# Patient Record
Sex: Male | Born: 1960 | Race: White | Hispanic: No | Marital: Single | Smoking: Former smoker
Health system: Southern US, Community
[De-identification: ages and names within clinical notes are randomized; demographics above are authoritative.]

## PROBLEM LIST (undated history)

## (undated) DIAGNOSIS — I639 Cerebral infarction, unspecified: Secondary | ICD-10-CM

## (undated) DIAGNOSIS — F32A Depression, unspecified: Secondary | ICD-10-CM

## (undated) DIAGNOSIS — G4700 Insomnia, unspecified: Secondary | ICD-10-CM

## (undated) DIAGNOSIS — N429 Disorder of prostate, unspecified: Secondary | ICD-10-CM

## (undated) DIAGNOSIS — A419 Sepsis, unspecified organism: Secondary | ICD-10-CM

## (undated) DIAGNOSIS — E871 Hypo-osmolality and hyponatremia: Secondary | ICD-10-CM

## (undated) DIAGNOSIS — J439 Emphysema, unspecified: Secondary | ICD-10-CM

## (undated) DIAGNOSIS — Z8619 Personal history of other infectious and parasitic diseases: Secondary | ICD-10-CM

## (undated) DIAGNOSIS — Z972 Presence of dental prosthetic device (complete) (partial): Secondary | ICD-10-CM

## (undated) DIAGNOSIS — J189 Pneumonia, unspecified organism: Secondary | ICD-10-CM

## (undated) DIAGNOSIS — I739 Peripheral vascular disease, unspecified: Secondary | ICD-10-CM

## (undated) DIAGNOSIS — J302 Other seasonal allergic rhinitis: Secondary | ICD-10-CM

## (undated) DIAGNOSIS — R569 Unspecified convulsions: Secondary | ICD-10-CM

## (undated) DIAGNOSIS — R636 Underweight: Secondary | ICD-10-CM

## (undated) DIAGNOSIS — F329 Major depressive disorder, single episode, unspecified: Secondary | ICD-10-CM

## (undated) DIAGNOSIS — I10 Essential (primary) hypertension: Secondary | ICD-10-CM

## (undated) DIAGNOSIS — G629 Polyneuropathy, unspecified: Secondary | ICD-10-CM

## (undated) DIAGNOSIS — Z22322 Carrier or suspected carrier of Methicillin resistant Staphylococcus aureus: Secondary | ICD-10-CM

## (undated) DIAGNOSIS — R278 Other lack of coordination: Secondary | ICD-10-CM

## (undated) HISTORY — DX: Emphysema, unspecified: J43.9

## (undated) HISTORY — DX: Personal history of other infectious and parasitic diseases: Z86.19

## (undated) HISTORY — DX: Major depressive disorder, single episode, unspecified: F32.9

## (undated) HISTORY — DX: Depression, unspecified: F32.A

## (undated) HISTORY — DX: Disorder of prostate, unspecified: N42.9

## (undated) HISTORY — DX: Unspecified convulsions: R56.9

## (undated) HISTORY — PX: VASCULAR SURGERY: SHX849

## (undated) HISTORY — PX: FRACTURE SURGERY: SHX138

## (undated) HISTORY — DX: Underweight: R63.6

## (undated) HISTORY — DX: Other seasonal allergic rhinitis: J30.2

## (undated) HISTORY — DX: Essential (primary) hypertension: I10

---

## 2008-01-26 ENCOUNTER — Ambulatory Visit: Payer: Self-pay | Admitting: Vascular Surgery

## 2008-03-06 ENCOUNTER — Emergency Department: Payer: Self-pay | Admitting: Emergency Medicine

## 2008-06-05 ENCOUNTER — Ambulatory Visit: Payer: Self-pay | Admitting: Internal Medicine

## 2008-06-07 ENCOUNTER — Ambulatory Visit: Payer: Self-pay | Admitting: Vascular Surgery

## 2008-09-04 ENCOUNTER — Ambulatory Visit: Payer: Self-pay | Admitting: Neurology

## 2008-11-07 ENCOUNTER — Ambulatory Visit: Payer: Self-pay | Admitting: Neurology

## 2011-05-30 ENCOUNTER — Inpatient Hospital Stay: Payer: Self-pay | Admitting: Internal Medicine

## 2011-09-18 ENCOUNTER — Ambulatory Visit: Payer: Self-pay | Admitting: Adult Health

## 2011-11-18 ENCOUNTER — Ambulatory Visit: Payer: Self-pay | Admitting: Internal Medicine

## 2011-11-18 ENCOUNTER — Inpatient Hospital Stay: Payer: Self-pay | Admitting: Internal Medicine

## 2011-11-18 LAB — CBC
MCH: 32.5 pg (ref 26.0–34.0)
MCV: 98 fL (ref 80–100)
Platelet: 185 10*3/uL (ref 150–440)
RBC: 4.41 10*6/uL (ref 4.40–5.90)
RDW: 15.1 % — ABNORMAL HIGH (ref 11.5–14.5)
WBC: 7.4 10*3/uL (ref 3.8–10.6)

## 2011-11-18 LAB — URINALYSIS, COMPLETE
Bacteria: NONE SEEN
Blood: NEGATIVE
Glucose,UR: NEGATIVE mg/dL (ref 0–75)
Ketone: NEGATIVE
Leukocyte Esterase: NEGATIVE
Nitrite: NEGATIVE
Ph: 7 (ref 4.5–8.0)
RBC,UR: <1 /HPF (ref 0–5)

## 2011-11-18 LAB — COMPREHENSIVE METABOLIC PANEL
Albumin: 3.9 g/dL (ref 3.4–5.0)
Alkaline Phosphatase: 109 U/L (ref 50–136)
BUN: 7 mg/dL (ref 7–18)
Bilirubin,Total: 0.9 mg/dL (ref 0.2–1.0)
Calcium, Total: 9.1 mg/dL (ref 8.5–10.1)
Creatinine: 0.9 mg/dL (ref 0.60–1.30)
Glucose: 104 mg/dL — ABNORMAL HIGH (ref 65–99)
Osmolality: 233 (ref 275–301)
Potassium: 2.7 mmol/L — ABNORMAL LOW (ref 3.5–5.1)
SGPT (ALT): 28 U/L
Sodium: 116 mmol/L — CL (ref 136–145)
Total Protein: 8.6 g/dL — ABNORMAL HIGH (ref 6.4–8.2)

## 2011-11-18 LAB — MAGNESIUM: Magnesium: 1.8 mg/dL

## 2011-11-19 LAB — CBC WITH DIFFERENTIAL/PLATELET
Basophil #: 0 10*3/uL (ref 0.0–0.1)
Basophil %: 0.4 %
Eosinophil %: 1.1 %
HCT: 36.8 % — ABNORMAL LOW (ref 40.0–52.0)
Lymphocyte #: 0.9 10*3/uL — ABNORMAL LOW (ref 1.0–3.6)
Lymphocyte %: 21.3 %
MCV: 98 fL (ref 80–100)
Monocyte %: 17.8 %
Neutrophil #: 2.6 10*3/uL (ref 1.4–6.5)
Neutrophil %: 59.4 %
Platelet: 164 10*3/uL (ref 150–440)
RBC: 3.78 10*6/uL — ABNORMAL LOW (ref 4.40–5.90)
RDW: 14.9 % — ABNORMAL HIGH (ref 11.5–14.5)
WBC: 4.3 10*3/uL (ref 3.8–10.6)

## 2011-11-19 LAB — COMPREHENSIVE METABOLIC PANEL
Albumin: 2.9 g/dL — ABNORMAL LOW (ref 3.4–5.0)
Alkaline Phosphatase: 80 U/L (ref 50–136)
Bilirubin,Total: 0.8 mg/dL (ref 0.2–1.0)
Calcium, Total: 8.1 mg/dL — ABNORMAL LOW (ref 8.5–10.1)
Co2: 23 mmol/L (ref 21–32)
EGFR (African American): >60
EGFR (Non-African Amer.): >60
Glucose: 109 mg/dL — ABNORMAL HIGH (ref 65–99)
Osmolality: 255 (ref 275–301)
SGOT(AST): 27 U/L (ref 15–37)
SGPT (ALT): 22 U/L
Sodium: 128 mmol/L — ABNORMAL LOW (ref 136–145)

## 2011-11-20 LAB — LIPID PANEL
Cholesterol: 68 mg/dL (ref 0–200)
Ldl Cholesterol, Calc: 28 mg/dL (ref 0–100)

## 2011-11-20 LAB — CBC WITH DIFFERENTIAL/PLATELET
Basophil #: 0 10*3/uL (ref 0.0–0.1)
Basophil %: 0.5 %
Eosinophil %: 2 %
HCT: 37.8 % — ABNORMAL LOW (ref 40.0–52.0)
HGB: 12.8 g/dL — ABNORMAL LOW (ref 13.0–18.0)
Lymphocyte %: 24.5 %
MCH: 32.9 pg (ref 26.0–34.0)
Monocyte %: 17.2 %
Neutrophil #: 2.6 10*3/uL (ref 1.4–6.5)
Platelet: 201 10*3/uL (ref 150–440)
RBC: 3.88 10*6/uL — ABNORMAL LOW (ref 4.40–5.90)
WBC: 4.6 10*3/uL (ref 3.8–10.6)

## 2011-11-20 LAB — BASIC METABOLIC PANEL
BUN: 1 mg/dL — ABNORMAL LOW (ref 7–18)
Calcium, Total: 8.5 mg/dL (ref 8.5–10.1)
Creatinine: 0.57 mg/dL — ABNORMAL LOW (ref 0.60–1.30)
EGFR (African American): >60
EGFR (Non-African Amer.): >60
Glucose: 82 mg/dL (ref 65–99)
Osmolality: 256 (ref 275–301)
Potassium: 4 mmol/L (ref 3.5–5.1)

## 2013-05-01 ENCOUNTER — Ambulatory Visit: Payer: Self-pay | Admitting: Internal Medicine

## 2013-09-07 HISTORY — PX: EYE SURGERY: SHX253

## 2013-10-02 ENCOUNTER — Ambulatory Visit: Payer: Self-pay

## 2014-02-15 ENCOUNTER — Ambulatory Visit: Payer: Self-pay | Admitting: Internal Medicine

## 2014-02-28 ENCOUNTER — Ambulatory Visit: Payer: Self-pay | Admitting: Ophthalmology

## 2014-02-28 DIAGNOSIS — I10 Essential (primary) hypertension: Secondary | ICD-10-CM

## 2014-02-28 DIAGNOSIS — Z01810 Encounter for preprocedural cardiovascular examination: Secondary | ICD-10-CM

## 2014-03-14 ENCOUNTER — Ambulatory Visit: Payer: Self-pay | Admitting: Ophthalmology

## 2014-03-21 ENCOUNTER — Ambulatory Visit: Payer: Self-pay | Admitting: Internal Medicine

## 2014-04-24 ENCOUNTER — Ambulatory Visit: Payer: Self-pay | Admitting: Internal Medicine

## 2014-05-15 ENCOUNTER — Ambulatory Visit: Payer: Self-pay | Admitting: Internal Medicine

## 2014-12-29 NOTE — Op Note (Signed)
PATIENT NAME:  Donald [name], Donald [name] MR#:  [id number] DATE OF BIRTH:  03-26-1961  DATE OF PROCEDURE:  03/14/2014  LOCATION: [facility]   PREOPERATIVE DIAGNOSIS:  Senile cataract, right eye with miotic pupil.  POSTOPERATIVE DIAGNOSIS:  Senile cataract, right eye with miotic pupil.  PROCEDURE:  Phacoemulsification with posterior chamber intraocular lens placement which required pupil stretching with the Graether pupil expansion device.   LENS IMPLANT:  ZCBOO 23.0-diopter.   ULTRASOUND TIME:   16 % of 52 seconds for CDE of 8.5.    SURGEON:  Wyonia Hough, MD  ANESTHESIA:  Retrobulbar block of Xylocaine and Bupivacaine and Vitrase.   COMPLICATIONS:  None.  DESCRIPTION OF PROCEDURE:  The patient was identified in the holding room, transported to the operating room, and placed in the supine position.  The right eye was identified as the operative eye and a retrobulbar block was administered under intravenous sedation.  It was then prepped and draped in the usual sterile ophthalmic fashion.  The eye was inspected and the pupil was noted to be between 4 millimeters with maximal pharmacologic dilation.  A 1 millimeter side-port incision was made at the 1:30 position.  The anterior chamber was filled with Viscoat.  A 2.4 millimeter near-clear corneal incision was made at the 10:30 position.  A Graether pupil expander was then placed through the main incision and into the anterior chamber of the eye.  The edge of the iris was secured on the lip of the pupil expander and it was released, thereby expanding the pupil to approximately 8 millimeters for completion of the cataract surgery.  A cystotome and capsulorrhexis forceps were used to make a curvilinear capsulorrhexis.   Balanced salt solution was used to hydrodissect and hydrodelineate the lens nucleus.  Phacoemulsification was used in stop and chop fashion to remove the lens, nucleus and epinucleus.  The remaining cortex was  aspirated using the irrigation aspiration handpiece.  Additional Provisc was placed into the eye to distend the capsular bag for lens placement.  A ZCBOO 23.0-diopter lens was then injected into the capsular bag.  The remaining viscoelastic was aspirated from the capsular bag and the anterior chamber. The pupil expanding ring was removed using a Kuglen hook.  The anterior chamber was filled with balanced salt solution to inflate to a physiologic pressure.    The wounds were hydrated with balanced salt solution.  Miostat was placed into the anterior chamber. 0.1 mL of cefuroxime 10 mg/mL were injected into the anterior chamber for a dose of 1 mg of intracameral antibiotic at the completion of the case.  The eye was noted to have a physiologic pressure without wound leak. Topical Vigamox drops and Maxitrol ointment were applied to the eye.  The eye was shielded.   The patient was taken to the recovery room in stable condition.  ____________________________ Wyonia Hough, MD crb:aj D: 03/14/2014 13:29:37 ET T: 03/15/2014 04:16:39 ET JOB#: [id number]  cc: Wyonia Hough, MD, <Dictator> Leandrew Koyanagi MD ELECTRONICALLY SIGNED 03/21/2014 12:41

## 2014-12-30 NOTE — H&P (Signed)
PATIENT NAME:  Donald [name], Donald [name] MR#:  [id number] DATE OF BIRTH:  [birth year]  DATE OF ADMISSION:  11/18/2011  PRIMARY CARE PHYSICIAN: [facility]   HISTORY OF PRESENT ILLNESS: The patient is a 54 year old Caucasian male with past medical history significant for history of alcohol abuse, history of hyponatremia in the past, history of alcohol intoxication, and admission for possible seizure in September 2012 who presented back to the hospital with complaints of nausea and vomiting intermittently as well as abnormal labs. According to the patient, he was not doing well over the past few days. He has been having intermittent nausea and vomiting as well as diarrhea a few days ago. He has been coughing and feeling sick, having intermittent chills. He was seen by his primary care physician yesterday at [facility] who ordered lab studies and sent him to get chest x-ray. He had chest x-ray done at [facility] and after he returned back home he was called by his primary care physician's office and was told that his labs were abnormal showing low potassium as well as low sodium levels and the patient was advised to come to the Emergency Room for further evaluation. In the Emergency Room, the patient's sodium level is as low as 116 and hospitalist services were contacted for admission.   PAST MEDICAL HISTORY:  1. History of alcohol intoxication, questionable seizures in September 2012. 2. History of hyponatremia at the same admission with sodium levels as low as 128.  3. History of hypertension. 4. Tobacco abuse. 5. Peripheral vascular disease. 6. History of atherosclerotic disease of lower extremities status post multiple stent placements as well as TPA of aorta, bilateral common iliac artery, right external iliac artery status post stent placement in these vessels as well.  7. Degenerative disk disease in L4-L5.   MEDICATIONS:  1. The patient is not sure about his two blood  pressure medications he is supposed to take.  2. Amoxicillin, unknown dose, 3 times daily.  3. Omeprazole, unknown dose or frequency.  4. Aspirin 81 mg p.o. daily.   SOCIAL HISTORY: Lives alone. Is on disability due to degenerative disk disease and severe peripheral vascular disease. Admits to smoking approximately 1 pack per day for the past 30 years. Admits of drinking at least a 6 pack of beer a day. Last use was three days ago. The patient states that he is trying to cut down with his smoking. He is now down to one-third of pack of cigarettes a day.   FAMILY HISTORY: The patient's family history is positive for hypertension in the patient's mother, diabetes in the patient's grandparents. No history of coronary artery disease or CVA.   ALLERGIES: No known drug allergies   REVIEW OF SYSTEMS: Positive for pains in his back, some blurring of vision for which he uses reading glasses, sinus congestion, yellow phlegm which he started having today. In the past he has been coughing without any phlegm production and now since he started on antibiotic he started producing some sputum. He admits of wheezing as well as dyspnea on exertion. Also, intermittent nausea and vomiting as well as diarrhea three days ago. He admits of having some trouble with urination. He has difficulty expelling urine. Denies any fevers, chills, fatigue, weakness, weight loss or gain. In regards to eyes, denies any double vision, glaucoma, or cataracts. ENT: Denies any tinnitus, allergies, epistaxis, sinus pain, dentures, difficulty swallowing. RESPIRATORY: Admits of wheezes. Denies any hemoptysis, asthma, or COPD. CARDIOVASCULAR: Denies chest  pains, orthopnea, edema, arrhythmias, palpitations, or syncope. GASTROINTESTINAL: Denies any diarrhea recently over the past two days. No abdominal pain. No rectal bleeding. No change in bowel habits. GENITOURINARY: Denies dysuria, hematuria, frequency, or incontinence. ENDOCRINOLOGY: Denies any  polydipsia, nocturia, thyroid problems, heat or cold intolerance, or thirst. HEMATOLOGIC: Denies any anemia, bruising, bleeding, or swollen glands. SKIN: Denies any acne, rashes, lesions, or change in moles. MUSCULOSKELETAL: Denies arthritis, cramps, swelling, or gout. NEUROLOGIC: No numbness, epilepsy, or tremor. PSYCHIATRIC: Denies anxiety, insomnia, or depression.   PHYSICAL EXAMINATION:   VITAL SIGNS: On arrival to hospital, temperature 97.5, pulse 102, respiration rate 20, blood pressure 143/88, saturation 99% on room air.   GENERAL: This is a well developed, well nourished Caucasian male in no significant distress somewhat pale and sick looking sitting on the stretcher.   HEENT: Pupils are equal and reactive to light. Extraocular movements intact. No icterus or conjunctivitis. Has normal hearing. No pharyngeal erythema. Mucosa is moist.   NECK: Neck did not reveal any masses. Supple, nontender. Thyroid not enlarged. No adenopathy. No JVD or carotid bruits bilaterally. Full range of motion.   LUNGS: Clear to auscultation on the left; crackles as well as rales and rhonchi were noted on the left especially posteriorly in lower part of the lungs. Few rhonchi bilaterally was noted as well. No wheezing. No labored inspirations, increased effort, dullness to percussion, or overt respiratory distress.   CARDIOVASCULAR: S1, S2 appreciated. No murmurs, gallops, or rubs were noted. PMI not lateralized. Chest is nontender to palpation. Diminished pedal pulses. No lower extremity edema, calf tenderness, or cyanosis noted.   ABDOMEN: Soft. No significant tenderness. No hepatosplenomegaly or masses were noted.   RECTAL: Deferred.   MUSCULOSKELETAL: Muscle strength able to move all extremities. No significant degenerative joint disease or kyphosis. Gait not tested.   SKIN: Skin did not reveal any rashes, lesions, erythema, nodularity, or induration. It was warm and dry to palpation.   LYMPH: No  adenopathy in the cervical region.   NEUROLOGICAL: Cranial nerves grossly intact. Sensory is intact. No dysarthria or aphasia. The patient is alert, oriented to time, person, and place, cooperative. Memory is good.   PSYCHIATRIC: No significant confusion, agitation, or depression noted.   LABORATORY, DIAGNOSTIC, AND RADIOLOGICAL DATA: Sodium 116, potassium 2.7, glucose 104, otherwise unremarkable BMP. The patient's liver enzymes showed total protein 8.6, otherwise liver enzymes unremarkable. CBC within normal limits. Urinalysis showed straw clear urine, negative for glucose, bilirubin, or ketones, specific gravity 1.002, pH of 7.0, negative for blood, protein, nitrites, or leukocyte esterase, less than 1 red blood cell, no white blood cells, no bacteria or epithelial cells were noted. No EKG was done.     ASSESSMENT AND PLAN:  1. Hyponatremia very likely due to intermittent nausea and vomiting as well as dehydration due to diarrhea. Admit patient to medical floor. Continue him on IV fluids. Check sodium level in the morning.   2. Intermittent nausea, vomiting, questionable alcoholic versus nonsteroidal anti-inflammatory medications such as aspirin gastritis. We will be holding the patient's aspirin. Will continue the patient on PPIs for now. Will follow and will start him on a soft diet. Advance to regular diet as tolerated.  3. Diarrhea, seems to be resolved. Last diarrheal stool was two days ago. Will get stool cultures if needed. I am concerned about possible exophrenic dysfunction of his pancreas due to longstanding history of alcohol abuse.  4. History of alcohol abuse. Last drink was three days ago. Will watch patient and initiate  CIWA scale if needed. Check magnesium level.  5. Tobacco abuse. Discussed with the patient for six minutes. Nicotine replacement therapy will be initiated while he is in the hospital.  6. Acute bronchitis. Start patient on Levaquin. Get sputum cultures.  7. Chronic  obstructive pulmonary disease exacerbation. Continue DuoNebs.  8. Suspected benign prostatic hypertrophy. Start Flomax.  9. History of hypertension. Will start patient on Flomax. Will also initiate metoprolol.   TIME SPENT: 50 minutes.   ____________________________ Theodoro Grist, MD rv:drc D: 11/18/2011 21:47:34 ET T: 11/19/2011 07:57:13 ET JOB#: [id number]  cc: Theodoro Grist, MD, <Dictator> [facility] Wiggins MD ELECTRONICALLY SIGNED 11/28/2011 10:07

## 2014-12-30 NOTE — Discharge Summary (Signed)
PATIENT NAME:  Donald [name], Donald [name] MR#:  [id number] DATE OF BIRTH:  [birth year]  DATE OF ADMISSION:  11/18/2011 DATE OF DISCHARGE:  11/20/2011  PRIMARY CARE PHYSICIAN: [facility]   REASON FOR ADMISSION: Nausea, vomiting, diarrhea, and abnormal blood work.   DISCHARGE DIAGNOSES:  1. Nausea, vomiting, and diarrhea likely secondary to acute viral gastroenteritis. 2. Acute viral gastroenteritis.  3. Hyponatremia felt to be multifactorial from hypovolemia caused by vomiting and dehydration. Vomiting, diarrhea, and dehydration plus beer potomania plus hydrochlorothiazide induced.  4. Hypokalemia.  5. Probable chronic obstructive pulmonary disease based on smoking history and suggested by chest x-ray findings.  6. History of hypertension.  7. History of tobacco abuse.  8. History of alcohol abuse.  9. History of peripheral vascular disease with stents in the iliac vessels 2009.  10. History of acute alcohol intoxication and possible seizures.   CONSULTATIONS: None.   PROCEDURES: None.   PERTINENT LABORATORY DATA: Serum sodium 116 with potassium 2.7 on admission. Serum sodium 130, potassium 4 on the day of discharge.   LFTs normal on admission except for total protein of 8.6.   CBC normal on admission.   Urinalysis on admission with specific gravity 1.002, negative nitrite, leukocyte esterase. No  WBCs and no bacteria seen.   BRIEF HISTORY /HOSPITAL COURSE: The patient is a 54 year old male with past medical history of alcohol abuse, hospitalization for acute alcohol intoxication and possible related seizures, hyponatremia from alcohol abuse, hypertension, peripheral vascular disease, and tobacco abuse who presents to the Emergency Department with complaints of nausea, vomiting, and diarrhea and was noted to have abnormal labs as an outpatient, noted to be hyponatremic, and thereafter was sent to the hospital for further evaluation and management. Please see dictated admission History and  Physical for pertinent details surrounding the onset of this hospitalization. Please see below for further details. 1. Nausea, vomiting, and diarrhea: Felt to be secondary to acute viral gastroenteritis for which the patient was admitted to the hospital and placed on supportive measures and hydrated with IV fluids and placed on antiemetics. His diarrhea had eventually resolved by the time he was admitted and did not recur. Therefore, stool studies were not obtained. He was initially started on a clear liquid diet. With supportive care and conservative measures he had resolution of his vomiting and nausea during his hospital course. Thereafter his diet was slowly advanced and he is tolerating a solid diet well at the time of discharge without any nausea, vomiting, diarrhea, or abdominal pain.  2. Hyponatremia: Felt to be multifactorial from hypovolemia caused by vomiting, diarrhea, and dehydration, plus beer potomania, plus HCTZ. HCTZ has been discontinued. He was hydrated gently with IV normal saline and thereafter serum sodium level has significantly improved. He was advised to undergo alcohol detox and gradually wean himself off alcohol with goal for eventual cessation and to maintain sobriety, but he does not appear motivated to stop drinking at this time and stated to me that in all likelihood he will continue consuming alcohol when he returns home. He was without any suicidal or homicidal ideation. Serum sodium levels have improved with the measures mentioned above and this will need to be close monitored as an outpatient.  3. Hypokalemia:  Likely wasting from alcohol abuse plus vomiting and diarrhea and also HCTZ-induced. After potassium chloride supplementation his serum potassium level has normalized. His serum magnesium level is within normal limits. 4. Probable chronic obstructive pulmonary disease based off smoking history and suggested by outpatient chest x-ray  findings: Oxygen saturations are normal  on room air. The patient denied any shortness of breath or wheezing. He has been prescribed p.r.n. albuterol metered dose inhaler and I have offered him a referral to pulmonology as an outpatient for further testing and work-up and PFTs, but he states to me that he is not interested in outpatient pulmonology evaluation or referral at this time as he stated that he would not be able to afford this given his current financial status. He was advised that if he develops any worsening shortness of breath, cough, fever, chills, or wheezing that he should let his primary care physician know immediately. For now we will defer referring him to pulmonology as an outpatient to his primary care physician as per the patient's request.  5. Hypertension:  He did have some hypertensive episodes noted during this hospitalization for which his antihypertensive regimen has been adjusted. HCTZ has been discontinued given hyponatremia and hypokalemia. The patient was not volume overloaded and therefore requires diuretic at this time. If he continues to drink alcohol, which he stated to me that he would, and continues to take HCTZ he will likely be readmitted with hyponatremia.  Therefore, this medication has been discontinued for now. He was started on metoprolol instead.  He was also maintained on lisinopril and blood pressure is well controlled on the date of discharge with these two medications. He will need close outpatient followup in regards to blood pressure management. 6. Tobacco abuse:  The patient was counseled on the importance of smoking cessation.  7. Alcohol abuse: The patient was counseled again to be detoxed off of alcohol and thereafter abstain from alcohol abuse completely and to maintain sobriety.  However, as above he does not appear to be motivated at this time.  He was maintained on thiamine, folate, and multivitamin therapy along with withdrawal and DT prophylaxis with CIWA during this hospitalization.  On the  day of discharge he was non-tremulous and non-agitated and without any asterixis. He was also given thiamine, folate, and multivitamin during this hospitalization.  8. History of peripheral vascular disease with stents to the iliac vessels 2010 - Patient to continue Aspirin therapy.  LDL is at goal.  He denied any claudication or lower extremity pain at rest or with activity and stated that he was not interested in followup with vascular surgery as an outpatient.   On 11/20/2011 the patient was hemodynamically stable and without any nausea, vomiting, or diarrhea. Serum sodium has significantly improved and is almost back to normal, and serum potassium level has normalized. The patient was adamant about being discharged home on 11/20/2011 and thereafter he was discharged and was advised to maintain close outpatient followup, to which the patient was agreeable.   DISCHARGE DISPOSITION: Home.   DISCHARGE CONDITION: Improved, stable.  DISCHARGE ACTIVITY:  As tolerated.    DISCHARGE DIET: Low fat, low cholesterol.   DISCHARGE MEDICATIONS: 1. Aspirin 81 mg daily.  2. Metoprolol 25 mg p.o. every 12 hours.  3. Lisinopril 10 mg p.o. daily. 4. Albuterol metered dose inhaler 1 to 2 puffs inhaled every 4 to 6 hours p.r.n.  5. Multivitamin 1 tablet p.o. daily.   DISCHARGE INSTRUCTIONS:  1. Take medication as prescribed.  2. Return to the Emergency Department for numbness, tingling, weakness, or feeling faint, for slurred speech or confusion.   FOLLOWUP INSTRUCTIONS:  Follow up with [facility] within 1 to 2 weeks. The patient needs repeat BMP within one week and repeat blood pressure check within  one week.    TIME SPENT ON DISCHARGE:  Greater than 30 minutes.    ____________________________ Romie Jumper, MD knl:bjt D: 11/24/2011 21:56:47 ET T: 11/25/2011 11:42:59 ET JOB#: [id number]  cc: Romie Jumper, MD, <Dictator> [facility] Romie Jumper MD ELECTRONICALLY SIGNED  11/29/2011 16:33

## 2015-01-10 ENCOUNTER — Other Ambulatory Visit: Payer: Self-pay

## 2015-01-23 ENCOUNTER — Other Ambulatory Visit: Payer: Self-pay

## 2015-02-06 ENCOUNTER — Ambulatory Visit: Payer: Self-pay | Admitting: Ophthalmology

## 2015-02-20 ENCOUNTER — Ambulatory Visit: Payer: Self-pay | Admitting: Internal Medicine

## 2015-03-13 ENCOUNTER — Ambulatory Visit: Payer: Self-pay

## 2015-03-20 ENCOUNTER — Ambulatory Visit: Payer: Self-pay

## 2015-06-20 ENCOUNTER — Encounter: Payer: Self-pay | Admitting: Family Medicine

## 2015-06-20 ENCOUNTER — Ambulatory Visit (INDEPENDENT_AMBULATORY_CARE_PROVIDER_SITE_OTHER): Payer: Medicaid Other | Admitting: Family Medicine

## 2015-06-20 VITALS — BP 123/88 | HR 89 | Temp 98.2°F | Resp 16 | Ht 69.0 in | Wt 112.8 lb

## 2015-06-20 DIAGNOSIS — J432 Centrilobular emphysema: Secondary | ICD-10-CM | POA: Insufficient documentation

## 2015-06-20 DIAGNOSIS — Z720 Tobacco use: Secondary | ICD-10-CM

## 2015-06-20 DIAGNOSIS — N400 Enlarged prostate without lower urinary tract symptoms: Secondary | ICD-10-CM | POA: Diagnosis not present

## 2015-06-20 DIAGNOSIS — I10 Essential (primary) hypertension: Secondary | ICD-10-CM | POA: Diagnosis not present

## 2015-06-20 DIAGNOSIS — Z87891 Personal history of nicotine dependence: Secondary | ICD-10-CM | POA: Insufficient documentation

## 2015-06-20 DIAGNOSIS — I739 Peripheral vascular disease, unspecified: Secondary | ICD-10-CM | POA: Insufficient documentation

## 2015-06-20 DIAGNOSIS — F17200 Nicotine dependence, unspecified, uncomplicated: Secondary | ICD-10-CM

## 2015-06-20 DIAGNOSIS — J449 Chronic obstructive pulmonary disease, unspecified: Secondary | ICD-10-CM | POA: Diagnosis not present

## 2015-06-20 MED ORDER — METOPROLOL SUCCINATE ER 50 MG PO TB24: 50.0000 mg | ORAL_TABLET | Freq: Every day | ORAL | Status: DC

## 2015-06-20 MED ORDER — AMLODIPINE BESYLATE 5 MG PO TABS: 5.0000 mg | ORAL_TABLET | Freq: Every day | ORAL | Status: DC

## 2015-06-20 MED ORDER — SILODOSIN 4 MG PO CAPS: 4.0000 mg | ORAL_CAPSULE | Freq: Every day | ORAL | Status: DC

## 2015-06-20 NOTE — Progress Notes (Signed)
Date:  06/20/2015   Name:  Donald [name].   DOB:  1961/01/06   MRN:  [id number]  PCP:  No primary care provider on file.    Chief Complaint: Hypertension   History of Present Illness:  This is a 54 y.o. male former patient at [facility] now on Medicaid went to refill meds and [facility] MD not registered with Medicaid so pharmacy could not fill. Planning to make appt with NP here next month. Hx HTN well controlled on current meds and BPH well controlled on Rapaflo (has increased problems urinating if doesn't take). 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 Denies known CAD or carotid dz. Has COPD per records on albuterol MDI which he uses only occasionally. Still smoking though told to quit. Falls intermittently due to BLE nerve damage.  Review of Systems:  Review of Systems  Constitutional: Negative for fever.  Respiratory: Negative for cough and shortness of breath.   Cardiovascular: Negative for chest pain and leg swelling.  Genitourinary: Negative for difficulty urinating.  Neurological: Negative for light-headedness.    Patient Active Problem List   Diagnosis Date Noted  . Hypertension 06/20/2015  . BPH (benign prostatic hypertrophy) 06/20/2015  . PAD (peripheral artery disease) ([place]) 06/20/2015  . Smoker 06/20/2015  . COPD (chronic obstructive pulmonary disease) ([place]) 06/20/2015    Prior to Admission medications   Medication Sig Start Date End Date Taking? Authorizing Provider  albuterol (PROVENTIL HFA;VENTOLIN HFA) 108 (90 BASE) MCG/ACT inhaler Inhale into the lungs every 6 (six) hours as needed for wheezing or shortness of breath.   Yes Historical Provider, MD  amLODipine (NORVASC) 5 MG tablet Take 1 tablet (5 mg total) by mouth daily. 06/20/15  Yes Adline Potter, MD  metoprolol tartrate (LOPRESSOR) 25 MG tablet Take 25 mg by mouth 2 (two) times daily.   Yes Historical Provider, MD  silodosin (RAPAFLO) 4 MG CAPS capsule Take  1 capsule (4 mg total) by mouth daily with breakfast. 06/20/15  Yes Adline Potter, MD  metoprolol succinate (TOPROL-XL) 50 MG 24 hr tablet Take 1 tablet (50 mg total) by mouth daily. 06/20/15   Adline Potter, MD    No Known Allergies  Past Surgical History  Procedure Laterality Date  . None      Social History  Substance Use Topics  . Smoking status: Current Every Day Smoker -- 0.50 packs/day    Types: Cigarettes  . Smokeless tobacco: None  . Alcohol Use: 0.0 oz/week    0 Standard drinks or equivalent per week    Family History  Problem Relation Age of Onset  . Ulcers Father     bleeding    Medication list has been reviewed and updated.  Physical Examination: BP 123/88 mmHg  Pulse 89  Temp(Src) 98.2 F (36.8 C) (Oral)  Resp 16  Ht 5\' 9"  (1.753 m)  Wt 112 lb 12.8 oz (51.166 kg)  BMI 16.65 kg/m2  Physical Exam  Constitutional: He appears well-developed and well-nourished.  Neck: Neck supple. No thyromegaly present.  Cardiovascular: Normal rate, regular rhythm and normal heart sounds.   Pulmonary/Chest: Breath sounds normal.  Abdominal: Soft. He exhibits no distension and no mass. There is no tenderness.  Musculoskeletal: He exhibits no edema.  Lymphadenopathy:    He has no cervical adenopathy.  Neurological: He is alert. Coordination normal.  Skin: Skin is warm and dry.  Psychiatric: He has a normal mood and affect. His behavior  is normal.    Assessment and Plan:  1. Essential hypertension Well controlled on current regimen, change metoprolol tartrate to succinate for simplified dosing - amLODipine (NORVASC) 5 MG tablet; Take 1 tablet (5 mg total) by mouth daily.  Dispense: 90 tablet; Refill: 3 - metoprolol succinate (TOPROL-XL) 50 MG 24 hr tablet; Take 1 tablet (50 mg total) by mouth daily.  Dispense: 90 tablet; Refill: 3  2. BPH (benign prostatic hypertrophy) Well controlled on current regimen - silodosin (RAPAFLO) 4 MG CAPS capsule; Take 1 capsule (4 mg  total) by mouth daily with breakfast.  Dispense: 90 capsule; Refill: 3  3. PAD (peripheral artery disease) ([place]) S/p multiple BLE stents, consider asa if no contraindication next visit  4. Chronic obstructive pulmonary disease, unspecified COPD type ([place]) Well controlled on prn albuterol MDI  5. Smoker Advised cessation  Return in about 4 weeks (around 07/18/2015).  Satira Anis. [facility]  06/20/2015

## 2015-06-26 ENCOUNTER — Other Ambulatory Visit: Payer: Self-pay

## 2015-07-03 ENCOUNTER — Ambulatory Visit: Payer: Self-pay | Admitting: Internal Medicine

## 2015-07-29 ENCOUNTER — Ambulatory Visit (INDEPENDENT_AMBULATORY_CARE_PROVIDER_SITE_OTHER): Payer: Medicaid Other | Admitting: Family Medicine

## 2015-07-29 ENCOUNTER — Encounter: Payer: Self-pay | Admitting: Family Medicine

## 2015-07-29 VITALS — BP 113/80 | HR 88 | Temp 97.9°F | Resp 16 | Ht 69.0 in | Wt 115.4 lb

## 2015-07-29 DIAGNOSIS — F17200 Nicotine dependence, unspecified, uncomplicated: Secondary | ICD-10-CM

## 2015-07-29 DIAGNOSIS — I10 Essential (primary) hypertension: Secondary | ICD-10-CM

## 2015-07-29 DIAGNOSIS — I739 Peripheral vascular disease, unspecified: Secondary | ICD-10-CM

## 2015-07-29 DIAGNOSIS — G6289 Other specified polyneuropathies: Secondary | ICD-10-CM

## 2015-07-29 DIAGNOSIS — N400 Enlarged prostate without lower urinary tract symptoms: Secondary | ICD-10-CM | POA: Diagnosis not present

## 2015-07-29 DIAGNOSIS — G629 Polyneuropathy, unspecified: Secondary | ICD-10-CM | POA: Insufficient documentation

## 2015-07-29 DIAGNOSIS — J449 Chronic obstructive pulmonary disease, unspecified: Secondary | ICD-10-CM

## 2015-07-29 DIAGNOSIS — Z720 Tobacco use: Secondary | ICD-10-CM | POA: Diagnosis not present

## 2015-07-29 MED ORDER — SILODOSIN 4 MG PO CAPS: 4.0000 mg | ORAL_CAPSULE | Freq: Every day | ORAL | Status: DC

## 2015-07-29 MED ORDER — ASPIRIN 81 MG PO TABS: 81.0000 mg | ORAL_TABLET | Freq: Every day | ORAL | Status: DC

## 2015-07-29 MED ORDER — TAMSULOSIN HCL 0.4 MG PO CAPS: 0.4000 mg | ORAL_CAPSULE | Freq: Every day | ORAL | Status: DC

## 2015-07-29 NOTE — Patient Instructions (Signed)
Please seek immediate medical attention if you develop shortness of breath not relieve by inhaler, chest pain/tightness, fever > 103 F or other concerning symptoms.   Your goal blood pressure is 140/90. Work on low salt/sodium diet - goal <1.5gm (1,500mg ) per day. Eat a diet high in fruits/vegetables and whole grains.  Look into mediterranean and DASH diet. Goal activity is 136min/wk of moderate intensity exercise.  This can be split into 30 minute chunks.  If you are not at this level, you can start with smaller 10-15 min increments and slowly build up activity. Look at [facility]org for more resources  Please seek immediate medical attention at ER or Urgent Care if you develop: Chest pain, pressure or tightness. Shortness of breath accompanied by nausea or diaphoresis Visual changes Numbness or tingling on one side of the body Facial droop Altered mental status Or any concerning symptoms.

## 2015-07-29 NOTE — Assessment & Plan Note (Addendum)
Encouraged smoking cessation today. Referred to [place] [facility]. Offered nicotine patches, patient refused. >10 minutes smoking cessation counseling provided today.

## 2015-07-29 NOTE — Assessment & Plan Note (Signed)
2/2 PAD?  Pt states he was getting injections in back. Will review previous records to determine how best to treat.  Consider gabapentin in future.

## 2015-07-29 NOTE — Assessment & Plan Note (Signed)
Check lipid panel today. Recommend starting baby aspirin. Encouraged smoking cessation.  After reading records, refer to [facility] V[facility] as necessary.

## 2015-07-29 NOTE — Assessment & Plan Note (Signed)
Spirometry done today. Pt has refills on inhalers.  Alarm symptoms reviewed. RTC 3 mos.

## 2015-07-29 NOTE — Assessment & Plan Note (Signed)
Controlled. Continue current medications. CMP ordered. DASH diet reviewed. Smoking cessation encouarged. RTC 3 mos.

## 2015-07-29 NOTE — Assessment & Plan Note (Addendum)
Change to Flomax due to cost. Alarm symptoms reviewed. Consider referral to urology if symptoms worsen.

## 2015-07-29 NOTE — Progress Notes (Signed)
Subjective:    Patient ID: Donald Handy., male    DOB: Jul 31, 1961, 54 y.o.   MRN: [id number]  HPI: Donald Benak. is a 54 y.o. male presenting on 07/29/2015 for Establish Care   HPI  Pt presents for a visit to officially establish care today. He saw Dr. Vicente Masson for a sick visit previously.  Pt presents to establish care today. Previous care provider was [facility].  It has been 2 mossince  his last PCP visit. Records from previous provider will be requested and reviewed. Current medical problems include:  Emphysema: Diagnosed 3 years ago. Uses albuterol inhaler occasionally. Current everyday smoker. Has tried to quit smoking in the past. Has cut back.  Hypertension: Taking amlodipine and metoprolol. Diagnosed several years ago. No HA, visual changes, or chest pain. Does not check BP at home.  BPH: Takes Rapaflo to help with urinary symptoms. Wakes up in the middle of night 1-2 times per day. Has trouble voiding unless he takes rapaflo. PAD: 7 stents in his legs for PAD. 3 in each leg and a kidney. Does not take baby aspirin. Intermittent claudication. 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   Health maintenance:  Never had a colonoscopy. Would like one. Smoker- 1/2 pack per day for 30 years.    Past Medical History  Diagnosis Date  . History of chicken pox   . Prostate disease   . Emphysema lung ([place])   . Seasonal allergies   . Essential hypertension   . Depression     Current Outpatient Prescriptions on File Prior to Visit  Medication Sig  . albuterol (PROVENTIL HFA;VENTOLIN HFA) 108 (90 BASE) MCG/ACT inhaler Inhale into the lungs every 6 (six) hours as needed for wheezing or shortness of breath.  Marland Kitchen amLODipine (NORVASC) 5 MG tablet Take 1 tablet (5 mg total) by mouth daily.  . metoprolol succinate (TOPROL-XL) 50 MG 24 hr tablet Take 1 tablet (50 mg total) by mouth daily.   No current facility-administered medications on file prior to visit.    Review of Systems    Constitutional: Negative for fever and chills.  Respiratory: Negative for chest tightness, shortness of breath and wheezing.   Cardiovascular: Negative for chest pain, palpitations and leg swelling.  Gastrointestinal: Negative.   Endocrine: Negative for cold intolerance, heat intolerance, polydipsia, polyphagia and polyuria.  Genitourinary: Positive for frequency and difficulty urinating. Negative for flank pain.  Musculoskeletal: Positive for myalgias (claudication with walking. ).  Neurological: Positive for numbness. Negative for dizziness, light-headedness and headaches.  Psychiatric/Behavioral: Negative.    Per HPI unless specifically indicated above     Objective:    BP 113/80 mmHg  Pulse 88  Temp(Src) 97.9 F (36.6 C) (Oral)  Resp 16  Ht '5\' 9"'  (1.753 m)  Wt 115 lb 6.4 oz (52.345 kg)  BMI 17.03 kg/m2  Wt Readings from Last 3 Encounters:  07/29/15 115 lb 6.4 oz (52.345 kg)  06/20/15 112 lb 12.8 oz (51.166 kg)    Physical Exam  Constitutional: He is oriented to person, place, and time. He appears well-developed and well-nourished. No distress.  Neck: Normal range of motion. Neck supple. No thyromegaly present.  Cardiovascular: Normal rate and regular rhythm.  Exam reveals no gallop and no friction rub.   No murmur heard. Pulses:      Dorsalis pedis pulses are 1+ on the right side, and 1+ on the left side.       Posterior tibial pulses are 1+ on  the right side, and 1+ on the left side.  Pulmonary/Chest: Effort normal and breath sounds normal.  Abdominal: Soft. Bowel sounds are normal. There is no tenderness. There is no rebound.  Musculoskeletal: Normal range of motion. He exhibits no edema or tenderness.  Lymphadenopathy:    He has no cervical adenopathy.  Neurological: He is alert and oriented to person, place, and time.  Skin: Skin is warm and dry. He is not diaphoretic.   Results for orders placed or performed in visit on 11/18/11  Comprehensive metabolic panel   Result Value Ref Range   Glucose 104 (H) 65-99 mg/dL   BUN 7 7-18 mg/dL   Creatinine 0.90 0.60-1.30 mg/dL   Sodium 116 (LL) 136-145 mmol/L   Potassium 2.7 (L) 3.5-5.1 mmol/L   Chloride 75 (L) 98-107 mmol/L   Co2 28 21-32 mmol/L   Calcium, Total 9.1 8.5-10.1 mg/dL   SGOT(AST) 33 15-37 Unit/L   SGPT (ALT) 28 U/L   Alkaline Phosphatase 109 50-136 Unit/L   Albumin 3.9 3.4-5.0 g/dL   Total Protein 8.6 (H) 6.4-8.2 g/dL   Bilirubin,Total 0.9 0.2-1.0 mg/dL   Osmolality 233 275-301   Anion Gap 13 7-16   EGFR (African American) >60 >44m/min   EGFR (Non-African Amer.) >60 >697mmin  CBC  Result Value Ref Range   WBC 7.4 3.8-10.6 x10 3/mm 3   RBC 4.41 4.40-5.90 x10 6/mm 3   HGB 14.4 13.0-18.0 g/dL   HCT 43.2 40.0-52.0 %   MCV 98 80-100 fL   MCH 32.5 26.0-34.0 pg   MCHC 33.2 32.0-36.0 g/dL   RDW 15.1 (H) 11.5-14.5 %   Platelet 185 150-440 x10 3/mm 3  Urinalysis, Complete  Result Value Ref Range   Color - urine Straw    Clarity - urine Clear    Glucose,UR Negative 0-75 mg/dL   Bilirubin,UR Negative NEGATIVE   Ketone Negative NEGATIVE   Specific Gravity 1.002 1.003-1.030   Blood Negative NEGATIVE   Ph 7.0 4.5-8.0   Protein Negative NEGATIVE   Nitrite Negative NEGATIVE   Leukocyte Esterase Negative NEGATIVE   RBC,UR <1 /HPF 0-5 /HPF   WBC UR NONE SEEN 0-5 /HPF   Bacteria NONE SEEN NONE SEEN   Squamous Epithelial NONE SEEN   Magnesium  Result Value Ref Range   Magnesium 1.8 mg/dL  CBC with Differential/Platelet  Result Value Ref Range   WBC 4.3 3.8-10.6 x10 3/mm 3   RBC 3.78 (L) 4.40-5.90 x10 6/mm 3   HGB 12.4 (L) 13.0-18.0 g/dL   HCT 36.8 (L) 40.0-52.0 %   MCV 98 80-100 fL   MCH 32.8 26.0-34.0 pg   MCHC 33.6 32.0-36.0 g/dL   RDW 14.9 (H) 11.5-14.5 %   Platelet 164 150-440 x10 3/mm 3   Neutrophil % 59.4 %   Lymphocyte % 21.3 %   Monocyte % 17.8 %   Eosinophil % 1.1 %   Basophil % 0.4 %   Neutrophil # 2.6 1.4-6.5 x10 3/mm 3   Lymphocyte # 0.9 (L) 1.0-3.6 x10 3/mm 3    Monocyte # 0.8 (H) 0.0-0.7 x10 3/mm 3   Eosinophil # 0.0 0.0-0.7 x10 3/mm 3   Basophil # 0.0 0.0-0.1 x10 3/mm 3  Comprehensive metabolic panel  Result Value Ref Range   Glucose 109 (H) 65-99 mg/dL   BUN 5 (L) 7-18 mg/dL   Creatinine 0.61 0.60-1.30 mg/dL   Sodium 128 (L) 136-145 mmol/L   Potassium 4.2 3.5-5.1 mmol/L   Chloride 97 (L) 98-107 mmol/L   Co2 23 21-32  mmol/L   Calcium, Total 8.1 (L) 8.5-10.1 mg/dL   SGOT(AST) 27 15-37 Unit/L   SGPT (ALT) 22 U/L   Alkaline Phosphatase 80 50-136 Unit/L   Albumin 2.9 (L) 3.4-5.0 g/dL   Total Protein 6.7 6.4-8.2 g/dL   Bilirubin,Total 0.8 0.2-1.0 mg/dL   Osmolality 255 275-301   Anion Gap 8 7-16   EGFR (African American) >60 >34m/min   EGFR (Non-African Amer.) >60 >695mmin  CBC with Differential/Platelet  Result Value Ref Range   WBC 4.6 3.8-10.6 x10 3/mm 3   RBC 3.88 (L) 4.40-5.90 x10 6/mm 3   HGB 12.8 (L) 13.0-18.0 g/dL   HCT 37.8 (L) 40.0-52.0 %   MCV 98 80-100 fL   MCH 32.9 26.0-34.0 pg   MCHC 33.8 32.0-36.0 g/dL   RDW 14.9 (H) 11.5-14.5 %   Platelet 201 150-440 x10 3/mm 3   Neutrophil % 55.8 %   Lymphocyte % 24.5 %   Monocyte % 17.2 %   Eosinophil % 2.0 %   Basophil % 0.5 %   Neutrophil # 2.6 1.4-6.5 x10 3/mm 3   Lymphocyte # 1.1 1.0-3.6 x10 3/mm 3   Monocyte # 0.8 (H) 0.0-0.7 x10 3/mm 3   Eosinophil # 0.1 0.0-0.7 x10 3/mm 3   Basophil # 0.0 0.0-0.1 x10 3/mm 3  Basic metabolic panel  Result Value Ref Range   Glucose 82 65-99 mg/dL   BUN 1 (L) 7-18 mg/dL   Creatinine 0.57 (L) 0.60-1.30 mg/dL   Sodium 130 (L) 136-145 mmol/L   Potassium 4.0 3.5-5.1 mmol/L   Chloride 98 98-107 mmol/L   Co2 18 (L) 21-32 mmol/L   Calcium, Total 8.5 8.5-10.1 mg/dL   Osmolality 256 275-301   Anion Gap 14 7-16   EGFR (African American) >60 >6081min   EGFR (Non-African Amer.) >60 >58m48mn  Lipid panel  Result Value Ref Range   Cholesterol 68 0-200 mg/dL   Triglycerides 47 0-200 mg/dL   HDL Cholesterol 31 (L) 40-60 mg/dL   VLDL  Cholesterol, Calc 9 5-40 mg/dL   Ldl Cholesterol, Calc 28 0-100 mg/dL      Assessment & Plan:   Problem List Items Addressed This Visit      Cardiovascular and Mediastinum   Hypertension    Controlled. Continue current medications. CMP ordered. DASH diet reviewed. Smoking cessation encouarged. RTC 3 mos.       Relevant Medications   aspirin 81 MG tablet   Other Relevant Orders   Comprehensive Metabolic Panel (CMET)   PAD (peripheral artery disease) (HCC)    Check lipid panel today. Recommend starting baby aspirin. Encouraged smoking cessation.  After reading records, refer to [facility] as necessary.       Relevant Medications   aspirin 81 MG tablet   Other Relevant Orders   Lipid Profile     Respiratory   COPD (chronic obstructive pulmonary disease) (HCC)[place]    Spirometry done today. Pt has refills on inhalers.  Alarm symptoms reviewed. RTC 3 mos.       Relevant Orders   Spirometry with graph (Completed)   CBC with Differential     Nervous and Auditory   Peripheral neuropathy (HCC)    2/2 PAD?  Pt states he was getting injections in back. Will review previous records to determine how best to treat.  Consider gabapentin in future.         Genitourinary   BPH (benign prostatic hypertrophy)    Change to Flomax due to cost. Alarm symptoms reviewed. Consider  referral to urology if symptoms worsen.       Relevant Medications   tamsulosin (FLOMAX) 0.4 MG CAPS capsule     Other   Smoker    Encouraged smoking cessation today. Referred to [place] [facility].          Meds ordered this encounter  Medications  . DISCONTD: silodosin (RAPAFLO) 4 MG CAPS capsule    Sig: Take 1 capsule (4 mg total) by mouth daily with breakfast.    Dispense:  90 capsule    Refill:  3    Order Specific Question:  Supervising Provider    Answer:  Arlis Porta [phone]  . aspirin 81 MG tablet    Sig: Take 1 tablet (81 mg total) by mouth daily.    Dispense:  30 tablet     Refill:  11    Order Specific Question:  Supervising Provider    Answer:  Arlis Porta [phone]  . tamsulosin (FLOMAX) 0.4 MG CAPS capsule    Sig: Take 1 capsule (0.4 mg total) by mouth daily after supper.    Dispense:  30 capsule    Refill:  11    Order Specific Question:  Supervising Provider    Answer:  Arlis Porta [phone]      Follow up plan: Return in about 3 months (around 10/29/2015) for HTN, PAD, COPD.

## 2015-07-31 LAB — CBC WITH DIFFERENTIAL/PLATELET
BASOS ABS: 0 10*3/uL (ref 0.0–0.2)
Basos: 1 %
EOS (ABSOLUTE): 0.2 10*3/uL (ref 0.0–0.4)
EOS: 3 %
HEMOGLOBIN: 14.9 g/dL (ref 12.6–17.7)
Hematocrit: 42.5 % (ref 37.5–51.0)
IMMATURE GRANULOCYTES: 0 %
Immature Grans (Abs): 0 10*3/uL (ref 0.0–0.1)
LYMPHS ABS: 1.4 10*3/uL (ref 0.7–3.1)
Lymphs: 23 %
MCH: 33.6 pg — ABNORMAL HIGH (ref 26.6–33.0)
MCHC: 35.1 g/dL (ref 31.5–35.7)
MCV: 96 fL (ref 79–97)
MONOCYTES: 12 %
MONOS ABS: 0.7 10*3/uL (ref 0.1–0.9)
NEUTROS PCT: 61 %
Neutrophils Absolute: 3.6 10*3/uL (ref 1.4–7.0)
PLATELETS: 161 10*3/uL (ref 150–379)
RBC: 4.44 x10E6/uL (ref 4.14–5.80)
RDW: 13.8 % (ref 12.3–15.4)
WBC: 5.9 10*3/uL (ref 3.4–10.8)

## 2015-08-01 LAB — COMPREHENSIVE METABOLIC PANEL
ALBUMIN: 4.3 g/dL (ref 3.5–5.5)
ALK PHOS: 116 IU/L (ref 39–117)
ALT: 47 IU/L — ABNORMAL HIGH (ref 0–44)
AST: 41 IU/L — ABNORMAL HIGH (ref 0–40)
Albumin/Globulin Ratio: 1.3 (ref 1.1–2.5)
BUN / CREAT RATIO: 5 — AB (ref 9–20)
BUN: 4 mg/dL — ABNORMAL LOW (ref 6–24)
Bilirubin Total: 1.1 mg/dL (ref 0.0–1.2)
CO2: 23 mmol/L (ref 18–29)
Calcium: 9.8 mg/dL (ref 8.7–10.2)
Chloride: 90 mmol/L — ABNORMAL LOW (ref 97–106)
Creatinine, Ser: 0.76 mg/dL (ref 0.76–1.27)
GFR calc Af Amer: 121 mL/min/{1.73_m2} (ref >59)
GFR, EST NON AFRICAN AMERICAN: 105 mL/min/{1.73_m2} (ref >59)
GLOBULIN, TOTAL: 3.3 g/dL (ref 1.5–4.5)
Glucose: 120 mg/dL — ABNORMAL HIGH (ref 65–99)
Potassium: 4.5 mmol/L (ref 3.5–5.2)
SODIUM: 130 mmol/L — AB (ref 136–144)
Total Protein: 7.6 g/dL (ref 6.0–8.5)

## 2015-08-01 LAB — LIPID PANEL
CHOLESTEROL TOTAL: 135 mg/dL (ref 100–199)
Chol/HDL Ratio: 1.6 ratio units (ref 0.0–5.0)
HDL: 84 mg/dL (ref >39)
LDL Calculated: 35 mg/dL (ref 0–99)
Triglycerides: 78 mg/dL (ref 0–149)
VLDL CHOLESTEROL CAL: 16 mg/dL (ref 5–40)

## 2015-08-05 ENCOUNTER — Other Ambulatory Visit: Payer: Self-pay | Admitting: Family Medicine

## 2015-08-05 DIAGNOSIS — R739 Hyperglycemia, unspecified: Secondary | ICD-10-CM

## 2015-08-05 DIAGNOSIS — E871 Hypo-osmolality and hyponatremia: Secondary | ICD-10-CM

## 2015-08-20 ENCOUNTER — Other Ambulatory Visit: Payer: Self-pay | Admitting: *Deleted

## 2015-08-20 DIAGNOSIS — E871 Hypo-osmolality and hyponatremia: Secondary | ICD-10-CM

## 2015-08-20 DIAGNOSIS — R739 Hyperglycemia, unspecified: Secondary | ICD-10-CM

## 2015-08-21 LAB — COMPREHENSIVE METABOLIC PANEL
ALT: 16 IU/L (ref 0–44)
AST: 24 IU/L (ref 0–40)
Albumin/Globulin Ratio: 1.2 (ref 1.1–2.5)
Albumin: 4.1 g/dL (ref 3.5–5.5)
Alkaline Phosphatase: 92 IU/L (ref 39–117)
BUN/Creatinine Ratio: 6 — ABNORMAL LOW (ref 9–20)
BUN: 5 mg/dL — AB (ref 6–24)
Bilirubin Total: 0.6 mg/dL (ref 0.0–1.2)
CALCIUM: 9.7 mg/dL (ref 8.7–10.2)
CO2: 24 mmol/L (ref 18–29)
CREATININE: 0.77 mg/dL (ref 0.76–1.27)
Chloride: 93 mmol/L — ABNORMAL LOW (ref 96–106)
GFR, EST AFRICAN AMERICAN: 121 mL/min/{1.73_m2} (ref >59)
GFR, EST NON AFRICAN AMERICAN: 104 mL/min/{1.73_m2} (ref >59)
Globulin, Total: 3.4 g/dL (ref 1.5–4.5)
Glucose: 147 mg/dL — ABNORMAL HIGH (ref 65–99)
Potassium: 4.3 mmol/L (ref 3.5–5.2)
Sodium: 133 mmol/L — ABNORMAL LOW (ref 134–144)
TOTAL PROTEIN: 7.5 g/dL (ref 6.0–8.5)

## 2015-10-29 ENCOUNTER — Ambulatory Visit (INDEPENDENT_AMBULATORY_CARE_PROVIDER_SITE_OTHER): Payer: Medicaid Other | Admitting: Family Medicine

## 2015-10-29 ENCOUNTER — Encounter: Payer: Self-pay | Admitting: Family Medicine

## 2015-10-29 VITALS — BP 136/82 | HR 87 | Temp 98.2°F | Resp 16 | Ht 69.0 in | Wt 119.6 lb

## 2015-10-29 DIAGNOSIS — R7301 Impaired fasting glucose: Secondary | ICD-10-CM | POA: Diagnosis not present

## 2015-10-29 DIAGNOSIS — I10 Essential (primary) hypertension: Secondary | ICD-10-CM

## 2015-10-29 DIAGNOSIS — I739 Peripheral vascular disease, unspecified: Secondary | ICD-10-CM

## 2015-10-29 DIAGNOSIS — Z720 Tobacco use: Secondary | ICD-10-CM | POA: Diagnosis not present

## 2015-10-29 DIAGNOSIS — F17200 Nicotine dependence, unspecified, uncomplicated: Secondary | ICD-10-CM

## 2015-10-29 DIAGNOSIS — N400 Enlarged prostate without lower urinary tract symptoms: Secondary | ICD-10-CM | POA: Diagnosis not present

## 2015-10-29 MED ORDER — METOPROLOL SUCCINATE ER 50 MG PO TB24: 50.0000 mg | ORAL_TABLET | Freq: Every day | ORAL | Status: DC

## 2015-10-29 MED ORDER — AMLODIPINE BESYLATE 5 MG PO TABS: 5.0000 mg | ORAL_TABLET | Freq: Every day | ORAL | Status: DC

## 2015-10-29 NOTE — Patient Instructions (Signed)
Your goal blood pressure is 140/90 Work on low salt/sodium diet - goal <1.5gm (1,500mg) per day. Eat a diet high in fruits/vegetables and whole grains.  Look into mediterranean and DASH diet. Goal activity is 150min/wk of moderate intensity exercise.  This can be split into 30 minute chunks.  If you are not at this level, you can start with smaller 10-15 min increments and slowly build up activity. Look at www.heart.org for more resources  Please seek immediate medical attention at ER or Urgent Care if you develop: Chest pain, pressure or tightness. Shortness of breath accompanied by nausea or diaphoresis Visual changes Numbness or tingling on one side of the body Facial droop Altered mental status Or any concerning symptoms.   

## 2015-10-29 NOTE — Assessment & Plan Note (Signed)
Pt still smoking daily. Not ready to quit at this time.Encouraged smoking cessation.

## 2015-10-29 NOTE — Assessment & Plan Note (Signed)
Controlled in the office. Continue current regimen. Encouraged adherence to DASH diet. Encouraged smoking cessation.  Recheck 3 mos.  Recheck BMET.

## 2015-10-29 NOTE — Assessment & Plan Note (Signed)
Stable. Continue aspirin for secondary prevention.

## 2015-10-29 NOTE — Progress Notes (Signed)
Subjective:    Patient ID: Donald Handy., male    DOB: 03-08-1961, 55 y.o.   MRN: [id number]  HPI: Donald Noble. is a 55 y.o. male presenting on 10/29/2015 for Hypertension; COPD; and Benign Prostatic Hypertrophy   HPI  Pt presents for follow-up of hypertension, PAD, and COPD.   COPD: Breathing is doing. Occasional rescue inhaler use. Keeps with him incase he needs. No SOB. Occasional cough- dry. No dyspnea. Still smoking- 1/2 pack per day.  HTN: No CP, SOB, visual changes, or HA. Taking amlodipine once daily and metoprolol daily. Doing well. Does not check BP at home.  PAD: No intermittent claudication. No sores or ulcers on legs.  BPH; Changed to flomax due to cost last visit. Doing well. LUTS symptoms are improved.    Past Medical History  Diagnosis Date  . History of chicken pox   . Prostate disease   . Emphysema lung ([place])   . Seasonal allergies   . Essential hypertension   . Depression     Current Outpatient Prescriptions on File Prior to Visit  Medication Sig  . albuterol (PROVENTIL HFA;VENTOLIN HFA) 108 (90 BASE) MCG/ACT inhaler Inhale into the lungs every 6 (six) hours as needed for wheezing or shortness of breath.  Marland Kitchen aspirin 81 MG tablet Take 1 tablet (81 mg total) by mouth daily.  . tamsulosin (FLOMAX) 0.4 MG CAPS capsule Take 1 capsule (0.4 mg total) by mouth daily after supper.   No current facility-administered medications on file prior to visit.    Review of Systems  Constitutional: Negative for fever and chills.  HENT: Negative.   Respiratory: Negative for chest tightness, shortness of breath and wheezing.   Cardiovascular: Negative for chest pain, palpitations and leg swelling.  Gastrointestinal: Negative for nausea, vomiting and abdominal pain.  Endocrine: Negative.   Genitourinary: Negative for dysuria, urgency, discharge, penile pain and testicular pain.  Musculoskeletal: Negative for back pain, joint swelling and arthralgias.  Skin: Negative.    Neurological: Negative for dizziness, weakness, numbness and headaches.  Psychiatric/Behavioral: Negative for sleep disturbance and dysphoric mood.   Per HPI unless specifically indicated above     Objective:    BP 136/82 mmHg  Pulse 87  Temp(Src) 98.2 F (36.8 C) (Oral)  Resp 16  Ht 5\' 9"  (1.753 m)  Wt 119 lb 9.6 oz (54.25 kg)  BMI 17.65 kg/m2  SpO2 100%  Wt Readings from Last 3 Encounters:  10/29/15 119 lb 9.6 oz (54.25 kg)  07/29/15 115 lb 6.4 oz (52.345 kg)  06/20/15 112 lb 12.8 oz (51.166 kg)    Physical Exam  Constitutional: He is oriented to person, place, and time. He appears well-developed and well-nourished. No distress.  HENT:  Head: Normocephalic and atraumatic.  Neck: Neck supple. No thyromegaly present.  Cardiovascular: Normal rate, regular rhythm and normal heart sounds.  Exam reveals no gallop and no friction rub.   No murmur heard. Pulmonary/Chest: Effort normal and breath sounds normal. He has no wheezes.  Abdominal: Soft. Bowel sounds are normal. He exhibits no distension. There is no tenderness. There is no rebound.  Musculoskeletal: Normal range of motion. He exhibits no edema or tenderness.  Neurological: He is alert and oriented to person, place, and time. He has normal reflexes.  Skin: Skin is warm and dry. No rash noted. No erythema.  Psychiatric: He has a normal mood and affect. His behavior is normal. Thought content normal.   Results for orders placed or performed in  visit on 08/20/15  Comprehensive metabolic panel  Result Value Ref Range   Glucose 147 (H) 65 - 99 mg/dL   BUN 5 (L) 6 - 24 mg/dL   Creatinine, Ser 0.77 0.76 - 1.27 mg/dL   GFR calc non Af Amer 104 >59 mL/min/1.73   GFR calc Af Amer 121 >59 mL/min/1.73   BUN/Creatinine Ratio 6 (L) 9 - 20   Sodium 133 (L) 134 - 144 mmol/L   Potassium 4.3 3.5 - 5.2 mmol/L   Chloride 93 (L) 96 - 106 mmol/L   CO2 24 18 - 29 mmol/L   Calcium 9.7 8.7 - 10.2 mg/dL   Total Protein 7.5 6.0 - 8.5  g/dL   Albumin 4.1 3.5 - 5.5 g/dL   Globulin, Total 3.4 1.5 - 4.5 g/dL   Albumin/Globulin Ratio 1.2 1.1 - 2.5   Bilirubin Total 0.6 0.0 - 1.2 mg/dL   Alkaline Phosphatase 92 39 - 117 IU/L   AST 24 0 - 40 IU/L   ALT 16 0 - 44 IU/L      Assessment & Plan:   Problem List Items Addressed This Visit      Cardiovascular and Mediastinum   Hypertension - Primary    Controlled in the office. Continue current regimen. Encouraged adherence to DASH diet. Encouraged smoking cessation.  Recheck 3 mos.  Recheck BMET.       Relevant Medications   amLODipine (NORVASC) 5 MG tablet   metoprolol succinate (TOPROL-XL) 50 MG 24 hr tablet   Other Relevant Orders   Basic Metabolic Panel (BMET)   PAD (peripheral artery disease) (HCC)    Stable. Continue aspirin for secondary prevention.       Relevant Medications   amLODipine (NORVASC) 5 MG tablet   metoprolol succinate (TOPROL-XL) 50 MG 24 hr tablet     Genitourinary   BPH (benign prostatic hypertrophy)    Doing well with change to flomax. Continue current regimen.         Other   Smoker    Pt still smoking daily. Not ready to quit at this time.Encouraged smoking cessation.        Other Visit Diagnoses    Elevated fasting glucose        Relevant Orders    Hemoglobin A1c       Meds ordered this encounter  Medications  . amLODipine (NORVASC) 5 MG tablet    Sig: Take 1 tablet (5 mg total) by mouth daily.    Dispense:  30 tablet    Refill:  11    Order Specific Question:  Supervising Provider    Answer:  Arlis Porta [phone]  . metoprolol succinate (TOPROL-XL) 50 MG 24 hr tablet    Sig: Take 1 tablet (50 mg total) by mouth daily.    Dispense:  30 tablet    Refill:  11    Order Specific Question:  Supervising Provider    Answer:  Arlis Porta [phone]      Follow up plan: Return in about 3 months (around 01/26/2016).

## 2015-10-29 NOTE — Assessment & Plan Note (Signed)
Doing well with change to flomax. Continue current regimen.

## 2015-11-01 LAB — BASIC METABOLIC PANEL
BUN/Creatinine Ratio: 9 (ref 9–20)
BUN: 9 mg/dL (ref 6–24)
CALCIUM: 9.9 mg/dL (ref 8.7–10.2)
CO2: 23 mmol/L (ref 18–29)
CREATININE: 0.99 mg/dL (ref 0.76–1.27)
Chloride: 91 mmol/L — ABNORMAL LOW (ref 96–106)
GFR calc Af Amer: 101 mL/min/{1.73_m2} (ref >59)
GFR, EST NON AFRICAN AMERICAN: 87 mL/min/{1.73_m2} (ref >59)
Glucose: 103 mg/dL — ABNORMAL HIGH (ref 65–99)
POTASSIUM: 4.4 mmol/L (ref 3.5–5.2)
Sodium: 128 mmol/L — ABNORMAL LOW (ref 134–144)

## 2015-11-01 LAB — HEMOGLOBIN A1C
ESTIMATED AVERAGE GLUCOSE: 105 mg/dL
Hgb A1c MFr Bld: 5.3 % (ref 4.8–5.6)

## 2015-11-08 ENCOUNTER — Other Ambulatory Visit: Payer: Self-pay | Admitting: Family Medicine

## 2015-11-08 DIAGNOSIS — E871 Hypo-osmolality and hyponatremia: Secondary | ICD-10-CM

## 2015-11-12 ENCOUNTER — Ambulatory Visit: Payer: Medicaid Other | Admitting: Family Medicine

## 2015-11-14 ENCOUNTER — Ambulatory Visit
Admission: RE | Admit: 2015-11-14 | Discharge: 2015-11-14 | Disposition: A | Discharge status: Home / Self Care | Payer: Medicaid Other | Source: Ambulatory Visit | Attending: Family Medicine | Admitting: Family Medicine

## 2015-11-14 ENCOUNTER — Ambulatory Visit (INDEPENDENT_AMBULATORY_CARE_PROVIDER_SITE_OTHER): Payer: Medicaid Other | Admitting: Family Medicine

## 2015-11-14 VITALS — BP 124/68 | HR 83 | Temp 97.7°F | Resp 16 | Ht 69.0 in | Wt 118.5 lb

## 2015-11-14 DIAGNOSIS — G6289 Other specified polyneuropathies: Secondary | ICD-10-CM | POA: Diagnosis not present

## 2015-11-14 DIAGNOSIS — M25551 Pain in right hip: Secondary | ICD-10-CM

## 2015-11-14 DIAGNOSIS — E871 Hypo-osmolality and hyponatremia: Secondary | ICD-10-CM | POA: Diagnosis not present

## 2015-11-14 DIAGNOSIS — R296 Repeated falls: Secondary | ICD-10-CM | POA: Diagnosis not present

## 2015-11-14 MED ORDER — TRAMADOL HCL 50 MG PO TABS: 50.0000 mg | ORAL_TABLET | Freq: Two times a day (BID) | ORAL | Status: DC | PRN

## 2015-11-14 MED ORDER — NAPROXEN 375 MG PO TABS: 375.0000 mg | ORAL_TABLET | Freq: Two times a day (BID) | ORAL | Status: DC

## 2015-11-14 NOTE — Patient Instructions (Signed)
Hip pain: There is no fracture. We will try some pain medication and physical therapy. Please have someone stay with you when taking narcotic pain medication. Only take pain medication at bedtime.   Falls: I want you to go to Physical Therapy to help with your falls. We will also have you see a neurologist.  Low sodium: I want to recheck your labs. Drink gatorade to help increase your salt. Drink no more than 2L fluids per day. We may have you see an endocrinologist to determine why your sodium is so low.

## 2015-11-14 NOTE — Progress Notes (Signed)
Subjective:    Patient ID: Donald [name]., male    DOB: Oct 08, 1960, 55 y.o.   MRN: [id number]  HPI: Donald [name]. is a 55 y.o. male presenting on 11/14/2015 for Hip Pain   HPI  Pt presents for hip pain- R hip after a fall. He fell on Saturday in his kitchen. [place] and landed on his bottom/side.  Able to walk with walker and bear wear on hip. No pain with resting. Painful when moving. Pain is from side of the hip radiating to the thigh.  Has been taking aleve at home- to help with pain. He took lots aleve yesterday to help with pain. He is ambulating with a walker today to help get off the R hip.  History of falls- over the past few years. 1-2 falls per week- previously seeing a neurologist. Thought to be due to nerve damage in his legs. Pt reports his legs give out and he goes down. Has not seen a physical therapist. 4-5 falls since January 1 of 2017. Has not hit head. Pt reports no use of sedating medications. He reports 6 beers per week alcohol intake. Does not drink every day.   Hyponatremia: f/u labwork. Reports no weakness other than legs. No lethargy. This has been a chronic issue. Unsure if it was worked-up in the past.   Past Medical History  Diagnosis Date  . History of chicken pox   . Prostate disease   . Emphysema lung ([place])   . Seasonal allergies   . Essential hypertension   . Depression     Current Outpatient Prescriptions on File Prior to Visit  Medication Sig  . albuterol (PROVENTIL HFA;VENTOLIN HFA) 108 (90 BASE) MCG/ACT inhaler Inhale into the lungs every 6 (six) hours as needed for wheezing or shortness of breath.  Marland Kitchen amLODipine (NORVASC) 5 MG tablet Take 1 tablet (5 mg total) by mouth daily.  Marland Kitchen aspirin 81 MG tablet Take 1 tablet (81 mg total) by mouth daily.  . metoprolol succinate (TOPROL-XL) 50 MG 24 hr tablet Take 1 tablet (50 mg total) by mouth daily.  . tamsulosin (FLOMAX) 0.4 MG CAPS capsule Take 1 capsule (0.4 mg total) by mouth daily after supper.   No  current facility-administered medications on file prior to visit.    Review of Systems  Constitutional: Negative for fever and chills.  HENT: Negative.   Respiratory: Negative for chest tightness, shortness of breath and wheezing.   Cardiovascular: Negative for chest pain, palpitations and leg swelling.  Gastrointestinal: Negative for nausea, vomiting and abdominal pain.  Endocrine: Negative.   Genitourinary: Negative for dysuria, urgency, discharge, penile pain and testicular pain.  Musculoskeletal: Positive for arthralgias and gait problem. Negative for back pain, joint swelling, neck pain and neck stiffness.  Skin: Negative.   Neurological: Positive for dizziness. Negative for weakness, numbness and headaches.  Psychiatric/Behavioral: Negative for sleep disturbance and dysphoric mood.   Per HPI unless specifically indicated above     Objective:    BP 124/68 mmHg  Pulse 83  Temp(Src) 97.7 F (36.5 C) (Oral)  Resp 16  Ht 5\' 9"  (1.753 m)  Wt 118 lb 8 oz (53.751 kg)  BMI 17.49 kg/m2  Wt Readings from Last 3 Encounters:  11/14/15 118 lb 8 oz (53.751 kg)  10/29/15 119 lb 9.6 oz (54.25 kg)  07/29/15 115 lb 6.4 oz (52.345 kg)    Physical Exam  Constitutional: He is oriented to person, place, and time. He appears well-developed and  well-nourished. No distress.  HENT:  Head: Normocephalic and atraumatic.  Neck: Neck supple. No thyromegaly present.  Cardiovascular: Normal rate, regular rhythm and normal heart sounds.  Exam reveals no gallop and no friction rub.   No murmur heard. Pulmonary/Chest: Effort normal and breath sounds normal. He has no wheezes.  Abdominal: Soft. Bowel sounds are normal. He exhibits no distension. There is no tenderness. There is no rebound.  Musculoskeletal: He exhibits no edema.       Right hip: He exhibits decreased range of motion and tenderness. He exhibits no bony tenderness, no swelling, no crepitus, no deformity and no laceration.  Limited  Internal/external rotation due to pain. Guarding with PROM due to pain.   Neurological: He is alert and oriented to person, place, and time. He has normal reflexes. No cranial nerve deficit or sensory deficit.  Reflex Scores:      Patellar reflexes are 2+ on the right side and 2+ on the left side. Skin: Skin is warm and dry. No rash noted. No erythema.  Psychiatric: He has a normal mood and affect. His behavior is normal. Thought content normal.   Results for orders placed or performed in visit on [id number]  Basic Metabolic Panel (BMET)  Result Value Ref Range   Glucose 103 (H) 65 - 99 mg/dL   BUN 9 6 - 24 mg/dL   Creatinine, Ser 0.99 0.76 - 1.27 mg/dL   GFR calc non Af Amer 87 >59 mL/min/1.73   GFR calc Af Amer 101 >59 mL/min/1.73   BUN/Creatinine Ratio 9 9 - 20   Sodium 128 (L) 134 - 144 mmol/L   Potassium 4.4 3.5 - 5.2 mmol/L   Chloride 91 (L) 96 - 106 mmol/L   CO2 23 18 - 29 mmol/L   Calcium 9.9 8.7 - 10.2 mg/dL  Hemoglobin A1c  Result Value Ref Range   Hgb A1c MFr Bld 5.3 4.8 - 5.6 %   Est. average glucose Bld gHb Est-mCnc 105 mg/dL      Assessment & Plan:   Problem List Items Addressed This Visit      Nervous and Auditory   Peripheral neuropathy (HCC)    Check B12 and folate to r/o causes. Thought be from nerve damage. Given number of falls and weakness in legs, would like him to see neurology for further work-up pending lab results.       Relevant Orders   Vitamin B12   Folate   Ambulatory referral to Neurology     Other   Hyponatremia    Trend in labs. Recheck today, labs to r/o SIADH. Pt reports 6 beers per week only. Consider endocrine or nephrology referral for continued issues. Encouraged pt to drink gatorade and less than 2L water per day.  Reviewed alarm symptoms.       Relevant Orders   Comprehensive metabolic panel   Osmolality   Osmolality, urine   Sodium, urine, random    Other Visit Diagnoses    Multiple falls    -  Primary    Reviewed falls  precautions. Refer to PT for gait and balance training. Refer to neuro for further work-up and evaluation.     Relevant Orders    Ambulatory referral to Neurology    Ambulatory referral to Physical Therapy    Lateral pain of right hip        2/2 fall. No fracture. Some degenerative changes in both hips. naproxen BID x 7 days. Avoid other nsaids. PRN tramadol to be taken  BID- strongly encouraged only taking when necessary. Pt has family staying with him- family will help him at home. Reviewed falls precautions.     Relevant Medications    traMADol (ULTRAM) 50 MG tablet    naproxen (NAPROSYN) 375 MG tablet    Other Relevant Orders    DG HIP UNILAT WITH PELVIS 2-3 VIEWS RIGHT (Completed)    Ambulatory referral to Physical Therapy       Meds ordered this encounter  Medications  . traMADol (ULTRAM) 50 MG tablet    Sig: Take 1 tablet (50 mg total) by mouth every 12 (twelve) hours as needed.    Dispense:  10 tablet    Refill:  0    Order Specific Question:  Supervising Provider    Answer:  Arlis Porta [phone]  . naproxen (NAPROSYN) 375 MG tablet    Sig: Take 1 tablet (375 mg total) by mouth 2 (two) times daily with a meal.    Dispense:  14 tablet    Refill:  0    Order Specific Question:  Supervising Provider    Answer:  Arlis Porta [phone]      Follow up plan: No Follow-up on file.

## 2015-11-14 NOTE — Assessment & Plan Note (Signed)
Check B12 and folate to r/o causes. Thought be from nerve damage. Given number of falls and weakness in legs, would like him to see neurology for further work-up pending lab results.

## 2015-11-14 NOTE — Assessment & Plan Note (Signed)
Trend in labs. Recheck today, labs to r/o SIADH. Pt reports 6 beers per week only. Consider endocrine or nephrology referral for continued issues. Encouraged pt to drink gatorade and less than 2L water per day.  Reviewed alarm symptoms.

## 2015-11-15 LAB — COMPREHENSIVE METABOLIC PANEL
ALBUMIN: 4.3 g/dL (ref 3.5–5.5)
ALK PHOS: 107 IU/L (ref 39–117)
ALT: 16 IU/L (ref 0–44)
AST: 21 IU/L (ref 0–40)
Albumin/Globulin Ratio: 1.2 (ref 1.1–2.5)
BILIRUBIN TOTAL: 0.9 mg/dL (ref 0.0–1.2)
BUN / CREAT RATIO: 8 — AB (ref 9–20)
BUN: 8 mg/dL (ref 6–24)
CHLORIDE: 92 mmol/L — AB (ref 96–106)
CO2: 21 mmol/L (ref 18–29)
CREATININE: 0.96 mg/dL (ref 0.76–1.27)
Calcium: 9.8 mg/dL (ref 8.7–10.2)
GFR calc Af Amer: 105 mL/min/{1.73_m2} (ref >59)
GFR calc non Af Amer: 91 mL/min/{1.73_m2} (ref >59)
GLUCOSE: 128 mg/dL — AB (ref 65–99)
Globulin, Total: 3.6 g/dL (ref 1.5–4.5)
Potassium: 3.7 mmol/L (ref 3.5–5.2)
Sodium: 133 mmol/L — ABNORMAL LOW (ref 134–144)
Total Protein: 7.9 g/dL (ref 6.0–8.5)

## 2015-11-15 LAB — FOLATE: Folate: 4 ng/mL (ref >3.0)

## 2015-11-15 LAB — SODIUM, URINE, RANDOM: Sodium, Ur: 29 mmol/L

## 2015-11-15 LAB — VITAMIN B12: VITAMIN B 12: 247 pg/mL (ref 211–946)

## 2015-11-15 LAB — OSMOLALITY: OSMOLALITY MEAS: 281 mosm/kg (ref 275–295)

## 2015-11-15 LAB — OSMOLALITY, URINE: OSMOLALITY UR: 379 mosm/kg

## 2015-11-21 ENCOUNTER — Ambulatory Visit: Payer: Medicaid Other | Admitting: Family Medicine

## 2015-11-27 ENCOUNTER — Ambulatory Visit: Payer: Medicaid Other | Attending: Family Medicine | Admitting: Physical Therapy

## 2015-11-27 ENCOUNTER — Encounter: Payer: Self-pay | Admitting: Physical Therapy

## 2015-11-27 DIAGNOSIS — M25551 Pain in right hip: Secondary | ICD-10-CM | POA: Diagnosis present

## 2015-11-27 DIAGNOSIS — R269 Unspecified abnormalities of gait and mobility: Secondary | ICD-10-CM | POA: Diagnosis present

## 2015-11-27 DIAGNOSIS — M6281 Muscle weakness (generalized): Secondary | ICD-10-CM | POA: Diagnosis present

## 2015-11-28 ENCOUNTER — Ambulatory Visit (INDEPENDENT_AMBULATORY_CARE_PROVIDER_SITE_OTHER): Payer: Medicaid Other | Admitting: Family Medicine

## 2015-11-28 VITALS — BP 98/74 | HR 101 | Temp 98.3°F | Resp 16 | Ht 69.0 in | Wt 115.0 lb

## 2015-11-28 DIAGNOSIS — I10 Essential (primary) hypertension: Secondary | ICD-10-CM

## 2015-11-28 DIAGNOSIS — M25551 Pain in right hip: Secondary | ICD-10-CM

## 2015-11-28 MED ORDER — BLOOD PRESSURE CUFF MISC: 1.0000 | Freq: Every day | Status: DC

## 2015-11-28 MED ORDER — NAPROXEN 375 MG PO TABS: 375.0000 mg | ORAL_TABLET | Freq: Two times a day (BID) | ORAL | Status: DC

## 2015-11-28 MED ORDER — CAPSAICIN 0.025 % EX CREA: TOPICAL_CREAM | Freq: Two times a day (BID) | CUTANEOUS | Status: DC

## 2015-11-28 NOTE — Assessment & Plan Note (Addendum)
Hold medications today due to hypotension. Resume metoprolol tomorrow. STOP amlodipine x1 week until follow-up. Drink plenty of fluids. BP cuff at home to check BP during next week. Call with low BP.  Seek medical care for dizziness, feeling faint. Recheck 1 week.

## 2015-11-28 NOTE — Progress Notes (Signed)
Subjective:    Patient ID: Donald [name]., male    DOB: Nov 10, 1960, 55 y.o.   MRN: [id number]  HPI: Dirk Tessmann. is a 55 y.o. male presenting on 11/28/2015 for Hip Pain   HPI   [place] 3 weeks ago. X-ray showed no fracture. Pain in right hip is about the same. Saw PT for the first time yesterday. Aleve worked better than tramadol. Has not been taking anything for pain since he ran out of naproxen that was prescribed. Has started using ice packs instead of heat per PT rec's, which has been helping. Has trouble sleeping at night because pain keeps him awake. Right hip and thigh pain with movement, graded as 8/10. Describes as sharp and achey. Pain is worse with walking and movement. Pt was using a walker until yesterday and is now using a cane per PT rec's. Has nerve damage, so he has numbness and tingling in both legs all of the time that has not changed. No new falls.  BP low today. Denies dizziness. Has not taken medications today. No lightheadedness or dizziness. Drinking plenty of fluids. Drinking gatorade. To help with sodium levels.   Hyponatremia: No dizziness or weakness. No altermental status. Has been drinking gatorade and eating chicken broth to help with sodium levels.    Past Medical History  Diagnosis Date  . History of chicken pox   . Prostate disease   . Emphysema lung ([place])   . Seasonal allergies   . Essential hypertension   . Depression     Current Outpatient Prescriptions on File Prior to Visit  Medication Sig  . albuterol (PROVENTIL HFA;VENTOLIN HFA) 108 (90 BASE) MCG/ACT inhaler Inhale into the lungs every 6 (six) hours as needed for wheezing or shortness of breath.  Marland Kitchen amLODipine (NORVASC) 5 MG tablet Take 1 tablet (5 mg total) by mouth daily.  Marland Kitchen aspirin 81 MG tablet Take 1 tablet (81 mg total) by mouth daily.  . metoprolol succinate (TOPROL-XL) 50 MG 24 hr tablet Take 1 tablet (50 mg total) by mouth daily.  . tamsulosin (FLOMAX) 0.4 MG CAPS capsule Take 1  capsule (0.4 mg total) by mouth daily after supper.   No current facility-administered medications on file prior to visit.    Review of Systems  Constitutional: Positive for activity change (since fall 3 weeks ago).  HENT: Negative for congestion and hearing loss.   Eyes: Negative for visual disturbance.  Respiratory: Positive for wheezing. Negative for chest tightness and shortness of breath.   Cardiovascular: Negative for chest pain.  Musculoskeletal: Positive for myalgias, arthralgias and gait problem. Negative for back pain and joint swelling.  Skin: Negative for color change and wound.  Neurological: Positive for numbness (numbness and tingling to bilateral lower extremities chronic, no changes). Negative for dizziness, weakness and light-headedness.  Psychiatric/Behavioral: Positive for sleep disturbance. Negative for behavioral problems and agitation.   Per HPI unless specifically indicated above     Objective:    BP 98/74 mmHg  Pulse 101  Temp(Src) 98.3 F (36.8 C) (Oral)  Resp 16  Ht 5\' 9"  (1.753 m)  Wt 115 lb (52.164 kg)  BMI 16.97 kg/m2  Wt Readings from Last 3 Encounters:  11/28/15 115 lb (52.164 kg)  11/14/15 118 lb 8 oz (53.751 kg)  10/29/15 119 lb 9.6 oz (54.25 kg)    Physical Exam  Constitutional: He is oriented to person, place, and time. He appears well-developed.  HENT:  Head: Normocephalic.  Eyes: Conjunctivae and  lids are normal.  Neck: Carotid bruit is not present.  Cardiovascular: Normal rate, regular rhythm, S1 normal, S2 normal and normal heart sounds.  Exam reveals no distant heart sounds.   No murmur heard. Pulses:      Posterior tibial pulses are 1+ on the right side, and 1+ on the left side.  Pulmonary/Chest: Effort normal. No accessory muscle usage. No tachypnea. No respiratory distress. He has wheezes.  Musculoskeletal: He exhibits tenderness.       Right hip: He exhibits decreased range of motion (due to pain/guarding) and tenderness. He  exhibits no swelling, no crepitus and no laceration.  Neurological: He is alert and oriented to person, place, and time.  Skin: Skin is warm and dry. No abrasion, no bruising, no ecchymosis and no rash noted. No erythema.  Psychiatric: He has a normal mood and affect. His behavior is normal.   Results for orders placed or performed in visit on 11/14/15  Comprehensive metabolic panel  Result Value Ref Range   Glucose 128 (H) 65 - 99 mg/dL   BUN 8 6 - 24 mg/dL   Creatinine, Ser 0.96 0.76 - 1.27 mg/dL   GFR calc non Af Amer 91 >59 mL/min/1.73   GFR calc Af Amer 105 >59 mL/min/1.73   BUN/Creatinine Ratio 8 (L) 9 - 20   Sodium 133 (L) 134 - 144 mmol/L   Potassium 3.7 3.5 - 5.2 mmol/L   Chloride 92 (L) 96 - 106 mmol/L   CO2 21 18 - 29 mmol/L   Calcium 9.8 8.7 - 10.2 mg/dL   Total Protein 7.9 6.0 - 8.5 g/dL   Albumin 4.3 3.5 - 5.5 g/dL   Globulin, Total 3.6 1.5 - 4.5 g/dL   Albumin/Globulin Ratio 1.2 1.1 - 2.5   Bilirubin Total 0.9 0.0 - 1.2 mg/dL   Alkaline Phosphatase 107 39 - 117 IU/L   AST 21 0 - 40 IU/L   ALT 16 0 - 44 IU/L  Osmolality  Result Value Ref Range   Osmolality Meas 281 275 - 295 mOsmol/kg  Osmolality, urine  Result Value Ref Range   Osmolality, Ur 379 mOsmol/kg  Sodium, urine, random  Result Value Ref Range   Sodium, Ur 29 Not Estab. mmol/L  Vitamin B12  Result Value Ref Range   Vitamin B-12 247 211 - 946 pg/mL  Folate  Result Value Ref Range   Folate 4.0 >3.0 ng/mL      Assessment & Plan:   Problem List Items Addressed This Visit      Cardiovascular and Mediastinum   Hypertension - Primary    Hold medications today due to hypotension. Resume metoprolol tomorrow. STOP amlodipine x1 week until follow-up. Drink plenty of fluids. BP cuff at home to check BP during next week. Call with low BP.  Seek medical care for dizziness, feeling faint. Recheck 1 week.       Relevant Medications   Blood Pressure Monitoring (BLOOD PRESSURE CUFF) MISC    Other Visit  Diagnoses    Lateral pain of right hip        2/2 fall. Renew naproxen BID to help with pain. Continue exercises as suggested by PT. Use capsacin cream at night for pain. Pillows to help for comfort. Ortho if not improving.     Relevant Medications    naproxen (NAPROSYN) 375 MG tablet    capsaicin (ZOSTRIX) 0.025 % cream    Blood Pressure Monitoring (BLOOD PRESSURE CUFF) MISC       Meds ordered this  encounter  Medications  . naproxen (NAPROSYN) 375 MG tablet    Sig: Take 1 tablet (375 mg total) by mouth 2 (two) times daily with a meal.    Dispense:  14 tablet    Refill:  0    Order Specific Question:  Supervising Provider    Answer:  Arlis Porta [phone]  . capsaicin (ZOSTRIX) 0.025 % cream    Sig: Apply topically 2 (two) times daily.    Dispense:  60 g    Refill:  1    Order Specific Question:  Supervising Provider    Answer:  Arlis Porta [id number]  . Blood Pressure Monitoring (BLOOD PRESSURE CUFF) MISC    Sig: 1 each by Does not apply route daily.    Dispense:  1 each    Refill:  0    Order Specific Question:  Supervising Provider    Answer:  Arlis Porta [id number]      Follow up plan: Return in about 1 week (around 12/05/2015) for BP check. Marland Kitchen

## 2015-11-28 NOTE — Patient Instructions (Signed)
Blood pressure: You blood pressure is low today. Hold your blood pressure medications today. Please get a blood pressure cuff from the pharmacy to check BP at home. Take only your metoprolol tomorrow and hold amlodipine until I see you next week.  Hip pain: Take naproxen for pain twice daily. Use capsaicin cream at night-time to help with pain. Sleep with pillow between your legs and behind hip for comfort.   Sodium levels: Drink plenty of gatorade. We will recheck it in a few weeks. If it is still low, we will have you see a specialist.

## 2015-11-28 NOTE — Therapy (Addendum)
[facility] [address] [place], [place], [address] Phone: [phone]   Fax:  [phone]  Physical Therapy Evaluation  Patient Details  Name: Cyprian Naasz. MRN: [id number] Date of Birth: November 27, 1960 Referring Provider: Amy Krebs  Encounter Date: 11/27/2015    Past Medical History  Diagnosis Date  . History of chicken pox   . Prostate disease   . Emphysema lung ([place])   . Seasonal allergies   . Essential hypertension   . Depression     Past Surgical History  Procedure Laterality Date  . None      There were no vitals filed for this visit.  Visit Diagnosis:  Right hip pain  Gait difficulty  Muscle weakness (generalized)     Pt. reports fall on Saturday March 4th with increase c/o R hip pain.  Pt. c/o >6/10 R hip pain with palpation.   Pt. entered PT clinic with use of rollator for safety due to fall with no h/o rolllator use prior to fall.          PT Education - 12/09/15 1104    Education provided Yes   Education Details Instructed in proper use of rollator and B LE strengthening/ walking program   Person(s) Educated Patient   Methods Explanation;Demonstration   Comprehension Verbalized understanding;Returned demonstration        Pt. is a 55 y/o male with recent c/o R hip pain after fall.  Pt. reports >6/10 R hip pain with palpation/ prolonged standing  and walking tasks.  Pt. ambulates around PT clinic with R antalgic gait pattern with and without use of rollator.  Berg  balance test: 47/56 (fall risk).  Pt. benefits from rollator but will be able to progress to [facility] for safety/ prevent fall risk.   Decrease B LE muscle strength: R hip flexion/ abd./ quad/ hamstring grossly 4-/5 MMT.  L LE muscle strength grossly  5/5 MMT except hip flexion 4+/5 MMT.  R hip pain with palpation over R hip greater trochanter/ glut. med.  Pt. will benefit  from continued use of assistive device and LE strength training to  improve pain-free mobility/ decrease fall risk.           Problem List Patient Active Problem List   Diagnosis Date Noted  . Hyponatremia 11/14/2015  . Peripheral neuropathy ([place]) 07/29/2015  . Hypertension 06/20/2015  . BPH (benign prostatic hypertrophy) 06/20/2015  . PAD (peripheral artery disease) ([place]) 06/20/2015  . Smoker 06/20/2015  . COPD (chronic obstructive pulmonary disease) ([place]) 06/20/2015   Pura Spice, PT, DPT # [phone]   11/28/2015, 12:37PM  [facility] [address] [place], [place], [address] Phone: [phone]   Fax:  [phone]  Name: [name]. MRN: [id number] Date of Birth: 22-May-1961

## 2015-12-04 ENCOUNTER — Encounter: Payer: Self-pay | Admitting: Family Medicine

## 2015-12-04 ENCOUNTER — Ambulatory Visit (INDEPENDENT_AMBULATORY_CARE_PROVIDER_SITE_OTHER): Payer: Medicaid Other | Admitting: Family Medicine

## 2015-12-04 VITALS — BP 118/76 | HR 74 | Temp 98.0°F | Resp 16 | Ht 69.0 in | Wt 118.0 lb

## 2015-12-04 DIAGNOSIS — I10 Essential (primary) hypertension: Secondary | ICD-10-CM | POA: Diagnosis not present

## 2015-12-04 DIAGNOSIS — M25552 Pain in left hip: Secondary | ICD-10-CM

## 2015-12-04 DIAGNOSIS — F17200 Nicotine dependence, unspecified, uncomplicated: Secondary | ICD-10-CM

## 2015-12-04 DIAGNOSIS — Z720 Tobacco use: Secondary | ICD-10-CM | POA: Diagnosis not present

## 2015-12-04 NOTE — Patient Instructions (Signed)
Keep holding amlodipine daily until I see you back next month. I think your blood pressure is well controlled with just metoprolol right now.  Please continue to stop check your blood pressure at home. If BP >150/90 consistently please let me know.  Your goal blood pressure is 140/90. Work on low salt/sodium diet - goal <1.5gm (1,500mg ) per day. Eat a diet high in fruits/vegetables and whole grains.  Look into mediterranean and DASH diet. Goal activity is 115min/wk of moderate intensity exercise.  This can be split into 30 minute chunks.  If you are not at this level, you can start with smaller 10-15 min increments and slowly build up activity. Look at [email or web address] for more resources

## 2015-12-04 NOTE — Assessment & Plan Note (Signed)
Hypotension resolved. Continue to hold amlodipine and recheck BP in 1 mos. Pt will spot check at home- call if consistently >150/90. Consider adding amlodipine back at 2.5mg  daily.

## 2015-12-04 NOTE — Progress Notes (Signed)
Subjective:    Patient ID: Donald Handy., male    DOB: July 29, 1961, 55 y.o.   MRN: [id number]  HPI: Donald Alyea. is a 55 y.o. male presenting on 12/04/2015 for Hypotension   HPI  Pt presents for follow-up of hypotension. Was holding his amlodipine due to low blood pressure. Checked at home 110/70 average.  Just taking metoprolol daily. No dizziness. No chest pain. Overall doing well.  L hip pain is doing well. No longer ambulating with a cane or walker. Taking naproxen PRN. Using capsaicin cream PRN for pain. No falls.   Past Medical History  Diagnosis Date  . History of chicken pox   . Prostate disease   . Emphysema lung ([place])   . Seasonal allergies   . Essential hypertension   . Depression     Current Outpatient Prescriptions on File Prior to Visit  Medication Sig  . albuterol (PROVENTIL HFA;VENTOLIN HFA) 108 (90 BASE) MCG/ACT inhaler Inhale into the lungs every 6 (six) hours as needed for wheezing or shortness of breath.  Marland Kitchen amLODipine (NORVASC) 5 MG tablet Take 1 tablet (5 mg total) by mouth daily.  Marland Kitchen aspirin 81 MG tablet Take 1 tablet (81 mg total) by mouth daily.  . Blood Pressure Monitoring (BLOOD PRESSURE CUFF) MISC 1 each by Does not apply route daily.  . capsaicin (ZOSTRIX) 0.025 % cream Apply topically 2 (two) times daily.  . metoprolol succinate (TOPROL-XL) 50 MG 24 hr tablet Take 1 tablet (50 mg total) by mouth daily.  . naproxen (NAPROSYN) 375 MG tablet Take 1 tablet (375 mg total) by mouth 2 (two) times daily with a meal.  . tamsulosin (FLOMAX) 0.4 MG CAPS capsule Take 1 capsule (0.4 mg total) by mouth daily after supper.   No current facility-administered medications on file prior to visit.    Review of Systems  Constitutional: Negative for fever and chills.  HENT: Negative.   Respiratory: Negative for apnea, chest tightness, shortness of breath and wheezing.   Cardiovascular: Negative for chest pain, palpitations and leg swelling.  Gastrointestinal:  Negative for nausea, vomiting and abdominal pain.  Endocrine: Negative.   Genitourinary: Negative for dysuria, urgency, discharge, penile pain and testicular pain.  Musculoskeletal: Positive for arthralgias (L hip pain, improving. ). Negative for back pain and joint swelling.  Skin: Negative.   Neurological: Negative for dizziness, syncope, weakness, light-headedness, numbness and headaches.  Psychiatric/Behavioral: Negative for sleep disturbance and dysphoric mood.   Per HPI unless specifically indicated above     Objective:    BP 118/76 mmHg  Pulse 74  Temp(Src) 98 F (36.7 C) (Oral)  Resp 16  Ht 5\' 9"  (1.753 m)  Wt 118 lb (53.524 kg)  BMI 17.42 kg/m2  Wt Readings from Last 3 Encounters:  12/04/15 118 lb (53.524 kg)  11/28/15 115 lb (52.164 kg)  11/14/15 118 lb 8 oz (53.751 kg)    Physical Exam  Constitutional: He is oriented to person, place, and time. He appears well-developed and well-nourished. No distress.  HENT:  Head: Normocephalic and atraumatic.  Neck: Neck supple. No thyromegaly present.  Cardiovascular: Normal rate, regular rhythm and normal heart sounds.  Exam reveals no gallop and no friction rub.   No murmur heard. Pulmonary/Chest: Effort normal and breath sounds normal. He has no wheezes.  Abdominal: Soft. Bowel sounds are normal. He exhibits no distension. There is no tenderness. There is no rebound.  Musculoskeletal: He exhibits no edema or tenderness.  Left hip: He exhibits decreased range of motion (decrease internal/external rotation due to pain). He exhibits no tenderness and no bony tenderness.  Neurological: He is alert and oriented to person, place, and time. He has normal reflexes.  Skin: Skin is warm and dry. No rash noted. No erythema.  Psychiatric: He has a normal mood and affect. His behavior is normal. Thought content normal.   Results for orders placed or performed in visit on 11/14/15  Comprehensive metabolic panel  Result Value Ref  Range   Glucose 128 (H) 65 - 99 mg/dL   BUN 8 6 - 24 mg/dL   Creatinine, Ser 0.96 0.76 - 1.27 mg/dL   GFR calc non Af Amer 91 >59 mL/min/1.73   GFR calc Af Amer 105 >59 mL/min/1.73   BUN/Creatinine Ratio 8 (L) 9 - 20   Sodium 133 (L) 134 - 144 mmol/L   Potassium 3.7 3.5 - 5.2 mmol/L   Chloride 92 (L) 96 - 106 mmol/L   CO2 21 18 - 29 mmol/L   Calcium 9.8 8.7 - 10.2 mg/dL   Total Protein 7.9 6.0 - 8.5 g/dL   Albumin 4.3 3.5 - 5.5 g/dL   Globulin, Total 3.6 1.5 - 4.5 g/dL   Albumin/Globulin Ratio 1.2 1.1 - 2.5   Bilirubin Total 0.9 0.0 - 1.2 mg/dL   Alkaline Phosphatase 107 39 - 117 IU/L   AST 21 0 - 40 IU/L   ALT 16 0 - 44 IU/L  Osmolality  Result Value Ref Range   Osmolality Meas 281 275 - 295 mOsmol/kg  Osmolality, urine  Result Value Ref Range   Osmolality, Ur 379 mOsmol/kg  Sodium, urine, random  Result Value Ref Range   Sodium, Ur 29 Not Estab. mmol/L  Vitamin B12  Result Value Ref Range   Vitamin B-12 247 211 - 946 pg/mL  Folate  Result Value Ref Range   Folate 4.0 >3.0 ng/mL      Assessment & Plan:   Problem List Items Addressed This Visit      Cardiovascular and Mediastinum   Hypertension - Primary    Hypotension resolved. Continue to hold amlodipine and recheck BP in 1 mos. Pt will spot check at home- call if consistently >150/90. Consider adding amlodipine back at 2.5mg  daily.          Other   Smoker    Counseled to quit smoking.        Other Visit Diagnoses    Lateral pain of left hip        Improving with NSAIDs and physical therapy. Pt able to ambulate without assistance. Return if not improving.        No orders of the defined types were placed in this encounter.      Follow up plan: Return in about 1 month (around 01/04/2016) for BP.

## 2015-12-04 NOTE — Assessment & Plan Note (Signed)
Counseled to quit smoking.  

## 2015-12-09 NOTE — Addendum Note (Signed)
Addended by: Dorcas Carrow C on: 12/09/2015 11:41 AM   Modules accepted: Orders

## 2016-01-06 ENCOUNTER — Encounter: Payer: Self-pay | Admitting: Family Medicine

## 2016-01-06 ENCOUNTER — Ambulatory Visit (INDEPENDENT_AMBULATORY_CARE_PROVIDER_SITE_OTHER): Payer: Medicaid Other | Admitting: Family Medicine

## 2016-01-06 VITALS — BP 139/88 | HR 88 | Temp 98.2°F | Resp 16 | Ht 69.0 in | Wt 116.0 lb

## 2016-01-06 DIAGNOSIS — Z1211 Encounter for screening for malignant neoplasm of colon: Secondary | ICD-10-CM

## 2016-01-06 DIAGNOSIS — I10 Essential (primary) hypertension: Secondary | ICD-10-CM | POA: Diagnosis not present

## 2016-01-06 DIAGNOSIS — E871 Hypo-osmolality and hyponatremia: Secondary | ICD-10-CM

## 2016-01-06 NOTE — Assessment & Plan Note (Signed)
Controlled with metoprolol only.  Continue to hold amlodipine. Check BMET. Recheck in 3 mos.

## 2016-01-06 NOTE — Patient Instructions (Signed)
Continue to hold your amlodipine since your blood pressure is controlled without it.  Your goal blood pressure is 140/90 Work on low salt/sodium diet - goal <1.5gm (1,500mg ) per day. Eat a diet high in fruits/vegetables and whole grains.  Look into mediterranean and DASH diet. Goal activity is 121min/wk of moderate intensity exercise.  This can be split into 30 minute chunks.  If you are not at this level, you can start with smaller 10-15 min increments and slowly build up activity. Look at [email or web address] for more resources   Please seek immediate medical attention at ER or Urgent Care if you develop: Chest pain, pressure or tightness. Shortness of breath accompanied by nausea or diaphoresis Visual changes Numbness or tingling on one side of the body Facial droop Altered mental status Or any concerning symptoms.  Please get your sodium checked and continue to drink gatorade and eat plenty of food containing salt.

## 2016-01-06 NOTE — Progress Notes (Signed)
Subjective:    Patient ID: Donald Handy., male    DOB: [birth year], 55 y.o.   MRN: [id number]  HPI: Donald [name]. is a 55 y.o. male presenting on 01/06/2016 for Hypertension   HPI  Pt presents for hypertension follow-up.  Doing well at home No dizziness. At baseline shortness of breath. Was previously having low blood pressure. Stopped taking amlodipine 5mg  due to low blood pressure. Not checking BP at home.  Has issues with hyponatremia. Drinking gatorade at home to help. No dizziness or weakness.   Past Medical History  Diagnosis Date  . History of chicken pox   . Prostate disease   . Emphysema lung ([place])   . Seasonal allergies   . Essential hypertension   . Depression   . Seizures ([place])   . Emphysema of lung ([place])     Current Outpatient Prescriptions on File Prior to Visit  Medication Sig  . albuterol (PROVENTIL HFA;VENTOLIN HFA) 108 (90 BASE) MCG/ACT inhaler Inhale into the lungs every 6 (six) hours as needed for wheezing or shortness of breath.  Marland Kitchen aspirin 81 MG tablet Take 1 tablet (81 mg total) by mouth daily.  . Blood Pressure Monitoring (BLOOD PRESSURE CUFF) MISC 1 each by Does not apply route daily.  . capsaicin (ZOSTRIX) 0.025 % cream Apply topically 2 (two) times daily.  . metoprolol succinate (TOPROL-XL) 50 MG 24 hr tablet Take 1 tablet (50 mg total) by mouth daily.  . naproxen (NAPROSYN) 375 MG tablet Take 1 tablet (375 mg total) by mouth 2 (two) times daily with a meal.  . tamsulosin (FLOMAX) 0.4 MG CAPS capsule Take 1 capsule (0.4 mg total) by mouth daily after supper.   No current facility-administered medications on file prior to visit.    Review of Systems  Constitutional: Negative for fever and chills.  HENT: Negative.   Respiratory: Negative for chest tightness, shortness of breath and wheezing.   Cardiovascular: Negative for chest pain, palpitations and leg swelling.  Gastrointestinal: Negative for nausea, vomiting and abdominal pain.  Endocrine:  Negative.   Genitourinary: Negative for dysuria, urgency, discharge, penile pain and testicular pain.  Musculoskeletal: Negative for back pain, joint swelling and arthralgias.  Skin: Negative.   Neurological: Negative for dizziness, weakness, numbness and headaches.  Psychiatric/Behavioral: Negative for sleep disturbance and dysphoric mood.   Per HPI unless specifically indicated above     Objective:    BP 139/88 mmHg  Pulse 88  Temp(Src) 98.2 F (36.8 C) (Oral)  Resp 16  Ht 5\' 9"  (1.753 m)  Wt 116 lb (52.617 kg)  BMI 17.12 kg/m2  Wt Readings from Last 3 Encounters:  01/06/16 116 lb (52.617 kg)  02/20/15 114 lb (51.71 kg)  12/04/15 118 lb (53.524 kg)    Physical Exam  Constitutional: He is oriented to person, place, and time. He appears well-developed and well-nourished. No distress.  HENT:  Head: Normocephalic and atraumatic.  Neck: Neck supple. No thyromegaly present.  Cardiovascular: Normal rate, regular rhythm and normal heart sounds.  Exam reveals no gallop and no friction rub.   No murmur heard. Pulmonary/Chest: Effort normal and breath sounds normal. He has no wheezes.  Abdominal: Soft. Bowel sounds are normal. He exhibits no distension. There is no tenderness. There is no rebound.  Musculoskeletal: Normal range of motion. He exhibits no edema or tenderness.  Neurological: He is alert and oriented to person, place, and time. He has normal reflexes.  Skin: Skin is warm and dry. No  rash noted. No erythema.  Psychiatric: He has a normal mood and affect. His behavior is normal. Thought content normal.   Results for orders placed or performed in visit on 11/14/15  Comprehensive metabolic panel  Result Value Ref Range   Glucose 128 (H) 65 - 99 mg/dL   BUN 8 6 - 24 mg/dL   Creatinine, Ser 0.96 0.76 - 1.27 mg/dL   GFR calc non Af Amer 91 >59 mL/min/1.73   GFR calc Af Amer 105 >59 mL/min/1.73   BUN/Creatinine Ratio 8 (L) 9 - 20   Sodium 133 (L) 134 - 144 mmol/L    Potassium 3.7 3.5 - 5.2 mmol/L   Chloride 92 (L) 96 - 106 mmol/L   CO2 21 18 - 29 mmol/L   Calcium 9.8 8.7 - 10.2 mg/dL   Total Protein 7.9 6.0 - 8.5 g/dL   Albumin 4.3 3.5 - 5.5 g/dL   Globulin, Total 3.6 1.5 - 4.5 g/dL   Albumin/Globulin Ratio 1.2 1.1 - 2.5   Bilirubin Total 0.9 0.0 - 1.2 mg/dL   Alkaline Phosphatase 107 39 - 117 IU/L   AST 21 0 - 40 IU/L   ALT 16 0 - 44 IU/L  Osmolality  Result Value Ref Range   Osmolality Meas 281 275 - 295 mOsmol/kg  Osmolality, urine  Result Value Ref Range   Osmolality, Ur 379 mOsmol/kg  Sodium, urine, random  Result Value Ref Range   Sodium, Ur 29 Not Estab. mmol/L  Vitamin B12  Result Value Ref Range   Vitamin B-12 247 211 - 946 pg/mL  Folate  Result Value Ref Range   Folate 4.0 >3.0 ng/mL      Assessment & Plan:   Problem List Items Addressed This Visit      Cardiovascular and Mediastinum   Hypertension - Primary    Controlled with metoprolol only.  Continue to hold amlodipine. Check BMET. Recheck in 3 mos.         Other   Hyponatremia    Recheck sodium levels. If continued low, refer to nephrology for further evaluation.       Relevant Orders   Basic Metabolic Panel (BMET)    Other Visit Diagnoses    Screening for colon cancer        Relevant Orders    Ambulatory referral to General Surgery       No orders of the defined types were placed in this encounter.      Follow up plan: Return in about 3 months (around 04/07/2016) for HTN.

## 2016-01-06 NOTE — Assessment & Plan Note (Signed)
Recheck sodium levels. If continued low, refer to nephrology for further evaluation.

## 2016-01-10 ENCOUNTER — Telehealth: Payer: Self-pay

## 2016-01-10 ENCOUNTER — Other Ambulatory Visit: Payer: Self-pay

## 2016-01-10 MED ORDER — PEG 3350-KCL-NABCB-NACL-NASULF 236 G PO SOLR: 4000.0000 mL | Freq: Once | ORAL | Status: DC

## 2016-01-10 NOTE — Telephone Encounter (Signed)
Gastroenterology Pre-Procedure Review  Request Date: 02/25/16 Requesting Physician: Fredia Sorrow, NP  PATIENT REVIEW QUESTIONS: The patient responded to the following health history questions as indicated:    1. Are you having any GI issues? no 2. Do you have a personal history of Polyps? no 3. Do you have a family history of Colon Cancer or Polyps? no 4. Diabetes Mellitus? no 5. Joint replacements in the past 12 months?no 6. Major health problems in the past 3 months?no 7. Any artificial heart valves, MVP, or defibrillator?no    MEDICATIONS & ALLERGIES:    Patient reports the following regarding taking any anticoagulation/antiplatelet therapy:   Plavix, Coumadin, Eliquis, Xarelto, Lovenox, Pradaxa, Brilinta, or Effient? no Aspirin? yes (ASA 81mg )  Patient confirms/reports the following medications:  Current Outpatient Prescriptions  Medication Sig Dispense Refill  . albuterol (PROVENTIL HFA;VENTOLIN HFA) 108 (90 BASE) MCG/ACT inhaler Inhale into the lungs every 6 (six) hours as needed for wheezing or shortness of breath.    Marland Kitchen aspirin 81 MG tablet Take 1 tablet (81 mg total) by mouth daily. 30 tablet 11  . Blood Pressure Monitoring (BLOOD PRESSURE CUFF) MISC 1 each by Does not apply route daily. 1 each 0  . capsaicin (ZOSTRIX) 0.025 % cream Apply topically 2 (two) times daily. 60 g 1  . metoprolol succinate (TOPROL-XL) 50 MG 24 hr tablet Take 1 tablet (50 mg total) by mouth daily. 30 tablet 11  . naproxen (NAPROSYN) 375 MG tablet Take 1 tablet (375 mg total) by mouth 2 (two) times daily with a meal. 14 tablet 0  . tamsulosin (FLOMAX) 0.4 MG CAPS capsule Take 1 capsule (0.4 mg total) by mouth daily after supper. 30 capsule 11   No current facility-administered medications for this visit.    Patient confirms/reports the following allergies:  No Known Allergies  No orders of the defined types were placed in this encounter.    AUTHORIZATION INFORMATION Primary Insurance: 1D#: Group  #:  Secondary Insurance: 1D#: Group #:  SCHEDULE INFORMATION: Date: 02/25/16 Time: Location: [facility]

## 2016-01-20 DIAGNOSIS — F1021 Alcohol dependence, in remission: Secondary | ICD-10-CM | POA: Insufficient documentation

## 2016-01-20 DIAGNOSIS — G5793 Unspecified mononeuropathy of bilateral lower limbs: Secondary | ICD-10-CM | POA: Insufficient documentation

## 2016-01-20 DIAGNOSIS — R278 Other lack of coordination: Secondary | ICD-10-CM | POA: Insufficient documentation

## 2016-01-28 ENCOUNTER — Ambulatory Visit: Payer: Medicaid Other | Admitting: Family Medicine

## 2016-02-08 LAB — BASIC METABOLIC PANEL
BUN/Creatinine Ratio: 6 — ABNORMAL LOW (ref 9–20)
BUN: 5 mg/dL — ABNORMAL LOW (ref 6–24)
CALCIUM: 9.6 mg/dL (ref 8.7–10.2)
CHLORIDE: 85 mmol/L — AB (ref 96–106)
CO2: 18 mmol/L (ref 18–29)
Creatinine, Ser: 0.8 mg/dL (ref 0.76–1.27)
GFR calc Af Amer: 119 mL/min/{1.73_m2} (ref >59)
GFR, EST NON AFRICAN AMERICAN: 103 mL/min/{1.73_m2} (ref >59)
GLUCOSE: 79 mg/dL (ref 65–99)
POTASSIUM: 3.9 mmol/L (ref 3.5–5.2)
SODIUM: 125 mmol/L — AB (ref 134–144)

## 2016-02-10 ENCOUNTER — Telehealth: Payer: Self-pay | Admitting: Family Medicine

## 2016-02-10 DIAGNOSIS — E871 Hypo-osmolality and hyponatremia: Secondary | ICD-10-CM

## 2016-02-10 NOTE — Telephone Encounter (Signed)
Reviewed labs. Sodium is very low. Encouraged patient to switch to gatorade. Avoid lots of water. 1559ml fluid restriction. Reviewed alarm symptoms and urged patient to ER for weakness, confusion, fatigue, muscle cramps, seizures etc. Pt referred to nephrology urgently.

## 2016-02-12 ENCOUNTER — Telehealth: Payer: Self-pay | Admitting: Family Medicine

## 2016-02-12 NOTE — Telephone Encounter (Signed)
error 

## 2016-02-13 ENCOUNTER — Ambulatory Visit (INDEPENDENT_AMBULATORY_CARE_PROVIDER_SITE_OTHER): Payer: Medicaid Other | Admitting: Family Medicine

## 2016-02-13 ENCOUNTER — Encounter: Payer: Self-pay | Admitting: Family Medicine

## 2016-02-13 ENCOUNTER — Ambulatory Visit: Payer: Medicaid Other | Admitting: Family Medicine

## 2016-02-13 VITALS — BP 119/83 | HR 81 | Temp 97.7°F | Resp 16 | Ht 69.0 in | Wt 114.8 lb

## 2016-02-13 DIAGNOSIS — F17200 Nicotine dependence, unspecified, uncomplicated: Secondary | ICD-10-CM

## 2016-02-13 DIAGNOSIS — E871 Hypo-osmolality and hyponatremia: Secondary | ICD-10-CM

## 2016-02-13 DIAGNOSIS — W57XXXA Bitten or stung by nonvenomous insect and other nonvenomous arthropods, initial encounter: Secondary | ICD-10-CM | POA: Diagnosis not present

## 2016-02-13 DIAGNOSIS — T148 Other injury of unspecified body region: Secondary | ICD-10-CM | POA: Diagnosis not present

## 2016-02-13 DIAGNOSIS — Z720 Tobacco use: Secondary | ICD-10-CM | POA: Diagnosis not present

## 2016-02-13 LAB — BASIC METABOLIC PANEL WITH GFR
BUN: 6 mg/dL — AB (ref 7–25)
CHLORIDE: 90 mmol/L — AB (ref 98–110)
CO2: 23 mmol/L (ref 20–31)
Calcium: 9.8 mg/dL (ref 8.6–10.3)
Creat: 0.79 mg/dL (ref 0.70–1.33)
Glucose, Bld: 89 mg/dL (ref 65–99)
POTASSIUM: 4.9 mmol/L (ref 3.5–5.3)
Sodium: 128 mmol/L — ABNORMAL LOW (ref 135–146)

## 2016-02-13 MED ORDER — BUPROPION HCL ER (SR) 150 MG PO TB12: ORAL_TABLET | ORAL | Status: DC

## 2016-02-13 MED ORDER — DIPHENHYDRAMINE HCL 25 MG PO TABS: 25.0000 mg | ORAL_TABLET | Freq: Four times a day (QID) | ORAL | Status: DC | PRN

## 2016-02-13 MED ORDER — LORATADINE 10 MG PO TABS: 10.0000 mg | ORAL_TABLET | Freq: Every day | ORAL | Status: DC

## 2016-02-13 NOTE — Assessment & Plan Note (Signed)
Start Wellbutrin for smoking cessation. 150mg  SR BID. Reviewed side effects and benefits.

## 2016-02-13 NOTE — Assessment & Plan Note (Signed)
Referral to nephrology placed. Appt is June 12. Stat BMP done today. Pt is asymptomatic at this time. Pt advised to limit fluid to 1571ml per day. Drinking gatorade. Adding salt to food. Pt denies ETOH use at this time.  Alarm symptoms reviewed. To ER with alarm symptoms.

## 2016-02-13 NOTE — Patient Instructions (Signed)
Bupropion sustained-release tablets (smoking cessation) What is this medicine? BUPROPION (byoo PROE pee on) is used to help people quit smoking. This medicine may be used for other purposes; ask your health care provider or pharmacist if you have questions. What should I tell my health care provider before I take this medicine? They need to know if you have any of these conditions: -an eating disorder, such as anorexia or bulimia -bipolar disorder or psychosis -diabetes or high blood sugar, treated with medication -glaucoma -head injury or brain tumor -heart disease, previous heart attack, or irregular heart beat -high blood pressure -kidney or liver disease -seizures -suicidal thoughts or a previous suicide attempt -Tourette's syndrome -weight loss -an unusual or allergic reaction to bupropion, other medicines, foods, dyes, or preservatives -breast-feeding -pregnant or trying to become pregnant How should I use this medicine? Take this medicine by mouth with a glass of water. Follow the directions on the prescription label. You can take it with or without food. If it upsets your stomach, take it with food. Do not cut, crush or chew this medicine. Take your medicine at regular intervals. If you take this medicine more than once a day, take your second dose at least 8 hours after you take your first dose. To limit difficulty in sleeping, avoid taking this medicine at bedtime. Do not take your medicine more often than directed. Do not stop taking this medicine suddenly except upon the advice of your doctor. Stopping this medicine too quickly may cause serious side effects. A special MedGuide will be given to you by the pharmacist with each prescription and refill. Be sure to read this information carefully each time. Talk to your pediatrician regarding the use of this medicine in children. Special care may be needed. Overdosage: If you think you have taken too much of this medicine contact a  poison control center or emergency room at once. NOTE: This medicine is only for you. Do not share this medicine with others. What if I miss a dose? If you miss a dose, skip the missed dose and take your next tablet at the regular time. There should be at least 8 hours between doses. Do not take double or extra doses. What may interact with this medicine? Do not take this medicine with any of the following medications: -linezolid -MAOIs like Azilect, Carbex, Eldepryl, Marplan, Nardil, and Parnate -methylene blue (injected into a vein) -other medicines that contain bupropion like Wellbutrin This medicine may also interact with the following medications: -alcohol -certain medicines for anxiety or sleep -certain medicines for blood pressure like metoprolol, propranolol -certain medicines for depression or psychotic disturbances -certain medicines for HIV or AIDS like efavirenz, lopinavir, nelfinavir, ritonavir -certain medicines for irregular heart beat like propafenone, flecainide -certain medicines for Parkinson's disease like amantadine, levodopa -certain medicines for seizures like carbamazepine, phenytoin, phenobarbital -cimetidine -clopidogrel -cyclophosphamide -furazolidone -isoniazid -nicotine -orphenadrine -procarbazine -steroid medicines like prednisone or cortisone -stimulant medicines for attention disorders, weight loss, or to stay awake -tamoxifen -theophylline -thiotepa -ticlopidine -tramadol -warfarin This list may not describe all possible interactions. Give your health care provider a list of all the medicines, herbs, non-prescription drugs, or dietary supplements you use. Also tell them if you smoke, drink alcohol, or use illegal drugs. Some items may interact with your medicine. What should I watch for while using this medicine? Visit your doctor or health care professional for regular checks on your progress. This medicine should be used together with a patient  support program. It is important   to participate in a behavioral program, counseling, or other support program that is recommended by your health care professional. Patients and their families should watch out for new or worsening thoughts of suicide or depression. Also watch out for sudden changes in feelings such as feeling anxious, agitated, panicky, irritable, hostile, aggressive, impulsive, severely restless, overly excited and hyperactive, or not being able to sleep. If this happens, especially at the beginning of treatment or after a change in dose, call your health care professional. Avoid alcoholic drinks while taking this medicine. Drinking excessive alcoholic beverages, using sleeping or anxiety medicines, or quickly stopping the use of these agents while taking this medicine may increase your risk for a seizure. Do not drive or use heavy machinery until you know how this medicine affects you. This medicine can impair your ability to perform these tasks. Do not take this medicine close to bedtime. It may prevent you from sleeping. Your mouth may get dry. Chewing sugarless gum or sucking hard candy, and drinking plenty of water may help. Contact your doctor if the problem does not go away or is severe. Do not use nicotine patches or chewing gum without the advice of your doctor or health care professional while taking this medicine. You may need to have your blood pressure taken regularly if your doctor recommends that you use both nicotine and this medicine together. What side effects may I notice from receiving this medicine? Side effects that you should report to your doctor or health care professional as soon as possible: -allergic reactions like skin rash, itching or hives, swelling of the face, lips, or tongue -breathing problems -changes in vision -confusion -fast or irregular heartbeat -hallucinations -increased blood pressure -redness, blistering, peeling or loosening of the skin,  including inside the mouth -seizures -suicidal thoughts or other mood changes -unusually weak or tired -vomiting Side effects that usually do not require medical attention (report to your doctor or health care professional if they continue or are bothersome): -change in sex drive or performance -constipation -headache -loss of appetite -nausea -tremors -weight loss This list may not describe all possible side effects. Call your doctor for medical advice about side effects. You may report side effects to FDA at 1-800-FDA-1088. Where should I keep my medicine? Keep out of the reach of children. Store at room temperature between 20 and 25 degrees C (68 and 77 degrees F). Protect from light. Keep container tightly closed. Throw away any unused medicine after the expiration date. NOTE: This sheet is a summary. It may not cover all possible information. If you have questions about this medicine, talk to your doctor, pharmacist, or health care provider.    2016, [employer]. (2013-04-21 10:55:10)  

## 2016-02-13 NOTE — Progress Notes (Signed)
Subjective:    Patient ID: Donald [name]., male    DOB: [birth year], 55 y.o.   MRN: [id number]  HPI: Donald [name]. is a 55 y.o. male presenting on 02/13/2016 for low sodium   HPI  Patient presents today to follow up for hyponatremia. Has been drinking approximately 20oz of Gatorade daily and limiting fluid intake as directed. Reports some dizziness "not that much" that started "a long time ago." States that sometimes when he "stands up fast and walks to the sink" he may have to stand there a minute. Denies muscle cramps, weakness, confusion, gait disturbances, chest pain, increased shortness of breath, increased blurred vision or syncopal episodes.  Smoker: avg. 1 PPD- would like smoking cessation medication, has tried patch and nicotine gum without success.   Has multiple lesions mainly on arms...states he has a problem with "bed bugs" that he has had the exterminator out to his house for treatment but cannot seem to get rid of them. Itching worse at night and when he wakes up in the morning.  Past Medical History  Diagnosis Date  . History of chicken pox   . Prostate disease   . Emphysema lung ([place])   . Seasonal allergies   . Essential hypertension   . Depression   . Seizures ([place])   . Emphysema of lung ([place])   . Low weight     Current Outpatient Prescriptions on File Prior to Visit  Medication Sig  . albuterol (PROVENTIL HFA;VENTOLIN HFA) 108 (90 BASE) MCG/ACT inhaler Inhale into the lungs every 6 (six) hours as needed for wheezing or shortness of breath.  Marland Kitchen aspirin 81 MG tablet Take 1 tablet (81 mg total) by mouth daily.  . Blood Pressure Monitoring (BLOOD PRESSURE CUFF) MISC 1 each by Does not apply route daily.  . capsaicin (ZOSTRIX) 0.025 % cream Apply topically 2 (two) times daily.  . metoprolol succinate (TOPROL-XL) 50 MG 24 hr tablet Take 1 tablet (50 mg total) by mouth daily.  . naproxen (NAPROSYN) 375 MG tablet Take 1 tablet (375 mg total) by mouth 2 (two) times  daily with a meal.  . polyethylene glycol (GOLYTELY) 236 g solution Take 4,000 mLs by mouth once. Drink one 8oz glass every 30 mins until stools are clear.  . tamsulosin (FLOMAX) 0.4 MG CAPS capsule Take 1 capsule (0.4 mg total) by mouth daily after supper.   No current facility-administered medications on file prior to visit.    Review of Systems  Constitutional: Negative for fever, chills, activity change, appetite change, fatigue and unexpected weight change.  HENT: Negative for congestion, ear pain, mouth sores and trouble swallowing.   Eyes: Negative for pain and itching.  Respiratory: Positive for shortness of breath. Negative for cough, chest tightness and wheezing.        Patient has a history of COPD...states no worse than baseline  Cardiovascular: Negative for chest pain, palpitations and leg swelling.  Gastrointestinal: Negative for nausea, vomiting, abdominal pain and diarrhea.  Endocrine: Negative for cold intolerance, heat intolerance, polydipsia, polyphagia and polyuria.  Genitourinary: Negative for dysuria, frequency and difficulty urinating.  Musculoskeletal: Negative for myalgias and neck pain.  Skin: Positive for wound. Negative for color change.       "bug bites"  Allergic/Immunologic: Negative for environmental allergies and food allergies.  Neurological: Positive for dizziness. Negative for tremors, seizures, syncope, weakness, light-headedness, numbness and headaches.       Intermittent and chronic per patient that is no worse than  baseline for him   Hematological: Does not bruise/bleed easily.  Psychiatric/Behavioral: Negative for hallucinations, confusion, sleep disturbance and agitation. The patient is not nervous/anxious.    Per HPI unless specifically indicated above     Objective:    BP 119/83 mmHg  Pulse 81  Temp(Src) 97.7 F (36.5 C) (Oral)  Resp 16  Ht 5\' 9"  (1.753 m)  Wt 114 lb 12.8 oz (52.073 kg)  BMI 16.95 kg/m2  Wt Readings from Last 3  Encounters:  02/13/16 114 lb 12.8 oz (52.073 kg)  01/06/16 116 lb (52.617 kg)  02/20/15 114 lb (51.71 kg)    Physical Exam  Constitutional: He is oriented to person, place, and time. Vital signs are normal. He appears well-developed. He is cooperative.  HENT:  Head: Normocephalic and atraumatic.  Eyes: Pupils are equal, round, and reactive to light.  Neck: Neck supple. No JVD present.  Cardiovascular: Normal rate, regular rhythm, normal heart sounds and intact distal pulses.  Exam reveals no gallop and no friction rub.   No murmur heard. Pulmonary/Chest: Effort normal and breath sounds normal. No respiratory distress. He has no wheezes. He has no rales. He exhibits no tenderness.  Abdominal: Soft. Bowel sounds are normal. He exhibits no distension and no mass. There is no tenderness.  Musculoskeletal: Normal range of motion. He exhibits no edema or tenderness.  Neurological: He is alert and oriented to person, place, and time.  Skin: Skin is warm and dry. Lesion noted. He is not diaphoretic. No pallor.      Results for orders placed or performed in visit on 11/14/15  Comprehensive metabolic panel  Result Value Ref Range   Glucose 128 (H) 65 - 99 mg/dL   BUN 8 6 - 24 mg/dL   Creatinine, Ser 0.96 0.76 - 1.27 mg/dL   GFR calc non Af Amer 91 >59 mL/min/1.73   GFR calc Af Amer 105 >59 mL/min/1.73   BUN/Creatinine Ratio 8 (L) 9 - 20   Sodium 133 (L) 134 - 144 mmol/L   Potassium 3.7 3.5 - 5.2 mmol/L   Chloride 92 (L) 96 - 106 mmol/L   CO2 21 18 - 29 mmol/L   Calcium 9.8 8.7 - 10.2 mg/dL   Total Protein 7.9 6.0 - 8.5 g/dL   Albumin 4.3 3.5 - 5.5 g/dL   Globulin, Total 3.6 1.5 - 4.5 g/dL   Albumin/Globulin Ratio 1.2 1.1 - 2.5   Bilirubin Total 0.9 0.0 - 1.2 mg/dL   Alkaline Phosphatase 107 39 - 117 IU/L   AST 21 0 - 40 IU/L   ALT 16 0 - 44 IU/L  Osmolality  Result Value Ref Range   Osmolality Meas 281 275 - 295 mOsmol/kg  Osmolality, urine  Result Value Ref Range   Osmolality,  Ur 379 mOsmol/kg  Sodium, urine, random  Result Value Ref Range   Sodium, Ur 29 Not Estab. mmol/L  Vitamin B12  Result Value Ref Range   Vitamin B-12 247 211 - 946 pg/mL  Folate  Result Value Ref Range   Folate 4.0 >3.0 ng/mL      Assessment & Plan:   Problem List Items Addressed This Visit      Other   Smoker    Start Wellbutrin for smoking cessation. 150mg  SR BID. Reviewed side effects and benefits.       Relevant Medications   buPROPion (WELLBUTRIN SR) 150 MG 12 hr tablet   Hyponatremia - Primary    Referral to nephrology placed. Appt is June 12.  Stat BMP done today. Pt is asymptomatic at this time. Pt advised to limit fluid to 151ml per day. Drinking gatorade. Adding salt to food. Pt denies ETOH use at this time.  Alarm symptoms reviewed. To ER with alarm symptoms.       Relevant Orders   BASIC METABOLIC PANEL WITH GFR    Other Visit Diagnoses    Insect bite        Relevant Medications    loratadine (CLARITIN) 10 MG tablet    diphenhydrAMINE (BENADRYL) 25 MG tablet       Meds ordered this encounter  Medications  . buPROPion (WELLBUTRIN SR) 150 MG 12 hr tablet    Sig: Take 1 tablet once daily for 1 week and then increase to 1 tablet twice daily.    Dispense:  60 tablet    Refill:  2    Order Specific Question:  Supervising Provider    Answer:  Arlis Porta [phone]  . loratadine (CLARITIN) 10 MG tablet    Sig: Take 1 tablet (10 mg total) by mouth daily.    Dispense:  30 tablet    Refill:  11    Order Specific Question:  Supervising Provider    Answer:  Arlis Porta [phone]  . diphenhydrAMINE (BENADRYL) 25 MG tablet    Sig: Take 1 tablet (25 mg total) by mouth every 6 (six) hours as needed.    Dispense:  30 tablet    Refill:  0    Order Specific Question:  Supervising Provider    Answer:  Arlis Porta [id number]      Follow up plan: Return in about 2 months (around 04/14/2016).

## 2016-02-18 ENCOUNTER — Ambulatory Visit: Payer: Medicaid Other | Admitting: Family Medicine

## 2016-02-25 ENCOUNTER — Ambulatory Visit
Admission: RE | Admit: 2016-02-25 | Discharge: 2016-02-25 | Disposition: A | Discharge status: Home / Self Care | Payer: Medicaid Other | Source: Ambulatory Visit | Attending: Gastroenterology | Admitting: Gastroenterology

## 2016-02-25 ENCOUNTER — Encounter
Admission: RE | Disposition: A | Discharge status: Home / Self Care | Payer: Self-pay | Source: Ambulatory Visit | Attending: Gastroenterology

## 2016-02-25 ENCOUNTER — Ambulatory Visit: Payer: Medicaid Other | Admitting: Anesthesiology

## 2016-02-25 ENCOUNTER — Encounter: Payer: Self-pay | Admitting: Anesthesiology

## 2016-02-25 DIAGNOSIS — R0602 Shortness of breath: Secondary | ICD-10-CM | POA: Diagnosis not present

## 2016-02-25 DIAGNOSIS — J439 Emphysema, unspecified: Secondary | ICD-10-CM | POA: Insufficient documentation

## 2016-02-25 DIAGNOSIS — Z1211 Encounter for screening for malignant neoplasm of colon: Secondary | ICD-10-CM | POA: Diagnosis present

## 2016-02-25 DIAGNOSIS — I10 Essential (primary) hypertension: Secondary | ICD-10-CM | POA: Diagnosis not present

## 2016-02-25 DIAGNOSIS — G709 Myoneural disorder, unspecified: Secondary | ICD-10-CM | POA: Diagnosis not present

## 2016-02-25 DIAGNOSIS — D125 Benign neoplasm of sigmoid colon: Secondary | ICD-10-CM | POA: Diagnosis not present

## 2016-02-25 DIAGNOSIS — D120 Benign neoplasm of cecum: Secondary | ICD-10-CM | POA: Diagnosis not present

## 2016-02-25 DIAGNOSIS — D123 Benign neoplasm of transverse colon: Secondary | ICD-10-CM | POA: Diagnosis not present

## 2016-02-25 DIAGNOSIS — F329 Major depressive disorder, single episode, unspecified: Secondary | ICD-10-CM | POA: Diagnosis not present

## 2016-02-25 DIAGNOSIS — G40909 Epilepsy, unspecified, not intractable, without status epilepticus: Secondary | ICD-10-CM | POA: Diagnosis not present

## 2016-02-25 DIAGNOSIS — F17210 Nicotine dependence, cigarettes, uncomplicated: Secondary | ICD-10-CM | POA: Insufficient documentation

## 2016-02-25 DIAGNOSIS — Z79899 Other long term (current) drug therapy: Secondary | ICD-10-CM | POA: Insufficient documentation

## 2016-02-25 DIAGNOSIS — Z7982 Long term (current) use of aspirin: Secondary | ICD-10-CM | POA: Diagnosis not present

## 2016-02-25 HISTORY — PX: COLONOSCOPY WITH PROPOFOL: SHX5780

## 2016-02-25 SURGERY — COLONOSCOPY WITH PROPOFOL
Anesthesia: General

## 2016-02-25 MED ORDER — PROPOFOL 10 MG/ML IV BOLUS
INTRAVENOUS | Status: DC | PRN
  Administered 2016-02-25: 30 mg via INTRAVENOUS
  Administered 2016-02-25: 50 mg via INTRAVENOUS

## 2016-02-25 MED ORDER — SODIUM CHLORIDE 0.9 % IV SOLN
INTRAVENOUS | Status: DC
  Administered 2016-02-25: 1000 mL via INTRAVENOUS

## 2016-02-25 MED ORDER — LIDOCAINE HCL (CARDIAC) 20 MG/ML IV SOLN
INTRAVENOUS | Status: DC | PRN
  Administered 2016-02-25: 30 mg via INTRAVENOUS

## 2016-02-25 MED ORDER — PROPOFOL 500 MG/50ML IV EMUL
INTRAVENOUS | Status: DC | PRN
  Administered 2016-02-25: 150 ug/kg/min via INTRAVENOUS

## 2016-02-25 NOTE — Anesthesia Preprocedure Evaluation (Signed)
Anesthesia Evaluation  Patient identified by MRN, date of birth, ID band Patient awake    Reviewed: Allergy & Precautions, H&P , NPO status , Patient's Chart, lab work & pertinent test results, reviewed documented beta blocker date and time   History of Anesthesia Complications Negative for: history of anesthetic complications  Airway Mallampati: III  TM Distance: >3 FB Neck ROM: full    Dental no notable dental hx. (+) Edentulous Upper, Upper Dentures, Missing, Poor Dentition, Chipped   Pulmonary shortness of breath and with exertion, neg sleep apnea, COPD,  COPD inhaler, neg recent URI, Current Smoker,    Pulmonary exam normal breath sounds clear to auscultation       Cardiovascular Exercise Tolerance: Good hypertension, (-) angina+ Peripheral Vascular Disease  (-) CAD, (-) Past MI, (-) Cardiac Stents and (-) CABG Normal cardiovascular exam(-) dysrhythmias (-) Valvular Problems/Murmurs Rhythm:regular Rate:Normal     Neuro/Psych Seizures -, Well Controlled,  PSYCHIATRIC DISORDERS (Depression)  Neuromuscular disease    GI/Hepatic negative GI ROS, Neg liver ROS,   Endo/Other  negative endocrine ROS  Renal/GU negative Renal ROS  negative genitourinary   Musculoskeletal   Abdominal   Peds  Hematology negative hematology ROS (+)   Anesthesia Other Findings Past Medical History:   History of chicken pox                                       Prostate disease                                             Emphysema lung ([facility])                                         Seasonal allergies                                           Essential hypertension                                       Depression                                                   Seizures ([facility])                                               Emphysema of lung ([facility])                                      Low weight  Reproductive/Obstetrics negative OB ROS                             Anesthesia Physical Anesthesia Plan  ASA: III  Anesthesia Plan: General   Post-op Pain Management:    Induction:   Airway Management Planned:   Additional Equipment:   Intra-op Plan:   Post-operative Plan:   Informed Consent: I have reviewed the patients History and Physical, chart, labs and discussed the procedure including the risks, benefits and alternatives for the proposed anesthesia with the patient or authorized representative who has indicated his/her understanding and acceptance.   Dental Advisory Given  Plan Discussed with: Anesthesiologist, CRNA and Surgeon  Anesthesia Plan Comments:         Anesthesia Quick Evaluation

## 2016-02-25 NOTE — Anesthesia Postprocedure Evaluation (Signed)
Anesthesia Post Note  Patient: Donald [name].  Procedure(s) Performed: Procedure(s) (LRB): COLONOSCOPY WITH PROPOFOL (N/A)  Patient location during evaluation: Endoscopy Anesthesia Type: General Level of consciousness: awake and alert Pain management: pain level controlled Vital Signs Assessment: post-procedure vital signs reviewed and stable Respiratory status: spontaneous breathing, nonlabored ventilation, respiratory function stable and patient connected to nasal cannula oxygen Cardiovascular status: blood pressure returned to baseline and stable Postop Assessment: no signs of nausea or vomiting Anesthetic complications: no    Last Vitals:  Filed Vitals:   02/25/16 0813 02/25/16 1015  BP: 166/95   Pulse: 85   Temp: 35.8 C 36.2 C  Resp: 16     Last Pain: There were no vitals filed for this visit.               Martha Clan

## 2016-02-25 NOTE — Transfer of Care (Signed)
Immediate Anesthesia Transfer of Care Note  Patient: Donald [name].  Procedure(s) Performed: Procedure(s): COLONOSCOPY WITH PROPOFOL (N/A)  Patient Location: Endoscopy Unit  Anesthesia Type:General  Level of Consciousness: awake, alert  and oriented  Airway & Oxygen Therapy: Patient Spontanous Breathing and Patient connected to nasal cannula oxygen  Post-op Assessment: Report given to RN and Post -op Vital signs reviewed and stable  Post vital signs: Reviewed and stable  Last Vitals:  Filed Vitals:   02/25/16 0813  BP: 166/95  Pulse: 85  Temp: 35.8 C  Resp: 16    Last Pain: There were no vitals filed for this visit.       Complications: No apparent anesthesia complications

## 2016-02-25 NOTE — H&P (Signed)
Donald Lame, MD [address] [place],  [address] Phone: [phone] Fax : [phone]  Primary Care Physician:  Leata Mouse, NP Primary Gastroenterologist:  Dr. Allen Norris  Pre-Procedure History & Physical: HPI:  Donald [name]. is a 55 y.o. male is here for a screening colonoscopy.   Past Medical History  Diagnosis Date  . History of chicken pox   . Prostate disease   . Emphysema lung ([place])   . Seasonal allergies   . Essential hypertension   . Depression   . Seizures ([place])   . Emphysema of lung ([place])   . Low weight     Past Surgical History  Procedure Laterality Date  . None    . Eye surgery  2015    Cataract    Prior to Admission medications   Medication Sig Start Date End Date Taking? Authorizing Provider  albuterol (PROVENTIL HFA;VENTOLIN HFA) 108 (90 BASE) MCG/ACT inhaler Inhale into the lungs every 6 (six) hours as needed for wheezing or shortness of breath.   Yes Historical Provider, MD  aspirin 81 MG tablet Take 1 tablet (81 mg total) by mouth daily. 07/29/15  Yes Amy Overton Mam, NP  Blood Pressure Monitoring (BLOOD PRESSURE CUFF) MISC 1 each by Does not apply route daily. 11/28/15  Yes Amy Overton Mam, NP  buPROPion (WELLBUTRIN SR) 150 MG 12 hr tablet Take 1 tablet once daily for 1 week and then increase to 1 tablet twice daily. 02/13/16  Yes Amy Overton Mam, NP  capsaicin (ZOSTRIX) 0.025 % cream Apply topically 2 (two) times daily. 11/28/15  Yes Amy Overton Mam, NP  diphenhydrAMINE (BENADRYL) 25 MG tablet Take 1 tablet (25 mg total) by mouth every 6 (six) hours as needed. 02/13/16  Yes Amy Overton Mam, NP  loratadine (CLARITIN) 10 MG tablet Take 1 tablet (10 mg total) by mouth daily. 02/13/16  Yes Amy Overton Mam, NP  metoprolol succinate (TOPROL-XL) 50 MG 24 hr tablet Take 1 tablet (50 mg total) by mouth daily. 10/29/15  Yes Amy Overton Mam, NP  naproxen (NAPROSYN) 375 MG tablet Take 1 tablet (375 mg total) by mouth 2 (two) times daily with a  meal. 11/28/15  Yes Amy Overton Mam, NP  polyethylene glycol (GOLYTELY) 236 g solution Take 4,000 mLs by mouth once. Drink one 8oz glass every 30 mins until stools are clear. 01/10/16  Yes Donald Lame, MD  tamsulosin (FLOMAX) 0.4 MG CAPS capsule Take 1 capsule (0.4 mg total) by mouth daily after supper. 07/29/15  Yes Amy Overton Mam, NP    Allergies as of 01/10/2016  . (No Known Allergies)    Family History  Problem Relation Age of Onset  . Ulcers Father     bleeding  . Cancer Mother     Breast    Social History   Social History  . Marital Status: Single    Spouse Name: N/A  . Number of Children: N/A  . Years of Education: N/A   Occupational History  . Not on file.   Social History Main Topics  . Smoking status: Current Every Day Smoker -- 0.50 packs/day    Types: Cigarettes  . Smokeless tobacco: Not on file  . Alcohol Use: 0.0 oz/week    0 Standard drinks or equivalent per week  . Drug Use: No  . Sexual Activity: Not on file   Other Topics Concern  . Not on file   Social History Narrative    Review of Systems: See HPI,  otherwise negative ROS  Physical Exam: BP 166/95 mmHg  Pulse 85  Temp(Src) 96.4 F (35.8 C) (Tympanic)  Resp 16  SpO2 100% General:   Alert,  pleasant and cooperative in NAD Head:  Normocephalic and atraumatic. Neck:  Supple; no masses or thyromegaly. Lungs:  Clear throughout to auscultation.    Heart:  Regular rate and rhythm. Abdomen:  Soft, nontender and nondistended. Normal bowel sounds, without guarding, and without rebound.   Neurologic:  Alert and  oriented x4;  grossly normal neurologically.  Impression/Plan: [name]. is now here to undergo a screening colonoscopy.  Risks, benefits, and alternatives regarding colonoscopy have been reviewed with the patient.  Questions have been answered.  All parties agreeable.

## 2016-02-25 NOTE — Op Note (Addendum)
[name] Patient Name: Donald [name] Procedure Date: 02/25/2016 9:37 AM MRN: [id number] Account #: [id number] Date of Birth: 1960/11/22 Admit Type: Outpatient Age: 55 Room: [facility] ENDO ROOM 4 Gender: Male Note Status: Finalized Procedure:            Colonoscopy Indications:          Screening for colorectal malignant neoplasm Providers:            [name], MD Referring MD:         Leata Mouse (Referring MD) Medicines:            Propofol per Anesthesia Complications:        No immediate complications. Procedure:            Pre-Anesthesia Assessment:                       - Prior to the procedure, a History and Physical was                        performed, and patient medications and allergies were                        reviewed. The patient's tolerance of previous                        anesthesia was also reviewed. The risks and benefits of                        the procedure and the sedation options and risks were                        discussed with the patient. All questions were                        answered, and informed consent was obtained. Prior                        Anticoagulants: The patient has taken no previous                        anticoagulant or antiplatelet agents. ASA Grade                        Assessment: II - A patient with mild systemic disease.                        After reviewing the risks and benefits, the patient was                        deemed in satisfactory condition to undergo the                        procedure.                       After obtaining informed consent, the colonoscope was                        passed under direct vision. Throughout the procedure,  the patient's blood pressure, pulse, and oxygen                        saturations were monitored continuously. The                        Colonoscope was introduced through the anus and   advanced to the the cecum, identified by appendiceal                        orifice and ileocecal valve. The colonoscopy was                        performed without difficulty. The patient tolerated the                        procedure well. The quality of the bowel preparation                        was excellent. Findings:      The perianal and digital rectal examinations were normal.      A 2 mm polyp was found in the cecum. The polyp was sessile. The polyp       was removed with a cold biopsy forceps. Resection and retrieval were       complete.      Two pedunculated polyps were found in the sigmoid colon. The polyps were       4 to 5 mm in size. These polyps were removed with a cold snare.       Resection and retrieval were complete.      A 4 mm polyp was found in the transverse colon. The polyp was sessile.       The polyp was removed with a cold snare. Resection and retrieval were       complete. Impression:           - One 2 mm polyp in the cecum, removed with a cold                        biopsy forceps. Resected and retrieved.                       - Two 4 to 5 mm polyps in the sigmoid colon, removed                        with a cold snare. Resected and retrieved.                       - One 4 mm polyp in the transverse colon, removed with                        a cold snare. Resected and retrieved. Recommendation:       - Await pathology results.                       - Repeat colonoscopy in 5 years if polyp adenoma and 10                        years if hyperplastic Procedure Code(s):    --- Professional ---  45385, Colonoscopy, flexible; with removal of tumor(s),                        polyp(s), or other lesion(s) by snare technique                       45380, 59, Colonoscopy, flexible; with biopsy, single                        or multiple Diagnosis Code(s):    --- Professional ---                       Z12.11, Encounter for screening for malignant  neoplasm                        of colon                       D12.0, Benign neoplasm of cecum                       D12.3, Benign neoplasm of transverse colon (hepatic                        flexure or splenic flexure)                       D12.5, Benign neoplasm of sigmoid colon CPT copyright 2016 American Medical Association. All rights reserved. The codes documented in this report are preliminary and upon coder review may  be revised to meet current compliance requirements. [name], MD 02/25/2016 10:12:52 AM This report has been signed electronically. Number of Addenda: 0 Note Initiated On: 02/25/2016 9:37 AM Scope Withdrawal Time: 0 hours 5 minutes 50 seconds  Total Procedure Duration: 0 hours 10 minutes 38 seconds       [facility]

## 2016-02-26 LAB — SURGICAL PATHOLOGY

## 2016-02-27 ENCOUNTER — Encounter: Payer: Self-pay | Admitting: Gastroenterology

## 2016-03-09 ENCOUNTER — Telehealth: Payer: Self-pay | Admitting: Family Medicine

## 2016-03-09 NOTE — Telephone Encounter (Signed)
Pt's mother, Blanch Media wanted to see if pt would qualify to get  into some kind of assisted living.  Her call back number is [phone]

## 2016-03-09 NOTE — Telephone Encounter (Signed)
Patient's mother says he has appt next month. Mother has found patient a place to live. She says she had some other things that she will discuss at next follow up.Donald [name]

## 2016-04-07 ENCOUNTER — Ambulatory Visit (INDEPENDENT_AMBULATORY_CARE_PROVIDER_SITE_OTHER): Payer: Medicaid Other | Admitting: Family Medicine

## 2016-04-07 ENCOUNTER — Encounter: Payer: Self-pay | Admitting: Family Medicine

## 2016-04-07 VITALS — BP 160/106 | HR 79 | Temp 97.5°F | Ht 68.0 in | Wt 112.0 lb

## 2016-04-07 DIAGNOSIS — Z720 Tobacco use: Secondary | ICD-10-CM

## 2016-04-07 DIAGNOSIS — J449 Chronic obstructive pulmonary disease, unspecified: Secondary | ICD-10-CM

## 2016-04-07 DIAGNOSIS — F1010 Alcohol abuse, uncomplicated: Secondary | ICD-10-CM

## 2016-04-07 DIAGNOSIS — G6289 Other specified polyneuropathies: Secondary | ICD-10-CM

## 2016-04-07 DIAGNOSIS — E871 Hypo-osmolality and hyponatremia: Secondary | ICD-10-CM

## 2016-04-07 DIAGNOSIS — F17200 Nicotine dependence, unspecified, uncomplicated: Secondary | ICD-10-CM

## 2016-04-07 DIAGNOSIS — I10 Essential (primary) hypertension: Secondary | ICD-10-CM

## 2016-04-07 LAB — CBC WITH DIFFERENTIAL/PLATELET
BASOS ABS: 0 {cells}/uL (ref 0–200)
Basophils Relative: 0 %
EOS PCT: 2 %
Eosinophils Absolute: 144 cells/uL (ref 15–500)
HCT: 44.4 % (ref 38.5–50.0)
HEMOGLOBIN: 15.9 g/dL (ref 13.2–17.1)
LYMPHS PCT: 12 %
Lymphs Abs: 864 cells/uL (ref 850–3900)
MCH: 35.1 pg — AB (ref 27.0–33.0)
MCHC: 35.8 g/dL (ref 32.0–36.0)
MCV: 98 fL (ref 80.0–100.0)
MONOS PCT: 14 %
MPV: 9.5 fL (ref 7.5–12.5)
Monocytes Absolute: 1008 cells/uL — ABNORMAL HIGH (ref 200–950)
NEUTROS PCT: 72 %
Neutro Abs: 5184 cells/uL (ref 1500–7800)
PLATELETS: 194 10*3/uL (ref 140–400)
RBC: 4.53 MIL/uL (ref 4.20–5.80)
RDW: 13.8 % (ref 11.0–15.0)
WBC: 7.2 10*3/uL (ref 3.8–10.8)

## 2016-04-07 LAB — TSH: TSH: 2.71 m[IU]/L (ref 0.40–4.50)

## 2016-04-07 MED ORDER — AMLODIPINE BESYLATE 5 MG PO TABS: 5.0000 mg | ORAL_TABLET | Freq: Every day | ORAL | 11 refills | Status: DC

## 2016-04-07 MED ORDER — FLUTICASONE-SALMETEROL 250-50 MCG/DOSE IN AEPB
1.0000 | INHALATION_SPRAY | Freq: Two times a day (BID) | RESPIRATORY_TRACT | 11 refills | Status: DC
Start: 2016-04-07 — End: 2017-07-06

## 2016-04-07 NOTE — Assessment & Plan Note (Signed)
Appears at baseline today. Last Spirometry shows moderate to severe obstruction. Will start Advair twice daily. Repeat spirometry. Consider pulmonology referral with worsening COPD.

## 2016-04-07 NOTE — Assessment & Plan Note (Signed)
Pt requesting antiabuse. Have referred him to [facility] for outpatient substance abuse counseling. AA references given as well.

## 2016-04-07 NOTE — Assessment & Plan Note (Signed)
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 However pt reports heavy drinking today in the office. Will recheck renal function. Plan to [facility] on lab work. Continue sodium as prescribed by nephrology daily. Continue boost once daily.

## 2016-04-07 NOTE — Progress Notes (Signed)
Subjective:    Patient ID: Donald Handy., male    DOB: 05/08/1961, 54 y.o.   MRN: [id number]  HPI: Donald Westfahl. is a 55 y.o. male presenting on 04/07/2016 for Hypertension   HPI  Pt presents for hypertension follow-up.  Hyponatremia: Seeing nephrology. 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 Pt also states today that he wants to quit drinking. State now he drink 5-6 beers per day. Previously stated he was not drinking alcohol. Taking sodium chloride pills once daily. Taking boost once daily to help with meals.  COPD- States his breathing is doing okay. Is working on quitting smoking- down to 5 cigarettes per day. Is not using albuterol inhaler frequently. Keeps it handy. Using albuterol 4 times per week average. Feels short of breath after he walks a long ways. Uses inhaler then. Cough is non-productive of sputum. Coughs daily.   Lives in trailer- has moved 2/2 bed bugs. Mother provides transportation. License was revoked for DUI in 2001.    Past Medical History:  Diagnosis Date  . Depression   . Emphysema lung ([place])   . Emphysema of lung ([place])   . Essential hypertension   . History of chicken pox   . Low weight   . Prostate disease   . Seasonal allergies   . Seizures ([place])     Current Outpatient Prescriptions on File Prior to Visit  Medication Sig  . albuterol (PROVENTIL HFA;VENTOLIN HFA) 108 (90 BASE) MCG/ACT inhaler Inhale into the lungs every 6 (six) hours as needed for wheezing or shortness of breath.  Marland Kitchen aspirin 81 MG tablet Take 1 tablet (81 mg total) by mouth daily.  . Blood Pressure Monitoring (BLOOD PRESSURE CUFF) MISC 1 each by Does not apply route daily.  Marland Kitchen buPROPion (WELLBUTRIN SR) 150 MG 12 hr tablet Take 1 tablet once daily for 1 week and then increase to 1 tablet twice daily.  . capsaicin (ZOSTRIX) 0.025 % cream Apply topically 2 (two) times daily.  . diphenhydrAMINE (BENADRYL) 25 MG tablet Take 1 tablet (25 mg total) by mouth every 6 (six) hours as  needed.  . loratadine (CLARITIN) 10 MG tablet Take 1 tablet (10 mg total) by mouth daily.  . metoprolol succinate (TOPROL-XL) 50 MG 24 hr tablet Take 1 tablet (50 mg total) by mouth daily.  . tamsulosin (FLOMAX) 0.4 MG CAPS capsule Take 1 capsule (0.4 mg total) by mouth daily after supper.   No current facility-administered medications on file prior to visit.     Review of Systems  Constitutional: Negative for chills and fever.  HENT: Negative.   Respiratory: Positive for cough and shortness of breath. Negative for chest tightness and wheezing.   Cardiovascular: Negative for chest pain, palpitations and leg swelling.  Gastrointestinal: Negative for abdominal pain, nausea and vomiting.  Endocrine: Negative.   Genitourinary: Negative for discharge, dysuria, penile pain, testicular pain and urgency.  Musculoskeletal: Negative for arthralgias, back pain and joint swelling.  Skin: Negative.   Neurological: Negative for dizziness, weakness, numbness and headaches.  Psychiatric/Behavioral: Negative for dysphoric mood and sleep disturbance.   Per HPI unless specifically indicated above     Objective:    BP (!) 160/106 (BP Location: Left Arm)   Pulse 79   Temp 97.5 F (36.4 C) (Oral)   Ht 5\' 8"  (1.727 m)   Wt 112 lb (50.8 kg)   BMI 17.03 kg/m   Wt Readings from Last 3 Encounters:  04/07/16 112 lb (50.8 kg)  02/13/16 114 lb 12.8 oz (52.1 kg)  01/06/16 116 lb (52.6 kg)    Physical Exam  Constitutional: He is oriented to person, place, and time. He appears well-developed and well-nourished. No distress.  HENT:  Head: Normocephalic and atraumatic.  Neck: Neck supple. No thyromegaly present.  Cardiovascular: Normal rate, regular rhythm and normal heart sounds.  Exam reveals no gallop and no friction rub.   No murmur heard. Pulmonary/Chest: Effort normal and breath sounds normal. He has no wheezes.  Abdominal: Soft. Bowel sounds are normal. He exhibits no distension. There is no  tenderness. There is no rebound.  Musculoskeletal: Normal range of motion. He exhibits no edema or tenderness.  Neurological: He is alert and oriented to person, place, and time. He has normal reflexes.  Skin: Skin is warm and dry. No rash noted. No erythema.  Scatter skin tears.   Psychiatric: He has a normal mood and affect. His behavior is normal. Thought content normal.   Results for orders placed or performed during the hospital encounter of 02/25/16  Surgical pathology  Result Value Ref Range   SURGICAL PATHOLOGY      Surgical Pathology CASE: [id number] PATIENT: Donald [name] Surgical Pathology Report     SPECIMEN SUBMITTED: A. Colon polyp, sigmoid; cold snare B. Colon polyp, transverse; cold snare C. Colon polyp, cecum; cold bx D. Colon polyp, sigmoid; cold bx  CLINICAL HISTORY: None provided  PRE-OPERATIVE DIAGNOSIS: Screening  Z12.11  POST-OPERATIVE DIAGNOSIS: Colon polyps, diverticulosis     DIAGNOSIS:  A. COLON POLYP, SIGMOID; COLD SNARE: - HYPERPLASTIC POLYP. - NEGATIVE FOR DYSPLASIA AND MALIGNANCY.  B. COLON POLYP, TRANSVERSE; COLD SNARE: - TUBULAR ADENOMA. - NEGATIVE FOR HIGH GRADE DYSPLASIA AND MALIGNANCY.  C. COLON POLYP, CECUM; COLD BIOPSY: - TUBULAR ADENOMA. - NEGATIVE FOR HIGH GRADE DYSPLASIA AND MALIGNANCY.  D. COLON POLYP, SIGMOID; COLD BIOPSY: - TUBULAR ADENOMA. - NEGATIVE FOR HIGH GRADE DYSPLASIA AND MALIGNANCY.  GROSS DESCRIPTION:  A. Labeled: C snare sigmoid colon polyp  Tissue fragment(s): 1  Size: 0.6 cm  Description: pink to  tan polypoid fragment, marked blue  Entirely submitted in 1 cassette(s).   B. Labeled: C snare transverse colon polyp  Tissue fragment(s): 1  Size: 0.3 cm  Description:  tan  Entirely submitted in 1 cassette(s).  C. Labeled: C BX cecal polyp  Tissue fragment(s): 1  Size: 0.1 cm  Description: tan  Entirely submitted in 1 cassette(s).  D. Labeled: C BX sigmoid colon polyp  Tissue  fragment(s): multiple  Size: aggregate, 2.5 x 0.6 x 0.1 cm  Description: tan fragments and fecal material  Entirely submitted in 1 cassette(s).    Final Diagnosis performed by Quay Burow, MD.  Electronically signed 02/26/2016 1:08:15PM    The electronic signature indicates that the named Attending Pathologist has evaluated the specimen  Technical component performed at [facility], [address] [place], [place] [address] Lab: [phone] Dir: Darrick Penna. Evette Doffing, MD  Professional component performed at [facility], [facility], [place],  [place], [place] [address] Lab: [phone] Dir: Dellia Nims. Reuel Derby, MD        Assessment & Plan:   Problem List Items Addressed This Visit      Cardiovascular and Mediastinum   Hypertension    Very elevated today. Will plan to restart amlodipine once daily. Recheck 2 weeks.       Relevant Medications   amLODipine (NORVASC) 5 MG tablet     Respiratory   COPD (chronic obstructive pulmonary disease) ([place]) - Primary  Appears at baseline today. Last Spirometry shows moderate to severe obstruction. Will start Advair twice daily. Repeat spirometry. Consider pulmonology referral with worsening COPD.       Relevant Medications   Fluticasone-Salmeterol (ADVAIR) 250-50 MCG/DOSE AEPB   Other Relevant Orders   CBC with Differential     Nervous and Auditory   Peripheral neuropathy (HCC)     Other   Smoker    Continue Wellbutrin for smoking cessation.       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 However pt reports heavy drinking today in the office. Will recheck renal function. Plan to [facility] nephrology on lab work. Continue sodium as prescribed by nephrology daily. Continue boost once daily.       Relevant Orders   COMPLETE METABOLIC PANEL WITH GFR   TSH   AA (alcohol abuse)    Pt requesting antiabuse. Have referred him to [facility] for outpatient substance abuse counseling. AA references  given as well.        Other Visit Diagnoses   None.     Meds ordered this encounter  Medications  . sodium chloride 1 g tablet    Sig: Take 1 g by mouth once.  . Fluticasone-Salmeterol (ADVAIR) 250-50 MCG/DOSE AEPB    Sig: Inhale 1 puff into the lungs 2 (two) times daily.    Dispense:  60 each    Refill:  11    Order Specific Question:   Supervising Provider    Answer:   Arlis Porta [phone]  . amLODipine (NORVASC) 5 MG tablet    Sig: Take 1 tablet (5 mg total) by mouth daily.    Dispense:  30 tablet    Refill:  11    Order Specific Question:   Supervising Provider    Answer:   Arlis Porta [phone]      Follow up plan: Return in about 2 weeks (around 04/21/2016) for Blood pressure and spirometry. Please bring inhaler. Marland Kitchen

## 2016-04-07 NOTE — Patient Instructions (Addendum)
Please start taking amlodipine today. Take 5 mg when you get home. Take once daily. We will check your blood pressure again in 2 weeks. Your goal blood pressure is 140/90. Work on low salt/sodium diet - goal <1.5gm (1,500mg ) per day. Eat a diet high in fruits/vegetables and whole grains.  Look into mediterranean and DASH diet. Goal activity is 175min/wk of moderate intensity exercise.  This can be split into 30 minute chunks.  If you are not at this level, you can start with smaller 10-15 min increments and slowly build up activity. Look at [facility] for more resources Please seek immediate medical attention at ER or Urgent Care if you develop: Chest pain, pressure or tightness. Shortness of breath accompanied by nausea or diaphoresis Visual changes Numbness or tingling on one side of the body Facial droop Altered mental status Or any concerning symptoms.  Please continue to drink boost and take sodium as directed by the kidney doctor. We will send your labs to Dr. Candiss Norse.  Also- please start taking Advair 1 puff twice daily to help with your breathing.  Bring your inhaler to next visit.   Please call [facility] at [phone] or [facility] at [phone] to discuss outpatient substance abuse counseling. You can also contact your local AA at: HOTLINE: [phone]

## 2016-04-07 NOTE — Assessment & Plan Note (Signed)
Continue Wellbutrin for smoking cessation.

## 2016-04-07 NOTE — Assessment & Plan Note (Signed)
Very elevated today. Will plan to restart amlodipine once daily. Recheck 2 weeks.

## 2016-04-08 LAB — COMPLETE METABOLIC PANEL WITH GFR
ALBUMIN: 4.4 g/dL (ref 3.6–5.1)
ALK PHOS: 150 U/L — AB (ref 40–115)
ALT: 19 U/L (ref 9–46)
AST: 25 U/L (ref 10–35)
BILIRUBIN TOTAL: 1.3 mg/dL — AB (ref 0.2–1.2)
BUN: 6 mg/dL — AB (ref 7–25)
CALCIUM: 10.2 mg/dL (ref 8.6–10.3)
CO2: 25 mmol/L (ref 20–31)
CREATININE: 0.8 mg/dL (ref 0.70–1.33)
Chloride: 93 mmol/L — ABNORMAL LOW (ref 98–110)
GFR, Est Non African American: >89 mL/min (ref >=60)
Glucose, Bld: 116 mg/dL — ABNORMAL HIGH (ref 65–99)
Potassium: 4.8 mmol/L (ref 3.5–5.3)
Sodium: 129 mmol/L — ABNORMAL LOW (ref 135–146)
Total Protein: 8.4 g/dL — ABNORMAL HIGH (ref 6.1–8.1)

## 2016-04-21 ENCOUNTER — Ambulatory Visit (INDEPENDENT_AMBULATORY_CARE_PROVIDER_SITE_OTHER): Payer: Medicaid Other | Admitting: Family Medicine

## 2016-04-21 VITALS — BP 123/89 | HR 81 | Temp 98.6°F | Resp 16 | Ht 68.0 in | Wt 114.6 lb

## 2016-04-21 DIAGNOSIS — F17200 Nicotine dependence, unspecified, uncomplicated: Secondary | ICD-10-CM

## 2016-04-21 DIAGNOSIS — Z720 Tobacco use: Secondary | ICD-10-CM

## 2016-04-21 DIAGNOSIS — I10 Essential (primary) hypertension: Secondary | ICD-10-CM

## 2016-04-21 DIAGNOSIS — E871 Hypo-osmolality and hyponatremia: Secondary | ICD-10-CM | POA: Diagnosis not present

## 2016-04-21 DIAGNOSIS — J449 Chronic obstructive pulmonary disease, unspecified: Secondary | ICD-10-CM

## 2016-04-21 LAB — BASIC METABOLIC PANEL
BUN: 6 mg/dL — AB (ref 7–25)
CALCIUM: 9.7 mg/dL (ref 8.6–10.3)
CO2: 25 mmol/L (ref 20–31)
CREATININE: 0.87 mg/dL (ref 0.70–1.33)
Chloride: 96 mmol/L — ABNORMAL LOW (ref 98–110)
GLUCOSE: 87 mg/dL (ref 65–99)
Potassium: 4.7 mmol/L (ref 3.5–5.3)
Sodium: 131 mmol/L — ABNORMAL LOW (ref 135–146)

## 2016-04-21 NOTE — Patient Instructions (Addendum)
We will get you over to a lung specialist to evaluate your breathing. Their office will call you.   Keep eating 3 regular meals and using boost. Take sodium pills as directed by your Nephrologist. Please keep your appt in September.

## 2016-04-21 NOTE — Assessment & Plan Note (Signed)
Pt down to 3 cigarettes per day. Commended pt on his efforts. Encouraged pt to set a quit date.

## 2016-04-21 NOTE — Progress Notes (Signed)
Subjective:    Patient ID: Donald Handy., male    DOB: 1961/04/22, 55 y.o.   MRN: [id number]  HPI: Donald Mears. is a 55 y.o. male presenting on 04/21/2016 for Hypertension   HPI  Pt presents for hypertension. Blood presure is doing well. Started back amlodipine once daily.  COPD: Did not start Advair due to his medicaid prior authorization needed. This is pending. Taking Wellbutrin to help with smoking cessation. Smokes 3 cigarettes per day. His quit date was supposed to be yesterday. Reports his breathing is doing okay. Gets out of breath with 75 yard walk. Uses albuterol inhaler when he starts coughing and gets short of breath. Does not use daily.  Hyponatremia: Last sodium 129. Taking sodium chloride pills. Taking boost to help with his sodium levels. Eating 3 meals per day. Sometimes 2. Usually meals including. Pt reports alcohol 6 pack of beer per week.   Past Medical History:  Diagnosis Date  . Depression   . Emphysema lung ([place])   . Emphysema of lung ([place])   . Essential hypertension   . History of chicken pox   . Low weight   . Prostate disease   . Seasonal allergies   . Seizures ([place])     Current Outpatient Prescriptions on File Prior to Visit  Medication Sig  . albuterol (PROVENTIL HFA;VENTOLIN HFA) 108 (90 BASE) MCG/ACT inhaler Inhale into the lungs every 6 (six) hours as needed for wheezing or shortness of breath.  Marland Kitchen amLODipine (NORVASC) 5 MG tablet Take 1 tablet (5 mg total) by mouth daily.  Marland Kitchen aspirin 81 MG tablet Take 1 tablet (81 mg total) by mouth daily.  . Blood Pressure Monitoring (BLOOD PRESSURE CUFF) MISC 1 each by Does not apply route daily.  Marland Kitchen buPROPion (WELLBUTRIN SR) 150 MG 12 hr tablet Take 1 tablet once daily for 1 week and then increase to 1 tablet twice daily.  . capsaicin (ZOSTRIX) 0.025 % cream Apply topically 2 (two) times daily.  . diphenhydrAMINE (BENADRYL) 25 MG tablet Take 1 tablet (25 mg total) by mouth every 6 (six) hours as needed.  .  Fluticasone-Salmeterol (ADVAIR) 250-50 MCG/DOSE AEPB Inhale 1 puff into the lungs 2 (two) times daily.  Marland Kitchen loratadine (CLARITIN) 10 MG tablet Take 1 tablet (10 mg total) by mouth daily.  . metoprolol succinate (TOPROL-XL) 50 MG 24 hr tablet Take 1 tablet (50 mg total) by mouth daily.  . sodium chloride 1 g tablet Take 1 g by mouth once.  . tamsulosin (FLOMAX) 0.4 MG CAPS capsule Take 1 capsule (0.4 mg total) by mouth daily after supper.   No current facility-administered medications on file prior to visit.     Review of Systems  Constitutional: Negative for chills and fever.  HENT: Negative.   Respiratory: Negative for chest tightness, shortness of breath and wheezing.   Cardiovascular: Negative for chest pain, palpitations and leg swelling.  Gastrointestinal: Negative for abdominal pain, nausea and vomiting.  Endocrine: Negative.   Genitourinary: Negative for discharge, dysuria, penile pain, testicular pain and urgency.  Musculoskeletal: Negative for arthralgias, back pain and joint swelling.  Skin: Negative.   Neurological: Negative for dizziness, weakness, numbness and headaches.  Psychiatric/Behavioral: Negative for dysphoric mood and sleep disturbance.   Per HPI unless specifically indicated above     Objective:    BP 123/89 (BP Location: Right Arm, Patient Position: Sitting, Cuff Size: Normal)   Pulse 81   Temp 98.6 F (37 C) (Oral)  Resp 16   Ht '5\' 8"'  (1.727 m)   Wt 114 lb 9.6 oz (52 kg)   SpO2 99%   BMI 17.42 kg/m   Wt Readings from Last 3 Encounters:  04/21/16 114 lb 9.6 oz (52 kg)  04/07/16 112 lb (50.8 kg)  02/13/16 114 lb 12.8 oz (52.1 kg)    Physical Exam  Constitutional: He is oriented to person, place, and time. He appears well-developed and well-nourished. No distress.  HENT:  Head: Normocephalic and atraumatic.  Neck: Neck supple. No thyromegaly present.  Cardiovascular: Normal rate, regular rhythm and normal heart sounds.  Exam reveals no gallop and no  friction rub.   No murmur heard. Pulmonary/Chest: Effort normal and breath sounds normal. He has no wheezes.  Abdominal: Soft. Bowel sounds are normal. He exhibits no distension. There is no tenderness. There is no rebound.  Musculoskeletal: Normal range of motion. He exhibits no edema or tenderness.  Neurological: He is alert and oriented to person, place, and time. He has normal reflexes.  Skin: Skin is warm and dry. No rash noted. No erythema.  Psychiatric: He has a normal mood and affect. His behavior is normal. Thought content normal.   Results for orders placed or performed in visit on 04/07/16  COMPLETE METABOLIC PANEL WITH GFR  Result Value Ref Range   Sodium 129 (L) 135 - 146 mmol/L   Potassium 4.8 3.5 - 5.3 mmol/L   Chloride 93 (L) 98 - 110 mmol/L   CO2 25 20 - 31 mmol/L   Glucose, Bld 116 (H) 65 - 99 mg/dL   BUN 6 (L) 7 - 25 mg/dL   Creat 0.80 0.70 - 1.33 mg/dL   Total Bilirubin 1.3 (H) 0.2 - 1.2 mg/dL   Alkaline Phosphatase 150 (H) 40 - 115 U/L   AST 25 10 - 35 U/L   ALT 19 9 - 46 U/L   Total Protein 8.4 (H) 6.1 - 8.1 g/dL   Albumin 4.4 3.6 - 5.1 g/dL   Calcium 10.2 8.6 - 10.3 mg/dL   GFR, Est African American >89 >=60 mL/min   GFR, Est Non African American >89 >=60 mL/min  TSH  Result Value Ref Range   TSH 2.71 0.40 - 4.50 mIU/L  CBC with Differential  Result Value Ref Range   WBC 7.2 3.8 - 10.8 K/uL   RBC 4.53 4.20 - 5.80 MIL/uL   Hemoglobin 15.9 13.2 - 17.1 g/dL   HCT 44.4 38.5 - 50.0 %   MCV 98.0 80.0 - 100.0 fL   MCH 35.1 (H) 27.0 - 33.0 pg   MCHC 35.8 32.0 - 36.0 g/dL   RDW 13.8 11.0 - 15.0 %   Platelets 194 140 - 400 K/uL   MPV 9.5 7.5 - 12.5 fL   Neutro Abs 5,184 1,500 - 7,800 cells/uL   Lymphs Abs 864 850 - 3,900 cells/uL   Monocytes Absolute 1,008 (H) 200 - 950 cells/uL   Eosinophils Absolute 144 15 - 500 cells/uL   Basophils Absolute 0 0 - 200 cells/uL   Neutrophils Relative % 72 %   Lymphocytes Relative 12 %   Monocytes Relative 14 %    Eosinophils Relative 2 %   Basophils Relative 0 %   Smear Review Criteria for review not met       Assessment & Plan:   Problem List Items Addressed This Visit      Cardiovascular and Mediastinum   Hypertension    Improved back on amlodipine daily. Continue current regimen.  Recheck in 2 mos.         Respiratory   COPD (chronic obstructive pulmonary disease) ([place]) - Primary    Unable to do spirometry today 2/2 machine malfunction. Last showed severe obstruction. 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       Relevant Orders   Ambulatory referral to Pulmonology     Other   Smoker    Pt down to 3 cigarettes per day. Commended pt on his efforts. Encouraged pt to set a quit date.      Hyponatremia    Currently being managed by nephrology. 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 Potentially caused by alcohol abuse as well.  Continue NaCl pills. Recheck BMP today.        Relevant Orders   Basic Metabolic Panel (BMET)    Other Visit Diagnoses   None.     No orders of the defined types were placed in this encounter.     Follow up plan: Return in about 2 months (around 06/21/2016) for Blood pressure. Marland Kitchen

## 2016-04-21 NOTE — Assessment & Plan Note (Addendum)
Currently being managed by nephrology. 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 Potentially caused by alcohol abuse as well.  Continue NaCl pills. Recheck BMP today.

## 2016-04-21 NOTE — Assessment & Plan Note (Signed)
Unable to do spirometry today 2/2 machine malfunction. Last showed severe obstruction. 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

## 2016-04-21 NOTE — Assessment & Plan Note (Signed)
Improved back on amlodipine daily. Continue current regimen. Recheck in 2 mos.

## 2016-04-28 ENCOUNTER — Ambulatory Visit: Payer: Medicaid Other | Admitting: Pulmonary Disease

## 2016-05-18 ENCOUNTER — Ambulatory Visit (INDEPENDENT_AMBULATORY_CARE_PROVIDER_SITE_OTHER): Payer: Medicaid Other | Admitting: Pulmonary Disease

## 2016-05-18 ENCOUNTER — Encounter: Payer: Self-pay | Admitting: Pulmonary Disease

## 2016-05-18 VITALS — BP 124/76 | HR 103 | Ht 68.0 in | Wt 114.8 lb

## 2016-05-18 DIAGNOSIS — I739 Peripheral vascular disease, unspecified: Secondary | ICD-10-CM

## 2016-05-18 DIAGNOSIS — Z720 Tobacco use: Secondary | ICD-10-CM | POA: Diagnosis not present

## 2016-05-18 DIAGNOSIS — F17200 Nicotine dependence, unspecified, uncomplicated: Secondary | ICD-10-CM

## 2016-05-18 DIAGNOSIS — J449 Chronic obstructive pulmonary disease, unspecified: Secondary | ICD-10-CM

## 2016-05-18 MED ORDER — VARENICLINE TARTRATE 0.5 MG X 11 & 1 MG X 42 PO MISC: ORAL | 0 refills | Status: DC

## 2016-05-18 MED ORDER — VARENICLINE TARTRATE 1 MG PO TABS: 1.0000 mg | ORAL_TABLET | Freq: Two times a day (BID) | ORAL | 5 refills | Status: DC

## 2016-05-18 NOTE — Progress Notes (Signed)
chanix

## 2016-05-20 NOTE — Progress Notes (Signed)
PULMONARY CONSULT NOTE  Requesting MD/Service: Fredia Sorrow, NP Date of initial consultation: 05/18/16 Reason for consultation: COPD, smoker  PT PROFILE: 83 M smoker referred for evaluation of COPD  HPI:  29 M referred by Fredia Sorrow, NP for evaluation of COPD. The pt seems uncertain why he is here. He does have a diagnosis of COPD rendered approx 3 yrs ago and severe PVD. He has moderate to severe exertional limitation which he attributes more to leg discomfort than to dyspnea. He has minimal cough and denies CP, fever, purulent sputum, hemoptysis, LE edema. HE was started on Advair inhaler approx 3 months ago with marginal benefit in SOB. He also reports that it causes mild throat dryness. He continues to smoke one PPD. There was some discussion about starting Chantix but he indicates that Medicaid won't pay for it. He had Spirometry performed 07/29/15 which revealed FEV1 of 2.24 liters (59% pred).   Past Medical History:  Diagnosis Date  . Depression   . Emphysema lung ([place])   . Emphysema of lung ([place])   . Essential hypertension   . History of chicken pox   . Low weight   . Prostate disease   . Seasonal allergies   . Seizures ([place])     Past Surgical History:  Procedure Laterality Date  . COLONOSCOPY WITH PROPOFOL N/A 02/25/2016   Procedure: COLONOSCOPY WITH PROPOFOL;  Surgeon: [name], MD;  Location: [facility] ENDOSCOPY;  Service: Endoscopy;  Laterality: N/A;  . EYE SURGERY  2015   Cataract  . none      MEDICATIONS: I have reviewed all medications and confirmed regimen as documented  Social History   Social History  . Marital status: Single    Spouse name: N/A  . Number of children: N/A  . Years of education: N/A   Occupational History  . Not on file.   Social History Main Topics  . Smoking status: Current Every Day Smoker    Packs/day: 0.50    Years: 35.00    Types: Cigarettes  . Smokeless tobacco: Never Used  . Alcohol use 0.0 oz/week     Comment: occ  . Drug use:  No  . Sexual activity: Not on file   Other Topics Concern  . Not on file   Social History Narrative  . No narrative on file    Family History  Problem Relation Age of Onset  . Ulcers Father     bleeding  . Cancer Mother     Breast    ROS: No fever, myalgias/arthralgias, unexplained weight loss or weight gain No new focal weakness or sensory deficits No otalgia, hearing loss, visual changes, nasal and sinus symptoms, mouth and throat problems No neck pain or adenopathy No abdominal pain, N/V/D, diarrhea, change in bowel pattern No dysuria, change in urinary pattern   Vitals:   05/18/16 1041  BP: 124/76  Pulse: (!) 103  SpO2: 98%  Weight: 114 lb 12.8 oz (52.1 kg)  Height: 5\' 8"  (1.727 m)     EXAM:  Gen: Appears older than true age, No overt respiratory distress HEENT: NCAT, sclera white, oropharynx normal Neck: Supple without LAN, thyromegaly, JVD Lungs: breath sounds: mildly diminshed, percussion: normal, No wheezes Cardiovascular: Reg, no murmurs noted Abdomen: Soft, nontender, normal BS Ext: without clubbing, cyanosis, edema Neuro: CNs grossly intact, motor and sensory intact Skin: Limited exam, no lesions noted  DATA:  Spirometry 07/29/15: moderate obstruction (FEV! 2.24, 59%)  BMP Latest Ref Rng & Units 04/21/2016 04/07/2016 02/13/2016  Glucose 65 - 99 mg/dL 87 116(H) 89  BUN 7 - 25 mg/dL 6(L) 6(L) 6(L)  Creatinine 0.70 - 1.33 mg/dL 0.87 0.80 0.79  BUN/Creat Ratio 9 - 20 - - -  Sodium 135 - 146 mmol/L 131(L) 129(L) 128(L)  Potassium 3.5 - 5.3 mmol/L 4.7 4.8 4.9  Chloride 98 - 110 mmol/L 96(L) 93(L) 90(L)  CO2 20 - 31 mmol/L 25 25 23   Calcium 8.6 - 10.3 mg/dL 9.7 10.2 9.8    CBC Latest Ref Rng & Units 04/07/2016 07/31/2015 11/20/2011  WBC 3.8 - 10.8 K/uL 7.2 5.9 4.6  Hemoglobin 13.2 - 17.1 g/dL 15.9 - 12.8(L)  Hematocrit 38.5 - 50.0 % 44.4 42.5 37.8(L)  Platelets 140 - 400 K/uL 194 161 201    CXR:  No recent  IMPRESSION:     ICD-9-CM ICD-10-CM   1.  Chronic obstructive pulmonary disease, unspecified COPD type ([place]) 496 J44.9 DG Chest 2 View  2. Smoker 305.1 Z72.0 DG Chest 2 View  3. PVD (peripheral vascular disease) (HCC) 443.9 I73.9    Most important issue is smoking, especially in light of moderate COPD and severe PVD at a very young age. I counseled him in detail re: the imperative of smoking cessation and strategies. I am not aware that Chantix is not covered by Medicaid and question the veracity of this contention as I believe that I have had other Medicaid pts on it before.   PLAN:  - Cont Advair - Counseled in detail re: smoking cessation - Chantix prescribed - we will do whatever is necessary to get this medication approved or to get him assistance so that he can afford it - ROV 4 weeks   Merton Border, MD PCCM service Mobile [phone] Pager [phone] 05/20/2016

## 2016-06-15 ENCOUNTER — Ambulatory Visit
Admission: RE | Admit: 2016-06-15 | Discharge: 2016-06-15 | Disposition: A | Discharge status: Home / Self Care | Payer: Medicaid Other | Source: Ambulatory Visit | Attending: Pulmonary Disease | Admitting: Pulmonary Disease

## 2016-06-15 ENCOUNTER — Encounter: Payer: Self-pay | Admitting: Pulmonary Disease

## 2016-06-15 ENCOUNTER — Ambulatory Visit (INDEPENDENT_AMBULATORY_CARE_PROVIDER_SITE_OTHER): Payer: Medicaid Other | Admitting: Pulmonary Disease

## 2016-06-15 VITALS — BP 124/76 | HR 86 | Ht 68.0 in | Wt 117.6 lb

## 2016-06-15 DIAGNOSIS — J449 Chronic obstructive pulmonary disease, unspecified: Secondary | ICD-10-CM | POA: Insufficient documentation

## 2016-06-15 DIAGNOSIS — F17200 Nicotine dependence, unspecified, uncomplicated: Secondary | ICD-10-CM

## 2016-06-15 DIAGNOSIS — J439 Emphysema, unspecified: Secondary | ICD-10-CM | POA: Diagnosis not present

## 2016-06-15 NOTE — Progress Notes (Signed)
PROBLEMS: COPD - emphysema and chronic bronchitis Multiple pulmonary nodules - benign Severe PVD Smoker  DATA: CT chest 02/15/14: Severe emphysema, RUL scarring, scattered bilateral small nodules Office spirometry 07/29/15: Moderate obstruction (FEV1 2.24 liters, 59% pred) CXR 06/15/16: Hyperinflation, few bilateral very small nodules  INTERVAL HISTORY: No major events  SUBJ: Taking Chantix. Still smoking 5-10 cigs/day. C/O mild nausea. Otherwise no new complaints. Denies CP, fever, purulent sputum, hemoptysis, LE edema and calf tenderness  OBJ: Vitals:   06/15/16 1046  BP: 124/76  Pulse: 86  SpO2: 99%  Weight: 117 lb 9.6 oz (53.3 kg)  Height: 5\' 8"  (1.727 m)   Appears older than true age Flat affect Smells of cigarettes HEENT: poor dentition, otherwise WNL No JVD or LAN noted Hyperresonant to percussion, diminished BS throughout, no wheezes Reg, no M Abd soft, NT, + BS Diffuse punctate lesions on B arms - appearance of bug bites No LE edema  DATA: CXR: as above   IMPRESSION: 1) COPD - moderate obstruction by most recent PFTs 2) Severe emphysema by CT chest 3) Multiple pulmonary nodules - no evidence of progression over 2 years. Deemed benign 4) recalcitrant smoker   PLAN: 1) again discussed smoking cessation in detail emphasizing the damage that cigarettes have already caused 2) Continue Chantix 3) Continue Advair and PRN albuterol 4) ROV 3 months   Merton Border, MD PCCM service Mobile [phone] Pager [phone] 06/15/2016

## 2016-06-15 NOTE — Patient Instructions (Signed)
You really must quit smoking Continue Chantix Continue Advair Follow up in 3 months

## 2016-06-23 ENCOUNTER — Ambulatory Visit (INDEPENDENT_AMBULATORY_CARE_PROVIDER_SITE_OTHER): Payer: Medicaid Other | Admitting: Family Medicine

## 2016-06-23 ENCOUNTER — Encounter: Payer: Self-pay | Admitting: Family Medicine

## 2016-06-23 VITALS — BP 131/86 | HR 98 | Temp 97.8°F | Resp 16 | Ht 68.0 in | Wt 117.0 lb

## 2016-06-23 DIAGNOSIS — I10 Essential (primary) hypertension: Secondary | ICD-10-CM | POA: Diagnosis not present

## 2016-06-23 DIAGNOSIS — E871 Hypo-osmolality and hyponatremia: Secondary | ICD-10-CM | POA: Diagnosis not present

## 2016-06-23 DIAGNOSIS — I739 Peripheral vascular disease, unspecified: Secondary | ICD-10-CM

## 2016-06-23 DIAGNOSIS — J449 Chronic obstructive pulmonary disease, unspecified: Secondary | ICD-10-CM

## 2016-06-23 DIAGNOSIS — F17200 Nicotine dependence, unspecified, uncomplicated: Secondary | ICD-10-CM

## 2016-06-23 NOTE — Assessment & Plan Note (Signed)
Followed by nephrology.Thought to be [facility] 2/2 hyponatremia. Continue sodium chloride tablets. Encouraged pt to continue boost and ensure to help maintain electrolyte levels. Avoid excessive alcohol.

## 2016-06-23 NOTE — Assessment & Plan Note (Signed)
Followed by Pulmonology- Continue current inhalers. Reviewed alarm symptoms.

## 2016-06-23 NOTE — Progress Notes (Signed)
Subjective:    Patient ID: Donald Handy., male    DOB: [birth year], 55 y.o.   MRN: [id number]  HPI: Donald Kightlinger. is a 55 y.o. male presenting on 06/23/2016 for Hypertension   HPI  Pt presents for follow-up of hypertension. BP doing well at home. Taking amlodipine and metoprolol at home. No chest pains or visual changes.  Seeing Dr. Alva Garnet for severe COPD- continues Advair daily and albuterol PRN. Uses albuterol about 2-3 times per week. Is taking chantix to help with smoking cessation. Smoking 5 cigarettes per day. Trying to wean off. Urge to smoke has reduced. Recent follow-up with Nephrology for hyponatremia- labs are stable. Taking sodium chloride pills a few times per week. Taking boost to help increase sodium as well. Still drinking alcohol- 6 beers per week.  Got his flu shot last week.   Past Medical History:  Diagnosis Date  . Depression   . Emphysema lung ([place])   . Emphysema of lung ([place])   . Essential hypertension   . History of chicken pox   . Low weight   . Prostate disease   . Seasonal allergies   . Seizures ([place])     Current Outpatient Prescriptions on File Prior to Visit  Medication Sig  . albuterol (PROVENTIL HFA;VENTOLIN HFA) 108 (90 BASE) MCG/ACT inhaler Inhale into the lungs every 6 (six) hours as needed for wheezing or shortness of breath.  Marland Kitchen amLODipine (NORVASC) 5 MG tablet Take 1 tablet (5 mg total) by mouth daily.  Marland Kitchen aspirin 81 MG tablet Take 1 tablet (81 mg total) by mouth daily.  . capsaicin (ZOSTRIX) 0.025 % cream Apply topically 2 (two) times daily.  . diphenhydrAMINE (BENADRYL) 25 MG tablet Take 1 tablet (25 mg total) by mouth every 6 (six) hours as needed.  . ENSURE (ENSURE) Take 237 mLs by mouth.  . Fluticasone-Salmeterol (ADVAIR) 250-50 MCG/DOSE AEPB Inhale 1 puff into the lungs 2 (two) times daily.  Marland Kitchen loratadine (CLARITIN) 10 MG tablet Take 1 tablet (10 mg total) by mouth daily.  . metoprolol succinate (TOPROL-XL) 50 MG 24 hr tablet Take  1 tablet (50 mg total) by mouth daily.  . sodium chloride 1 g tablet Take 1 g by mouth once.  . tamsulosin (FLOMAX) 0.4 MG CAPS capsule Take 1 capsule (0.4 mg total) by mouth daily after supper.  . varenicline (CHANTIX PAK) 0.5 MG X 11 & 1 MG X 42 tablet Take one 0.5 mg tablet by mouth once daily for 3 days, then increase to one 0.5 mg tablet twice daily for 4 days, then increase to one 1 mg tablet twice daily.  . varenicline (CHANTIX) 1 MG tablet Take 1 tablet (1 mg total) by mouth 2 (two) times daily.   No current facility-administered medications on file prior to visit.     Review of Systems  Constitutional: Negative for chills and fever.  HENT: Negative.   Respiratory: Negative for chest tightness, shortness of breath and wheezing.   Cardiovascular: Negative for chest pain, palpitations and leg swelling.  Gastrointestinal: Negative for abdominal pain, nausea and vomiting.  Endocrine: Negative.   Genitourinary: Negative for discharge, dysuria, penile pain, testicular pain and urgency.  Musculoskeletal: Negative for arthralgias, back pain and joint swelling.  Skin: Negative.   Neurological: Negative for dizziness, weakness, numbness and headaches.  Psychiatric/Behavioral: Negative for dysphoric mood and sleep disturbance.   Per HPI unless specifically indicated above     Objective:    BP 131/86  Pulse 98   Temp 97.8 F (36.6 C) (Oral)   Resp 16   Ht 5\' 8"  (1.727 m)   Wt 117 lb (53.1 kg)   BMI 17.79 kg/m   Wt Readings from Last 3 Encounters:  06/23/16 117 lb (53.1 kg)  06/15/16 117 lb 9.6 oz (53.3 kg)  05/18/16 114 lb 12.8 oz (52.1 kg)    Physical Exam  Constitutional: He is oriented to person, place, and time. He appears well-developed and well-nourished. No distress.  HENT:  Head: Normocephalic and atraumatic.  Neck: Neck supple. No thyromegaly present.  Cardiovascular: Normal rate, regular rhythm and normal heart sounds.  Exam reveals no gallop and no friction rub.    No murmur heard. Pulmonary/Chest: Effort normal and breath sounds normal. He has no wheezes.  Abdominal: Soft. Bowel sounds are normal. He exhibits no distension. There is no tenderness. There is no rebound.  Musculoskeletal: Normal range of motion. He exhibits no edema or tenderness.  Neurological: He is alert and oriented to person, place, and time. He has normal reflexes.  Skin: Skin is warm and dry. No rash noted. No erythema.  Psychiatric: He has a normal mood and affect. His behavior is normal. Thought content normal.   Results for orders placed or performed in visit on 123456  Basic Metabolic Panel (BMET)  Result Value Ref Range   Sodium 131 (L) 135 - 146 mmol/L   Potassium 4.7 3.5 - 5.3 mmol/L   Chloride 96 (L) 98 - 110 mmol/L   CO2 25 20 - 31 mmol/L   Glucose, Bld 87 65 - 99 mg/dL   BUN 6 (L) 7 - 25 mg/dL   Creat 0.87 0.70 - 1.33 mg/dL   Calcium 9.7 8.6 - 10.3 mg/dL      Assessment & Plan:   Problem List Items Addressed This Visit      Cardiovascular and Mediastinum   Hypertension    Controlled. Continue current regimen. Regular diet 2/2 low sodium levels. Recheck in 3 mos.       PAD (peripheral artery disease) (HCC)    Continue aspirin for secondary prevention. Last LDL was 35. Plan to recheck at next visit to determine if statin for secondary prevention would be helpful. Alarm symptoms reviewed. Recheck in 3 mos.         Respiratory   COPD (chronic obstructive pulmonary disease) ([place])    Followed by Pulmonology- Continue current inhalers. Reviewed alarm symptoms.       Relevant Medications   varenicline (CHANTIX) 1 MG tablet     Other   Smoker    Currently taking Chantix to help with smoking cessation. Continued to encourage patient in his efforts. Encouraged him to set goals for decreasing cigarettes per day. Reviewed health risks posed by continued smoking.       Hyponatremia - Primary    Followed by nephrology.Thought to be [facility] 2/2 hyponatremia.  Continue sodium chloride tablets. Encouraged pt to continue boost and ensure to help maintain electrolyte levels. Avoid excessive alcohol.        Other Visit Diagnoses   None.     Meds ordered this encounter  Medications  . varenicline (CHANTIX) 1 MG tablet    Sig: Take 1 mg by mouth 2 (two) times daily.      Follow up plan: Return in about 3 months (around 09/23/2016), or if symptoms worsen or fail to improve.

## 2016-06-23 NOTE — Assessment & Plan Note (Signed)
Continue aspirin for secondary prevention. Last LDL was 35. Plan to recheck at next visit to determine if statin for secondary prevention would be helpful. Alarm symptoms reviewed. Recheck in 3 mos.

## 2016-06-23 NOTE — Assessment & Plan Note (Signed)
Controlled. Continue current regimen. Regular diet 2/2 low sodium levels. Recheck in 3 mos.

## 2016-06-23 NOTE — Patient Instructions (Signed)
Your goal blood pressure is 140/90 Work on low salt/sodium diet - goal <1.5gm (1,500mg ) per day. Eat a diet high in fruits/vegetables and whole grains.  Look into mediterranean and DASH diet. Goal activity is 160min/wk of moderate intensity exercise.  This can be split into 30 minute chunks.  If you are not at this level, you can start with smaller 10-15 min increments and slowly build up activity. Look at [email or web address] for more resources  Please seek immediate medical attention at ER or Urgent Care if you develop: Chest pain, pressure or tightness. Shortness of breath accompanied by nausea or diaphoresis Visual changes Numbness or tingling on one side of the body Facial droop Altered mental status Or any concerning symptoms.  Please come fasting for next visit so we can do lab work.

## 2016-06-23 NOTE — Assessment & Plan Note (Signed)
Currently taking Chantix to help with smoking cessation. Continued to encourage patient in his efforts. Encouraged him to set goals for decreasing cigarettes per day. Reviewed health risks posed by continued smoking.

## 2016-09-21 ENCOUNTER — Telehealth: Payer: Self-pay | Admitting: *Deleted

## 2016-09-21 NOTE — Telephone Encounter (Signed)
Called pt in regards to a letter that was returned in regards to scheduling his f/u appt with DS. Pt states we have the correct address on file but we aren't sure why we received it back. appt scheduled. Nothing further needed.

## 2016-09-24 ENCOUNTER — Ambulatory Visit: Payer: Medicaid Other | Admitting: Family Medicine

## 2016-09-29 ENCOUNTER — Encounter: Payer: Self-pay | Admitting: Family Medicine

## 2016-09-29 ENCOUNTER — Ambulatory Visit (INDEPENDENT_AMBULATORY_CARE_PROVIDER_SITE_OTHER): Payer: Medicaid Other | Admitting: Family Medicine

## 2016-09-29 VITALS — BP 126/84 | HR 95 | Temp 97.9°F | Resp 16 | Ht 68.0 in | Wt 126.0 lb

## 2016-09-29 DIAGNOSIS — Z87891 Personal history of nicotine dependence: Secondary | ICD-10-CM

## 2016-09-29 DIAGNOSIS — I10 Essential (primary) hypertension: Secondary | ICD-10-CM

## 2016-09-29 DIAGNOSIS — J432 Centrilobular emphysema: Secondary | ICD-10-CM

## 2016-09-29 DIAGNOSIS — Z1159 Encounter for screening for other viral diseases: Secondary | ICD-10-CM

## 2016-09-29 DIAGNOSIS — Z114 Encounter for screening for human immunodeficiency virus [HIV]: Secondary | ICD-10-CM

## 2016-09-29 DIAGNOSIS — N138 Other obstructive and reflux uropathy: Secondary | ICD-10-CM

## 2016-09-29 DIAGNOSIS — N401 Enlarged prostate with lower urinary tract symptoms: Secondary | ICD-10-CM | POA: Diagnosis not present

## 2016-09-29 DIAGNOSIS — R7309 Other abnormal glucose: Secondary | ICD-10-CM

## 2016-09-29 DIAGNOSIS — Z125 Encounter for screening for malignant neoplasm of prostate: Secondary | ICD-10-CM | POA: Diagnosis not present

## 2016-09-29 MED ORDER — AMLODIPINE BESYLATE 5 MG PO TABS: 5.0000 mg | ORAL_TABLET | Freq: Every day | ORAL | 11 refills | Status: DC

## 2016-09-29 MED ORDER — METOPROLOL SUCCINATE ER 50 MG PO TB24: 50.0000 mg | ORAL_TABLET | Freq: Every day | ORAL | 11 refills | Status: DC

## 2016-09-29 MED ORDER — TAMSULOSIN HCL 0.4 MG PO CAPS: 0.4000 mg | ORAL_CAPSULE | Freq: Every day | ORAL | 11 refills | Status: DC

## 2016-09-29 NOTE — Assessment & Plan Note (Signed)
Stable, continue current regimen Follow-up with Pulm as planned Counseling to remain smoke free, continue Chantix for now

## 2016-09-29 NOTE — Assessment & Plan Note (Addendum)
Stable, only mild LUTS with minimal obstructive symptoms, BPH AUA score 5 mild - Continue Flomax 0.4mg  - Check PSA - Follow-up, consider finasteride in future if needed, declined DRE today discuss again in future

## 2016-09-29 NOTE — Progress Notes (Signed)
Subjective:    Patient ID: Donald [name]., male    DOB: [birth year], 56 y.o.   MRN: [id number]  Donald Zukoski. is a 56 y.o. male presenting on 09/29/2016 for Hypertension (3 month follow up)   HPI   CHRONIC HTN: Reports no concerns. Request refills today Current Meds - Metoprolol-XL 50mg  daily, Amlodipine 5mg    Reports good compliance, took meds today. Tolerating well, w/o complaints. Lifestyle - limited regular exercise Denies CP, dyspnea, HA, edema, dizziness / lightheadedness  BPH with history of LUTS and mild obstruction, improved - Chronic history with BPH, has not followed by Urology, denies any prior DRE, has not had PSA either. No known family history of prostate CA. - Reports symptoms stable and mostly controlled on Flomax 0.4mg  daily, never on finasteride, see AUA scoring below  Abnormal Glucose in past - Last A1c 5.3 (10/2015), due for repeat A1c  HYPONATREMIA, former history of alcohol use - Followed by Nephrology Dr Candiss Norse, Next visit tomorrow 1/24 - Previously taking sodium tabs, lasix, however he has weaned off these, and now only increased sodium in diet and taking occasional boost - Quit alcohol and smoking day after Christmas, remains alcohol free  COPD - Followed by Pulmonology Dr Alva Garnet, next visit in 2 months - Continues on Chantix for smoking cessation, now quit 1 month, doing well. Continues Advair and albuterol  PMH Former smoker h/o TOBACCO ABUSE, H/o PAD s/p stents in legs   Social History  Substance Use Topics  . Smoking status: Former Smoker    Packs/day: 0.50    Years: 35.00    Types: Cigarettes  . Smokeless tobacco: Former Systems developer    Quit date: 08/31/2016  . Alcohol use 0.0 oz/week     Comment: occ    AUA BPH Symptom Score over past 1 month 1. Sensation of not emptying bladder post void - 0 2. Urinate less than 2 hour after finish last void - 0 3. Start/Stop several times during void - 3 4. Difficult to postpone urination - 0 5. Weak  urinary stream - 0 6. Push or strain urination - 0 7. Nocturia - 2 times  Total Score: 5 (Mild BPH symptoms)  Review of Systems Per HPI unless specifically indicated above     Objective:    BP 126/84 (BP Location: Left Arm, Cuff Size: Normal)   Pulse 95   Temp 97.9 F (36.6 C) (Oral)   Resp 16   Ht 5\' 8"  (1.727 m)   Wt 126 lb (57.2 kg)   BMI 19.16 kg/m   Wt Readings from Last 3 Encounters:  09/29/16 126 lb (57.2 kg)  06/23/16 117 lb (53.1 kg)  06/15/16 117 lb 9.6 oz (53.3 kg)    Physical Exam  Constitutional: He is oriented to person, place, and time. He appears well-developed and well-nourished. No distress.  Appears older than stated age, well-appearing, comfortable, cooperative  HENT:  Head: Normocephalic and atraumatic.  Mouth/Throat: Oropharynx is clear and moist.  Nares patent without purulence or edema. Oropharynx clear without erythema, exudates, edema or asymmetry.  Eyes: Conjunctivae are normal.  Neck: Normal range of motion. Neck supple. No thyromegaly present.  Cardiovascular: Normal rate, regular rhythm, normal heart sounds and intact distal pulses.   No murmur heard. Pulmonary/Chest: Effort normal. No respiratory distress. He has no wheezes.  Mildly reduced air movement diffusely at baseline but overall pretty good. Some mild coarse sounds bilateral lower lungs, improve with cough. Speaks full sentences.  Genitourinary:  Genitourinary  Comments: Deferred DRE today  Musculoskeletal: Normal range of motion. He exhibits no edema.  Lymphadenopathy:    He has no cervical adenopathy.  Neurological: He is alert and oriented to person, place, and time.  Skin: Skin is warm and dry. No rash noted. He is not diaphoretic. No erythema.  Psychiatric: His behavior is normal.  Nursing note and vitals reviewed.  Results for orders placed or performed in visit on 123456  Basic Metabolic Panel (BMET)  Result Value Ref Range   Sodium 131 (L) 135 - 146 mmol/L    Potassium 4.7 3.5 - 5.3 mmol/L   Chloride 96 (L) 98 - 110 mmol/L   CO2 25 20 - 31 mmol/L   Glucose, Bld 87 65 - 99 mg/dL   BUN 6 (L) 7 - 25 mg/dL   Creat 0.87 0.70 - 1.33 mg/dL   Calcium 9.7 8.6 - 10.3 mg/dL      Assessment & Plan:   Problem List Items Addressed This Visit    Hypertension - Primary    Stable HTN, controlled on current regimen Refill meds, cont Metoprolol-XL 50mg , Amlodipine 5mg  - Check labs chemistry - Remain smoke free, cessation counseling - Follow-up      Relevant Medications   metoprolol succinate (TOPROL-XL) 50 MG 24 hr tablet   amLODipine (NORVASC) 5 MG tablet   Other Relevant Orders   COMPLETE METABOLIC PANEL WITH GFR   Lipid panel   Former smoker    Congratulated smoke free for 1 month, continue Chantix for now, encouraged to continue up to max 3 months, follow-up per Pulm      COPD (chronic obstructive pulmonary disease) (HCC)    Stable, continue current regimen Follow-up with Pulm as planned Counseling to remain smoke free, continue Chantix for now      Benign prostatic hyperplasia    Stable, only mild LUTS with minimal obstructive symptoms, BPH AUA score 5 mild - Continue Flomax 0.4mg  - Check PSA - Follow-up, consider finasteride in future if needed, declined DRE today discuss again in future      Relevant Medications   tamsulosin (FLOMAX) 0.4 MG CAPS capsule   Other Relevant Orders   PSA    Other Visit Diagnoses    Abnormal glucose       Check A1c, prior abnormal glucose in past, last A1c 5.3 >1 year ago   Relevant Orders   Hemoglobin A1c   Screening for HIV (human immunodeficiency virus)       Relevant Orders   HIV antibody   Need for hepatitis C screening test       Relevant Orders   Hepatitis C antibody   Prostate cancer screening       Check PSA, with known BPH, no prior DRE declined discuss at next visit   Relevant Orders   PSA      Meds ordered this encounter  Medications  . tamsulosin (FLOMAX) 0.4 MG CAPS capsule      Sig: Take 1 capsule (0.4 mg total) by mouth daily after supper.    Dispense:  30 capsule    Refill:  11  . metoprolol succinate (TOPROL-XL) 50 MG 24 hr tablet    Sig: Take 1 tablet (50 mg total) by mouth daily.    Dispense:  30 tablet    Refill:  11  . amLODipine (NORVASC) 5 MG tablet    Sig: Take 1 tablet (5 mg total) by mouth daily.    Dispense:  30 tablet    Refill:  11      Follow up plan: Return in about 3 months (around 12/28/2016) for blood pressure, BPH.  Nobie Putnam, [facility] 09/29/2016, 10:35 PM

## 2016-09-29 NOTE — Assessment & Plan Note (Signed)
Congratulated smoke free for 1 month, continue Chantix for now, encouraged to continue up to max 3 months, follow-up per [facility]

## 2016-09-29 NOTE — Patient Instructions (Signed)
Thank you for coming in to clinic today.  1. Blood pressure was initially mild elevated, improved on re-check - Recommend check BP outside office at drug store occasionally, write down numbers, if persistently elevated >140/90 then notify office - Otherwise continue current BP regimen, refilled meds - Try to work on regular light walking as you are for exercise  2. Congratulations on remaining smoke free and alcohol free - Continue Chantix for now, check with Dr Alva Garnet before stopping this medication  3. Low Sodium - Check blood work today - Please discuss with Dr Candiss Norse that you are off Sodium tablets, and off Lasix, to determine plan going forward - Notify them that they can receive blood work results from [place] if needed  4. Refilled prostate medication - Checked PSA blood test, prostate cancer screening, if abnormal then we will discuss possible referral to Urologist for other testing and may need biopsy if abnormal - If difficulty peeing worsening in future, can discuss additional medication for prostate  Please schedule a follow-up appointment with Dr. Parks Ranger in 3 months for follow-up HTN, Hyponatremia, BPH  If you have any other questions or concerns, please feel free to call the clinic or send a message through [place]. You may also schedule an earlier appointment if necessary.  Nobie Putnam, DO [place]

## 2016-09-29 NOTE — Assessment & Plan Note (Addendum)
Stable HTN, controlled on current regimen Refill meds, cont Metoprolol-XL 50mg , Amlodipine 5mg  - Check labs chemistry - Remain smoke free, cessation counseling - Follow-up

## 2016-09-30 ENCOUNTER — Ambulatory Visit: Payer: Medicaid Other | Admitting: Family Medicine

## 2016-09-30 LAB — COMPLETE METABOLIC PANEL WITH GFR
ALBUMIN: 4.2 g/dL (ref 3.6–5.1)
ALK PHOS: 88 U/L (ref 40–115)
ALT: 10 U/L (ref 9–46)
AST: 20 U/L (ref 10–35)
BILIRUBIN TOTAL: 1.4 mg/dL — AB (ref 0.2–1.2)
BUN: 5 mg/dL — AB (ref 7–25)
CALCIUM: 9.6 mg/dL (ref 8.6–10.3)
CHLORIDE: 95 mmol/L — AB (ref 98–110)
CO2: 23 mmol/L (ref 20–31)
CREATININE: 0.76 mg/dL (ref 0.70–1.33)
GFR, Est Non African American: >89 mL/min (ref >=60)
Glucose, Bld: 88 mg/dL (ref 65–99)
Potassium: 4 mmol/L (ref 3.5–5.3)
Sodium: 134 mmol/L — ABNORMAL LOW (ref 135–146)
TOTAL PROTEIN: 7.7 g/dL (ref 6.1–8.1)

## 2016-09-30 LAB — LIPID PANEL
Cholesterol: 140 mg/dL (ref <200)
HDL: 79 mg/dL (ref >40)
LDL CALC: 48 mg/dL (ref <100)
Total CHOL/HDL Ratio: 1.8 Ratio (ref <5.0)
Triglycerides: 67 mg/dL (ref <150)
VLDL: 13 mg/dL (ref <30)

## 2016-09-30 LAB — HEMOGLOBIN A1C
HEMOGLOBIN A1C: 4.8 % (ref <5.7)
MEAN PLASMA GLUCOSE: 91 mg/dL

## 2016-09-30 LAB — HEPATITIS C ANTIBODY: HCV AB: REACTIVE — AB

## 2016-09-30 LAB — HIV ANTIBODY (ROUTINE TESTING W REFLEX): HIV: NONREACTIVE

## 2016-09-30 LAB — PSA: PSA: 1.1 ng/mL (ref <=4.0)

## 2016-10-04 LAB — HEPATITIS C RNA QUANTITATIVE
HCV Quantitative Log: <1.18 Log IU/mL (ref <1.18)
HCV Quantitative: <15 IU/mL (ref <15)

## 2016-10-05 NOTE — Addendum Note (Signed)
Addended by: Olin Hauser on: 10/05/2016 12:23 PM   Modules accepted: Orders

## 2016-11-06 ENCOUNTER — Ambulatory Visit (INDEPENDENT_AMBULATORY_CARE_PROVIDER_SITE_OTHER): Payer: Medicaid Other | Admitting: Pulmonary Disease

## 2016-11-06 ENCOUNTER — Encounter: Payer: Self-pay | Admitting: Pulmonary Disease

## 2016-11-06 VITALS — BP 128/78 | HR 99 | Wt 129.0 lb

## 2016-11-06 DIAGNOSIS — Z87891 Personal history of nicotine dependence: Secondary | ICD-10-CM

## 2016-11-06 DIAGNOSIS — J439 Emphysema, unspecified: Secondary | ICD-10-CM | POA: Diagnosis not present

## 2016-11-06 DIAGNOSIS — J449 Chronic obstructive pulmonary disease, unspecified: Secondary | ICD-10-CM

## 2016-11-06 NOTE — Patient Instructions (Signed)
Congratulations on your success in quitting smoking. Keep up the good work  Decrease Chantix to 1 pill once a day for 7 days, then stop  Continue Advair and albuterol inhaler as needed  Lung cancer screening scan has been ordered  Follow-up in 6 months or as needed

## 2016-11-06 NOTE — Progress Notes (Signed)
PROBLEMS: COPD - emphysema and chronic bronchitis Multiple pulmonary nodules - benign Severe PVD Smoker - quit 08/2016  DATA: 02/15/14 CT chest: Severe emphysema, RUL scarring, scattered bilateral small nodules 07/29/15 spirometry: Moderate obstruction (FEV1 2.24 liters, 59% pred) 06/15/16 CXR: Hyperinflation, few bilateral very small nodules  INTERVAL HISTORY: No major events  SUBJ: This is a routine reevaluation. He has no new complaints. He quit smoking around Christmas 2017. He continues to take Chantix and is asking for guidance as to when and how to stop this medication. He continues to have class II dyspnea.Denies CP, fever, purulent sputum, hemoptysis, LE edema and calf tenderness   OBJ: Vitals:   11/06/16 1018  BP: 128/78  Pulse: 99  SpO2: 95%  Weight: 129 lb (58.5 kg)   Appears older than true age 56: poor dentition, otherwise WNL No JVD or LAN noted Hyperresonant to percussion, diminished BS, no wheezes Reg, no M Abd soft, NT, + BS No LE edema  DATA:  IMPRESSION: Former smoker - still at risk for relapse Pulmonary emphysema, unspecified emphysema type  Moderate COPD   PLAN: -I congratulated on his success quitting smoking. We discussed the importance of total abstinence to prevent relapse. -I recommended that he decrease the Chantix to 1 pill once a day for 7 days then stop -He is to continue Advair and PRN albuterol -I have ordered low dose CT scanning of chest for lung cancer screening -Follow-up in 6 months or sooner as needed  Merton Border, MD PCCM service Mobile [phone] Pager [phone] 11/06/2016

## 2016-12-10 ENCOUNTER — Other Ambulatory Visit: Payer: Self-pay | Admitting: Family Medicine

## 2016-12-10 ENCOUNTER — Other Ambulatory Visit: Payer: Medicaid Other

## 2016-12-10 DIAGNOSIS — I10 Essential (primary) hypertension: Secondary | ICD-10-CM

## 2016-12-10 LAB — COMPLETE METABOLIC PANEL WITH GFR
ALT: 10 U/L (ref 9–46)
AST: 26 U/L (ref 10–35)
Albumin: 3.8 g/dL (ref 3.6–5.1)
Alkaline Phosphatase: 112 U/L (ref 40–115)
BILIRUBIN TOTAL: 0.9 mg/dL (ref 0.2–1.2)
BUN: 6 mg/dL — AB (ref 7–25)
CO2: 23 mmol/L (ref 20–31)
Calcium: 8.9 mg/dL (ref 8.6–10.3)
Chloride: 96 mmol/L — ABNORMAL LOW (ref 98–110)
Creat: 0.9 mg/dL (ref 0.70–1.33)
GFR, Est African American: >89 mL/min (ref >=60)
GLUCOSE: 113 mg/dL — AB (ref 65–99)
POTASSIUM: 3.6 mmol/L (ref 3.5–5.3)
SODIUM: 133 mmol/L — AB (ref 135–146)
TOTAL PROTEIN: 7.5 g/dL (ref 6.1–8.1)

## 2016-12-29 ENCOUNTER — Ambulatory Visit (INDEPENDENT_AMBULATORY_CARE_PROVIDER_SITE_OTHER): Payer: Medicaid Other | Admitting: Family Medicine

## 2016-12-29 ENCOUNTER — Encounter: Payer: Self-pay | Admitting: Family Medicine

## 2016-12-29 VITALS — BP 136/80 | HR 110 | Temp 98.4°F | Resp 16 | Ht 68.0 in | Wt 130.0 lb

## 2016-12-29 DIAGNOSIS — E871 Hypo-osmolality and hyponatremia: Secondary | ICD-10-CM

## 2016-12-29 DIAGNOSIS — I10 Essential (primary) hypertension: Secondary | ICD-10-CM | POA: Diagnosis not present

## 2016-12-29 DIAGNOSIS — R739 Hyperglycemia, unspecified: Secondary | ICD-10-CM

## 2016-12-29 DIAGNOSIS — I739 Peripheral vascular disease, unspecified: Secondary | ICD-10-CM

## 2016-12-29 DIAGNOSIS — J432 Centrilobular emphysema: Secondary | ICD-10-CM | POA: Diagnosis not present

## 2016-12-29 DIAGNOSIS — J3089 Other allergic rhinitis: Secondary | ICD-10-CM | POA: Diagnosis not present

## 2016-12-29 LAB — POCT GLYCOSYLATED HEMOGLOBIN (HGB A1C): HEMOGLOBIN A1C: 5.1

## 2016-12-29 MED ORDER — ALBUTEROL SULFATE HFA 108 (90 BASE) MCG/ACT IN AERS: 2.0000 | INHALATION_SPRAY | Freq: Four times a day (QID) | RESPIRATORY_TRACT | 11 refills | Status: DC | PRN

## 2016-12-29 NOTE — Assessment & Plan Note (Signed)
Stable HTN, controlled on current regimen Complicated by PAD s/p stenting in bilateral femoral arteries Former smoker  Plan: 1. Continue current med regimen - Metoprolol-XL 50mg , Amlodipine 5mg  - has existing refills meds amlodipine 2. Remain smoke free, cessation counseling 3. Monitor BP outside office 4. Follow-up 6 months HTN

## 2016-12-29 NOTE — Assessment & Plan Note (Signed)
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

## 2016-12-29 NOTE — Patient Instructions (Addendum)
Thank you for coming in to clinic today.  1. BP mild elevated - Check blood pressure outside office at [facility] or drug store, write down readings, if persistently >140/90, then can notify office, may need to adjust meds  2. Refilled Albuterol inhaler today  3. You have refills on Amlodipine at pharmacy - let me know if there is a problem with it, it was sent to pharmacy in 09/2016  Consider switching Diphenhydramine to Loratadine CLaritin or Zyrtec 10mg  daily  Please schedule a follow-up appointment with Dr. Parks Ranger in 6 months for HTN, COPD   If you have any other questions or concerns, please feel free to call the clinic or send a message through [place]. You may also schedule an earlier appointment if necessary.  Donald Putnam, DO [place]

## 2016-12-29 NOTE — Assessment & Plan Note (Signed)
Stable, continue current regimen Follow-up with [facility] Refilled Albuterol PRN, using appropriately Continue Advair Counseling to remain smoke free, now off Chantix

## 2016-12-29 NOTE — Progress Notes (Signed)
Subjective:    Patient ID: Donald Handy., male    DOB: 18-Dec-1960, 56 y.o.   MRN: [id number]  Donald [name]. is a 56 y.o. male presenting on 12/29/2016 for Hypertension (Patient here today to fu from 09/29/16. Patient reports good tolerance and compliance with medications. Patient denies any chest pain or swelling around feet or ankles. ); Hyperglycemia; and COPD   HPI   CHRONIC HTN: Reports no concerns today. Occasionally checks BP at [facility], does not recall reading. Current Meds - Metoprolol-XL 50mg  daily, Amlodipine 5mg    Reports good compliance, has not taken meds today, usually takes in afternoon. Tolerating well, w/o complaints. Lifestyle - limited regular exercise Denies CP, dyspnea, HA, edema, dizziness / lightheadedness  Abnormal Glucose in past - Last A1c 5.3 (10/2015), due for repeat A1c - Today A1c 5.1 POC, last A1c 4.8 with labs 09/2016 - No prior history of Pre-DM  HYPONATREMIA, former history of alcohol use - Followed by Nephrology Dr Sherri Rad - Sodium has improved to near normal range now off alcohol and improved diet. No longer on sodium tabs and lasix. - Remains alcohol free  Seasonal Allergies: - Taking OTC diphenhydramine most days during Spring/Summer season. Some relief. Has not tried Loratadine or 2nd gen antihistamine - Denies sedation or grogginess on medication  COPD, Severe / Former Smoker - Followed by Pulmonology Dr Alva Garnet - Today requesting refill Albuterol only uses PRN about 2-3x weekly. No recent flare. Uses Advair daily as prescribed. 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 Smoking status: Former Smoker    Packs/day: 0.50    Years: 35.00    Types: Cigarettes  . Smokeless tobacco: Former Systems developer    Quit date: 08/31/2016  . Alcohol use 0.0 oz/week     Comment: occ   Review of Systems Per HPI unless specifically indicated above     Objective:    BP 136/80 (BP Location: Left Arm, Cuff Size: Normal)   Pulse (!) 110   Temp 98.4 F (36.9 C) (Oral)   Resp 16   Ht 5\' 8"  (1.727 m)   Wt 130 lb (59 kg)   SpO2 100%   BMI 19.77 kg/m   Wt Readings from Last 3 Encounters:  12/29/16 130 lb (59 kg)  11/06/16 129 lb (58.5 kg)  09/29/16 126 lb (57.2 kg)    Physical Exam  Constitutional: He is oriented to person, place, and time. He appears well-developed and well-nourished. No distress.  Appears older than stated age, well-appearing, comfortable, cooperative  HENT:  Head: Normocephalic and atraumatic.  Mouth/Throat: Oropharynx is clear and moist.  .  Eyes: Conjunctivae are normal.  Neck: Normal range of motion. Neck supple. No thyromegaly present.  Cardiovascular: Normal rate, regular rhythm, normal heart sounds and intact distal pulses.   No murmur heard. Pulmonary/Chest: Effort normal. No respiratory distress. He has no wheezes. He has no rales.  Stable, mildly reduced air movement diffusely at baseline but overall decent air movement. Speaks full sentences.  Musculoskeletal: Normal range of motion. He exhibits no edema.  Lymphadenopathy:    He has no cervical adenopathy.  Neurological: He  is alert and oriented to person, place, and time.  Skin: Skin is warm and dry. No rash noted. He is not diaphoretic. No erythema.  Psychiatric: He has a normal mood and affect. His behavior is normal.  Well groomed, good eye contact, normal speech and thoughts  Nursing note and vitals reviewed.  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 Continue current med regimen - Metoprolol-XL 50mg , Amlodipine 5mg  - has existing refills meds amlodipine 2. Remain smoke free, cessation counseling 3. Monitor BP outside office 4. Follow-up 6 months HTN      Environmental and seasonal allergies    Switch Diphenhydramine to 2nd gen anti-histamine such as OTC Loratadine      COPD (chronic obstructive pulmonary disease) (H[facility])    Stable, continue current regimen Follow-up with [facility] Refilled Albuterol PRN, using appropriately Continue Advair Counseling to remain smoke free, now off Chantix      Relevant Medications   albuterol (PROVENTIL HFA;VENTOLIN HFA) 108 (90 Base) MCG/ACT inhaler    Other Visit Diagnoses    High blood sugar       Normal range A1c on last lab, and POC A1c today obtained but will not be needed routinely in future. Now check yearly on labs   Relevant Orders   POCT HgB A1C (Completed)      Meds ordered this encounter  Medications  . albuterol (PROVENTIL HFA;VENTOLIN HFA) 108 (90 Base) MCG/ACT inhaler    Sig: Inhale 2 puffs into the lungs every 6 (six) hours as needed for wheezing or shortness of breath.    Dispense:  1 Inhaler    Refill:  11      Follow up plan: Return in about 6 months (around  06/30/2017) for 6 months for HTN, COPD .  Nobie Putnam, [facility] 12/29/2016, 11:08 PM

## 2016-12-29 NOTE — Assessment & Plan Note (Signed)
Switch Diphenhydramine to 2nd gen anti-histamine such as OTC Loratadine

## 2016-12-29 NOTE — Assessment & Plan Note (Addendum)
Stable chronic PAD s/p bilateral femoral artery stents Without worsening complications Former tobacco abuse, quit 08/2016 On ASA 81mg  daily Control BP Follow-up q 6 months

## 2017-07-06 ENCOUNTER — Ambulatory Visit (INDEPENDENT_AMBULATORY_CARE_PROVIDER_SITE_OTHER): Payer: Medicaid Other | Admitting: Family Medicine

## 2017-07-06 ENCOUNTER — Encounter: Payer: Self-pay | Admitting: Family Medicine

## 2017-07-06 VITALS — BP 120/81 | HR 106 | Temp 98.1°F | Resp 16 | Ht 68.0 in | Wt 116.0 lb

## 2017-07-06 DIAGNOSIS — I10 Essential (primary) hypertension: Secondary | ICD-10-CM | POA: Diagnosis not present

## 2017-07-06 DIAGNOSIS — J432 Centrilobular emphysema: Secondary | ICD-10-CM | POA: Diagnosis not present

## 2017-07-06 DIAGNOSIS — Z23 Encounter for immunization: Secondary | ICD-10-CM

## 2017-07-06 DIAGNOSIS — J3089 Other allergic rhinitis: Secondary | ICD-10-CM | POA: Diagnosis not present

## 2017-07-06 DIAGNOSIS — R636 Underweight: Secondary | ICD-10-CM

## 2017-07-06 DIAGNOSIS — J449 Chronic obstructive pulmonary disease, unspecified: Secondary | ICD-10-CM | POA: Diagnosis not present

## 2017-07-06 MED ORDER — FLUTICASONE-SALMETEROL 250-50 MCG/DOSE IN AEPB: 1.0000 | INHALATION_SPRAY | Freq: Two times a day (BID) | RESPIRATORY_TRACT | 11 refills | Status: DC

## 2017-07-06 MED ORDER — LORATADINE 10 MG PO TABS: 10.0000 mg | ORAL_TABLET | Freq: Every day | ORAL | 11 refills | Status: DC

## 2017-07-06 NOTE — Patient Instructions (Addendum)
Thank you for coming to the clinic today.  1. Keep up the good work  2. Continue improving diet and food intake, may try Ensure for more calories, higher protein  3. Refilled Advair, keep refills on file if not ready  DUE for FASTING BLOOD WORK (no food or drink after midnight before the lab appointment, only water or coffee without cream/sugar on the morning of)  SCHEDULE "Lab Only" visit in the morning at the clinic for lab draw in 6 MONTHS  - Make sure Lab Only appointment is at about 1 week before your next appointment, so that results will be available  For Lab Results, once available within 2-3 days of blood draw, you can can log in to MyChart online to view your results and a brief explanation. Also, we can discuss results at next follow-up visit.  Please schedule a Follow-up Appointment to: Return in about 6 months (around 01/04/2018) for Annual Physical.  If you have any other questions or concerns, please feel free to call the clinic or send a message through [facility]. You may also schedule an earlier appointment if necessary.  Additionally, you may be receiving a survey about your experience at our clinic within a few days to 1 week by e-mail or mail. We value your feedback.  Nobie Putnam, DO [place]

## 2017-07-06 NOTE — Progress Notes (Signed)
Subjective:    Patient ID: Donald Handy., male    DOB: Nov 02, 1960, 56 y.o.   MRN: [id number]  Donald Jandreau. is a 56 y.o. male presenting on 07/06/2017 for Hypertension (obtw as per pt fell 5 months ago back hurts, hurts when cough and sneez and can't sleep due to back and chest pain and hurts to eat lost weight from 130lbs --116 lbs )  HPI  FALL / Injured Sternum / Weight Loss - Reports new injury since last visit fell off deck 8 ft fall >5 months ago, thought it injured his sternum Gradually improving, still occasional pop and discomfort but overall much improved. Initially hurt to take deep breaths, and difficulty eating, and had some weight loss as a result. Reduced PO intake due to fall injury, now improved. Not on protein supplement.  CHRONIC HTN: Reports he checks outside at [facility], normal readings 120/80 Current Meds - Metoprolol-XL 50mg  daily, Amlodipine 5mg    Reports good compliance, has not taken meds today, usually takes in afternoon. Tolerating well, w/o complaints. Lifestyle - limited regular exercise Denies CP, dyspnea, HA, edema, dizziness / lightheadedness  Seasonal Allergies: - Taking OTC diphenhydramine most days during Spring/Summer season. Some relief. Has not tried Loratadine or 2nd gen antihistamine yet - Denies sedation or grogginess on medication  COPD, Severe / Former Smoker - No longer followed by [facility] - Today remains smoke free  Health Maintenance: - Due for Flu Shot, will receive today   Depression screen [facility] 2/9 12/29/2016 10/29/2015 07/29/2015  Decreased Interest 1 0 0  Down, Depressed, Hopeless 0 0 0  PHQ - 2 Score 1 0 0  Altered sleeping 1 - -  Tired, decreased energy 1 - -  Change in appetite 0 - -  Feeling bad or failure about yourself  0 - -  Trouble concentrating 0 - -  Moving slowly or fidgety/restless 0 - -  Suicidal thoughts 0 - -  PHQ-9 Score 3 - -  Difficult doing work/chores Not difficult at all - -    Social History   Substance Use Topics  . Smoking status: Former Smoker    Packs/day: 0.50    Years: 35.00    Types: Cigarettes  . Smokeless tobacco: Former Systems developer    Quit date: 08/31/2016  . Alcohol use 0.0 oz/week     Comment: occ    Review of Systems Per HPI unless specifically indicated above     Objective:    BP 120/81   Pulse (!) 106   Temp 98.1 F (36.7 C) (Oral)   Resp 16   Ht 5\' 8"  (1.727 m)   Wt 116 lb (52.6 kg)   BMI 17.64 kg/m   Wt Readings from Last 3 Encounters:  07/06/17 116 lb (52.6 kg)  12/29/16 130 lb (59 kg)  11/06/16 129 lb (58.5 kg)    Physical Exam  Constitutional: He is oriented to person, place, and time. He appears well-developed and well-nourished. No distress.  Appears older than stated age, thin appearing with recent weight loss, comfortable, cooperative  HENT:  Head: Normocephalic and atraumatic.  Mouth/Throat: Oropharynx is clear and moist.  Eyes: Conjunctivae are normal. Right eye exhibits no discharge. Left eye exhibits no discharge.  Neck: Normal range of motion. Neck supple.  Cardiovascular: Normal rate, regular rhythm, normal heart sounds and intact distal pulses.   No murmur heard. Pulmonary/Chest: Effort normal and breath sounds normal. No respiratory distress. He has no wheezes. He has no rales. He  exhibits no tenderness (No reproducible tenderness).  Musculoskeletal: Normal range of motion. He exhibits no edema.  Neurological: He is alert and oriented to person, place, and time.  Skin: Skin is warm and dry. No rash noted. He is not diaphoretic. No erythema.  Psychiatric: He has a normal mood and affect. His behavior is normal.  Well groomed, good eye contact, normal speech and thoughts  Nursing note and vitals reviewed.  Results for orders placed or performed in visit on 12/29/16  POCT HgB A1C  Result Value Ref Range   Hemoglobin A1C 5.1       Assessment & Plan:   Problem List Items Addressed This Visit    COPD (chronic obstructive  pulmonary disease) ([place])    Stable, continue current regimen No longer followed by [facility] Continue Advair, Albuterol PRN Counseling to remain smoke free      Relevant Medications   Fluticasone-Salmeterol (ADVAIR) 250-50 MCG/DOSE AEPB   loratadine (CLARITIN) 10 MG tablet   Environmental and seasonal allergies    Mild, seasonal related Recommend OTC Loratadine, sent generic rx to pharmacy and stop Diphenhydramine due to side effects      Relevant Medications   loratadine (CLARITIN) 10 MG tablet   Hypertension - Primary    Stable HTN, controlled on current regimen Complicated by PAD s/p stenting in bilateral femoral arteries Former smoker  Plan: 1. Continue current med regimen - Metoprolol-XL 50mg , Amlodipine 5mg  2. Remain smoke free, cessation counseling 3. Monitor BP outside office 4. Follow-up 6 months labs + annual      Low weight    Due to poor PO intake with prior rib injury, now gradually improved Encourage increase protein supplement meal, regular PO       Other Visit Diagnoses    Needs flu shot       Relevant Orders   Flu Vaccine QUAD 6+ mos PF IM (Fluarix Quad PF) (Completed)      Meds ordered this encounter  Medications  . Fluticasone-Salmeterol (ADVAIR) 250-50 MCG/DOSE AEPB    Sig: Inhale 1 puff into the lungs 2 (two) times daily.    Dispense:  60 each    Refill:  11    Do not fill yet, please keep refills on file  . loratadine (CLARITIN) 10 MG tablet    Sig: Take 1 tablet (10 mg total) by mouth daily. Use for 4-6 weeks then stop, and use as needed or seasonally    Dispense:  30 tablet    Refill:  11   Follow up plan: Return in about 6 months (around 01/04/2018) for Annual Physical.  Nobie Putnam, DO [facility] 07/07/2017, 12:50 AM

## 2017-07-07 ENCOUNTER — Other Ambulatory Visit: Payer: Self-pay | Admitting: Family Medicine

## 2017-07-07 DIAGNOSIS — N401 Enlarged prostate with lower urinary tract symptoms: Secondary | ICD-10-CM

## 2017-07-07 DIAGNOSIS — E786 Lipoprotein deficiency: Secondary | ICD-10-CM

## 2017-07-07 DIAGNOSIS — Z125 Encounter for screening for malignant neoplasm of prostate: Secondary | ICD-10-CM

## 2017-07-07 DIAGNOSIS — G6289 Other specified polyneuropathies: Secondary | ICD-10-CM

## 2017-07-07 DIAGNOSIS — E871 Hypo-osmolality and hyponatremia: Secondary | ICD-10-CM

## 2017-07-07 DIAGNOSIS — N138 Other obstructive and reflux uropathy: Secondary | ICD-10-CM

## 2017-07-07 DIAGNOSIS — Z0000 Encounter for general adult medical examination without abnormal findings: Secondary | ICD-10-CM

## 2017-07-07 DIAGNOSIS — R7309 Other abnormal glucose: Secondary | ICD-10-CM

## 2017-07-07 DIAGNOSIS — I10 Essential (primary) hypertension: Secondary | ICD-10-CM

## 2017-07-07 DIAGNOSIS — I739 Peripheral vascular disease, unspecified: Secondary | ICD-10-CM

## 2017-07-07 NOTE — Assessment & Plan Note (Signed)
Stable HTN, controlled on current regimen Complicated by PAD s/p stenting in bilateral femoral arteries Former smoker  Plan: 1. Continue current med regimen - Metoprolol-XL 50mg , Amlodipine 5mg  2. Remain smoke free, cessation counseling 3. Monitor BP outside office 4. Follow-up 6 months labs + annual

## 2017-07-07 NOTE — Assessment & Plan Note (Signed)
Mild, seasonal related Recommend OTC Loratadine, sent generic rx to pharmacy and stop Diphenhydramine due to side effects

## 2017-07-07 NOTE — Assessment & Plan Note (Signed)
Due to poor PO intake with prior rib injury, now gradually improved Encourage increase protein supplement meal, regular PO

## 2017-07-07 NOTE — Assessment & Plan Note (Addendum)
Stable, continue current regimen No longer followed by [facility] Continue Advair, Albuterol PRN Counseling to remain smoke free

## 2017-07-14 ENCOUNTER — Telehealth: Payer: Self-pay | Admitting: Pulmonary Disease

## 2017-07-14 NOTE — Telephone Encounter (Signed)
Patient does not want to be seen for follow up Deleting recall

## 2017-10-13 ENCOUNTER — Other Ambulatory Visit: Payer: Self-pay | Admitting: Family Medicine

## 2017-10-13 DIAGNOSIS — I10 Essential (primary) hypertension: Secondary | ICD-10-CM

## 2017-10-22 ENCOUNTER — Other Ambulatory Visit: Payer: Self-pay

## 2017-10-22 DIAGNOSIS — N138 Other obstructive and reflux uropathy: Secondary | ICD-10-CM

## 2017-10-22 DIAGNOSIS — N401 Enlarged prostate with lower urinary tract symptoms: Principal | ICD-10-CM

## 2017-10-22 MED ORDER — TAMSULOSIN HCL 0.4 MG PO CAPS: 0.4000 mg | ORAL_CAPSULE | Freq: Every day | ORAL | 11 refills | Status: DC

## 2017-12-28 ENCOUNTER — Other Ambulatory Visit: Payer: Medicaid Other

## 2017-12-28 DIAGNOSIS — Z125 Encounter for screening for malignant neoplasm of prostate: Secondary | ICD-10-CM

## 2017-12-28 DIAGNOSIS — E871 Hypo-osmolality and hyponatremia: Secondary | ICD-10-CM

## 2017-12-28 DIAGNOSIS — Z0000 Encounter for general adult medical examination without abnormal findings: Secondary | ICD-10-CM

## 2017-12-28 DIAGNOSIS — E786 Lipoprotein deficiency: Secondary | ICD-10-CM

## 2017-12-28 DIAGNOSIS — R7309 Other abnormal glucose: Secondary | ICD-10-CM

## 2017-12-28 DIAGNOSIS — I739 Peripheral vascular disease, unspecified: Secondary | ICD-10-CM

## 2017-12-28 DIAGNOSIS — I10 Essential (primary) hypertension: Secondary | ICD-10-CM

## 2017-12-28 LAB — CBC WITH DIFFERENTIAL/PLATELET
BASOS PCT: 0.8 %
Basophils Absolute: 57 cells/uL (ref 0–200)
EOS ABS: 170 {cells}/uL (ref 15–500)
Eosinophils Relative: 2.4 %
HCT: 49.6 % (ref 38.5–50.0)
HEMOGLOBIN: 17.9 g/dL — AB (ref 13.2–17.1)
Lymphs Abs: 1562 cells/uL (ref 850–3900)
MCH: 34.8 pg — ABNORMAL HIGH (ref 27.0–33.0)
MCHC: 36.1 g/dL — ABNORMAL HIGH (ref 32.0–36.0)
MCV: 96.3 fL (ref 80.0–100.0)
MONOS PCT: 9.1 %
MPV: 8.7 fL (ref 7.5–12.5)
NEUTROS ABS: 4665 {cells}/uL (ref 1500–7800)
Neutrophils Relative %: 65.7 %
PLATELETS: 216 10*3/uL (ref 140–400)
RBC: 5.15 10*6/uL (ref 4.20–5.80)
RDW: 13.9 % (ref 11.0–15.0)
TOTAL LYMPHOCYTE: 22 %
WBC: 7.1 10*3/uL (ref 3.8–10.8)
WBCMIX: 646 {cells}/uL (ref 200–950)

## 2017-12-28 LAB — COMPLETE METABOLIC PANEL WITH GFR
AG Ratio: 1.1 (calc) (ref 1.0–2.5)
ALBUMIN MSPROF: 3.6 g/dL (ref 3.6–5.1)
ALKALINE PHOSPHATASE (APISO): 93 U/L (ref 40–115)
ALT: 9 U/L (ref 9–46)
AST: 21 U/L (ref 10–35)
BUN / CREAT RATIO: 6 (calc) (ref 6–22)
BUN: 5 mg/dL — ABNORMAL LOW (ref 7–25)
CALCIUM: 9.6 mg/dL (ref 8.6–10.3)
CO2: 28 mmol/L (ref 20–32)
CREATININE: 0.82 mg/dL (ref 0.70–1.33)
Chloride: 99 mmol/L (ref 98–110)
GFR, EST NON AFRICAN AMERICAN: 100 mL/min/{1.73_m2} (ref >=60)
GFR, Est African American: 116 mL/min/{1.73_m2} (ref >=60)
GLOBULIN: 3.4 g/dL (ref 1.9–3.7)
GLUCOSE: 124 mg/dL — AB (ref 65–99)
Potassium: 4.6 mmol/L (ref 3.5–5.3)
SODIUM: 132 mmol/L — AB (ref 135–146)
TOTAL PROTEIN: 7 g/dL (ref 6.1–8.1)
Total Bilirubin: 1.7 mg/dL — ABNORMAL HIGH (ref 0.2–1.2)

## 2017-12-28 LAB — LIPID PANEL
CHOL/HDL RATIO: 3.1 (calc) (ref <5.0)
CHOLESTEROL: 141 mg/dL (ref <200)
HDL: 46 mg/dL (ref >40)
LDL CHOLESTEROL (CALC): 81 mg/dL
Non-HDL Cholesterol (Calc): 95 mg/dL (calc) (ref <130)
TRIGLYCERIDES: 68 mg/dL (ref <150)

## 2017-12-29 LAB — PSA, TOTAL WITH REFLEX TO PSA, FREE: PSA, Total: 0.6 ng/mL (ref <=4.0)

## 2017-12-29 LAB — HEMOGLOBIN A1C
HEMOGLOBIN A1C: 4.7 %{Hb} (ref <5.7)
Mean Plasma Glucose: 88 (calc)
eAG (mmol/L): 4.9 (calc)

## 2018-01-04 ENCOUNTER — Ambulatory Visit (INDEPENDENT_AMBULATORY_CARE_PROVIDER_SITE_OTHER): Payer: Medicaid Other | Admitting: Family Medicine

## 2018-01-04 ENCOUNTER — Encounter: Payer: Self-pay | Admitting: Family Medicine

## 2018-01-04 ENCOUNTER — Encounter: Payer: Medicaid Other | Admitting: Family Medicine

## 2018-01-04 VITALS — BP 110/73 | HR 95 | Temp 97.8°F | Resp 16 | Ht 68.0 in | Wt 116.4 lb

## 2018-01-04 DIAGNOSIS — R636 Underweight: Secondary | ICD-10-CM | POA: Diagnosis not present

## 2018-01-04 DIAGNOSIS — E871 Hypo-osmolality and hyponatremia: Secondary | ICD-10-CM

## 2018-01-04 DIAGNOSIS — I10 Essential (primary) hypertension: Secondary | ICD-10-CM | POA: Diagnosis not present

## 2018-01-04 DIAGNOSIS — I739 Peripheral vascular disease, unspecified: Secondary | ICD-10-CM

## 2018-01-04 DIAGNOSIS — Z720 Tobacco use: Secondary | ICD-10-CM

## 2018-01-04 DIAGNOSIS — J3089 Other allergic rhinitis: Secondary | ICD-10-CM

## 2018-01-04 DIAGNOSIS — F1021 Alcohol dependence, in remission: Secondary | ICD-10-CM

## 2018-01-04 DIAGNOSIS — J432 Centrilobular emphysema: Secondary | ICD-10-CM

## 2018-01-04 MED ORDER — VARENICLINE TARTRATE 0.5 MG X 11 & 1 MG X 42 PO MISC: ORAL | 0 refills | Status: DC

## 2018-01-04 NOTE — Patient Instructions (Addendum)
Thank you for coming to the office today.  For Chantix sent new rx to pharmacy, try to quit again using this.  - Set quit date 1 week after start of medication, alternatively if unable to quit, then set goal reduce by 50% or more by 4 weeks, then another 50% reduction in 4 more weeks, and lastly quit after final 4 weeks (total 12 week therapy)  Notify our office by 3 weeks if ready to continue med we can send in new rx with refills to continue up to 3 months total  DUE for FASTING BLOOD WORK (no food or drink after midnight before the lab appointment, only water or coffee without cream/sugar on the morning of)  SCHEDULE "Lab Only" visit in the morning at the clinic for lab draw in 3 MONTHS   - Make sure Lab Only appointment is at about 1 week before your next appointment, so that results will be available  For Lab Results, once available within 2-3 days of blood draw, you can can log in to MyChart online to view your results and a brief explanation. Also, we can discuss results at next follow-up visit.   Please schedule a Follow-up Appointment to: Return in about 3 months (around 04/05/2018) for Smoking Cessation / ?Liver US discuss.  If you have any other questions or concerns, please feel free to call the office or send a message through [place]. You may also schedule an earlier appointment if necessary.  Additionally, you may be receiving a survey about your experience at our office within a few days to 1 week by e-mail or mail. We value your feedback.  Donald Putnam, DO [facility]

## 2018-01-04 NOTE — Progress Notes (Signed)
Subjective:    Patient ID: Donald [name]., male    DOB: [birth year], 57 y.o.   MRN: [id number]  Chirstopher [name]. is a 57 y.o. male presenting on 01/04/2018 for Hypertension   HPI   Here for Annual Physical and Lab Review  Fatty Liver Infiltration / Elevated Hemoglobin - Previous history in 2015 of RUQ abdomen US with fatty liver changes, is due for repeat abdominal US - Hemoglobin up to 17.9 from previous 14.9 to 15.9  CHRONIC HTN: Reports he is not checking BP outside office Current Meds - Metoprolol-XL 50mg  daily, Amlodipine 5mg  (later in afternoon / evening) Reports good compliance, has not taken meds today, usually takes in PM (took last night). Tolerating well, w/o complaints. Lifestyle - limited regular exercise - No significant lifestyle changes  History of Weight Loss He has gained half lb to lb in 6 months Not on protein supplement but states he has improved diet  Seasonal Allergies: taking Loratadine  COPD / Resumed Active smoker w/ TOBACCO ABUSE, H/o PAD s/p stents in legs - Previously quit smoking on Chantix for about 1 year, from 09/2016 to 09/2017, now resumed, states just picked up smoking again, no real trigger. He is back to smoking 0.5ppd daily. Wants to re-try chantix again to help quit, took him 3 to 6 months last time - he has history of COPD, controlled on current Advair now, and rarely uses Albuterol - He does not want to return to [place] Maintenance: Up to date lab screening UTD Colonoscopy  Depression screen [facility] 2/9 01/04/2018 12/29/2016 10/29/2015  Decreased Interest 0 1 0  Down, Depressed, Hopeless 0 0 0  PHQ - 2 Score 0 1 0  Altered sleeping - 1 -  Tired, decreased energy - 1 -  Change in appetite - 0 -  Feeling bad or failure about yourself  - 0 -  Trouble concentrating - 0 -  Moving slowly or fidgety/restless - 0 -  Suicidal thoughts - 0 -  PHQ-9 Score - 3 -  Difficult doing work/chores - Not difficult at all -      Past Medical History:  Diagnosis Date  . Depression   . Emphysema of lung ([place])   . Essential hypertension   . History of chicken pox   . Low weight   . Prostate disease   . Seasonal allergies   . Seizures ([place])    Past Surgical History:  Procedure Laterality Date  . COLONOSCOPY WITH PROPOFOL N/A 02/25/2016   Procedure: COLONOSCOPY WITH PROPOFOL;  Surgeon: [name], MD;  Location: [facility] ENDOSCOPY;  Service: Endoscopy;  Laterality: N/A;  . EYE SURGERY  2015   Cataract  . none     Social History   Socioeconomic History  . Marital status: Single    Spouse name: Not on file  . Number of children: Not on file  . Years of education: Not on file  . Highest education level: Not on file  Occupational History  . Not on file  Social Needs  . Financial resource strain: Not on file  . Food insecurity:    Worry: Not on file    Inability: Not on file  . Transportation needs:    Medical: Not on file    Non-medical: Not on file  Tobacco Use  . Smoking status: Current Every Day Smoker    Packs/day: 0.50    Years: 35.00    Pack years: 17.50    Types:  Cigarettes  . Smokeless tobacco: Former Systems developer    Quit date: 08/31/2016  . Tobacco comment: Previously quit in 09/2016 on Chantix until 09/2017  Substance and Sexual Activity  . Alcohol use: Yes    Alcohol/week: 0.0 oz    Comment: occ  . Drug use: No  . Sexual activity: Not on file  Lifestyle  . Physical activity:    Days per week: Not on file    Minutes per session: Not on file  . Stress: Not on file  Relationships  . Social connections:    Talks on phone: Not on file    Gets together: Not on file    Attends religious service: Not on file    Active member of club or organization: Not on file    Attends meetings of clubs or organizations: Not on file    Relationship status: Not on file  . Intimate partner violence:    Fear of current or ex partner: Not on file    Emotionally abused: Not on file    Physically abused:  Not on file    Forced sexual activity: Not on file  Other Topics Concern  . Not on file  Social History Narrative  . Not on file   Family History  Problem Relation Age of Onset  . Ulcers Father        bleeding  . Cancer Mother        Breast   Current Outpatient Medications on File Prior to Visit  Medication Sig  . albuterol (PROVENTIL HFA;VENTOLIN HFA) 108 (90 Base) MCG/ACT inhaler Inhale 2 puffs into the lungs every 6 (six) hours as needed for wheezing or shortness of breath.  Marland Kitchen amLODipine (NORVASC) 5 MG tablet Take 1 tablet (5 mg total) by mouth daily.  Marland Kitchen aspirin 81 MG tablet Take 1 tablet (81 mg total) by mouth daily.  . Fluticasone-Salmeterol (ADVAIR) 250-50 MCG/DOSE AEPB Inhale 1 puff into the lungs 2 (two) times daily.  Marland Kitchen loratadine (CLARITIN) 10 MG tablet Take 1 tablet (10 mg total) by mouth daily. Use for 4-6 weeks then stop, and use as needed or seasonally  . metoprolol succinate (TOPROL-XL) 50 MG 24 hr tablet Take 1 tablet (50 mg total) by mouth daily.  . tamsulosin (FLOMAX) 0.4 MG CAPS capsule Take 1 capsule (0.4 mg total) by mouth daily after supper.   No current facility-administered medications on file prior to visit.     Review of Systems  Constitutional: Negative for activity change, appetite change, chills, diaphoresis, fatigue and fever.  HENT: Negative for congestion and hearing loss.   Eyes: Negative for visual disturbance.  Respiratory: Positive for cough. Negative for apnea, chest tightness, shortness of breath and wheezing.   Cardiovascular: Negative for chest pain, palpitations and leg swelling.  Gastrointestinal: Negative for abdominal distention, abdominal pain, anal bleeding, blood in stool, constipation, diarrhea, nausea and vomiting.  Endocrine: Negative for cold intolerance.  Genitourinary: Negative for dysuria, frequency and hematuria.  Musculoskeletal: Negative for arthralgias and neck pain.  Skin: Negative for rash.  Allergic/Immunologic:  Negative for environmental allergies.  Neurological: Negative for dizziness, weakness, light-headedness, numbness and headaches.  Hematological: Negative for adenopathy.  Psychiatric/Behavioral: Negative for behavioral problems, dysphoric mood and sleep disturbance.   Per HPI unless specifically indicated above     Objective:    BP 110/73   Pulse 95   Temp 97.8 F (36.6 C) (Oral)   Resp 16   Ht 5\' 8"  (1.727 m)   Wt 116 lb 6.4  oz (52.8 kg)   BMI 17.70 kg/m   Wt Readings from Last 3 Encounters:  01/04/18 116 lb 6.4 oz (52.8 kg)  07/06/17 116 lb (52.6 kg)  12/29/16 130 lb (59 kg)    Physical Exam  Constitutional: He is oriented to person, place, and time. He appears well-developed and well-nourished. No distress.  Chronically ill but currently well appearing, thin, comfortable, cooperative  HENT:  Head: Normocephalic and atraumatic.  Mouth/Throat: Oropharynx is clear and moist.  Eyes: Pupils are equal, round, and reactive to light. Conjunctivae and EOM are normal. Right eye exhibits no discharge. Left eye exhibits no discharge.  Neck: Normal range of motion. Neck supple. No thyromegaly present.  Cardiovascular: Normal rate, regular rhythm, normal heart sounds and intact distal pulses.  No murmur heard. Pulmonary/Chest: Effort normal and breath sounds normal. No respiratory distress. He has no wheezes. He has no rales.  Abdominal: Soft. Bowel sounds are normal. He exhibits no distension and no mass. There is no tenderness.  No hepatomegaly on liver percussion  Musculoskeletal: Normal range of motion. He exhibits no edema or tenderness.  Upper / Lower Extremities: - Normal muscle tone, strength bilateral upper extremities 5/5, lower extremities 5/5  Lymphadenopathy:    He has no cervical adenopathy.  Neurological: He is alert and oriented to person, place, and time.  Distal sensation intact to light touch all extremities  Skin: Skin is warm and dry. No rash noted. He is not  diaphoretic. No erythema.  Psychiatric: He has a normal mood and affect. His behavior is normal.  Well groomed, good eye contact, normal speech and thoughts  Nursing note and vitals reviewed.  Results for orders placed or performed in visit on 12/28/17  PSA, Total with Reflex to PSA, Free  Result Value Ref Range   PSA, Total 0.6 < OR = 4.0 ng/mL  CBC with Differential/Platelet  Result Value Ref Range   WBC 7.1 3.8 - 10.8 Thousand/uL   RBC 5.15 4.20 - 5.80 Million/uL   Hemoglobin 17.9 (H) 13.2 - 17.1 g/dL   HCT 49.6 38.5 - 50.0 %   MCV 96.3 80.0 - 100.0 fL   MCH 34.8 (H) 27.0 - 33.0 pg   MCHC 36.1 (H) 32.0 - 36.0 g/dL   RDW 13.9 11.0 - 15.0 %   Platelets 216 140 - 400 Thousand/uL   MPV 8.7 7.5 - 12.5 fL   Neutro Abs 4,665 1,500 - 7,800 cells/uL   Lymphs Abs 1,562 850 - 3,900 cells/uL   WBC mixed population 646 200 - 950 cells/uL   Eosinophils Absolute 170 15 - 500 cells/uL   Basophils Absolute 57 0 - 200 cells/uL   Neutrophils Relative % 65.7 %   Total Lymphocyte 22.0 %   Monocytes Relative 9.1 %   Eosinophils Relative 2.4 %   Basophils Relative 0.8 %  Lipid panel  Result Value Ref Range   Cholesterol 141 <200 mg/dL   HDL 46 >40 mg/dL   Triglycerides 68 <150 mg/dL   LDL Cholesterol (Calc) 81 mg/dL (calc)   Total CHOL/HDL Ratio 3.1 <5.0 (calc)   Non-HDL Cholesterol (Calc) 95 <130 mg/dL (calc)  Hemoglobin A1c  Result Value Ref Range   Hgb A1c MFr Bld 4.7 <5.7 % of total Hgb   Mean Plasma Glucose 88 (calc)   eAG (mmol/L) 4.9 (calc)  COMPLETE METABOLIC PANEL WITH GFR  Result Value Ref Range   Glucose, Bld 124 (H) 65 - 99 mg/dL   BUN 5 (L) 7 - 25 mg/dL  Creat 0.82 0.70 - 1.33 mg/dL   GFR, Est Non African American 100 > OR = 60 mL/min/1.82m2   GFR, Est African American 116 > OR = 60 mL/min/1.29m2   BUN/Creatinine Ratio 6 6 - 22 (calc)   Sodium 132 (L) 135 - 146 mmol/L   Potassium 4.6 3.5 - 5.3 mmol/L   Chloride 99 98 - 110 mmol/L   CO2 28 20 - 32 mmol/L   Calcium 9.6  8.6 - 10.3 mg/dL   Total Protein 7.0 6.1 - 8.1 g/dL   Albumin 3.6 3.6 - 5.1 g/dL   Globulin 3.4 1.9 - 3.7 g/dL (calc)   AG Ratio 1.1 1.0 - 2.5 (calc)   Total Bilirubin 1.7 (H) 0.2 - 1.2 mg/dL   Alkaline phosphatase (APISO) 93 40 - 115 U/L   AST 21 10 - 35 U/L   ALT 9 9 - 46 U/L      Assessment & Plan:   Problem List Items Addressed This Visit    Alcohol dependence, in remission ([place])    Reportedly off alcohol Still has sodium derangement on lab and abnormal history of liver test Counseling remain off alcohol Quit smoking now with Chantix Follow-up labs repeat then consider RUQ Abd [place] concern w/ history of fatty liver infiltration      COPD (chronic obstructive pulmonary disease) ([facility]) - Primary    Stable, continue current regimen No longer followed by [facility] Continue Advair, Albuterol PRN Now smoking again - rx Chantix to help quit f/u within 3 months, refill sooner by 1 month when ready      Relevant Medications   varenicline (CHANTIX PAK) 0.5 MG X 11 & 1 MG X 42 tablet   Environmental and seasonal allergies   Hypertension    Stable HTN, controlled on current regimen Complicated by PAD s/p stenting in bilateral femoral arteries Now active smoker again  Plan: 1. Continue current med regimen - Metoprolol-XL 50mg , Amlodipine 5mg  2. Quit smoking - Chantix rx today again 3. Monitor BP outside office 4. 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 Start Varenicline (Chantix) Day 1-3: 0.5mg  once daily WITH FOOD, Days 4-7: increase to 0.5mg  BID, then maintenance dose after 1 week: 1mg  BID for up to 12 weeks - Set quit date 1 week after start of medication, alternatively if unable to quit, then set goal reduce by 50% or more by 4 weeks, then another 50% reduction in 4 more weeks, and lastly quit after final 4 weeks (total 12 week therapy) - Optional additional 12 weeks if needed for maintenance - Counseling on side effects primarily nausea (take with food), headaches, insomnia, vivid dreams, mood instability, seizure, rare risk of suicidal ideation, if any serious agitation or acute depression severe mood  changes need to stop med      Relevant Medications   varenicline (CHANTIX PAK) 0.5 MG X 11 & 1 MG X 42 tablet      Meds ordered this encounter  Medications  . varenicline (CHANTIX PAK) 0.5 MG X 11 & 1 MG X 42 tablet    Sig: Take one 0.5 mg tab by mouth once daily for 3 days, increase to one 0.5 mg twice daily for 4 days, then increase to one 1 mg twice daily.    Dispense:  53 tablet    Refill:  0    Follow up plan: Return in about 3 months (around 04/05/2018) for Smoking Cessation / ?Liver US discuss.  Future labs ordered for 03/2018  Nobie Putnam, [facility] 01/05/2018, 12:47 AM

## 2018-01-05 ENCOUNTER — Other Ambulatory Visit: Payer: Self-pay | Admitting: Family Medicine

## 2018-01-05 DIAGNOSIS — I739 Peripheral vascular disease, unspecified: Secondary | ICD-10-CM

## 2018-01-05 DIAGNOSIS — I10 Essential (primary) hypertension: Secondary | ICD-10-CM

## 2018-01-05 DIAGNOSIS — F1021 Alcohol dependence, in remission: Secondary | ICD-10-CM

## 2018-01-05 DIAGNOSIS — K760 Fatty (change of) liver, not elsewhere classified: Secondary | ICD-10-CM

## 2018-01-05 NOTE — Assessment & Plan Note (Signed)
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

## 2018-01-05 NOTE — Assessment & Plan Note (Signed)
Stable HTN, controlled on current regimen Complicated by PAD s/p stenting in bilateral femoral arteries Now active smoker again  Plan: 1. Continue current med regimen - Metoprolol-XL 50mg , Amlodipine 5mg  2. Quit smoking - Chantix rx today again 3. Monitor BP outside office 4. Follow-up 3 mo

## 2018-01-05 NOTE — Assessment & Plan Note (Signed)
Stable, continue current regimen No longer followed by [facility] Continue Advair, Albuterol PRN Now smoking again - rx Chantix to help quit f/u within 3 months, refill sooner by 1 month when ready

## 2018-01-05 NOTE — Assessment & Plan Note (Signed)
Resumed smoking again after 1 year quit on chantix Ready to quit again  Plan: 1. Start Varenicline (Chantix) Day 1-3: 0.5mg  once daily WITH FOOD, Days 4-7: increase to 0.5mg  BID, then maintenance dose after 1 week: 1mg  BID for up to 12 weeks - Set quit date 1 week after start of medication, alternatively if unable to quit, then set goal reduce by 50% or more by 4 weeks, then another 50% reduction in 4 more weeks, and lastly quit after final 4 weeks (total 12 week therapy) - Optional additional 12 weeks if needed for maintenance - Counseling on side effects primarily nausea (take with food), headaches, insomnia, vivid dreams, mood instability, seizure, rare risk of suicidal ideation, if any serious agitation or acute depression severe mood changes need to stop med

## 2018-01-05 NOTE — Assessment & Plan Note (Addendum)
Stable chronic PAD s/p bilateral femoral artery stents Without worsening complications Resumed active smoker - rx chantix today to help quit On ASA 81mg  daily Control BP

## 2018-01-05 NOTE — Assessment & Plan Note (Signed)
Weight stable to improved now in past 4-5 months Encourage improve protein and nutrition

## 2018-01-05 NOTE — Assessment & Plan Note (Signed)
Reportedly off alcohol Still has sodium derangement on lab and abnormal history of liver test Counseling remain off alcohol Quit smoking now with Chantix Follow-up labs repeat then consider RUQ Abd [place] concern w/ history of fatty liver infiltration

## 2018-03-16 ENCOUNTER — Other Ambulatory Visit: Payer: Self-pay | Admitting: Family Medicine

## 2018-03-16 DIAGNOSIS — I10 Essential (primary) hypertension: Secondary | ICD-10-CM

## 2018-03-30 ENCOUNTER — Other Ambulatory Visit: Payer: Medicaid Other

## 2018-03-30 DIAGNOSIS — K760 Fatty (change of) liver, not elsewhere classified: Secondary | ICD-10-CM

## 2018-03-30 DIAGNOSIS — I10 Essential (primary) hypertension: Secondary | ICD-10-CM

## 2018-03-31 LAB — CBC WITH DIFFERENTIAL/PLATELET
BASOS ABS: 59 {cells}/uL (ref 0–200)
Basophils Relative: 1.1 %
EOS ABS: 130 {cells}/uL (ref 15–500)
Eosinophils Relative: 2.4 %
HCT: 46.8 % (ref 38.5–50.0)
Hemoglobin: 16.9 g/dL (ref 13.2–17.1)
Lymphs Abs: 1377 cells/uL (ref 850–3900)
MCH: 34.7 pg — AB (ref 27.0–33.0)
MCHC: 36.1 g/dL — ABNORMAL HIGH (ref 32.0–36.0)
MCV: 96.1 fL (ref 80.0–100.0)
MONOS PCT: 12.5 %
MPV: 10 fL (ref 7.5–12.5)
Neutro Abs: 3159 cells/uL (ref 1500–7800)
Neutrophils Relative %: 58.5 %
PLATELETS: 170 10*3/uL (ref 140–400)
RBC: 4.87 10*6/uL (ref 4.20–5.80)
RDW: 12.8 % (ref 11.0–15.0)
TOTAL LYMPHOCYTE: 25.5 %
WBC mixed population: 675 cells/uL (ref 200–950)
WBC: 5.4 10*3/uL (ref 3.8–10.8)

## 2018-03-31 LAB — COMPLETE METABOLIC PANEL WITH GFR
AG RATIO: 1.1 (calc) (ref 1.0–2.5)
ALBUMIN MSPROF: 4 g/dL (ref 3.6–5.1)
ALKALINE PHOSPHATASE (APISO): 81 U/L (ref 40–115)
ALT: 13 U/L (ref 9–46)
AST: 20 U/L (ref 10–35)
BILIRUBIN TOTAL: 0.8 mg/dL (ref 0.2–1.2)
BUN/Creatinine Ratio: 5 (calc) — ABNORMAL LOW (ref 6–22)
BUN: 3 mg/dL — ABNORMAL LOW (ref 7–25)
CO2: 26 mmol/L (ref 20–32)
CREATININE: 0.61 mg/dL — AB (ref 0.70–1.33)
Calcium: 9.7 mg/dL (ref 8.6–10.3)
Chloride: 97 mmol/L — ABNORMAL LOW (ref 98–110)
GFR, Est African American: 130 mL/min/{1.73_m2} (ref >=60)
GFR, Est Non African American: 112 mL/min/{1.73_m2} (ref >=60)
GLOBULIN: 3.5 g/dL (ref 1.9–3.7)
Glucose, Bld: 86 mg/dL (ref 65–99)
Potassium: 4.4 mmol/L (ref 3.5–5.3)
SODIUM: 134 mmol/L — AB (ref 135–146)
Total Protein: 7.5 g/dL (ref 6.1–8.1)

## 2018-04-05 ENCOUNTER — Ambulatory Visit: Payer: Medicaid Other | Admitting: Family Medicine

## 2018-04-11 ENCOUNTER — Other Ambulatory Visit: Payer: Self-pay | Admitting: Family Medicine

## 2018-04-11 DIAGNOSIS — I10 Essential (primary) hypertension: Secondary | ICD-10-CM

## 2018-04-12 ENCOUNTER — Ambulatory Visit: Payer: Medicaid Other | Admitting: Family Medicine

## 2018-04-12 ENCOUNTER — Encounter: Payer: Self-pay | Admitting: Family Medicine

## 2018-04-12 VITALS — BP 110/80 | HR 94 | Temp 98.3°F | Resp 16 | Ht 68.0 in | Wt 107.8 lb

## 2018-04-12 DIAGNOSIS — I10 Essential (primary) hypertension: Secondary | ICD-10-CM

## 2018-04-12 DIAGNOSIS — R636 Underweight: Secondary | ICD-10-CM

## 2018-04-12 DIAGNOSIS — E440 Moderate protein-calorie malnutrition: Secondary | ICD-10-CM

## 2018-04-12 DIAGNOSIS — E46 Unspecified protein-calorie malnutrition: Secondary | ICD-10-CM | POA: Insufficient documentation

## 2018-04-12 DIAGNOSIS — G4700 Insomnia, unspecified: Secondary | ICD-10-CM | POA: Insufficient documentation

## 2018-04-12 DIAGNOSIS — F5101 Primary insomnia: Secondary | ICD-10-CM

## 2018-04-12 DIAGNOSIS — J432 Centrilobular emphysema: Secondary | ICD-10-CM | POA: Diagnosis not present

## 2018-04-12 DIAGNOSIS — Z87891 Personal history of nicotine dependence: Secondary | ICD-10-CM

## 2018-04-12 MED ORDER — MIRTAZAPINE 15 MG PO TABS: 15.0000 mg | ORAL_TABLET | Freq: Every day | ORAL | 2 refills | Status: DC

## 2018-04-12 NOTE — Assessment & Plan Note (Signed)
Uncertain exact etiology, seems primarily poor appetite low weight in COPD patient also with history of liver disease.  Plan Given insomnia as well - will start Mirtazapine 15mg  nightly for appetite stim and insomnia Advised start Ensure daily for protein supplement, improve intake if possible higher calorie/protein foods Follow-up if not improving may need referral to nutritionist in future

## 2018-04-12 NOTE — Assessment & Plan Note (Signed)
Stable HTN, controlled on current regimen Complicated by PAD s/p stenting in bilateral femoral arteries Former smoker now again, quit 01/2018  Plan: 1. Continue current med regimen - Metoprolol-XL 50mg , Amlodipine 5mg  2. Smoking cessation 3. Monitor BP outside office 4. Follow-up 3 mo

## 2018-04-12 NOTE — Assessment & Plan Note (Signed)
Underlying problem, without evidence of depression or anxiety Will start Mirtzapine 15mg  nightly - titrate dose, follow-up

## 2018-04-12 NOTE — Assessment & Plan Note (Signed)
Congratulated on remaining smoke free since 01/2018 on chantix Continue maintain smoking cessation

## 2018-04-12 NOTE — Assessment & Plan Note (Signed)
Stable, continue current regimen Congratulated on quitting smoking 01/2018 on Chantix Remain smoke free Continue Advair, Albuterol PRN No longer followed by [facility]

## 2018-04-12 NOTE — Progress Notes (Signed)
Subjective:    Patient ID: Donald [name]., male    DOB: 05-04-1961, 58 y.o.   MRN: [id number]  Donald [name]. is a 57 y.o. male presenting on 04/12/2018 for COPD   HPI    Fatty Liver Infiltration / Elevated Hemoglobin Last visit 12/2017 - interval update now with repeat labs showed Improved hemoglobin, to 16.9 now. Normal LFTs and resolved elevated Bilirubin back to normal range. - Previous history in 2015 of RUQ abdomen US with fatty liver changes. Denies abdominal pain, dark stool, nausea vomiting  CHRONIC HTN: Reports he is not checking BP outside office Current Meds - Metoprolol-XL 50mg  daily, Amlodipine 5mg  (later in afternoon / evening) Reports good compliance, has not taken meds today, usually takes in PM (took last night). Tolerating well, w/o complaints. Lifestyle - limited regular exercise - No significant lifestyle changes  Protein Calorie Malnutrition, Moderate / History of Weight Loss / Insomnia Still since last visit has lost weight, down 8 lbs in 3 months. Admits poor appetite still, without other symptoms or factors affecting his appetite or other concerns. He takes occasional ensure, not daily. - Also admits some insomnia - difficulty staying asleep. Not on medication - Denies depression or anxiety  COPD / Former Smoker / H/o PAD s/p stents in legs Recent update, since last visit 12/2017 when given rx Chantix. He has now again quit smoking on Chantix after 3 weeks in 01/2018, prior quit attempts. Doing well smoke free now. Continues on Advair daily, and has back up emergency Albuterol - not using regularly, doing well - He does not want to return to [place] Denies dyspnea, coughing spells, flare up, chest pain  Health Maintenance:  Due for Flu Shot will get at [employer] and he can have it faxed or delivered, record.  Depression screen [facility] 2/9 04/12/2018 01/04/2018 12/29/2016  Decreased Interest 0 0 1  Down, Depressed, Hopeless 0 0 0    PHQ - 2 Score 0 0 1  Altered sleeping - - 1  Tired, decreased energy - - 1  Change in appetite - - 0  Feeling bad or failure about yourself  - - 0  Trouble concentrating - - 0  Moving slowly or fidgety/restless - - 0  Suicidal thoughts - - 0  PHQ-9 Score - - 3  Difficult doing work/chores - - Not difficult at all    Social History   Tobacco Use  . Smoking status: Former Smoker    Packs/day: 0.50    Years: 35.00    Pack years: 17.50    Types: Cigarettes    Last attempt to quit: 01/25/2018    Years since quitting: 0.2  . Smokeless tobacco: Former Systems developer    Quit date: 08/31/2016  . Tobacco comment: Previously quit in 09/2016 on Chantix until 09/2017  Substance Use Topics  . Alcohol use: Yes    Alcohol/week: 0.0 oz    Comment: occ  . Drug use: No    Review of Systems Per HPI unless specifically indicated above     Objective:    BP 110/80   Pulse 94   Temp 98.3 F (36.8 C) (Oral)   Resp 16   Ht 5\' 8"  (1.727 m)   Wt 107 lb 12.8 oz (48.9 kg)   BMI 16.39 kg/m   Wt Readings from Last 3 Encounters:  04/12/18 107 lb 12.8 oz (48.9 kg)  01/04/18 116 lb 6.4 oz (52.8 kg)  07/06/17 116 lb (52.6 kg)  Physical Exam  Constitutional: He is oriented to person, place, and time. He appears well-developed and well-nourished. No distress.  Chronically ill but currently well appearing, thin, comfortable, cooperative  HENT:  Head: Normocephalic and atraumatic.  Mouth/Throat: Oropharynx is clear and moist.  Eyes: Pupils are equal, round, and reactive to light. Conjunctivae and EOM are normal. Right eye exhibits no discharge. Left eye exhibits no discharge.  Neck: Normal range of motion. Neck supple. No thyromegaly present.  Cardiovascular: Normal rate, regular rhythm, normal heart sounds and intact distal pulses.  No murmur heard. Pulmonary/Chest: Effort normal and breath sounds normal. No respiratory distress. He has no wheezes. He has no rales.  Abdominal: Soft. Bowel sounds are  normal. He exhibits no distension and no mass. There is no tenderness.  No hepatomegaly on liver percussion  Musculoskeletal: Normal range of motion. He exhibits no edema.  Some muscle atrophy facial and upper body  Lymphadenopathy:    He has no cervical adenopathy.  Neurological: He is alert and oriented to person, place, and time.  Skin: Skin is warm and dry. No rash noted. He is not diaphoretic. No erythema.  Psychiatric: He has a normal mood and affect. His behavior is normal.  Well groomed, good eye contact, normal speech and thoughts  Nursing note and vitals reviewed.  Results for orders placed or performed in visit on 03/30/18  CBC with Differential/Platelet  Result Value Ref Range   WBC 5.4 3.8 - 10.8 Thousand/uL   RBC 4.87 4.20 - 5.80 Million/uL   Hemoglobin 16.9 13.2 - 17.1 g/dL   HCT 46.8 38.5 - 50.0 %   MCV 96.1 80.0 - 100.0 fL   MCH 34.7 (H) 27.0 - 33.0 pg   MCHC 36.1 (H) 32.0 - 36.0 g/dL   RDW 12.8 11.0 - 15.0 %   Platelets 170 140 - 400 Thousand/uL   MPV 10.0 7.5 - 12.5 fL   Neutro Abs 3,159 1,500 - 7,800 cells/uL   Lymphs Abs 1,377 850 - 3,900 cells/uL   WBC mixed population 675 200 - 950 cells/uL   Eosinophils Absolute 130 15 - 500 cells/uL   Basophils Absolute 59 0 - 200 cells/uL   Neutrophils Relative % 58.5 %   Total Lymphocyte 25.5 %   Monocytes Relative 12.5 %   Eosinophils Relative 2.4 %   Basophils Relative 1.1 %  COMPLETE METABOLIC PANEL WITH GFR  Result Value Ref Range   Glucose, Bld 86 65 - 99 mg/dL   BUN 3 (L) 7 - 25 mg/dL   Creat 0.61 (L) 0.70 - 1.33 mg/dL   GFR, Est Non African American 112 > OR = 60 mL/min/1.58m2   GFR, Est African American 130 > OR = 60 mL/min/1.16m2   BUN/Creatinine Ratio 5 (L) 6 - 22 (calc)   Sodium 134 (L) 135 - 146 mmol/L   Potassium 4.4 3.5 - 5.3 mmol/L   Chloride 97 (L) 98 - 110 mmol/L   CO2 26 20 - 32 mmol/L   Calcium 9.7 8.6 - 10.3 mg/dL   Total Protein 7.5 6.1 - 8.1 g/dL   Albumin 4.0 3.6 - 5.1 g/dL   Globulin  3.5 1.9 - 3.7 g/dL (calc)   AG Ratio 1.1 1.0 - 2.5 (calc)   Total Bilirubin 0.8 0.2 - 1.2 mg/dL   Alkaline phosphatase (APISO) 81 40 - 115 U/L   AST 20 10 - 35 U/L   ALT 13 9 - 46 U/L      Assessment & Plan:   Problem  List Items Addressed This Visit    COPD (chronic obstructive pulmonary disease) ([place]) - Primary    Stable, continue current regimen Congratulated on quitting smoking 01/2018 on Chantix Remain smoke free Continue Advair, Albuterol PRN No longer followed by [facility]      Former smoker    Congratulated on remaining smoke free since 01/2018 on chantix Continue maintain smoking cessation      Hypertension    Stable HTN, controlled on current regimen Complicated by PAD s/p stenting in bilateral femoral arteries Former smoker now again, quit 01/2018  Plan: 1. Continue current med regimen - Metoprolol-XL 50mg , Amlodipine 5mg  2. Smoking cessation 3. Monitor BP outside office 4. Follow-up 3 mo      Insomnia    Underlying problem, without evidence of depression or anxiety Will start Mirtzapine 15mg  nightly - titrate dose, follow-up      Relevant Medications   mirtazapine (REMERON) 15 MG tablet   Low weight   Protein calorie malnutrition (HCC)    Uncertain exact etiology, seems primarily poor appetite low weight in COPD patient also with history of liver disease.  Plan Given insomnia as well - will start Mirtazapine 15mg  nightly for appetite stim and insomnia Advised start Ensure daily for protein supplement, improve intake if possible higher calorie/protein foods Follow-up if not improving may need referral to nutritionist in future      Relevant Medications   mirtazapine (REMERON) 15 MG tablet      Meds ordered this encounter  Medications  . mirtazapine (REMERON) 15 MG tablet    Sig: Take 1 tablet (15 mg total) by mouth at bedtime.    Dispense:  30 tablet    Refill:  2    Follow up plan: Return in about 3 months (around 07/13/2018) for Insomnia,  Weight check, COPD (smoking state update).  Nobie Putnam, DO [facility] 04/12/2018, 1:20 PM

## 2018-04-12 NOTE — Patient Instructions (Addendum)
Thank you for coming to the office today.  Congratulations on quitting smoking again - keep up the good work.  Blood pressure is controlled.  Liver blood work is improved. Blood count is back to normal.  We can monitor blood in the future - consider abdominal ultrasound again.  For now concern weight loss - and sleep - will start mirtazapine (remeron) 15mg  nightly - may take few nights to take effect, help sleep and increase appetite  Recommend starting Ensure daily for meal supplement with protein. Focus on high protein  Please schedule a Follow-up Appointment to: Return in about 3 months (around 07/13/2018) for Insomnia, Weight check, COPD (smoking state update).  If you have any other questions or concerns, please feel free to call the office or send a message through [place]. You may also schedule an earlier appointment if necessary.  Additionally, you may be receiving a survey about your experience at our office within a few days to 1 week by e-mail or mail. We value your feedback.  Nobie Putnam, DO [place]

## 2018-05-11 ENCOUNTER — Encounter: Payer: Self-pay | Admitting: *Deleted

## 2018-05-11 ENCOUNTER — Other Ambulatory Visit: Payer: Self-pay

## 2018-05-12 NOTE — Discharge Instructions (Signed)

## 2018-05-18 ENCOUNTER — Ambulatory Visit
Admission: RE | Admit: 2018-05-18 | Discharge: 2018-05-18 | Disposition: A | Discharge status: Home / Self Care | Payer: Medicaid Other | Source: Ambulatory Visit | Attending: Ophthalmology | Admitting: Ophthalmology

## 2018-05-18 ENCOUNTER — Ambulatory Visit: Payer: Medicaid Other | Admitting: Anesthesiology

## 2018-05-18 ENCOUNTER — Encounter
Admission: RE | Disposition: A | Discharge status: Home / Self Care | Payer: Self-pay | Source: Ambulatory Visit | Attending: Ophthalmology

## 2018-05-18 DIAGNOSIS — N400 Enlarged prostate without lower urinary tract symptoms: Secondary | ICD-10-CM | POA: Insufficient documentation

## 2018-05-18 DIAGNOSIS — Z79899 Other long term (current) drug therapy: Secondary | ICD-10-CM | POA: Diagnosis not present

## 2018-05-18 DIAGNOSIS — H2512 Age-related nuclear cataract, left eye: Secondary | ICD-10-CM | POA: Diagnosis present

## 2018-05-18 DIAGNOSIS — Z87891 Personal history of nicotine dependence: Secondary | ICD-10-CM | POA: Insufficient documentation

## 2018-05-18 DIAGNOSIS — I10 Essential (primary) hypertension: Secondary | ICD-10-CM | POA: Diagnosis not present

## 2018-05-18 DIAGNOSIS — J449 Chronic obstructive pulmonary disease, unspecified: Secondary | ICD-10-CM | POA: Insufficient documentation

## 2018-05-18 DIAGNOSIS — I739 Peripheral vascular disease, unspecified: Secondary | ICD-10-CM | POA: Diagnosis not present

## 2018-05-18 HISTORY — DX: Peripheral vascular disease, unspecified: I73.9

## 2018-05-18 HISTORY — PX: CATARACT EXTRACTION W/PHACO: SHX586

## 2018-05-18 HISTORY — DX: Polyneuropathy, unspecified: G62.9

## 2018-05-18 HISTORY — DX: Presence of dental prosthetic device (complete) (partial): Z97.2

## 2018-05-18 SURGERY — PHACOEMULSIFICATION, CATARACT, WITH IOL INSERTION
Anesthesia: Monitor Anesthesia Care | Site: Eye | Laterality: Left

## 2018-05-18 MED ORDER — NA HYALUR & NA CHOND-NA HYALUR 0.4-0.35 ML IO KIT
PACK | INTRAOCULAR | Status: DC | PRN
  Administered 2018-05-18: 1 mL via INTRAOCULAR

## 2018-05-18 MED ORDER — MOXIFLOXACIN HCL 0.5 % OP SOLN
1.0000 [drp] | OPHTHALMIC | Status: DC | PRN
  Administered 2018-05-18 (×3): 1 [drp] via OPHTHALMIC

## 2018-05-18 MED ORDER — EPINEPHRINE PF 1 MG/ML IJ SOLN
INTRAOCULAR | Status: DC | PRN
  Administered 2018-05-18: 57 mL via OPHTHALMIC

## 2018-05-18 MED ORDER — ONDANSETRON HCL 4 MG/2ML IJ SOLN: 4.0000 mg | Freq: Once | INTRAMUSCULAR | Status: DC | PRN

## 2018-05-18 MED ORDER — FENTANYL CITRATE (PF) 100 MCG/2ML IJ SOLN
INTRAMUSCULAR | Status: DC | PRN
  Administered 2018-05-18 (×2): 50 ug via INTRAVENOUS

## 2018-05-18 MED ORDER — BRIMONIDINE TARTRATE-TIMOLOL 0.2-0.5 % OP SOLN
OPHTHALMIC | Status: DC | PRN
  Administered 2018-05-18: 1 [drp] via OPHTHALMIC

## 2018-05-18 MED ORDER — LIDOCAINE HCL (PF) 2 % IJ SOLN
INTRAOCULAR | Status: DC | PRN
  Administered 2018-05-18: .5 mL

## 2018-05-18 MED ORDER — ARMC OPHTHALMIC DILATING DROPS
1.0000 "application " | OPHTHALMIC | Status: DC | PRN
  Administered 2018-05-18 (×3): 1 via OPHTHALMIC

## 2018-05-18 MED ORDER — MIDAZOLAM HCL 2 MG/2ML IJ SOLN
INTRAMUSCULAR | Status: DC | PRN
  Administered 2018-05-18: 2 mg via INTRAVENOUS

## 2018-05-18 MED ORDER — CEFUROXIME OPHTHALMIC INJECTION 1 MG/0.1 ML
INJECTION | OPHTHALMIC | Status: DC | PRN
  Administered 2018-05-18: 0.1 mL via OPHTHALMIC

## 2018-05-18 MED ORDER — LACTATED RINGERS IV SOLN: 10.0000 mL/h | INTRAVENOUS | Status: DC

## 2018-05-18 SURGICAL SUPPLY — 28 items
CANNULA ANT/CHMB 27G (MISCELLANEOUS) ×1 IMPLANT
CANNULA ANT/CHMB 27GA (MISCELLANEOUS) ×3 IMPLANT
CARTRIDGE ABBOTT (MISCELLANEOUS) IMPLANT
GLOVE SURG LX 7.5 STRW (GLOVE) ×2
GLOVE SURG LX STRL 7.5 STRW (GLOVE) ×1 IMPLANT
GLOVE SURG TRIUMPH 8.0 PF LTX (GLOVE) ×3 IMPLANT
GOWN STRL REUS W/ TWL LRG LVL3 (GOWN DISPOSABLE) ×2 IMPLANT
GOWN STRL REUS W/TWL LRG LVL3 (GOWN DISPOSABLE) ×4
LENS IOL TECNIS ITEC 22.5 (Intraocular Lens) ×2 IMPLANT
MARKER SKIN DUAL TIP RULER LAB (MISCELLANEOUS) ×3 IMPLANT
NDL FILTER BLUNT 18X1 1/2 (NEEDLE) ×1 IMPLANT
NDL RETROBULBAR .5 NSTRL (NEEDLE) IMPLANT
NEEDLE FILTER BLUNT 18X 1/2SAF (NEEDLE) ×2
NEEDLE FILTER BLUNT 18X1 1/2 (NEEDLE) ×1 IMPLANT
PACK CATARACT BRASINGTON (MISCELLANEOUS) ×3 IMPLANT
PACK EYE AFTER SURG (MISCELLANEOUS) ×3 IMPLANT
PACK OPTHALMIC (MISCELLANEOUS) ×3 IMPLANT
RING MALYGIN 7.0 (MISCELLANEOUS) ×2 IMPLANT
SUT ETHILON 10-0 CS-B-6CS-B-6 (SUTURE)
SUT VICRYL  9 0 (SUTURE)
SUT VICRYL 9 0 (SUTURE) IMPLANT
SUTURE EHLN 10-0 CS-B-6CS-B-6 (SUTURE) IMPLANT
SYR 3ML LL SCALE MARK (SYRINGE) ×3 IMPLANT
SYR 5ML LL (SYRINGE) ×3 IMPLANT
SYR TB 1ML LUER SLIP (SYRINGE) ×3 IMPLANT
WATER STERILE IRR 500ML POUR (IV SOLUTION) ×3 IMPLANT
WICK EYE OCUCEL (MISCELLANEOUS) ×2 IMPLANT
WIPE NON LINTING 3.25X3.25 (MISCELLANEOUS) ×3 IMPLANT

## 2018-05-18 NOTE — Anesthesia Procedure Notes (Signed)
Procedure Name: MAC Date/Time: 05/18/2018 11:41 AM Performed by: Janna Arch, CRNA Pre-anesthesia Checklist: Patient identified, Emergency Drugs available, Suction available, Timeout performed and Patient being monitored Patient Re-evaluated:Patient Re-evaluated prior to induction Oxygen Delivery Method: Nasal cannula Placement Confirmation: positive ETCO2

## 2018-05-18 NOTE — Anesthesia Preprocedure Evaluation (Addendum)
Anesthesia Evaluation  Patient identified by MRN, date of birth, ID band Patient awake    Reviewed: Allergy & Precautions, NPO status   Airway Mallampati: II  TM Distance: >3 FB     Dental  (+) Edentulous Upper, Poor Dentition   Pulmonary COPD, former smoker,    breath sounds clear to auscultation       Cardiovascular hypertension, + Peripheral Vascular Disease   Rhythm:Regular Rate:Normal     Neuro/Psych Seizures -,  Depression Hx alcohol dependence, in remissionSensory ataxia  Neuromuscular disease (lower extremity mononeuropathy)    GI/Hepatic   Endo/Other  Underweight - protein calorie malnutrition  Renal/GU      Musculoskeletal   Abdominal   Peds  Hematology   Anesthesia Other Findings   Reproductive/Obstetrics                            Anesthesia Physical Anesthesia Plan  ASA: II  Anesthesia Plan: MAC   Post-op Pain Management:    Induction:   PONV Risk Score and Plan:   Airway Management Planned: Nasal Cannula  Additional Equipment:   Intra-op Plan:   Post-operative Plan:   Informed Consent: I have reviewed the patients History and Physical, chart, labs and discussed the procedure including the risks, benefits and alternatives for the proposed anesthesia with the patient or authorized representative who has indicated his/her understanding and acceptance.     Plan Discussed with: CRNA  Anesthesia Plan Comments:         Anesthesia Quick Evaluation

## 2018-05-18 NOTE — Transfer of Care (Signed)
Immediate Anesthesia Transfer of Care Note  Patient: Donald [name].  Procedure(s) Performed: CATARACT EXTRACTION PHACO AND INTRAOCULAR LENS PLACEMENT (IOC) LEFT (Left Eye)  Patient Location: PACU  Anesthesia Type: MAC  Level of Consciousness: awake, alert  and patient cooperative  Airway and Oxygen Therapy: Patient Spontanous Breathing and Patient connected to supplemental oxygen  Post-op Assessment: Post-op Vital signs reviewed, Patient's Cardiovascular Status Stable, Respiratory Function Stable, Patent Airway and No signs of Nausea or vomiting  Post-op Vital Signs: Reviewed and stable  Complications: No apparent anesthesia complications

## 2018-05-18 NOTE — H&P (Signed)
The History and Physical notes are on paper, have been signed, and are to be scanned. The patient remains stable and unchanged from the H&P.   Previous H&P reviewed, patient examined, and there are no changes.  Donald [name] 05/18/2018 10:45 AM

## 2018-05-18 NOTE — Op Note (Signed)
OPERATIVE NOTE  Jebediah Macrae [id number] 05/18/2018  PREOPERATIVE DIAGNOSIS:   Nuclear sclerotic cataract left eye with miotic pupil      H25.12   POSTOPERATIVE DIAGNOSIS:   Nuclear sclerotic cataract left eye with miotic pupil.     PROCEDURE:  Phacoemulsification with posterior chamber intraocular lens implantation of the left eye which required pupil stretching with the Malyugin pupil expansion device   LENS:   Implant Name Type Inv. Item Serial No. Manufacturer Lot No. LRB No. Used  LENS IOL DIOP 22.5 - [id number] Intraocular Lens LENS IOL DIOP 22.5 [id number]  Left 1        ULTRASOUND TIME: 12 % of 0 minutes, 52 seconds.  CDE 6.7   SURGEON:  Wyonia Hough, MD   ANESTHESIA: Topical with tetracaine drops and 2% Xylocaine jelly, augmented with 1% preservative-free intracameral lidocaine.   COMPLICATIONS:  None.   DESCRIPTION OF PROCEDURE:  The patient was identified in the holding room and transported to the operating room and placed in the supine position under the operating microscope.  The left eye was identified as the operative eye and it was prepped and draped in the usual sterile ophthalmic fashion.   A 1 millimeter clear-corneal paracentesis was made at the 1:30 position.  The anterior chamber was filled with Viscoat viscoelastic.  0.5 ml of preservative-free 1% lidocaine was injected into the anterior chamber.  A 2.4 millimeter keratome was used to make a near-clear corneal incision at the 10:30 position.  A Malyugin pupil expander was then placed through the main incision and into the anterior chamber of the eye.  The edge of the iris was secured on the lip of the pupil expander and it was released, thereby expanding the pupil to approximately 7 millimeters for completion of the cataract surgery.  Additional Viscoat was placed in the anterior chamber.  A cystotome and capsulorrhexis forceps were used to make a curvilinear capsulorrhexis.   Balanced salt solution  was used to hydrodissect and hydrodelineate the lens nucleus.   Phacoemulsification was used in stop and chop fashion to remove the lens, nucleus and epinucleus.  The remaining cortex was aspirated using the irrigation aspiration handpiece.  Additional Provisc was placed into the eye to distend the capsular bag for lens placement.  A lens was then injected into the capsular bag.  The pupil expanding ring was removed using a Kuglen hook and insertion device. The remaining viscoelastic was aspirated from the capsular bag and the anterior chamber.  The anterior chamber was filled with balanced salt solution to inflate to a physiologic pressure.   Wounds were hydrated with balanced salt solution.  The anterior chamber was inflated to a physiologic pressure with balanced salt solution.  No wound leaks were noted. Cefuroxime 0.1 ml of a 10mg /ml solution was injected into the anterior chamber for a dose of 1 mg of intracameral antibiotic at the completion of the case.   Timolol and Brimonidine drops were applied to the eye.  The patient was taken to the recovery room in stable condition without complications of anesthesia or surgery.  Haden Cavenaugh 05/18/2018, 12:02 PM

## 2018-05-18 NOTE — Anesthesia Postprocedure Evaluation (Signed)
Anesthesia Post Note  Patient: Donald [name].  Procedure(s) Performed: CATARACT EXTRACTION PHACO AND INTRAOCULAR LENS PLACEMENT (IOC) LEFT (Left Eye)  Patient location during evaluation: PACU Anesthesia Type: MAC Level of consciousness: awake and alert Pain management: pain level controlled Vital Signs Assessment: post-procedure vital signs reviewed and stable Respiratory status: spontaneous breathing, nonlabored ventilation, respiratory function stable and patient connected to nasal cannula oxygen Cardiovascular status: stable and blood pressure returned to baseline Postop Assessment: no apparent nausea or vomiting Anesthetic complications: no    Veda Canning

## 2018-05-19 ENCOUNTER — Encounter: Payer: Self-pay | Admitting: Ophthalmology

## 2018-07-14 ENCOUNTER — Other Ambulatory Visit: Payer: Self-pay | Admitting: Family Medicine

## 2018-07-14 ENCOUNTER — Ambulatory Visit: Payer: Medicaid Other | Admitting: Family Medicine

## 2018-07-14 ENCOUNTER — Encounter: Payer: Self-pay | Admitting: Family Medicine

## 2018-07-14 VITALS — BP 115/81 | HR 83 | Temp 97.8°F | Resp 16 | Ht 68.0 in | Wt 114.0 lb

## 2018-07-14 DIAGNOSIS — Z23 Encounter for immunization: Secondary | ICD-10-CM

## 2018-07-14 DIAGNOSIS — F1021 Alcohol dependence, in remission: Secondary | ICD-10-CM

## 2018-07-14 DIAGNOSIS — I10 Essential (primary) hypertension: Secondary | ICD-10-CM | POA: Diagnosis not present

## 2018-07-14 DIAGNOSIS — E440 Moderate protein-calorie malnutrition: Secondary | ICD-10-CM

## 2018-07-14 DIAGNOSIS — F5101 Primary insomnia: Secondary | ICD-10-CM | POA: Diagnosis not present

## 2018-07-14 DIAGNOSIS — N401 Enlarged prostate with lower urinary tract symptoms: Secondary | ICD-10-CM

## 2018-07-14 DIAGNOSIS — R7309 Other abnormal glucose: Secondary | ICD-10-CM

## 2018-07-14 DIAGNOSIS — J432 Centrilobular emphysema: Secondary | ICD-10-CM

## 2018-07-14 DIAGNOSIS — G6289 Other specified polyneuropathies: Secondary | ICD-10-CM

## 2018-07-14 DIAGNOSIS — N138 Other obstructive and reflux uropathy: Secondary | ICD-10-CM

## 2018-07-14 DIAGNOSIS — E871 Hypo-osmolality and hyponatremia: Secondary | ICD-10-CM

## 2018-07-14 DIAGNOSIS — I739 Peripheral vascular disease, unspecified: Secondary | ICD-10-CM

## 2018-07-14 MED ORDER — MEGESTROL ACETATE 40 MG PO TABS: 80.0000 mg | ORAL_TABLET | Freq: Every day | ORAL | 2 refills | Status: DC

## 2018-07-14 NOTE — Patient Instructions (Addendum)
Thank you for coming to the office today.  Flu Shot today  Start Megace for weight gain / appetite - start 2 pills a day and then increase up to 4 pills in a day - split dosing to 2 pills twice a day. - May take 3 months to get improvement  DUE for FASTING BLOOD WORK (no food or drink after midnight before the lab appointment, only water or coffee without cream/sugar on the morning of)  SCHEDULE "Lab Only" visit in the morning at the clinic for lab draw in 6 MONTHS   - Make sure Lab Only appointment is at about 1 week before your next appointment, so that results will be available  For Lab Results, once available within 2-3 days of blood draw, you can can log in to MyChart online to view your results and a brief explanation. Also, we can discuss results at next follow-up visit.   Please schedule a Follow-up Appointment to: Return in about 6 months (around 01/12/2019) for Yearly Medicaid Checkup.  If you have any other questions or concerns, please feel free to call the office or send a message through [place]. You may also schedule an earlier appointment if necessary.  Additionally, you may be receiving a survey about your experience at our office within a few days to 1 week by e-mail or mail. We value your feedback.  Nobie Putnam, DO [place]

## 2018-07-14 NOTE — Progress Notes (Signed)
Subjective:    Patient ID: Donald Handy., male    DOB: December 27, 1960, 57 y.o.   MRN: [id number]  Donald [name]. is a 57 y.o. male presenting on 07/14/2018 for Hypertension; Insomnia; and Weight Check   HPI   CHRONIC HTN: Reportshe is not checking BP outside office. Normal today Current Meds - Metoprolol-XL 50mg  daily, Amlodipine 5mg (later in afternoon / evening) Reports good compliance. Tolerating well, w/o complaints. Lifestyle - limited regular exercise - No significant lifestyle changes Denies CP, dyspnea, HA, edema, dizziness / lightheadedness  Protein Calorie Malnutrition, Moderate / History of Weight Loss / Insomnia Weight gain 6-7 lbs in 3 months. Improved appetite - still limited PO intake but able to take in some more. Tried mirtazapine but had issue with constipation, after 2 month, stopped taking it, and felt better. Tried stool softeners. - Also admits some insomnia - difficulty staying asleep. Failed Mirtazapine 15mg  - Denies depression or anxiety  Health Maintenance: Due for Flu Shot, will receive today    Depression screen [facility] 2/9 07/14/2018 04/12/2018 01/04/2018  Decreased Interest 0 0 0  Down, Depressed, Hopeless 0 0 0  PHQ - 2 Score 0 0 0  Altered sleeping - - -  Tired, decreased energy - - -  Change in appetite - - -  Feeling bad or failure about yourself  - - -  Trouble concentrating - - -  Moving slowly or fidgety/restless - - -  Suicidal thoughts - - -  PHQ-9 Score - - -  Difficult doing work/chores - - -    Social History   Tobacco Use  . Smoking status: Former Smoker    Packs/day: 0.50    Years: 35.00    Pack years: 17.50    Types: Cigarettes    Last attempt to quit: 01/25/2018    Years since quitting: 0.4  . Smokeless tobacco: Former Systems developer    Quit date: 08/31/2016  . Tobacco comment: Previously quit in 09/2016 on Chantix until 09/2017  Substance Use Topics  . Alcohol use: Not Currently    Alcohol/week: 0.0 standard drinks   Comment: occ  . Drug use: No    Review of Systems Per HPI unless specifically indicated above     Objective:    BP 115/81   Pulse 83   Temp 97.8 F (36.6 C) (Oral)   Resp 16   Ht 5\' 8"  (1.727 m)   Wt 114 lb (51.7 kg)   BMI 17.33 kg/m   Wt Readings from Last 3 Encounters:  07/14/18 114 lb (51.7 kg)  05/18/18 107 lb (48.5 kg)  04/12/18 107 lb 12.8 oz (48.9 kg)    Physical Exam  Constitutional: He is oriented to person, place, and time. He appears well-developed and well-nourished. No distress.  Chronically ill but currently well appearing, thin, comfortable, cooperative   HENT:  Head: Normocephalic and atraumatic.  Mouth/Throat: Oropharynx is clear and moist.  Eyes: Conjunctivae are normal. Right eye exhibits no discharge. Left eye exhibits no discharge.  Neck: Normal range of motion. Neck supple. No thyromegaly present.  Cardiovascular: Normal rate, regular rhythm, normal heart sounds and intact distal pulses.  No murmur heard. Pulmonary/Chest: Effort normal and breath sounds normal. No respiratory distress. He has no wheezes. He has no rales.  Musculoskeletal: Normal range of motion. He exhibits no edema.  Lymphadenopathy:    He has no cervical adenopathy.  Neurological: He is alert and oriented to person, place, and time.  Skin: Skin is warm  and dry. No rash noted. He is not diaphoretic. No erythema.  Psychiatric: He has a normal mood and affect. His behavior is normal.  Well groomed, good eye contact, normal speech and thoughts  Nursing note and vitals reviewed.  Results for orders placed or performed in visit on 03/30/18  CBC with Differential/Platelet  Result Value Ref Range   WBC 5.4 3.8 - 10.8 Thousand/uL   RBC 4.87 4.20 - 5.80 Million/uL   Hemoglobin 16.9 13.2 - 17.1 g/dL   HCT 46.8 38.5 - 50.0 %   MCV 96.1 80.0 - 100.0 fL   MCH 34.7 (H) 27.0 - 33.0 pg   MCHC 36.1 (H) 32.0 - 36.0 g/dL   RDW 12.8 11.0 - 15.0 %   Platelets 170 140 - 400 Thousand/uL   MPV  10.0 7.5 - 12.5 fL   Neutro Abs 3,159 1,500 - 7,800 cells/uL   Lymphs Abs 1,377 850 - 3,900 cells/uL   WBC mixed population 675 200 - 950 cells/uL   Eosinophils Absolute 130 15 - 500 cells/uL   Basophils Absolute 59 0 - 200 cells/uL   Neutrophils Relative % 58.5 %   Total Lymphocyte 25.5 %   Monocytes Relative 12.5 %   Eosinophils Relative 2.4 %   Basophils Relative 1.1 %  COMPLETE METABOLIC PANEL WITH GFR  Result Value Ref Range   Glucose, Bld 86 65 - 99 mg/dL   BUN 3 (L) 7 - 25 mg/dL   Creat 0.61 (L) 0.70 - 1.33 mg/dL   GFR, Est Non African American 112 > OR = 60 mL/min/1.41m2   GFR, Est African American 130 > OR = 60 mL/min/1.27m2   BUN/Creatinine Ratio 5 (L) 6 - 22 (calc)   Sodium 134 (L) 135 - 146 mmol/L   Potassium 4.4 3.5 - 5.3 mmol/L   Chloride 97 (L) 98 - 110 mmol/L   CO2 26 20 - 32 mmol/L   Calcium 9.7 8.6 - 10.3 mg/dL   Total Protein 7.5 6.1 - 8.1 g/dL   Albumin 4.0 3.6 - 5.1 g/dL   Globulin 3.5 1.9 - 3.7 g/dL (calc)   AG Ratio 1.1 1.0 - 2.5 (calc)   Total Bilirubin 0.8 0.2 - 1.2 mg/dL   Alkaline phosphatase (APISO) 81 40 - 115 U/L   AST 20 10 - 35 U/L   ALT 13 9 - 46 U/L      Assessment & Plan:   Problem List Items Addressed This Visit    Essential hypertension    Stable HTN, controlled on current regimen Complicated by PAD s/p stenting in bilateral femoral arteries Former smoker now again, quit 01/2018  Plan: 1. Continue current med regimen - Metoprolol-XL 50mg , Amlodipine 5mg  2. Smoking cessation 3. Monitor BP outside office 4. Follow-up 6 months      Insomnia - Primary    Persistent issue, somewhat improved Failed mirtazapine due to constipation Follow-up - defer to change med today      Protein calorie malnutrition ([place])    Some wt gain since last visit, improved appetite Still poor PO intake Low weight in COPD patient also with history of liver disease.  Plan Trial on Megace 40mg  tab - take 2 tabs daily for 80mg  for first 1-2 weeks then  gradual increase up to 80mg  BID as tolerated - goal for inc appetite / wt gain - counseling on potential risk side effect, variety of concerns including edema, DVT  Advised continue Ensure daily for protein supplement, improve intake if possible higher calorie/protein foods  Follow-up if not improving may need referral to nutritionist in future      Relevant Medications   megestrol (MEGACE) 40 MG tablet    Other Visit Diagnoses    Needs flu shot       Relevant Orders   Flu Vaccine QUAD 36+ mos IM (Completed)      Meds ordered this encounter  Medications  . megestrol (MEGACE) 40 MG tablet    Sig: Take 2-4 tablets (80-160 mg total) by mouth daily. Start with 2 tablets a day, then gradually increase as tolerated up to 4 per day    Dispense:  120 tablet    Refill:  2    Follow up plan: Return in about 6 months (around 01/12/2019) for Yearly Medicaid Checkup.  Future labs ordered for 01/05/19  Nobie Putnam, [facility] 07/14/2018, 10:47 AM

## 2018-07-14 NOTE — Assessment & Plan Note (Addendum)
Some wt gain since last visit, improved appetite Still poor PO intake Low weight in COPD patient also with history of liver disease.  Plan Trial on Megace 40mg  tab - take 2 tabs daily for 80mg  for first 1-2 weeks then gradual increase up to 80mg  BID as tolerated - goal for inc appetite / wt gain - counseling on potential risk side effect, variety of concerns including edema, DVT  Advised continue Ensure daily for protein supplement, improve intake if possible higher calorie/protein foods Follow-up if not improving may need referral to nutritionist in future

## 2018-07-14 NOTE — Assessment & Plan Note (Signed)
Persistent issue, somewhat improved Failed mirtazapine due to constipation Follow-up - defer to change med today

## 2018-07-14 NOTE — Assessment & Plan Note (Signed)
Stable HTN, controlled on current regimen Complicated by PAD s/p stenting in bilateral femoral arteries Former smoker now again, quit 01/2018  Plan: 1. Continue current med regimen - Metoprolol-XL 50mg , Amlodipine 5mg  2. Smoking cessation 3. Monitor BP outside office 4. Follow-up 6 months

## 2018-07-20 ENCOUNTER — Other Ambulatory Visit: Payer: Self-pay | Admitting: Family Medicine

## 2018-07-20 DIAGNOSIS — J3089 Other allergic rhinitis: Secondary | ICD-10-CM

## 2018-11-30 ENCOUNTER — Other Ambulatory Visit: Payer: Self-pay | Admitting: Family Medicine

## 2018-11-30 DIAGNOSIS — N401 Enlarged prostate with lower urinary tract symptoms: Principal | ICD-10-CM

## 2018-11-30 DIAGNOSIS — N138 Other obstructive and reflux uropathy: Secondary | ICD-10-CM

## 2018-12-14 ENCOUNTER — Other Ambulatory Visit: Payer: Self-pay | Admitting: Family Medicine

## 2018-12-14 DIAGNOSIS — I10 Essential (primary) hypertension: Secondary | ICD-10-CM

## 2018-12-23 ENCOUNTER — Other Ambulatory Visit: Payer: Self-pay | Admitting: Family Medicine

## 2018-12-23 DIAGNOSIS — I10 Essential (primary) hypertension: Secondary | ICD-10-CM

## 2019-01-05 ENCOUNTER — Other Ambulatory Visit: Payer: Self-pay

## 2019-01-05 ENCOUNTER — Other Ambulatory Visit: Payer: Medicaid Other

## 2019-01-05 DIAGNOSIS — I10 Essential (primary) hypertension: Secondary | ICD-10-CM

## 2019-01-05 DIAGNOSIS — I739 Peripheral vascular disease, unspecified: Secondary | ICD-10-CM

## 2019-01-05 DIAGNOSIS — E440 Moderate protein-calorie malnutrition: Secondary | ICD-10-CM

## 2019-01-05 DIAGNOSIS — N138 Other obstructive and reflux uropathy: Secondary | ICD-10-CM

## 2019-01-05 DIAGNOSIS — R7309 Other abnormal glucose: Secondary | ICD-10-CM

## 2019-01-05 DIAGNOSIS — E871 Hypo-osmolality and hyponatremia: Secondary | ICD-10-CM

## 2019-01-05 DIAGNOSIS — N401 Enlarged prostate with lower urinary tract symptoms: Principal | ICD-10-CM

## 2019-01-06 LAB — CBC WITH DIFFERENTIAL/PLATELET
Absolute Monocytes: 848 cells/uL (ref 200–950)
Basophils Absolute: 59 cells/uL (ref 0–200)
Basophils Relative: 0.7 %
Eosinophils Absolute: 479 cells/uL (ref 15–500)
Eosinophils Relative: 5.7 %
HCT: 44.2 % (ref 38.5–50.0)
Hemoglobin: 15.4 g/dL (ref 13.2–17.1)
Lymphs Abs: 2369 cells/uL (ref 850–3900)
MCH: 34.8 pg — ABNORMAL HIGH (ref 27.0–33.0)
MCHC: 34.8 g/dL (ref 32.0–36.0)
MCV: 100 fL (ref 80.0–100.0)
MPV: 9.5 fL (ref 7.5–12.5)
Monocytes Relative: 10.1 %
Neutro Abs: 4645 cells/uL (ref 1500–7800)
Neutrophils Relative %: 55.3 %
Platelets: 285 10*3/uL (ref 140–400)
RBC: 4.42 10*6/uL (ref 4.20–5.80)
RDW: 12.1 % (ref 11.0–15.0)
Total Lymphocyte: 28.2 %
WBC: 8.4 10*3/uL (ref 3.8–10.8)

## 2019-01-06 LAB — COMPLETE METABOLIC PANEL WITH GFR
AG Ratio: 1.3 (calc) (ref 1.0–2.5)
ALT: 11 U/L (ref 9–46)
AST: 15 U/L (ref 10–35)
Albumin: 4.1 g/dL (ref 3.6–5.1)
Alkaline phosphatase (APISO): 55 U/L (ref 35–144)
BUN: 11 mg/dL (ref 7–25)
CO2: 24 mmol/L (ref 20–32)
Calcium: 10.2 mg/dL (ref 8.6–10.3)
Chloride: 105 mmol/L (ref 98–110)
Creat: 0.93 mg/dL (ref 0.70–1.33)
GFR, Est African American: 107 mL/min/{1.73_m2} (ref >=60)
GFR, Est Non African American: 92 mL/min/{1.73_m2} (ref >=60)
Globulin: 3.2 g/dL (calc) (ref 1.9–3.7)
Glucose, Bld: 79 mg/dL (ref 65–99)
Potassium: 4.6 mmol/L (ref 3.5–5.3)
Sodium: 139 mmol/L (ref 135–146)
Total Bilirubin: 0.6 mg/dL (ref 0.2–1.2)
Total Protein: 7.3 g/dL (ref 6.1–8.1)

## 2019-01-06 LAB — LIPID PANEL
Cholesterol: 143 mg/dL (ref <200)
HDL: 49 mg/dL (ref >=40)
LDL Cholesterol (Calc): 76 mg/dL (calc)
Non-HDL Cholesterol (Calc): 94 mg/dL (calc) (ref <130)
Total CHOL/HDL Ratio: 2.9 (calc) (ref <5.0)
Triglycerides: 101 mg/dL (ref <150)

## 2019-01-06 LAB — PSA: PSA: 0.1 ng/mL (ref <=4.0)

## 2019-01-06 LAB — HEMOGLOBIN A1C
Hgb A1c MFr Bld: 4.9 % of total Hgb (ref <5.7)
Mean Plasma Glucose: 94 (calc)
eAG (mmol/L): 5.2 (calc)

## 2019-01-12 ENCOUNTER — Other Ambulatory Visit: Payer: Self-pay

## 2019-01-12 ENCOUNTER — Encounter: Payer: Self-pay | Admitting: Family Medicine

## 2019-01-12 ENCOUNTER — Other Ambulatory Visit: Payer: Self-pay | Admitting: Family Medicine

## 2019-01-12 ENCOUNTER — Ambulatory Visit (INDEPENDENT_AMBULATORY_CARE_PROVIDER_SITE_OTHER): Payer: Medicaid Other | Admitting: Family Medicine

## 2019-01-12 VITALS — Wt 120.7 lb

## 2019-01-12 DIAGNOSIS — I10 Essential (primary) hypertension: Secondary | ICD-10-CM | POA: Diagnosis not present

## 2019-01-12 DIAGNOSIS — E440 Moderate protein-calorie malnutrition: Secondary | ICD-10-CM

## 2019-01-12 DIAGNOSIS — N401 Enlarged prostate with lower urinary tract symptoms: Secondary | ICD-10-CM

## 2019-01-12 DIAGNOSIS — J432 Centrilobular emphysema: Secondary | ICD-10-CM

## 2019-01-12 DIAGNOSIS — I739 Peripheral vascular disease, unspecified: Secondary | ICD-10-CM

## 2019-01-12 DIAGNOSIS — N138 Other obstructive and reflux uropathy: Secondary | ICD-10-CM

## 2019-01-12 DIAGNOSIS — R7309 Other abnormal glucose: Secondary | ICD-10-CM

## 2019-01-12 DIAGNOSIS — F1021 Alcohol dependence, in remission: Secondary | ICD-10-CM

## 2019-01-12 DIAGNOSIS — E871 Hypo-osmolality and hyponatremia: Secondary | ICD-10-CM

## 2019-01-12 DIAGNOSIS — Z0000 Encounter for general adult medical examination without abnormal findings: Secondary | ICD-10-CM

## 2019-01-12 MED ORDER — TAMSULOSIN HCL 0.4 MG PO CAPS: 0.4000 mg | ORAL_CAPSULE | Freq: Every day | ORAL | 3 refills | Status: AC

## 2019-01-12 MED ORDER — AMLODIPINE BESYLATE 5 MG PO TABS: 5.0000 mg | ORAL_TABLET | Freq: Every day | ORAL | 3 refills | Status: DC

## 2019-01-12 NOTE — Assessment & Plan Note (Signed)
Still some wt gain since last visit, improved appetite, up to 120 lbs Improved PO on Megace Low weight in COPD patient also with history of liver disease.  Plan Continue Megace as prescribed - counseling on potential risk side effect, variety of concerns including edema, DVT  Advised continue Ensure daily for protein supplement, improve intake if possible higher calorie/protein foods Follow-up if not improving may need referral to nutritionist in future

## 2019-01-12 NOTE — Patient Instructions (Addendum)
Keep up the good work with lifestyle and nutrition  Watch your weight, if you lose below 110 lbs again or new concerns, please don't hesitate to reach out to [place] and we can schedule a follow-up to discuss weight again.  Continue current meds, all refilled   DUE for FASTING BLOOD WORK (no food or drink after midnight before the lab appointment, only water or coffee without cream/sugar on the morning of)  SCHEDULE "Lab Only" visit in the morning at the clinic for lab draw in 1 YEAR  - Make sure Lab Only appointment is at about 1 week before your next appointment, so that results will be available  For Lab Results, once available within 2-3 days of blood draw, you can can log in to MyChart online to view your results and a brief explanation. Also, we can discuss results at next follow-up visit.   Please schedule a Follow-up Appointment to: Return in about 1 year (around 01/12/2020) for Annual Physical.  If you have any other questions or concerns, please feel free to call the office or send a message through [name]. You may also schedule an earlier appointment if necessary.  Additionally, you may be receiving a survey about your experience at our office within a few days to 1 week by e-mail or mail. We value your feedback.  Nobie Putnam, DO [place]

## 2019-01-12 NOTE — Assessment & Plan Note (Signed)
Stable, continue current regimen - no flare ups recently Smoke free since quit on chantix 2019 Continue Advair, Albuterol PRN No longer followed by [facility]

## 2019-01-12 NOTE — Assessment & Plan Note (Signed)
Stable HTN, controlled on current regimen Complicated by PAD s/p stenting in bilateral femoral arteries Former smoker now again, quit 01/2018  Plan: 1. Continue current med regimen - Metoprolol-XL 50mg , Amlodipine 5mg  - refill 2. Smoking cessation, remain smoke free 3. Monitor BP outside office 4. Follow-up 1 year

## 2019-01-12 NOTE — Assessment & Plan Note (Signed)
Stable chronic PAD s/p bilateral femoral artery stents Without worsening complications Former smoker now, quit 2019 On ASA 81mg  daily Control BP

## 2019-01-12 NOTE — Progress Notes (Signed)
Virtual Visit via Telephone The purpose of this virtual visit is to provide medical care while limiting exposure to the novel coronavirus (COVID19) for both patient and office staff.  Consent was obtained for phone visit:  Yes.   Answered questions that patient had about telehealth interaction:  Yes.   I discussed the limitations, risks, security and privacy concerns of performing an evaluation and management service by telephone. I also discussed with the patient that there may be a patient responsible charge related to this service. The patient expressed understanding and agreed to proceed.  Patient Location: Home Provider Location: [facility])  ---------------------------------------------------------------------- Chief Complaint  Patient presents with  . Hypertension    S: Reviewed CMA documentation. I have called patient and gathered additional HPI as follows:  CHRONIC HTN: Reportshe is not checking BP outside office He is doing well however by his report Current Meds - Metoprolol-XL 50mg  daily, Amlodipine 5mg (later in afternoon / evening) Reports good compliance. Tolerating well, w/o complaints. Lifestyle - limited regular exercise - No significant lifestyle changes Denies CP, dyspnea, HA, edema, dizziness / lightheadedness  Protein Calorie Malnutrition, Moderate /History of Weight Loss/ Insomnia Improved weight gain up to 120 lbs by report Improving appetite and PO intake On megace - Also admits some insomnia - difficulty staying asleep. Failed Mirtazapine 15mg  - Denies depression or anxiety  Centrilobular Emphysema (COPD) / Former Smoker / H/o PAD s/p stents in legs Remains smoke free quit 2019 His breathing is doing well, without any COPD flare up  Continues on Advair daily, and has back up emergency Albuterol - not using regularly, doing well Denies dyspnea, coughing spells, flare up, chest pain  BPH Chronic problem, LUTS. On Flomax  with good results, need refill. Last lab PSA 0.1, negative. No history of prostate CA   Patient is currently at home in isolation. He is on disability Denies any high risk travel to areas of current concern for COVID19. Denies any known or suspected exposure to person with or possibly with COVID19.  Denies any fevers, chills, sweats, body ache, cough, shortness of breath, sinus pain or pressure, headache, abdominal pain, diarrhea  Past Medical History:  Diagnosis Date  . Depression   . Emphysema of lung ([place])   . Essential hypertension   . History of chicken pox   . Low weight   . Neuropathy    bilateral lower legs  . Peripheral vascular disease ([place])   . Prostate disease   . Seasonal allergies   . Seizures ([place])   . Wears dentures    full upper   Social History   Tobacco Use  . Smoking status: Former Smoker    Packs/day: 0.50    Years: 35.00    Pack years: 17.50    Types: Cigarettes    Last attempt to quit: 01/25/2018    Years since quitting: 0.9  . Smokeless tobacco: Former Systems developer    Quit date: 08/31/2016  . Tobacco comment: Previously quit in 09/2016 on Chantix until 09/2017  Substance Use Topics  . Alcohol use: Not Currently    Alcohol/week: 0.0 standard drinks    Comment: occ  . Drug use: No    Current Outpatient Medications:  .  albuterol (PROVENTIL HFA;VENTOLIN HFA) 108 (90 Base) MCG/ACT inhaler, Inhale 2 puffs into the lungs every 6 (six) hours as needed for wheezing or shortness of breath., Disp: 1 Inhaler, Rfl: 11 .  amLODipine (NORVASC) 5 MG tablet, Take 1 tablet (5 mg total) by mouth  daily., Disp: 90 tablet, Rfl: 3 .  aspirin 81 MG tablet, Take 1 tablet (81 mg total) by mouth daily., Disp: 30 tablet, Rfl: 11 .  Fluticasone-Salmeterol (ADVAIR) 250-50 MCG/DOSE AEPB, Inhale 1 puff into the lungs 2 (two) times daily., Disp: 60 each, Rfl: 11 .  loratadine (CLARITIN) 10 MG tablet, Take 1 tablet (10 mg total) by mouth daily. Use for 4-6 weeks then stop, and use as  needed or seasonally, Disp: 90 tablet, Rfl: 3 .  megestrol (MEGACE) 40 MG tablet, Take 2-4 tablets (80-160 mg total) by mouth daily. Start with 2 tablets a day, then gradually increase as tolerated up to 4 per day, Disp: 120 tablet, Rfl: 2 .  metoprolol succinate (TOPROL-XL) 50 MG 24 hr tablet, Take 1 tablet (50 mg total) by mouth daily., Disp: 90 tablet, Rfl: 1 .  tamsulosin (FLOMAX) 0.4 MG CAPS capsule, Take 1 capsule (0.4 mg total) by mouth daily after supper., Disp: 90 capsule, Rfl: 3  Depression screen [facility] 2/9 01/12/2019 07/14/2018 04/12/2018  Decreased Interest 0 0 0  Down, Depressed, Hopeless 0 0 0  PHQ - 2 Score 0 0 0  Altered sleeping - - -  Tired, decreased energy - - -  Change in appetite - - -  Feeling bad or failure about yourself  - - -  Trouble concentrating - - -  Moving slowly or fidgety/restless - - -  Suicidal thoughts - - -  PHQ-9 Score - - -  Difficult doing work/chores - - -    No flowsheet data found.  -------------------------------------------------------------------------- O: No physical exam performed due to remote telephone encounter.  Lab results reviewed.  Recent Results (from the past 2160 hour(s))  PSA     Status: None   Collection Time: 01/05/19  8:00 AM  Result Value Ref Range   PSA 0.1 < OR = 4.0 ng/mL    Comment: The total PSA value from this assay system is  standardized against the WHO standard. The test  result will be approximately 20% lower when compared  to the equimolar-standardized total PSA (Beckman  Coulter). Comparison of serial PSA results should be  interpreted with this fact in mind. . This test was performed using the Siemens  chemiluminescent method. Values obtained from  different assay methods cannot be used interchangeably. PSA levels, regardless of value, should not be interpreted as absolute evidence of the presence or absence of disease.   Lipid panel     Status: None   Collection Time: 01/05/19  8:00 AM  Result  Value Ref Range   Cholesterol 143 <200 mg/dL   HDL 49 > OR = 40 mg/dL   Triglycerides 101 <150 mg/dL   LDL Cholesterol (Calc) 76 mg/dL (calc)    Comment: Reference range: <100 . Desirable range <100 mg/dL for primary prevention;   <70 mg/dL for patients with CHD or diabetic patients  with > or = 2 CHD risk factors. Marland Kitchen LDL-C is now calculated using the Martin-Hopkins  calculation, which is a validated novel method providing  better accuracy than the Friedewald equation in the  estimation of LDL-C.  Cresenciano Genre et al. Annamaria Helling. 2202;542(70): 2061-2068  (http://education.[facility].com/faq/FAQ164)    Total CHOL/HDL Ratio 2.9 <5.0 (calc)   Non-HDL Cholesterol (Calc) 94 <130 mg/dL (calc)    Comment: For patients with diabetes plus 1 major ASCVD risk  factor, treating to a non-HDL-C goal of <100 mg/dL  (LDL-C of <70 mg/dL) is considered a therapeutic  option.   COMPLETE METABOLIC PANEL  WITH GFR     Status: None   Collection Time: 01/05/19  8:00 AM  Result Value Ref Range   Glucose, Bld 79 65 - 99 mg/dL    Comment: .            Fasting reference interval .    BUN 11 7 - 25 mg/dL   Creat 0.93 0.70 - 1.33 mg/dL    Comment: For patients >33 years of age, the reference limit for Creatinine is approximately 13% higher for people identified as African-American. .    GFR, Est Non African American 92 > OR = 60 mL/min/1.17m2   GFR, Est African American 107 > OR = 60 mL/min/1.74m2   BUN/Creatinine Ratio NOT APPLICABLE 6 - 22 (calc)   Sodium 139 135 - 146 mmol/L   Potassium 4.6 3.5 - 5.3 mmol/L   Chloride 105 98 - 110 mmol/L   CO2 24 20 - 32 mmol/L   Calcium 10.2 8.6 - 10.3 mg/dL   Total Protein 7.3 6.1 - 8.1 g/dL   Albumin 4.1 3.6 - 5.1 g/dL   Globulin 3.2 1.9 - 3.7 g/dL (calc)   AG Ratio 1.3 1.0 - 2.5 (calc)   Total Bilirubin 0.6 0.2 - 1.2 mg/dL   Alkaline phosphatase (APISO) 55 35 - 144 U/L   AST 15 10 - 35 U/L   ALT 11 9 - 46 U/L  CBC with Differential/Platelet     Status:  Abnormal   Collection Time: 01/05/19  8:00 AM  Result Value Ref Range   WBC 8.4 3.8 - 10.8 Thousand/uL   RBC 4.42 4.20 - 5.80 Million/uL   Hemoglobin 15.4 13.2 - 17.1 g/dL   HCT 44.2 38.5 - 50.0 %   MCV 100.0 80.0 - 100.0 fL   MCH 34.8 (H) 27.0 - 33.0 pg   MCHC 34.8 32.0 - 36.0 g/dL   RDW 12.1 11.0 - 15.0 %   Platelets 285 140 - 400 Thousand/uL   MPV 9.5 7.5 - 12.5 fL   Neutro Abs 4,645 1,500 - 7,800 cells/uL   Lymphs Abs 2,369 850 - 3,900 cells/uL   Absolute Monocytes 848 200 - 950 cells/uL   Eosinophils Absolute 479 15 - 500 cells/uL   Basophils Absolute 59 0 - 200 cells/uL   Neutrophils Relative % 55.3 %   Total Lymphocyte 28.2 %   Monocytes Relative 10.1 %   Eosinophils Relative 5.7 %   Basophils Relative 0.7 %  Hemoglobin A1c     Status: None   Collection Time: 01/05/19  8:00 AM  Result Value Ref Range   Hgb A1c MFr Bld 4.9 <5.7 % of total Hgb    Comment: For the purpose of screening for the presence of diabetes: . <5.7%       Consistent with the absence of diabetes 5.7-6.4%    Consistent with increased risk for diabetes             (prediabetes) > or =6.5%  Consistent with diabetes . This assay result is consistent with a decreased risk of diabetes. . Currently, no consensus exists regarding use of hemoglobin A1c for diagnosis of diabetes in children. . According to American Diabetes Association (ADA) guidelines, hemoglobin A1c <7.0% represents optimal control in non-pregnant diabetic patients. Different metrics may apply to specific patient populations.  Standards of Medical Care in Diabetes(ADA). .    Mean Plasma Glucose 94 (calc)   eAG (mmol/L) 5.2 (calc)    -------------------------------------------------------------------------- A&P:  Problem List Items Addressed This Visit  Alcohol dependence, in remission ([place])    Remain alcohol free Sodium improved, CMET is normal, no abnormal LFTs      Benign prostatic hyperplasia    Stable, controlled  BPH LUTS on Flomax 0.4mg  Last PSA 0.1, negative, last result 1.1 in 2019 Follow-up as needed, consider finasteride if need      Relevant Medications   tamsulosin (FLOMAX) 0.4 MG CAPS capsule   Centrilobular emphysema (HCC)    Stable, continue current regimen - no flare ups recently Smoke free since quit on chantix 2019 Continue Advair, Albuterol PRN No longer followed by [facility]      Essential hypertension - Primary    Stable HTN, controlled on current regimen Complicated by PAD s/p stenting in bilateral femoral arteries Former smoker now again, quit 01/2018  Plan: 1. Continue current med regimen - Metoprolol-XL 50mg , Amlodipine 5mg  - refill 2. Smoking cessation, remain smoke free 3. Monitor BP outside office 4. Follow-up 1 year      Relevant Medications   amLODipine (NORVASC) 5 MG tablet   PAD (peripheral artery disease) (HCC)    Stable chronic PAD s/p bilateral femoral artery stents Without worsening complications Former smoker now, quit 2019 On ASA 81mg  daily Control BP      Relevant Medications   amLODipine (NORVASC) 5 MG tablet   Protein calorie malnutrition (HCC)    Still some wt gain since last visit, improved appetite, up to 120 lbs Improved PO on Megace Low weight in COPD patient also with history of liver disease.  Plan Continue Megace as prescribed - counseling on potential risk side effect, variety of concerns including edema, DVT  Advised continue Ensure daily for protein supplement, improve intake if possible higher calorie/protein foods Follow-up if not improving may need referral to nutritionist in future         Meds ordered this encounter  Medications  . amLODipine (NORVASC) 5 MG tablet    Sig: Take 1 tablet (5 mg total) by mouth daily.    Dispense:  90 tablet    Refill:  3    90 day refills added  . tamsulosin (FLOMAX) 0.4 MG CAPS capsule    Sig: Take 1 capsule (0.4 mg total) by mouth daily after supper.    Dispense:  90 capsule     Refill:  3    Refills added    Follow-up: - Return in 1 year for Annual Physical - Future labs ordered for 01/15/20  Patient verbalizes understanding with the above medical recommendations including the limitation of remote medical advice.  Specific follow-up and call-back criteria were given for patient to follow-up or seek medical care more urgently if needed.   - Time spent in direct consultation with patient on phone: 13 minutes   Nobie Putnam, [facility] 01/12/2019, 9:19 AM

## 2019-01-12 NOTE — Assessment & Plan Note (Signed)
Stable, controlled BPH LUTS on Flomax 0.4mg  Last PSA 0.1, negative, last result 1.1 in 2019 Follow-up as needed, consider finasteride if need

## 2019-01-12 NOTE — Assessment & Plan Note (Signed)
Remain alcohol free Sodium improved, CMET is normal, no abnormal LFTs

## 2019-02-15 ENCOUNTER — Other Ambulatory Visit: Payer: Self-pay | Admitting: Family Medicine

## 2019-02-15 DIAGNOSIS — J432 Centrilobular emphysema: Secondary | ICD-10-CM

## 2019-02-28 ENCOUNTER — Other Ambulatory Visit: Payer: Self-pay

## 2019-02-28 ENCOUNTER — Encounter: Payer: Self-pay | Admitting: Family Medicine

## 2019-02-28 ENCOUNTER — Ambulatory Visit (INDEPENDENT_AMBULATORY_CARE_PROVIDER_SITE_OTHER): Payer: Medicaid Other | Admitting: Family Medicine

## 2019-02-28 DIAGNOSIS — G6289 Other specified polyneuropathies: Secondary | ICD-10-CM | POA: Diagnosis not present

## 2019-02-28 DIAGNOSIS — I739 Peripheral vascular disease, unspecified: Secondary | ICD-10-CM | POA: Diagnosis not present

## 2019-02-28 NOTE — Patient Instructions (Addendum)
If not heard back from referral - gave patient phone number  [facility], [place], [place] [address]  Main: [phone]   Please schedule a Follow-up Appointment to: Return in about 1 week (around 03/07/2019), or if symptoms worsen or fail to improve, for leg pain, PAD.  If you have any other questions or concerns, please feel free to call the office or send a message through [place]. You may also schedule an earlier appointment if necessary.  Additionally, you may be receiving a survey about your experience at our office within a few days to 1 week by e-mail or mail. We value your feedback.  Nobie Putnam, DO [place]

## 2019-02-28 NOTE — Progress Notes (Signed)
Virtual Visit via Telephone The purpose of this virtual visit is to provide medical care while limiting exposure to the novel coronavirus (COVID19) for both patient and office staff.  Consent was obtained for phone visit:  Yes.   Answered questions that patient had about telehealth interaction:  Yes.   I discussed the limitations, risks, security and privacy concerns of performing an evaluation and management service by telephone. I also discussed with the patient that there may be a patient responsible charge related to this service. The patient expressed understanding and agreed to proceed.  Patient Location: Home Provider Location: [facility])  ---------------------------------------------------------------------- Chief Complaint  Patient presents with  . leg numbness    onset weeks painful ROM as per same way how it was 10 years ago had stents     S: Reviewed CMA documentation. I have called patient and gathered additional HPI as follows:  PAD, with intermittent claudication, s/p bilateral stent lower extremity Reports symptoms onset about 2-3 weeks now with significant worsening, previously had done fairly well with known history of PAD, he is former smoker, has stent in lower extremity placed by Dr Hortencia Pilar in 2009. - Now he reports worsening bilateral leg pain and numbness, worse with walking, seems episodic, some improve with rest but worsening, he describes feels similar to prior issue that required the stent placement - He is on aspirin 81mg  daily - using cane for ambulation - admits weakness due to pain - Denies any fall or injury, redness swelling, groin numbness   Past Surgical History:  Procedure Laterality Date  . CATARACT EXTRACTION W/PHACO Left 05/18/2018   Procedure: CATARACT EXTRACTION PHACO AND INTRAOCULAR LENS PLACEMENT ([place]) LEFT;  Surgeon: Leandrew Koyanagi, MD;  Location: [place];  Service: Ophthalmology;   Laterality: Left;  . COLONOSCOPY WITH PROPOFOL N/A 02/25/2016   Procedure: COLONOSCOPY WITH PROPOFOL;  Surgeon: [name], MD;  Location: [facility] ENDOSCOPY;  Service: Endoscopy;  Laterality: N/A;  . EYE SURGERY  2015   Cataract  . VASCULAR SURGERY     "7 stents in legs"    Denies any high risk travel to areas of current concern for COVID19. Denies any known or suspected exposure to person with or possibly with COVID19.  Denies any fevers, chills, sweats, body ache, cough, shortness of breath, sinus pain or pressure, headache, abdominal pain, diarrhea  Past Medical History:  Diagnosis Date  . Depression   . Emphysema of lung ([place])   . Essential hypertension   . History of chicken pox   . Low weight   . Neuropathy    bilateral lower legs  . Peripheral vascular disease ([place])   . Prostate disease   . Seasonal allergies   . Seizures ([place])   . Wears dentures    full upper   Social History   Tobacco Use  . Smoking status: Former Smoker    Packs/day: 0.50    Years: 35.00    Pack years: 17.50    Types: Cigarettes    Quit date: 01/25/2018    Years since quitting: 1.0  . Smokeless tobacco: Former Systems developer    Quit date: 08/31/2016  . Tobacco comment: Previously quit in 09/2016 on Chantix until 09/2017  Substance Use Topics  . Alcohol use: Not Currently    Alcohol/week: 0.0 standard drinks    Comment: occ  . Drug use: No    Current Outpatient Medications:  .  albuterol (VENTOLIN HFA) 108 (90 Base) MCG/ACT inhaler, Inhale 2 puffs into  the lungs every 6 (six) hours as needed for wheezing or shortness of breath., Disp: 8.5 g, Rfl: 2 .  amLODipine (NORVASC) 5 MG tablet, Take 1 tablet (5 mg total) by mouth daily. (Patient taking differently: Take 5 mg by mouth daily. ), Disp: 90 tablet, Rfl: 3 .  aspirin 81 MG tablet, Take 1 tablet (81 mg total) by mouth daily., Disp: 30 tablet, Rfl: 11 .  Fluticasone-Salmeterol (ADVAIR) 250-50 MCG/DOSE AEPB, Inhale 1 puff into the lungs 2 (two) times  daily., Disp: 60 each, Rfl: 11 .  megestrol (MEGACE) 40 MG tablet, Take 2-4 tablets (80-160 mg total) by mouth daily. Start with 2 tablets a day, then gradually increase as tolerated up to 4 per day, Disp: 120 tablet, Rfl: 0 .  metoprolol succinate (TOPROL-XL) 50 MG 24 hr tablet, Take 1 tablet (50 mg total) by mouth daily., Disp: 90 tablet, Rfl: 1 .  tamsulosin (FLOMAX) 0.4 MG CAPS capsule, Take 1 capsule (0.4 mg total) by mouth daily after supper., Disp: 90 capsule, Rfl: 3 .  loratadine (CLARITIN) 10 MG tablet, Take 1 tablet (10 mg total) by mouth daily. Use for 4-6 weeks then stop, and use as needed or seasonally (Patient not taking: Reported on 02/28/2019), Disp: 90 tablet, Rfl: 3  Depression screen [facility] 2/9 02/28/2019 01/12/2019 07/14/2018  Decreased Interest 0 0 0  Down, Depressed, Hopeless 0 0 0  PHQ - 2 Score 0 0 0  Altered sleeping - - -  Tired, decreased energy - - -  Change in appetite - - -  Feeling bad or failure about yourself  - - -  Trouble concentrating - - -  Moving slowly or fidgety/restless - - -  Suicidal thoughts - - -  PHQ-9 Score - - -  Difficult doing work/chores - - -    No flowsheet data found.  -------------------------------------------------------------------------- O: No physical exam performed due to remote telephone encounter.  Lab results reviewed.  Recent Results (from the past 2160 hour(s))  PSA     Status: None   Collection Time: 01/05/19  8:00 AM  Result Value Ref Range   PSA 0.1 < OR = 4.0 ng/mL    Comment: The total PSA value from this assay system is  standardized against the WHO standard. The test  result will be approximately 20% lower when compared  to the equimolar-standardized total PSA (Beckman  Coulter). Comparison of serial PSA results should be  interpreted with this fact in mind. . This test was performed using the Siemens  chemiluminescent method. Values obtained from  different assay methods cannot be used interchangeably. PSA  levels, regardless of value, should not be interpreted as absolute evidence of the presence or absence of disease.   Lipid panel     Status: None   Collection Time: 01/05/19  8:00 AM  Result Value Ref Range   Cholesterol 143 <200 mg/dL   HDL 49 > OR = 40 mg/dL   Triglycerides 101 <150 mg/dL   LDL Cholesterol (Calc) 76 mg/dL (calc)    Comment: Reference range: <100 . Desirable range <100 mg/dL for primary prevention;   <70 mg/dL for patients with CHD or diabetic patients  with > or = 2 CHD risk factors. Marland Kitchen LDL-C is now calculated using the Martin-Hopkins  calculation, which is a validated novel method providing  better accuracy than the Friedewald equation in the  estimation of LDL-C.  Cresenciano Genre et al. Annamaria Helling. 8546;270(35): 2061-2068  (http://education.[employer].com/faq/FAQ164)    Total CHOL/HDL Ratio 2.9 <  5.0 (calc)   Non-HDL Cholesterol (Calc) 94 <130 mg/dL (calc)    Comment: For patients with diabetes plus 1 major ASCVD risk  factor, treating to a non-HDL-C goal of <100 mg/dL  (LDL-C of <70 mg/dL) is considered a therapeutic  option.   COMPLETE METABOLIC PANEL WITH GFR     Status: None   Collection Time: 01/05/19  8:00 AM  Result Value Ref Range   Glucose, Bld 79 65 - 99 mg/dL    Comment: .            Fasting reference interval .    BUN 11 7 - 25 mg/dL   Creat 0.93 0.70 - 1.33 mg/dL    Comment: For patients >46 years of age, the reference limit for Creatinine is approximately 13% higher for people identified as African-American. .    GFR, Est Non African American 92 > OR = 60 mL/min/1.33m2   GFR, Est African American 107 > OR = 60 mL/min/1.5m2   BUN/Creatinine Ratio NOT APPLICABLE 6 - 22 (calc)   Sodium 139 135 - 146 mmol/L   Potassium 4.6 3.5 - 5.3 mmol/L   Chloride 105 98 - 110 mmol/L   CO2 24 20 - 32 mmol/L   Calcium 10.2 8.6 - 10.3 mg/dL   Total Protein 7.3 6.1 - 8.1 g/dL   Albumin 4.1 3.6 - 5.1 g/dL   Globulin 3.2 1.9 - 3.7 g/dL (calc)   AG Ratio  1.3 1.0 - 2.5 (calc)   Total Bilirubin 0.6 0.2 - 1.2 mg/dL   Alkaline phosphatase (APISO) 55 35 - 144 U/L   AST 15 10 - 35 U/L   ALT 11 9 - 46 U/L  CBC with Differential/Platelet     Status: Abnormal   Collection Time: 01/05/19  8:00 AM  Result Value Ref Range   WBC 8.4 3.8 - 10.8 Thousand/uL   RBC 4.42 4.20 - 5.80 Million/uL   Hemoglobin 15.4 13.2 - 17.1 g/dL   HCT 44.2 38.5 - 50.0 %   MCV 100.0 80.0 - 100.0 fL   MCH 34.8 (H) 27.0 - 33.0 pg   MCHC 34.8 32.0 - 36.0 g/dL   RDW 12.1 11.0 - 15.0 %   Platelets 285 140 - 400 Thousand/uL   MPV 9.5 7.5 - 12.5 fL   Neutro Abs 4,645 1,500 - 7,800 cells/uL   Lymphs Abs 2,369 850 - 3,900 cells/uL   Absolute Monocytes 848 200 - 950 cells/uL   Eosinophils Absolute 479 15 - 500 cells/uL   Basophils Absolute 59 0 - 200 cells/uL   Neutrophils Relative % 55.3 %   Total Lymphocyte 28.2 %   Monocytes Relative 10.1 %   Eosinophils Relative 5.7 %   Basophils Relative 0.7 %  Hemoglobin A1c     Status: None   Collection Time: 01/05/19  8:00 AM  Result Value Ref Range   Hgb A1c MFr Bld 4.9 <5.7 % of total Hgb    Comment: For the purpose of screening for the presence of diabetes: . <5.7%       Consistent with the absence of diabetes 5.7-6.4%    Consistent with increased risk for diabetes             (prediabetes) > or =6.5%  Consistent with diabetes . This assay result is consistent with a decreased risk of diabetes. . Currently, no consensus exists regarding use of hemoglobin A1c for diagnosis of diabetes in children. . According to American Diabetes Association (ADA) guidelines, hemoglobin A1c <7.0%  represents optimal control in non-pregnant diabetic patients. Different metrics may apply to specific patient populations.  Standards of Medical Care in Diabetes(ADA). .    Mean Plasma Glucose 94 (calc)   eAG (mmol/L) 5.2 (calc)    -------------------------------------------------------------------------- A&P:  Problem List Items  Addressed This Visit    PAD (peripheral artery disease) ([place])   Relevant Orders   Ambulatory referral to Vascular Surgery   Peripheral neuropathy   Relevant Orders   Ambulatory referral to Vascular Surgery    Other Visit Diagnoses    Claudication in peripheral vascular disease ([place])    -  Primary   Relevant Orders   Ambulatory referral to Vascular Surgery      Urgent Referral to [facility] - previously treated by Dr Hortencia Pilar 2009 for similar PAD required stent placement, now has recurrence of PAD with claudication pain and some numbness in both lower extremity, worse over past month, now significant worsening, requesting return to Vascular for consultation, diagnostic and further management options   Return criteria given if not improving, can seek care more immediately at hospital ED   Orders Placed This Encounter  Procedures  . Ambulatory referral to Vascular Surgery    Referral Priority:   Urgent    Referral Type:   Surgical    Referral Reason:   Specialty Services Required    Requested Specialty:   Vascular Surgery    Number of Visits Requested:   1     No orders of the defined types were placed in this encounter.   Follow-up: - Return within 1 week as needed if not improved, next needs vascular consultation  Patient verbalizes understanding with the above medical recommendations including the limitation of remote medical advice.  Specific follow-up and call-back criteria were given for patient to follow-up or seek medical care more urgently if needed.   - Time spent in direct consultation with patient on phone: 15 minutes  Nobie Putnam, [facility] 02/28/2019, 11:21 AM

## 2019-03-04 ENCOUNTER — Encounter: Payer: Self-pay | Admitting: Emergency Medicine

## 2019-03-04 ENCOUNTER — Inpatient Hospital Stay
Admission: EM | Admit: 2019-03-04 | Discharge: 2019-03-08 | DRG: 896 | Disposition: A | Discharge status: Home Health | Payer: Medicaid Other | Attending: Internal Medicine | Admitting: Internal Medicine

## 2019-03-04 ENCOUNTER — Emergency Department: Payer: Medicaid Other

## 2019-03-04 DIAGNOSIS — E43 Unspecified severe protein-calorie malnutrition: Secondary | ICD-10-CM | POA: Insufficient documentation

## 2019-03-04 DIAGNOSIS — Z681 Body mass index (BMI) 19 or less, adult: Secondary | ICD-10-CM | POA: Diagnosis not present

## 2019-03-04 DIAGNOSIS — Z20828 Contact with and (suspected) exposure to other viral communicable diseases: Secondary | ICD-10-CM | POA: Diagnosis present

## 2019-03-04 DIAGNOSIS — R000 Tachycardia, unspecified: Secondary | ICD-10-CM | POA: Diagnosis not present

## 2019-03-04 DIAGNOSIS — L8990 Pressure ulcer of unspecified site, unspecified stage: Secondary | ICD-10-CM | POA: Insufficient documentation

## 2019-03-04 DIAGNOSIS — Z7982 Long term (current) use of aspirin: Secondary | ICD-10-CM | POA: Diagnosis not present

## 2019-03-04 DIAGNOSIS — F329 Major depressive disorder, single episode, unspecified: Secondary | ICD-10-CM | POA: Diagnosis present

## 2019-03-04 DIAGNOSIS — N400 Enlarged prostate without lower urinary tract symptoms: Secondary | ICD-10-CM | POA: Diagnosis present

## 2019-03-04 DIAGNOSIS — E860 Dehydration: Secondary | ICD-10-CM | POA: Diagnosis present

## 2019-03-04 DIAGNOSIS — Z79899 Other long term (current) drug therapy: Secondary | ICD-10-CM | POA: Diagnosis not present

## 2019-03-04 DIAGNOSIS — Z8379 Family history of other diseases of the digestive system: Secondary | ICD-10-CM | POA: Diagnosis not present

## 2019-03-04 DIAGNOSIS — I739 Peripheral vascular disease, unspecified: Secondary | ICD-10-CM | POA: Diagnosis present

## 2019-03-04 DIAGNOSIS — Z87891 Personal history of nicotine dependence: Secondary | ICD-10-CM | POA: Diagnosis not present

## 2019-03-04 DIAGNOSIS — F10231 Alcohol dependence with withdrawal delirium: Principal | ICD-10-CM | POA: Diagnosis present

## 2019-03-04 DIAGNOSIS — E876 Hypokalemia: Secondary | ICD-10-CM | POA: Diagnosis present

## 2019-03-04 DIAGNOSIS — I10 Essential (primary) hypertension: Secondary | ICD-10-CM | POA: Diagnosis present

## 2019-03-04 DIAGNOSIS — Y903 Blood alcohol level of 60-79 mg/100 ml: Secondary | ICD-10-CM | POA: Diagnosis present

## 2019-03-04 DIAGNOSIS — Z803 Family history of malignant neoplasm of breast: Secondary | ICD-10-CM | POA: Diagnosis not present

## 2019-03-04 DIAGNOSIS — Z7951 Long term (current) use of inhaled steroids: Secondary | ICD-10-CM | POA: Diagnosis not present

## 2019-03-04 DIAGNOSIS — F10931 Alcohol use, unspecified with withdrawal delirium: Secondary | ICD-10-CM | POA: Diagnosis present

## 2019-03-04 LAB — URINE DRUG SCREEN, QUALITATIVE (ARMC ONLY)
Amphetamines, Ur Screen: NOT DETECTED
Barbiturates, Ur Screen: NOT DETECTED
Benzodiazepine, Ur Scrn: NOT DETECTED
Cannabinoid 50 Ng, Ur ~~LOC~~: NOT DETECTED
Cocaine Metabolite,Ur ~~LOC~~: NOT DETECTED
MDMA (Ecstasy)Ur Screen: NOT DETECTED
Methadone Scn, Ur: NOT DETECTED
Opiate, Ur Screen: NOT DETECTED
Phencyclidine (PCP) Ur S: NOT DETECTED
Tricyclic, Ur Screen: NOT DETECTED

## 2019-03-04 LAB — CBC WITH DIFFERENTIAL/PLATELET
Abs Immature Granulocytes: 0.02 10*3/uL (ref 0.00–0.07)
Basophils Absolute: 0.1 10*3/uL (ref 0.0–0.1)
Basophils Relative: 1 %
Eosinophils Absolute: 0.4 10*3/uL (ref 0.0–0.5)
Eosinophils Relative: 5 %
HCT: 44.1 % (ref 39.0–52.0)
Hemoglobin: 15.5 g/dL (ref 13.0–17.0)
Immature Granulocytes: 0 %
Lymphocytes Relative: 23 %
Lymphs Abs: 2 10*3/uL (ref 0.7–4.0)
MCH: 34.1 pg — ABNORMAL HIGH (ref 26.0–34.0)
MCHC: 35.1 g/dL (ref 30.0–36.0)
MCV: 97.1 fL (ref 80.0–100.0)
Monocytes Absolute: 0.7 10*3/uL (ref 0.1–1.0)
Monocytes Relative: 8 %
Neutro Abs: 5.3 10*3/uL (ref 1.7–7.7)
Neutrophils Relative %: 63 %
Platelets: 330 10*3/uL (ref 150–400)
RBC: 4.54 MIL/uL (ref 4.22–5.81)
RDW: 13.1 % (ref 11.5–15.5)
WBC: 8.5 10*3/uL (ref 4.0–10.5)
nRBC: 0 % (ref 0.0–0.2)

## 2019-03-04 LAB — BASIC METABOLIC PANEL
Anion gap: 15 (ref 5–15)
BUN: <5 mg/dL — ABNORMAL LOW (ref 6–20)
CO2: 20 mmol/L — ABNORMAL LOW (ref 22–32)
Calcium: 9 mg/dL (ref 8.9–10.3)
Chloride: 101 mmol/L (ref 98–111)
Creatinine, Ser: 0.61 mg/dL (ref 0.61–1.24)
GFR calc Af Amer: >60 mL/min (ref >60)
GFR calc non Af Amer: >60 mL/min (ref >60)
Glucose, Bld: 102 mg/dL — ABNORMAL HIGH (ref 70–99)
Potassium: 3.1 mmol/L — ABNORMAL LOW (ref 3.5–5.1)
Sodium: 136 mmol/L (ref 135–145)

## 2019-03-04 LAB — URINALYSIS, COMPLETE (UACMP) WITH MICROSCOPIC
Bacteria, UA: NONE SEEN
Bilirubin Urine: NEGATIVE
Glucose, UA: NEGATIVE mg/dL
Hgb urine dipstick: NEGATIVE
Ketones, ur: NEGATIVE mg/dL
Leukocytes,Ua: NEGATIVE
Nitrite: NEGATIVE
Protein, ur: NEGATIVE mg/dL
Specific Gravity, Urine: 1.004 — ABNORMAL LOW (ref 1.005–1.030)
Squamous Epithelial / LPF: NONE SEEN (ref 0–5)
pH: 7 (ref 5.0–8.0)

## 2019-03-04 LAB — TROPONIN I (HIGH SENSITIVITY)
Troponin I (High Sensitivity): 4 ng/L (ref <18)
Troponin I (High Sensitivity): 5 ng/L (ref <18)

## 2019-03-04 LAB — LACTIC ACID, PLASMA: Lactic Acid, Venous: 3.3 mmol/L (ref 0.5–1.9)

## 2019-03-04 LAB — ETHANOL: Alcohol, Ethyl (B): 62 mg/dL — ABNORMAL HIGH (ref <10)

## 2019-03-04 MED ORDER — SODIUM CHLORIDE 0.9 % IV BOLUS
1000.0000 mL | Freq: Once | INTRAVENOUS | Status: AC
  Administered 2019-03-04: 1000 mL via INTRAVENOUS

## 2019-03-04 MED ORDER — THIAMINE HCL 100 MG/ML IJ SOLN
Freq: Once | INTRAVENOUS | Status: AC
  Administered 2019-03-04: 22:00:00 via INTRAVENOUS
  Filled 2019-03-04: qty 1000

## 2019-03-04 MED ORDER — SODIUM CHLORIDE 0.9 % IV BOLUS: 1000.0000 mL | Freq: Once | INTRAVENOUS | Status: DC

## 2019-03-04 MED ORDER — LORAZEPAM 2 MG/ML IJ SOLN
1.0000 mg | Freq: Once | INTRAMUSCULAR | Status: AC
  Administered 2019-03-04: 1 mg via INTRAVENOUS
  Filled 2019-03-04: qty 1

## 2019-03-04 MED ORDER — METOPROLOL TARTRATE 5 MG/5ML IV SOLN
5.0000 mg | Freq: Four times a day (QID) | INTRAVENOUS | Status: DC | PRN
  Administered 2019-03-05 (×2): 5 mg via INTRAVENOUS
  Filled 2019-03-04 (×2): qty 5

## 2019-03-04 MED ORDER — CHLORDIAZEPOXIDE HCL 25 MG PO CAPS
50.0000 mg | ORAL_CAPSULE | Freq: Once | ORAL | Status: AC
  Administered 2019-03-04: 50 mg via ORAL
  Filled 2019-03-04: qty 2

## 2019-03-04 NOTE — ED Notes (Signed)
Pt has ulceration inside right groin, reports unable to open legs d/t children running around legs, pt reports children 19, 20 and 34  Pt not oriented to current events, Dr Cherylann Banas notifed

## 2019-03-04 NOTE — ED Notes (Signed)
Donald [name], NT to sitter with pt and clean bed and pt

## 2019-03-04 NOTE — ED Triage Notes (Signed)
Pt to ED via EMS from home c/o tachycardia today.  Per EMS and patient he went outside to lie on the porch because it was cooler and neighbors saw him concerned and called 911.  Patient states remembers lying down, denies pain SOB or other symptoms.  Was drinking yesterday but denies ETOH today.  Pt given approx 350cc NS and HR went from 150 to 125 en route.  Presents A&Ox4, chest rise even and unlabored, in NAD at this time.

## 2019-03-04 NOTE — ED Notes (Signed)
Pt reports drinking approx pint to 1/5 gallon of alcohol daily

## 2019-03-04 NOTE — ED Notes (Signed)
Pharmacy, Janett Billow, called for banana bag, ETA few minutes d/t making

## 2019-03-04 NOTE — ED Notes (Signed)
Pt assisted to toilet, nearly fell when not being held by the arm, pt back to bed on monitor

## 2019-03-04 NOTE — H&P (Signed)
[place] [facility] [place] NAME: Donald [name]    MR#:  [id number]  DATE OF BIRTH:  1961-04-03  DATE OF ADMISSION:  03/04/2019  PRIMARY CARE PHYSICIAN: Olin Hauser, DO   REQUESTING/REFERRING PHYSICIAN: Cherylann Banas, MD  CHIEF COMPLAINT:   Chief Complaint  Patient presents with  . Tachycardia    HISTORY OF PRESENT ILLNESS:  Donald [name]  is a 58 y.o. male who presents with chief complaint as above.  Patient was brought to the ED after EMS found him laying on his porch front of his house.  Neighbors had seen him and called EMS.  Patient is confused here in the ED, significantly tachycardic.  His work-up is largely unremarkable in terms of any sort of infection or other acute pathology.  However, he has stated that he does drink frequently, but has not had anything to drink for the last day or so.  Strong concern for withdrawal and potentially DTs.  At one point the patient was hallucinating in the ED department, seeing young children in his room.  Hospitalist called for admission  PAST MEDICAL HISTORY:   Past Medical History:  Diagnosis Date  . Depression   . Emphysema of lung ([place])   . Essential hypertension   . History of chicken pox   . Low weight   . Neuropathy    bilateral lower legs  . Peripheral vascular disease ([place])   . Prostate disease   . Seasonal allergies   . Seizures ([place])   . Wears dentures    full upper     PAST SURGICAL HISTORY:   Past Surgical History:  Procedure Laterality Date  . CATARACT EXTRACTION W/PHACO Left 05/18/2018   Procedure: CATARACT EXTRACTION PHACO AND INTRAOCULAR LENS PLACEMENT ([place]) LEFT;  Surgeon: Leandrew Koyanagi, MD;  Location: [place];  Service: Ophthalmology;  Laterality: Left;  . COLONOSCOPY WITH PROPOFOL N/A 02/25/2016   Procedure: COLONOSCOPY WITH PROPOFOL;  Surgeon: [name], MD;  Location: [facility];  Service: Endoscopy;  Laterality: N/A;  . EYE SURGERY   2015   Cataract  . VASCULAR SURGERY     "7 stents in legs"     SOCIAL HISTORY:   Social History   Tobacco Use  . Smoking status: Former Smoker    Packs/day: 0.50    Years: 35.00    Pack years: 17.50    Types: Cigarettes    Quit date: 01/25/2018    Years since quitting: 1.1  . Smokeless tobacco: Former Systems developer    Quit date: 08/31/2016  . Tobacco comment: Previously quit in 09/2016 on Chantix until 09/2017  Substance Use Topics  . Alcohol use: Not Currently    Alcohol/week: 0.0 standard drinks    Comment: occ     FAMILY HISTORY:   Family History  Problem Relation Age of Onset  . Ulcers Father        bleeding  . Cancer Mother        Breast     DRUG ALLERGIES:  No Known Allergies  MEDICATIONS AT HOME:   Prior to Admission medications   Medication Sig Start Date End Date Taking? Authorizing Provider  albuterol (VENTOLIN HFA) 108 (90 Base) MCG/ACT inhaler Inhale 2 puffs into the lungs every 6 (six) hours as needed for wheezing or shortness of breath. 02/15/19   Karamalegos, Devonne Doughty, DO  amLODipine (NORVASC) 5 MG tablet Take 1 tablet (5 mg total) by mouth daily. Patient taking differently: Take 5 mg by  mouth daily.  01/12/19   Karamalegos, Devonne Doughty, DO  aspirin 81 MG tablet Take 1 tablet (81 mg total) by mouth daily. 07/29/15   Krebs, Genevie Cheshire, NP  Fluticasone-Salmeterol (ADVAIR) 250-50 MCG/DOSE AEPB Inhale 1 puff into the lungs 2 (two) times daily. 07/06/17   Karamalegos, Devonne Doughty, DO  loratadine (CLARITIN) 10 MG tablet Take 1 tablet (10 mg total) by mouth daily. Use for 4-6 weeks then stop, and use as needed or seasonally Patient not taking: Reported on 02/28/2019 07/20/18   Olin Hauser, DO  megestrol (MEGACE) 40 MG tablet Take 2-4 tablets (80-160 mg total) by mouth daily. Start with 2 tablets a day, then gradually increase as tolerated up to 4 per day 01/12/19   Olin Hauser, DO  metoprolol succinate (TOPROL-XL) 50 MG 24 hr tablet Take 1  tablet (50 mg total) by mouth daily. 12/23/18   Karamalegos, Devonne Doughty, DO  tamsulosin (FLOMAX) 0.4 MG CAPS capsule Take 1 capsule (0.4 mg total) by mouth daily after supper. 01/12/19   Olin Hauser, DO    REVIEW OF SYSTEMS:  Review of Systems  Unable to perform ROS: Acuity of condition     VITAL SIGNS:   Vitals:   03/04/19 2130 03/04/19 2135 03/04/19 2155 03/04/19 2240  BP: (!) 156/124 (!) 151/102 (!) 151/102 (!) 153/106  Pulse:  (!) 157 (!) 157 (!) 153  Resp: (!) 25 (!) 23  (!) 21  Temp:      TempSrc:      SpO2:  98%  99%  Weight:      Height:       Wt Readings from Last 3 Encounters:  03/04/19 59 kg  01/12/19 54.7 kg  07/14/18 51.7 kg    PHYSICAL EXAMINATION:  Physical Exam  Vitals reviewed. Constitutional: He appears well-developed and well-nourished. No distress.  HENT:  Head: Normocephalic and atraumatic.  Dry mucous membranes  Eyes: Pupils are equal, round, and reactive to light. Conjunctivae and EOM are normal. No scleral icterus.  Neck: Normal range of motion. Neck supple. No JVD present. No thyromegaly present.  Cardiovascular: Regular rhythm and intact distal pulses. Exam reveals no gallop and no friction rub.  No murmur heard. Tachycardic  Respiratory: Effort normal and breath sounds normal. No respiratory distress. He has no wheezes. He has no rales.  GI: Soft. Bowel sounds are normal. He exhibits no distension. There is no abdominal tenderness.  Musculoskeletal: Normal range of motion.        General: No edema.     Comments: No arthritis, no gout  Lymphadenopathy:    He has no cervical adenopathy.  Neurological: He is alert. No cranial nerve deficit.  No dysarthria, no aphasia  Skin: Skin is warm and dry. No rash noted. No erythema.  Psychiatric:  Unable to assess due to patient condition    LABORATORY PANEL:   CBC Recent Labs  Lab 03/04/19 1852  WBC 8.5  HGB 15.5  HCT 44.1  PLT 330    ------------------------------------------------------------------------------------------------------------------  Chemistries  Recent Labs  Lab 03/04/19 1852  NA 136  K 3.1*  CL 101  CO2 20*  GLUCOSE 102*  BUN <5*  CREATININE 0.61  CALCIUM 9.0   ------------------------------------------------------------------------------------------------------------------  Cardiac Enzymes No results for input(s): TROPONINI in the last 168 hours. ------------------------------------------------------------------------------------------------------------------  RADIOLOGY:  Ct Head Wo Contrast  Result Date: 03/04/2019 CLINICAL DATA:  Altered level of consciousness, heavy alcohol intake today EXAM: CT HEAD WITHOUT CONTRAST TECHNIQUE: Contiguous axial images were obtained  from the base of the skull through the vertex without intravenous contrast. Sagittal and coronal MPR images reconstructed from axial data set. COMPARISON:  05/30/2011 FINDINGS: Brain: Generalized atrophy. Normal ventricular morphology. No midline shift or mass effect. Small vessel chronic ischemic changes of deep cerebral white matter. 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 No intracranial hemorrhage, mass lesion, evidence of acute infarction, or extra-axial fluid collection. Vascular: Atherosclerotic calcifications of internal carotid and vertebral arteries at skull base Skull: Demineralized but intact Sinuses/Orbits: Clear Other: N/A IMPRESSION: Atrophy with extensive small vessel chronic ischemic changes of deep cerebral white matter, pronounced for age. 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 No acute intracranial abnormalities. Electronically Signed   By: Lavonia Dana M.D.   On: 03/04/2019 22:26    EKG:   Orders placed or performed during the hospital encounter of 03/04/19  . EKG 12-Lead  . EKG 12-Lead  . EKG 12-Lead  . EKG 12-Lead     IMPRESSION AND PLAN:  Principal Problem:   DTs (delirium tremens) (HCC) -CIWA protocol, scheduled Librium, scheduled Ativan, IV fluids, one-to-one sitter, beta-blockers for elevated heart rate Active Problems:   Essential hypertension -home dose antihypertensives   PAD (peripheral artery disease) ([place]) -continue home meds   Benign prostatic hyperplasia -home dose Flomax  Chart review performed and case discussed with ED provider. Labs, imaging and/or ECG reviewed by provider and discussed with patient/family. Management plans discussed with the patient and/or family.  COVID-19 status: Test Pending  DVT PROPHYLAXIS: SubQ lovenox   GI PROPHYLAXIS:  None  ADMISSION STATUS: Inpatient     CODE STATUS: Full  TOTAL TIME TAKING CARE OF THIS PATIENT: 45 minutes.   This patient was evaluated in the context of the global COVID-19 pandemic, which necessitated consideration that the patient might be at risk for infection with the SARS-CoV-2 virus that causes COVID-19. Institutional protocols and algorithms that pertain to the evaluation of patients at risk for COVID-19 are in a state of rapid change based on information released by regulatory bodies including the CDC and federal and state organizations. These policies and algorithms were followed to the best of this provider's knowledge to date during the patient's care at this facility.  Ethlyn Daniels 03/04/2019, 11:52 PM  [facility]  Office  [phone]  CC: Primary care physician; Olin Hauser, DO  Note:  This document was prepared using Dragon voice recognition software and may include unintentional dictation errors.

## 2019-03-04 NOTE — ED Notes (Signed)
Pt returned to room  

## 2019-03-04 NOTE — ED Notes (Signed)
This Probation officer in to sit with pt for safety, pt hospital gown changed, linens on bed changed and pt placed back in bed, pt resting at this time, will continue to monitor for safety

## 2019-03-04 NOTE — ED Notes (Signed)
Pt asking for time "that the paperboy comes", pt oriented to no regular delivery of papers to the ED

## 2019-03-04 NOTE — ED Notes (Signed)
Patient transported to CT 

## 2019-03-04 NOTE — ED Notes (Signed)
Pt found sitting at end of bed, pt removed  IV, external catheter still in place, charge notified

## 2019-03-04 NOTE — ED Provider Notes (Signed)
[facility] Emergency Department Provider Note ____________________________________________   First MD Initiated Contact with Patient 03/04/19 1901     (approximate)  I have reviewed the triage vital signs and the nursing notes.   HISTORY  Chief Complaint Tachycardia    HPI Donald [name]. is a 58 y.o. male who presents after he was found to be significantly tachycardic by EMS after they were called out because the patient was found lying outside.  The patient states that he felt hot inside his house and decided to lie outside on the porch because it was more comfortable.  He states that his neighbors were concerned and called EMS.  On EMS arrival his heart rate was found to be around 150, but it went down with a small fluid bolus.  The patient reports drinking some alcohol yesterday, but has not had anything today and denies drinking daily.  He denies drug use.  He denies any other acute symptoms.  He has no palpitations, lightheadedness, weakness, chest pain, or fever.   Past Medical History:  Diagnosis Date  . Depression   . Emphysema of lung ([place])   . Essential hypertension   . History of chicken pox   . Low weight   . Neuropathy    bilateral lower legs  . Peripheral vascular disease ([place])   . Prostate disease   . Seasonal allergies   . Seizures ([place])   . Wears dentures    full upper    Patient Active Problem List   Diagnosis Date Noted  . DTs (delirium tremens) ([place]) 03/04/2019  . Protein calorie malnutrition ([place]) 04/12/2018  . Insomnia 04/12/2018  . Low weight 07/06/2017  . Environmental and seasonal allergies 12/29/2016  . Special screening for malignant neoplasms, colon   . Benign neoplasm of cecum   . Benign neoplasm of transverse colon   . Benign neoplasm of sigmoid colon   . Alcohol dependence, in remission ([place]) 01/20/2016  . Mononeuropathy of both lower extremities 01/20/2016  . 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 Hyponatremia  11/14/2015  . Peripheral neuropathy 07/29/2015  . Essential hypertension 06/20/2015  . Benign prostatic hyperplasia 06/20/2015  . PAD (peripheral artery disease) ([place]) 06/20/2015  . Former smoker 06/20/2015  . Centrilobular emphysema ([place]) 06/20/2015    Past Surgical History:  Procedure Laterality Date  . CATARACT EXTRACTION W/PHACO Left 05/18/2018   Procedure: CATARACT EXTRACTION PHACO AND INTRAOCULAR LENS PLACEMENT ([place]) LEFT;  Surgeon: Leandrew Koyanagi, MD;  Location: [facility];  Service: Ophthalmology;  Laterality: Left;  . COLONOSCOPY WITH PROPOFOL N/A 02/25/2016   Procedure: COLONOSCOPY WITH PROPOFOL;  Surgeon: [name], MD;  Location: [facility] ENDOSCOPY;  Service: Endoscopy;  Laterality: N/A;  . EYE SURGERY  2015   Cataract  . VASCULAR SURGERY     "7 stents in legs"    Prior to Admission medications   Medication Sig Start Date End Date Taking? Authorizing Provider  albuterol (VENTOLIN HFA) 108 (90 Base) MCG/ACT inhaler Inhale 2 puffs into the lungs every 6 (six) hours as needed for wheezing or shortness of breath. 02/15/19   Karamalegos, Devonne Doughty, DO  amLODipine (NORVASC) 5 MG tablet Take 1 tablet (5 mg total) by mouth daily. Patient taking differently: Take 5 mg by mouth daily.  01/12/19   Karamalegos, Devonne Doughty, DO  aspirin 81 MG tablet Take 1 tablet (81 mg total) by mouth daily. 07/29/15   Luciana Axe, NP  Fluticasone-Salmeterol (ADVAIR) 250-50 MCG/DOSE AEPB Inhale 1 puff into the  lungs 2 (two) times daily. 07/06/17   Karamalegos, Devonne Doughty, DO  loratadine (CLARITIN) 10 MG tablet Take 1 tablet (10 mg total) by mouth daily. Use for 4-6 weeks then stop, and use as needed or seasonally Patient not taking: Reported on 02/28/2019 07/20/18   Olin Hauser, DO  megestrol (MEGACE) 40 MG tablet Take 2-4 tablets (80-160 mg total) by mouth daily. Start with 2 tablets a day, then gradually increase as tolerated up to 4 per day 01/12/19   Olin Hauser, DO  metoprolol succinate (TOPROL-XL) 50 MG 24 hr tablet Take 1 tablet (50 mg total) by mouth daily. 12/23/18   Karamalegos, Devonne Doughty, DO  tamsulosin (FLOMAX) 0.4 MG CAPS capsule Take 1 capsule (0.4 mg total) by mouth daily after supper. 01/12/19   Olin Hauser, DO    Allergies Patient has no known allergies.  Family History  Problem Relation Age of Onset  . Ulcers Father        bleeding  . Cancer Mother        Breast    Social History Social History   Tobacco Use  . Smoking status: Former Smoker    Packs/day: 0.50    Years: 35.00    Pack years: 17.50    Types: Cigarettes    Quit date: 01/25/2018    Years since quitting: 1.1  . Smokeless tobacco: Former Systems developer    Quit date: 08/31/2016  . Tobacco comment: Previously quit in 09/2016 on Chantix until 09/2017  Substance Use Topics  . Alcohol use: Not Currently    Alcohol/week: 0.0 standard drinks    Comment: occ  . Drug use: No    Review of Systems  Constitutional: No fever/chills. Eyes: No redness. ENT: No sore throat. Cardiovascular: Denies chest pain. Respiratory: Denies shortness of breath. Gastrointestinal: No vomiting or diarrhea.  Genitourinary: Negative for dysuria.  Musculoskeletal: Negative for back pain. Skin: Negative for rash. Neurological: Negative for headache.   ____________________________________________   PHYSICAL EXAM:  VITAL SIGNS: ED Triage Vitals  Enc Vitals Group     BP 03/04/19 1852 (!) 138/95     Pulse Rate 03/04/19 1852 (!) 128     Resp 03/04/19 1852 (!) 22     Temp 03/04/19 1852 98.2 F (36.8 C)     Temp Source 03/04/19 1852 Oral     SpO2 03/04/19 1852 99 %     Weight 03/04/19 1853 130 lb (59 kg)     Height 03/04/19 1853 5\' 8"  (1.727 m)     Head Circumference --      Peak Flow --      Pain Score 03/04/19 1853 0     Pain Loc --      Pain Edu? --      Excl. in [place]? --     Constitutional: Alert and oriented.  Somewhat frail-appearing for his age but in no acute  distress. Eyes: Conjunctivae are normal.  EOMI.  PERRLA Head: Atraumatic. Nose: No congestion/rhinnorhea. Mouth/Throat: Mucous membranes are dry.   Neck: Normal range of motion.  Cardiovascular: Tachycardic, regular rhythm. Grossly normal heart sounds.  Good peripheral circulation. Respiratory: Normal respiratory effort.  No retractions. Lungs CTAB. Gastrointestinal: Soft and nontender. No distention.  Genitourinary: No flank tenderness. Musculoskeletal: No lower extremity edema.  Extremities warm and well perfused.  Neurologic:  Normal speech and language. No gross focal neurologic deficits are appreciated.  No tongue fasciculation.  Mild peripheral tremor. Skin:  Skin is warm and dry. No rash  noted. Psychiatric: Calm and cooperative.  ____________________________________________   LABS (all labs ordered are listed, but only abnormal results are displayed)  Labs Reviewed  BASIC METABOLIC PANEL - Abnormal; Notable for the following components:      Result Value   Potassium 3.1 (*)    CO2 20 (*)    Glucose, Bld 102 (*)    BUN <5 (*)    All other components within normal limits  CBC WITH DIFFERENTIAL/PLATELET - Abnormal; Notable for the following components:   MCH 34.1 (*)    All other components within normal limits  URINALYSIS, COMPLETE (UACMP) WITH MICROSCOPIC - Abnormal; Notable for the following components:   Color, Urine YELLOW (*)    APPearance CLEAR (*)    Specific Gravity, Urine 1.004 (*)    All other components within normal limits  LACTIC ACID, PLASMA - Abnormal; Notable for the following components:   Lactic Acid, Venous 3.3 (*)    All other components within normal limits  ETHANOL - Abnormal; Notable for the following components:   Alcohol, Ethyl (B) 62 (*)    All other components within normal limits  NOVEL CORONAVIRUS, NAA (HOSPITAL ORDER, SEND-OUT TO REF LAB)  TROPONIN I (HIGH SENSITIVITY)  TROPONIN I (HIGH SENSITIVITY)  URINE DRUG SCREEN, QUALITATIVE ([facility]  ONLY)  LACTIC ACID, PLASMA   ____________________________________________  EKG  ED ECG REPORT I, Arta Silence, the attending physician, personally viewed and interpreted this ECG.  Date: 03/04/2019 EKG Time: 1851 Rate: 128 Rhythm: Sinus tachycardia QRS Axis: normal Intervals: normal ST/T Wave abnormalities: normal Narrative Interpretation: no evidence of acute ischemia  ____________________________________________  RADIOLOGY    ____________________________________________   PROCEDURES  Procedure(s) performed: No  Procedures  Critical Care performed: Yes  CRITICAL CARE Performed by: Arta Silence   Total critical care time: 45 minutes  Critical care time was exclusive of separately billable procedures and treating other patients.  Critical care was necessary to treat or prevent imminent or life-threatening deterioration.  Critical care was time spent personally by me on the following activities: development of treatment plan with patient and/or surrogate as well as nursing, discussions with consultants, evaluation of patient's response to treatment, examination of patient, obtaining history from patient or surrogate, ordering and performing treatments and interventions, ordering and review of laboratory studies, ordering and review of radiographic studies, pulse oximetry and re-evaluation of patient's condition. ____________________________________________   INITIAL IMPRESSION / ASSESSMENT AND PLAN / ED COURSE  Pertinent labs & imaging results that were available during my care of the patient were reviewed by me and considered in my medical decision making (see chart for details).  58 year old male with PMH as noted above presents with tachycardia after neighbors called because they were concerned that he was lying outside on his porch.  The patient denies any acute symptoms and states he was simply lying outside because it was more comfortable.  He  reports drinking last night but denies drinking daily or any history of alcohol withdrawal, and denies drug use.  On exam the patient is somewhat frail-appearing for his age.  His heart rate is in the 130s.  Other vital signs are normal except for slightly elevated blood pressure.  He has dry mucous membranes.  The remainder of the exam is unremarkable for acute findings.  EKG shows sinus tachycardia with no ischemic changes.  I reviewed the past medical records in [place].  The patient has no recent prior ED visits or admissions here.  Given that the patient denies any symptoms,  the etiology of his tachycardia is unclear.  I overall suspect most likely dehydration/hypovolemia, possibly exacerbated by alcohol use.  Other than the patient appearing mildly tremulous he has no signs of active alcohol withdrawal.  There is no evidence of heatstroke given that he does not feel particularly tired or weak.  He is not hyperthermic.  The patient has no documented history of drug use.  We will give IV fluids, obtain basic labs, and reassess.  ----------------------------------------- 12:11 AM on 03/05/2019 -----------------------------------------  On further history the patient does admit to frequent drinking.  I ordered a dose of Ativan for possible alcohol withdrawal although the patient had no immediate response.  Talking to him further, he did start to appear slightly delirious.  I ordered a CT head which was negative for acute findings.  Lab work-up was unremarkable except for elevated alcohol level and elevated lactic acid.  Overall this favors alcohol withdrawal and dehydration is the most likely etiology, rather than sepsis.  Of note, on further examination the patient has a few superficial appearing sores to the skin in his right groin.  It appears that the patient has been chronically incontinent and I suspect this is the most likely etiology.  There is no evidence of cellulitis.  Given the persistent  tachycardia and abnormal labs, the patient will require admission.  I signed him out to the hospitalist Dr. Jannifer Franklin.  ________________________________  [name]. was evaluated in Emergency Department on 03/05/2019 for the symptoms described in the history of present illness. He was evaluated in the context of the global COVID-19 pandemic, which necessitated consideration that the patient might be at risk for infection with the SARS-CoV-2 virus that causes COVID-19. Institutional protocols and algorithms that pertain to the evaluation of patients at risk for COVID-19 are in a state of rapid change based on information released by regulatory bodies including the CDC and federal and state organizations. These policies and algorithms were followed during the patient's care in the ED. ____________________________________________   FINAL CLINICAL IMPRESSION(S) / ED DIAGNOSES  Final diagnoses:  Tachycardia  Alcohol withdrawal syndrome, with delirium ([place])  Dehydration      NEW MEDICATIONS STARTED DURING THIS VISIT:  New Prescriptions   No medications on file     Note:  This document was prepared using Dragon voice recognition software and may include unintentional dictation errors.    Arta Silence, MD 03/05/19 [phone]

## 2019-03-04 NOTE — ED Notes (Signed)
Near fall reported to charge, pt on bedpan

## 2019-03-04 NOTE — ED Notes (Signed)
Pt reports recent hx of bedbugs,

## 2019-03-04 NOTE — ED Notes (Addendum)
CRITICAL LAB: LACTIC is 3.3, jackie, Lab, Dr. Cherylann Banas notified, orders received

## 2019-03-05 DIAGNOSIS — L8990 Pressure ulcer of unspecified site, unspecified stage: Secondary | ICD-10-CM | POA: Insufficient documentation

## 2019-03-05 LAB — BASIC METABOLIC PANEL
Anion gap: 14 (ref 5–15)
BUN: <5 mg/dL — ABNORMAL LOW (ref 6–20)
CO2: 21 mmol/L — ABNORMAL LOW (ref 22–32)
Calcium: 8.5 mg/dL — ABNORMAL LOW (ref 8.9–10.3)
Chloride: 103 mmol/L (ref 98–111)
Creatinine, Ser: 0.44 mg/dL — ABNORMAL LOW (ref 0.61–1.24)
GFR calc Af Amer: >60 mL/min (ref >60)
GFR calc non Af Amer: >60 mL/min (ref >60)
Glucose, Bld: 107 mg/dL — ABNORMAL HIGH (ref 70–99)
Potassium: 2.7 mmol/L — CL (ref 3.5–5.1)
Sodium: 138 mmol/L (ref 135–145)

## 2019-03-05 LAB — LACTIC ACID, PLASMA: Lactic Acid, Venous: 1.7 mmol/L (ref 0.5–1.9)

## 2019-03-05 LAB — CBC
HCT: 43.6 % (ref 39.0–52.0)
Hemoglobin: 15.3 g/dL (ref 13.0–17.0)
MCH: 33.6 pg (ref 26.0–34.0)
MCHC: 35.1 g/dL (ref 30.0–36.0)
MCV: 95.6 fL (ref 80.0–100.0)
Platelets: 332 10*3/uL (ref 150–400)
RBC: 4.56 MIL/uL (ref 4.22–5.81)
RDW: 13.2 % (ref 11.5–15.5)
WBC: 10.7 10*3/uL — ABNORMAL HIGH (ref 4.0–10.5)
nRBC: 0 % (ref 0.0–0.2)

## 2019-03-05 LAB — POTASSIUM: Potassium: 3.4 mmol/L — ABNORMAL LOW (ref 3.5–5.1)

## 2019-03-05 LAB — SARS CORONAVIRUS 2 BY RT PCR (HOSPITAL ORDER, PERFORMED IN ~~LOC~~ HOSPITAL LAB): SARS Coronavirus 2: NEGATIVE

## 2019-03-05 LAB — MAGNESIUM: Magnesium: 1.3 mg/dL — ABNORMAL LOW (ref 1.7–2.4)

## 2019-03-05 MED ORDER — ACETAMINOPHEN 650 MG RE SUPP: 650.0000 mg | Freq: Four times a day (QID) | RECTAL | Status: DC | PRN

## 2019-03-05 MED ORDER — ONDANSETRON HCL 4 MG PO TABS: 4.0000 mg | ORAL_TABLET | Freq: Four times a day (QID) | ORAL | Status: DC | PRN

## 2019-03-05 MED ORDER — AMLODIPINE BESYLATE 5 MG PO TABS
5.0000 mg | ORAL_TABLET | Freq: Every day | ORAL | Status: DC
  Administered 2019-03-05 – 2019-03-07 (×3): 5 mg via ORAL
  Filled 2019-03-05 (×3): qty 1

## 2019-03-05 MED ORDER — MOMETASONE FURO-FORMOTEROL FUM 200-5 MCG/ACT IN AERO
2.0000 | INHALATION_SPRAY | Freq: Two times a day (BID) | RESPIRATORY_TRACT | Status: DC
  Administered 2019-03-05 – 2019-03-08 (×6): 2 via RESPIRATORY_TRACT
  Filled 2019-03-05: qty 8.8

## 2019-03-05 MED ORDER — FOLIC ACID 1 MG PO TABS
1.0000 mg | ORAL_TABLET | Freq: Every day | ORAL | Status: DC
  Administered 2019-03-05 – 2019-03-08 (×4): 1 mg via ORAL
  Filled 2019-03-05 (×4): qty 1

## 2019-03-05 MED ORDER — TAMSULOSIN HCL 0.4 MG PO CAPS
0.4000 mg | ORAL_CAPSULE | Freq: Every day | ORAL | Status: DC
  Administered 2019-03-05 – 2019-03-07 (×3): 0.4 mg via ORAL
  Filled 2019-03-05 (×3): qty 1

## 2019-03-05 MED ORDER — LORAZEPAM 1 MG PO TABS
1.0000 mg | ORAL_TABLET | Freq: Four times a day (QID) | ORAL | Status: AC | PRN
  Administered 2019-03-05 – 2019-03-07 (×3): 1 mg via ORAL
  Filled 2019-03-05 (×3): qty 1

## 2019-03-05 MED ORDER — LORAZEPAM 2 MG/ML IJ SOLN
0.0000 mg | Freq: Four times a day (QID) | INTRAMUSCULAR | Status: AC
  Administered 2019-03-05: 2 mg via INTRAVENOUS
  Filled 2019-03-05: qty 1

## 2019-03-05 MED ORDER — METOPROLOL SUCCINATE ER 50 MG PO TB24
50.0000 mg | ORAL_TABLET | Freq: Every day | ORAL | Status: DC
  Administered 2019-03-05 – 2019-03-08 (×4): 50 mg via ORAL
  Filled 2019-03-05 (×4): qty 1

## 2019-03-05 MED ORDER — ADULT MULTIVITAMIN W/MINERALS CH
1.0000 | ORAL_TABLET | Freq: Every day | ORAL | Status: DC
  Administered 2019-03-05 – 2019-03-08 (×4): 1 via ORAL
  Filled 2019-03-05 (×4): qty 1

## 2019-03-05 MED ORDER — POTASSIUM CHLORIDE CRYS ER 20 MEQ PO TBCR
40.0000 meq | EXTENDED_RELEASE_TABLET | ORAL | Status: AC
  Administered 2019-03-05 (×2): 40 meq via ORAL
  Filled 2019-03-05 (×2): qty 2

## 2019-03-05 MED ORDER — ACETAMINOPHEN 325 MG PO TABS
650.0000 mg | ORAL_TABLET | Freq: Four times a day (QID) | ORAL | Status: DC | PRN
  Administered 2019-03-05 – 2019-03-08 (×7): 650 mg via ORAL
  Filled 2019-03-05 (×7): qty 2

## 2019-03-05 MED ORDER — CHLORDIAZEPOXIDE HCL 25 MG PO CAPS
25.0000 mg | ORAL_CAPSULE | Freq: Four times a day (QID) | ORAL | Status: DC
  Administered 2019-03-05 – 2019-03-07 (×10): 25 mg via ORAL
  Filled 2019-03-05: qty 1
  Filled 2019-03-05 (×2): qty 5
  Filled 2019-03-05: qty 1
  Filled 2019-03-05 (×6): qty 5

## 2019-03-05 MED ORDER — ASPIRIN EC 81 MG PO TBEC
81.0000 mg | DELAYED_RELEASE_TABLET | Freq: Every day | ORAL | Status: DC
  Administered 2019-03-05 – 2019-03-08 (×4): 81 mg via ORAL
  Filled 2019-03-05 (×4): qty 1

## 2019-03-05 MED ORDER — ENOXAPARIN SODIUM 40 MG/0.4ML ~~LOC~~ SOLN
40.0000 mg | SUBCUTANEOUS | Status: DC
  Administered 2019-03-07: 09:00:00 40 mg via SUBCUTANEOUS
  Filled 2019-03-05 (×2): qty 0.4

## 2019-03-05 MED ORDER — CLONIDINE HCL 0.1 MG PO TABS
0.1000 mg | ORAL_TABLET | Freq: Two times a day (BID) | ORAL | Status: DC
  Administered 2019-03-05 – 2019-03-08 (×7): 0.1 mg via ORAL
  Filled 2019-03-05 (×7): qty 1

## 2019-03-05 MED ORDER — ONDANSETRON HCL 4 MG/2ML IJ SOLN: 4.0000 mg | Freq: Four times a day (QID) | INTRAMUSCULAR | Status: DC | PRN

## 2019-03-05 MED ORDER — THIAMINE HCL 100 MG/ML IJ SOLN: 100.0000 mg | Freq: Every day | INTRAMUSCULAR | Status: DC

## 2019-03-05 MED ORDER — LORAZEPAM 2 MG/ML IJ SOLN
0.0000 mg | Freq: Two times a day (BID) | INTRAMUSCULAR | Status: DC
  Administered 2019-03-07: 2 mg via INTRAVENOUS
  Filled 2019-03-05: qty 1

## 2019-03-05 MED ORDER — SODIUM CHLORIDE 0.9 % IV SOLN
INTRAVENOUS | Status: DC
  Administered 2019-03-05 – 2019-03-07 (×6): via INTRAVENOUS

## 2019-03-05 MED ORDER — LORAZEPAM 2 MG/ML IJ SOLN
1.0000 mg | Freq: Four times a day (QID) | INTRAMUSCULAR | Status: AC | PRN
  Administered 2019-03-05: 09:00:00 1 mg via INTRAVENOUS
  Filled 2019-03-05: qty 1

## 2019-03-05 MED ORDER — VITAMIN B-1 100 MG PO TABS
100.0000 mg | ORAL_TABLET | Freq: Every day | ORAL | Status: DC
  Administered 2019-03-05 – 2019-03-08 (×4): 100 mg via ORAL
  Filled 2019-03-05 (×4): qty 1

## 2019-03-05 NOTE — ED Notes (Signed)
Report call, primary to call back

## 2019-03-05 NOTE — Progress Notes (Signed)
Donald [name] at [place] NAME: Donald [name]    MR#:  [id number]  DATE OF BIRTH:  July 01, 1961  SUBJECTIVE:  CHIEF COMPLAINT:   Chief Complaint  Patient presents with  . Tachycardia   No new complaint this morning.  Patient confused and slightly restless in bed but not agitated.  Admitted last night this with diagnosis of delirium tremens REVIEW OF SYSTEMS:  ROS patient pleasantly confused.  Unobtainable.  DRUG ALLERGIES:  No Known Allergies VITALS:  Blood pressure (!) 164/97, pulse (!) 127, temperature 98.4 F (36.9 C), temperature source Oral, resp. rate 20, height 5\' 9"  (1.753 m), weight 49.2 kg, SpO2 100 %. PHYSICAL EXAMINATION:   Physical Exam  Constitutional: He appears well-developed and well-nourished.  HENT:  Head: Normocephalic and atraumatic.  Right Ear: External ear normal.  Eyes: Pupils are equal, round, and reactive to light. Conjunctivae are normal. Right eye exhibits no discharge.  Neck: Normal range of motion. Neck supple. No thyromegaly present.  Cardiovascular: Normal rate, regular rhythm and normal heart sounds.  Respiratory: Effort normal and breath sounds normal. No respiratory distress.  GI: Soft. Bowel sounds are normal. He exhibits no distension.  Musculoskeletal: Normal range of motion.        General: No edema.  Neurological: He is alert.  Pleasantly confused.  Moving all extremities with no focal deficit.  Skin: Skin is warm. He is not diaphoretic. No erythema.  Psychiatric:  Pleasantly confused.   LABORATORY PANEL:  Male CBC Recent Labs  Lab 03/05/19 0407  WBC 10.7*  HGB 15.3  HCT 43.6  PLT 332   ------------------------------------------------------------------------------------------------------------------ Chemistries  Recent Labs  Lab 03/05/19 0407  NA 138  K 2.7*  CL 103  CO2 21*  GLUCOSE 107*  BUN <5*  CREATININE 0.44*  CALCIUM 8.5*   RADIOLOGY:  Ct Head Wo Contrast  Result  Date: 03/04/2019 CLINICAL DATA:  Altered level of consciousness, heavy alcohol intake today EXAM: CT HEAD WITHOUT CONTRAST TECHNIQUE: Contiguous axial images were obtained from the base of the skull through the vertex without intravenous contrast. Sagittal and coronal MPR images reconstructed from axial data set. COMPARISON:  05/30/2011 FINDINGS: Brain: Generalized atrophy. Normal ventricular morphology. No midline shift or mass effect. Small vessel chronic ischemic changes of deep cerebral white matter. 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 No intracranial hemorrhage, mass lesion, evidence of acute infarction, or extra-axial fluid collection. Vascular: Atherosclerotic calcifications of internal carotid and vertebral arteries at skull base Skull: Demineralized but intact Sinuses/Orbits: Clear Other: N/A IMPRESSION: Atrophy with extensive small vessel chronic ischemic changes of deep cerebral white matter, pronounced for age. 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 No acute intracranial abnormalities. Electronically Signed   By: Lavonia Dana M.D.   On: 03/04/2019 22:26   ASSESSMENT AND PLAN:    1. DTs (delirium tremens) ([place]) - CIWA protocol, scheduled Librium, scheduled Ativan, IV fluids, one-to-one sitter, beta-blockers for elevated heart rate Urine drug screen negative. Alcohol level was 62 on admission.  2. Essential hypertension -home dose antihypertensives. Added clonidine which will also help with alcohol withdrawal.  3.  PAD (peripheral artery disease) ([place]) -continue home meds  4. Benign prostatic hyperplasia -home dose Flomax  5.  Hypokalemia; replaced.  Follow-up on repeat levels  DVT prophylaxis; Lovenox   All the records are reviewed and case discussed with Care Management/Social Worker. Management plans discussed with the patient, family and they  are in agreement.  CODE STATUS: Full Code   TOTAL TIME TAKING CARE OF THIS PATIENT: 36 minutes.   More than 50% of the time was spent in counseling/coordination of care: YES  POSSIBLE D/C IN 2 DAYS, DEPENDING ON CLINICAL CONDITION.   Kerem Gilmer M.D on 03/05/2019 at 11:59 AM  Between 7am to 6pm - Pager - [phone]  After 6pm go to [email or web address] - Proofreader  [facility]  Office  [phone]  CC: Primary care physician; Olin Hauser, DO  Note: This dictation was prepared with Dragon dictation along with smaller phrase technology. Any transcriptional errors that result from this process are unintentional.

## 2019-03-06 ENCOUNTER — Other Ambulatory Visit: Payer: Self-pay

## 2019-03-06 ENCOUNTER — Encounter (INDEPENDENT_AMBULATORY_CARE_PROVIDER_SITE_OTHER): Payer: Self-pay | Admitting: Vascular Surgery

## 2019-03-06 DIAGNOSIS — E43 Unspecified severe protein-calorie malnutrition: Secondary | ICD-10-CM | POA: Insufficient documentation

## 2019-03-06 LAB — CBC
HCT: 41 % (ref 39.0–52.0)
Hemoglobin: 14 g/dL (ref 13.0–17.0)
MCH: 34.3 pg — ABNORMAL HIGH (ref 26.0–34.0)
MCHC: 34.1 g/dL (ref 30.0–36.0)
MCV: 100.5 fL — ABNORMAL HIGH (ref 80.0–100.0)
Platelets: 284 10*3/uL (ref 150–400)
RBC: 4.08 MIL/uL — ABNORMAL LOW (ref 4.22–5.81)
RDW: 13.4 % (ref 11.5–15.5)
WBC: 8.4 10*3/uL (ref 4.0–10.5)
nRBC: 0 % (ref 0.0–0.2)

## 2019-03-06 LAB — BASIC METABOLIC PANEL
Anion gap: 9 (ref 5–15)
BUN: 7 mg/dL (ref 6–20)
CO2: 22 mmol/L (ref 22–32)
Calcium: 8.9 mg/dL (ref 8.9–10.3)
Chloride: 108 mmol/L (ref 98–111)
Creatinine, Ser: 0.52 mg/dL — ABNORMAL LOW (ref 0.61–1.24)
GFR calc Af Amer: >60 mL/min (ref >60)
GFR calc non Af Amer: >60 mL/min (ref >60)
Glucose, Bld: 94 mg/dL (ref 70–99)
Potassium: 3.2 mmol/L — ABNORMAL LOW (ref 3.5–5.1)
Sodium: 139 mmol/L (ref 135–145)

## 2019-03-06 LAB — PHOSPHORUS: Phosphorus: 2.3 mg/dL — ABNORMAL LOW (ref 2.5–4.6)

## 2019-03-06 LAB — MAGNESIUM: Magnesium: 1.5 mg/dL — ABNORMAL LOW (ref 1.7–2.4)

## 2019-03-06 MED ORDER — MAGNESIUM SULFATE 2 GM/50ML IV SOLN
2.0000 g | Freq: Once | INTRAVENOUS | Status: AC
  Administered 2019-03-06: 2 g via INTRAVENOUS
  Filled 2019-03-06: qty 50

## 2019-03-06 MED ORDER — ENSURE ENLIVE PO LIQD
237.0000 mL | Freq: Two times a day (BID) | ORAL | Status: DC
  Administered 2019-03-06 – 2019-03-08 (×3): 237 mL via ORAL

## 2019-03-06 MED ORDER — POTASSIUM PHOSPHATE MONOBASIC 500 MG PO TABS
1000.0000 mg | ORAL_TABLET | Freq: Once | ORAL | Status: AC
  Administered 2019-03-06: 1000 mg via ORAL
  Filled 2019-03-06: qty 2

## 2019-03-06 MED ORDER — POTASSIUM CHLORIDE CRYS ER 20 MEQ PO TBCR
40.0000 meq | EXTENDED_RELEASE_TABLET | Freq: Once | ORAL | Status: AC
  Administered 2019-03-06: 40 meq via ORAL
  Filled 2019-03-06: qty 2

## 2019-03-06 NOTE — Progress Notes (Signed)
[place] at [place] NAME: Donald [name]    MR#:  [id number]  DATE OF BIRTH:  07-01-1961  SUBJECTIVE:  CHIEF COMPLAINT:   Chief Complaint  Patient presents with  . Tachycardia   No new complaint this morning.  No agitation reported overnight.  Resting comfortably.  Less confused.  REVIEW OF SYSTEMS:  Review of Systems  Constitutional: Negative for chills and fever.  HENT: Negative for hearing loss and tinnitus.   Eyes: Negative for blurred vision and double vision.  Respiratory: Negative for cough and hemoptysis.   Cardiovascular: Negative for chest pain and palpitations.  Gastrointestinal: Negative for heartburn and nausea.  Genitourinary: Negative for dysuria and urgency.  Musculoskeletal: Negative for myalgias and neck pain.  Skin: Negative for itching and rash.  Neurological: Negative for dizziness.  Psychiatric/Behavioral: Positive for substance abuse. Negative for depression and hallucinations.      DRUG ALLERGIES:  No Known Allergies VITALS:  Blood pressure (!) 144/95, pulse (!) 101, temperature (!) 97.4 F (36.3 C), temperature source Oral, resp. rate 16, height 5\' 9"  (1.753 m), weight 49.2 kg, SpO2 100 %. PHYSICAL EXAMINATION:   Physical Exam  Constitutional: He is oriented to person, place, and time. He appears well-developed and well-nourished.  HENT:  Head: Normocephalic and atraumatic.  Right Ear: External ear normal.  Eyes: Pupils are equal, round, and reactive to light. Conjunctivae are normal. Right eye exhibits no discharge.  Neck: Normal range of motion. Neck supple. No thyromegaly present.  Cardiovascular: Normal rate, regular rhythm and normal heart sounds.  Respiratory: Effort normal and breath sounds normal. No respiratory distress.  GI: Soft. Bowel sounds are normal. He exhibits no distension.  Musculoskeletal: Normal range of motion.        General: No edema.  Neurological: He is alert and oriented to  person, place, and time.  Skin: Skin is warm. He is not diaphoretic. No erythema.  Psychiatric: He has a normal mood and affect.   LABORATORY PANEL:  Male CBC Recent Labs  Lab 03/06/19 0411  WBC 8.4  HGB 14.0  HCT 41.0  PLT 284   ------------------------------------------------------------------------------------------------------------------ Chemistries  Recent Labs  Lab 03/06/19 0411  NA 139  K 3.2*  CL 108  CO2 22  GLUCOSE 94  BUN 7  CREATININE 0.52*  CALCIUM 8.9  MG 1.5*   RADIOLOGY:  No results found. ASSESSMENT AND PLAN:    1. DTs (delirium tremens) ([place]) - CIWA protocol, scheduled Librium, scheduled Ativan, IV fluids,  beta-blockers for elevated heart rate Urine drug screen negative. Alcohol level was 62 on admission. Patient improving clinically.  No agitation.  Confusion significantly improved.  2. Essential hypertension -home dose antihypertensives. Added clonidine which will also help with alcohol withdrawal.  3.  PAD (peripheral artery disease) ([place]) -continue home meds  4. Benign prostatic hyperplasia -home dose Flomax  5.  Hypokalemia; replaced.  Follow-up on repeat levels  DVT prophylaxis; Lovenox   All the records are reviewed and case discussed with Care Management/Social Worker. Management plans discussed with the patient, family and they are in agreement.  CODE STATUS: Full Code  TOTAL TIME TAKING CARE OF THIS PATIENT: 35 minutes.   More than 50% of the time was spent in counseling/coordination of care: YES  POSSIBLE D/C IN 2 DAYS, DEPENDING ON CLINICAL CONDITION.   Donald [name] M.D on 03/06/2019 at 1:58 PM  Between 7am to 6pm - Pager - [phone]  After 6pm go to [email or web address] - password  [email or web address]  [facility] [place] [facility]  Office  [phone]  CC: Primary care physician; Donald Hauser, DO  Note: This dictation was prepared with Dragon dictation along with smaller phrase technology. Any  transcriptional errors that result from this process are unintentional.

## 2019-03-06 NOTE — Progress Notes (Signed)
Initial Nutrition Assessment  RD working remotely.  DOCUMENTATION CODES:   Severe malnutrition in context of chronic illness, Underweight  INTERVENTION:  Recommend liberalizing diet to regular.  Provide Ensure Enlive po BID, each supplement provides 350 kcal and 20 grams of protein.  Continue MVI daily, thiamine 629 mg daily, folic acid 1 mg daily.  Continue monitoring potassium, magnesium, and phosphorus and replacing as needed as patient is at risk for refeeding syndrome.  NUTRITION DIAGNOSIS:   Severe Malnutrition related to chronic illness(emphysema) as evidenced by severe fat depletion, severe muscle depletion.  GOAL:   Patient will meet greater than or equal to 90% of their needs  MONITOR:   PO intake, Supplement acceptance, Labs, Weight trends, Skin, I & O's  REASON FOR ASSESSMENT:   Other (Comment)(Low BMI)    ASSESSMENT:   58 year old male with PMHx of HTN, depression, seizures, emphysema of lung, PVD, neuropathy admitted with DTs.   Met with patient at bedside this morning. He reported he has had a decreased appetite for the past 2-3 weeks. He reports it is related to pain in his legs. He reports he is still attempting to eat 2-3 meals per day but is eating less at meals. He reports he is eating about 25% of his meals here. He is amenable to drinking ONS to help meet calorie/protein needs.  Patient reports his UBW is 125 lbs and that he has lost down to 110 lbs over the past few weeks. Per chart he was 54.7 kg on 01/12/2019. He is now 49.2 kg (108.47 lbs). He has lost 5.5 kg (10.1% body weight) over the past almost 2 months, which is significant for time frame.  Medications reviewed and include: folic acid 1 mg daily, MVI daily, Flomax, thiamine 100 mg daily, NS at 75 mL/hr.  Labs reviewed: Potassium 3.2, Creatinine 0.52, Phosphorus 2.3, Magnesium 1.5.  NUTRITION - FOCUSED PHYSICAL EXAM:    Most Recent Value  Orbital Region  Severe depletion  Upper Arm Region   Severe depletion  Thoracic and Lumbar Region  Severe depletion  Buccal Region  Severe depletion  Temple Region  Moderate depletion  Clavicle Bone Region  Severe depletion  Clavicle and Acromion Bone Region  Severe depletion  Scapular Bone Region  Severe depletion  Dorsal Hand  Severe depletion  Patellar Region  Severe depletion  Anterior Thigh Region  Severe depletion  Posterior Calf Region  Severe depletion  Edema (RD Assessment)  None  Hair  Reviewed  Eyes  Reviewed  Mouth  Reviewed  Skin  Reviewed  Nails  Reviewed     Diet Order:   Diet Order            Diet Heart Room service appropriate? Yes; Fluid consistency: Thin  Diet effective now             EDUCATION NEEDS:   No education needs have been identified at this time  Skin:  Skin Assessment: Skin Integrity Issues:(Stg II coccyx; MSAD to groin)  Last BM:  03/05/2019 - small type 1  Height:   Ht Readings from Last 1 Encounters:  03/05/19 5' 9" (1.753 m)   Weight:   Wt Readings from Last 1 Encounters:  03/05/19 49.2 kg   Ideal Body Weight:  72.7 kg  BMI:  Body mass index is 16.02 kg/m.  Estimated Nutritional Needs:   Kcal:  1500-1700  Protein:  75-85 grams  Fluid:  1.5-1.7 L/day  Willey Blade, MS, RD, LDN Office: [phone] Pager: [phone]  After Hours/Weekend Pager: [phone]

## 2019-03-07 ENCOUNTER — Other Ambulatory Visit: Payer: Self-pay

## 2019-03-07 LAB — CBC
HCT: 37 % — ABNORMAL LOW (ref 39.0–52.0)
Hemoglobin: 12.6 g/dL — ABNORMAL LOW (ref 13.0–17.0)
MCH: 34.1 pg — ABNORMAL HIGH (ref 26.0–34.0)
MCHC: 34.1 g/dL (ref 30.0–36.0)
MCV: 100.3 fL — ABNORMAL HIGH (ref 80.0–100.0)
Platelets: 267 10*3/uL (ref 150–400)
RBC: 3.69 MIL/uL — ABNORMAL LOW (ref 4.22–5.81)
RDW: 13.4 % (ref 11.5–15.5)
WBC: 10.2 10*3/uL (ref 4.0–10.5)
nRBC: 0 % (ref 0.0–0.2)

## 2019-03-07 LAB — MAGNESIUM: Magnesium: 1.6 mg/dL — ABNORMAL LOW (ref 1.7–2.4)

## 2019-03-07 LAB — BASIC METABOLIC PANEL
Anion gap: 9 (ref 5–15)
BUN: 7 mg/dL (ref 6–20)
CO2: 22 mmol/L (ref 22–32)
Calcium: 8.4 mg/dL — ABNORMAL LOW (ref 8.9–10.3)
Chloride: 104 mmol/L (ref 98–111)
Creatinine, Ser: 0.51 mg/dL — ABNORMAL LOW (ref 0.61–1.24)
GFR calc Af Amer: >60 mL/min (ref >60)
GFR calc non Af Amer: >60 mL/min (ref >60)
Glucose, Bld: 92 mg/dL (ref 70–99)
Potassium: 2.9 mmol/L — ABNORMAL LOW (ref 3.5–5.1)
Sodium: 135 mmol/L (ref 135–145)

## 2019-03-07 LAB — HIV ANTIBODY (ROUTINE TESTING W REFLEX): HIV Screen 4th Generation wRfx: NONREACTIVE

## 2019-03-07 LAB — PHOSPHORUS: Phosphorus: 3.6 mg/dL (ref 2.5–4.6)

## 2019-03-07 MED ORDER — MAGNESIUM SULFATE 2 GM/50ML IV SOLN
2.0000 g | Freq: Once | INTRAVENOUS | Status: AC
  Administered 2019-03-07: 2 g via INTRAVENOUS
  Filled 2019-03-07: qty 50

## 2019-03-07 MED ORDER — CHLORDIAZEPOXIDE HCL 25 MG PO CAPS
25.0000 mg | ORAL_CAPSULE | Freq: Two times a day (BID) | ORAL | Status: DC
  Administered 2019-03-07 – 2019-03-08 (×2): 25 mg via ORAL
  Filled 2019-03-07 (×2): qty 1

## 2019-03-07 MED ORDER — POTASSIUM CHLORIDE CRYS ER 20 MEQ PO TBCR
40.0000 meq | EXTENDED_RELEASE_TABLET | Freq: Once | ORAL | Status: AC
  Administered 2019-03-07: 40 meq via ORAL
  Filled 2019-03-07: qty 2

## 2019-03-07 MED ORDER — HYDRALAZINE HCL 20 MG/ML IJ SOLN: 10.0000 mg | Freq: Four times a day (QID) | INTRAMUSCULAR | Status: DC | PRN

## 2019-03-07 MED ORDER — ENOXAPARIN SODIUM 30 MG/0.3ML ~~LOC~~ SOLN
30.0000 mg | SUBCUTANEOUS | Status: DC
  Administered 2019-03-08: 30 mg via SUBCUTANEOUS
  Filled 2019-03-07: qty 0.3

## 2019-03-07 MED ORDER — POTASSIUM CHLORIDE 10 MEQ/100ML IV SOLN
10.0000 meq | INTRAVENOUS | Status: AC
  Administered 2019-03-07 (×2): 10 meq via INTRAVENOUS
  Filled 2019-03-07: qty 100

## 2019-03-07 MED ORDER — AMLODIPINE BESYLATE 10 MG PO TABS
10.0000 mg | ORAL_TABLET | Freq: Every day | ORAL | Status: DC
  Administered 2019-03-08: 10 mg via ORAL
  Filled 2019-03-07: qty 1

## 2019-03-07 NOTE — Evaluation (Signed)
Physical Therapy Evaluation Patient Details Name: Donald [name]. MRN: [id number] DOB: 15-Jan-1961 Today's Date: 03/07/2019   History of Present Illness  Donald [name] is a 67yoM who comes to [facility] after found on front porch by neigbors, arrived with tachycardia, concerns of ETOH withdrawl.  Clinical Impression  Pt admitted with above diagnosis. Pt currently with functional limitations due to the deficits listed below (see "PT Problem List"). Upon entry, pt in bed, awake and agreeable to participate. The pt is alert and oriented x3, pleasant, conversational, and generally a fair historian, minimal detail provided. Pt is globally weak, bradykinetic, minguard assist for all mobility, AMB tolerance limited to less than 87ft. Functional mobility assessment demonstrates increased effort/time requirements, poor tolerance, and need for physical assistance, whereas the patient performed these at a higher level of independence PTA.Pt will benefit from skilled PT intervention to increase independence and safety with basic mobility in preparation for discharge to the venue listed below.       Follow Up Recommendations Home health PT;Supervision for mobility/OOB;Supervision - Intermittent    Equipment Recommendations  None recommended by PT    Recommendations for Other Services       Precautions / Restrictions Precautions Precautions: Fall Restrictions Weight Bearing Restrictions: No      Mobility  Bed Mobility Overal bed mobility: Needs Assistance Bed Mobility: Supine to Sit;Sit to Supine     Supine to sit: Min guard Sit to supine: Min guard   General bed mobility comments: some assist needed for scooting toward [facility]  Transfers Overall transfer level: Needs assistance Equipment used: Rolling walker (2 wheeled) Transfers: Sit to/from Stand Sit to Stand: Min guard         General transfer comment: heavy BUE dependent strategy c leg weakness; near max effort  required.  Ambulation/Gait Ambulation/Gait assistance: Min guard Gait Distance (Feet): 18 Feet Assistive device: Rolling walker (2 wheeled)       General Gait Details: slow 3-point RW gait with HEAVY BUE support on RW. DIfficult to imagine being able to AMB with [facility]  Stairs            Wheelchair Mobility    Modified Rankin (Stroke Patients Only)       Balance Overall balance assessment: Modified Independent;Mild deficits observed, not formally tested                                           Pertinent Vitals/Pain Pain Assessment: No/denies pain    Home Living Family/patient expects to be discharged to:: Private residence Living Arrangements: Alone Available Help at Discharge: Family(mother helps with transport and groceries) Type of Home: House Home Access: Stairs to enter Entrance Stairs-Rails: Right Entrance Stairs-Number of Steps: 2 Home Layout: One level Home Equipment: Environmental consultant - 2 wheels;Cane - single point      Prior Function Level of Independence: Needs assistance   Gait / Transfers Assistance Needed: household distances with intermittent SPC use, chronic BLE fatigue from vascular claudication issues s/p stent placement x7  ADL's / Homemaking Assistance Needed: independent        Hand Dominance        Extremity/Trunk Assessment   Upper Extremity Assessment Upper Extremity Assessment: Generalized weakness;Overall [facility] for tasks assessed    Lower Extremity Assessment Lower Extremity Assessment: Generalized weakness    Cervical / Trunk Assessment Cervical / Trunk Assessment: Kyphotic(advanced thoracic kyphosis and forward head posture  for age)  Communication   Communication: No difficulties  Cognition Arousal/Alertness: Awake/alert;Lethargic Behavior During Therapy: WFL for tasks assessed/performed Overall Cognitive Status: No family/caregiver present to determine baseline cognitive functioning                                  General Comments: seems slightly slowed, but ansers questions appropriately      General Comments      Exercises     Assessment/Plan    PT Assessment Patient needs continued PT services  PT Problem List Decreased strength;Decreased balance;Decreased activity tolerance;Decreased mobility;Decreased safety awareness;Decreased knowledge of precautions       PT Treatment Interventions Balance training;Gait training;Stair training;Functional mobility training;Therapeutic activities;Therapeutic exercise;Patient/family education    PT Goals (Current goals can be found in the Care Plan section)  Acute Rehab PT Goals Patient Stated Goal: regain leg strength and improve AMB tolerance PT Goal Formulation: With patient Time For Goal Achievement: 03/21/19 Potential to Achieve Goals: Fair    Frequency Min 2X/week   Barriers to discharge Inaccessible home environment;Decreased caregiver support      Co-evaluation               AM-PAC PT "6 Clicks" Mobility  Outcome Measure Help needed turning from your back to your side while in a flat bed without using bedrails?: A Little Help needed moving from lying on your back to sitting on the side of a flat bed without using bedrails?: A Little Help needed moving to and from a bed to a chair (including a wheelchair)?: A Little Help needed standing up from a chair using your arms (e.g., wheelchair or bedside chair)?: A Little Help needed to walk in hospital room?: A Little Help needed climbing 3-5 steps with a railing? : A Little 6 Click Score: 18    End of Session   Activity Tolerance: Patient tolerated treatment well;No increased pain;Patient limited by fatigue Patient left: in bed;with bed alarm set;with call bell/phone within reach;Other (comment)(sittign tall for lunch tray on way; refuses OOB to chair) Nurse Communication: Mobility status PT Visit Diagnosis: Unsteadiness on feet (R26.81);Other abnormalities of  gait and mobility (R26.89);Muscle weakness (generalized) (M62.81);Difficulty in walking, not elsewhere classified (R26.2)    Time: 7116-5790 PT Time Calculation (min) (ACUTE ONLY): 21 min   Charges:   PT Evaluation $PT Eval Moderate Complexity: 1 Mod          1:48 PM, 03/07/19 Etta Grandchild, PT, DPT Physical Therapist - [facility]  [phone] ([place])   Charlen Bakula C 03/07/2019, 1:46 PM

## 2019-03-07 NOTE — Progress Notes (Signed)
[place] [facility] [place] NAME: Donald [name]    MR#:  [id number]  DATE OF BIRTH:  1961-01-24  SUBJECTIVE:  CHIEF COMPLAINT:   Chief Complaint  Patient presents with  . Tachycardia   No new complaint this morning.  No agitation reported overnight.  Patient awake and alert and oriented.    REVIEW OF SYSTEMS:  Review of Systems  Constitutional: Negative for chills and fever.  HENT: Negative for hearing loss and tinnitus.   Eyes: Negative for blurred vision and double vision.  Respiratory: Negative for cough and hemoptysis.   Cardiovascular: Negative for chest pain and palpitations.  Gastrointestinal: Negative for heartburn and nausea.  Genitourinary: Negative for dysuria and urgency.  Musculoskeletal: Negative for myalgias and neck pain.  Skin: Negative for itching and rash.  Neurological: Negative for dizziness.  Psychiatric/Behavioral: Negative for depression and hallucinations.      DRUG ALLERGIES:  No Known Allergies VITALS:  Blood pressure (!) 169/85, pulse (!) 106, temperature 97.8 F (36.6 C), temperature source Oral, resp. rate 20, height 5\' 9"  (1.753 m), weight 49.2 kg, SpO2 100 %. PHYSICAL EXAMINATION:   Physical Exam  Constitutional: He is oriented to person, place, and time. He appears well-developed and well-nourished.  HENT:  Head: Normocephalic and atraumatic.  Right Ear: External ear normal.  Eyes: Pupils are equal, round, and reactive to light. Conjunctivae are normal. Right eye exhibits no discharge.  Neck: Normal range of motion. Neck supple. No thyromegaly present.  Cardiovascular: Normal rate, regular rhythm and normal heart sounds.  Respiratory: Effort normal and breath sounds normal. No respiratory distress.  GI: Soft. Bowel sounds are normal. He exhibits no distension.  Musculoskeletal: Normal range of motion.        General: No edema.  Neurological: He is alert and oriented to person, place, and time.   Skin: Skin is warm. He is not diaphoretic. No erythema.  Psychiatric: He has a normal mood and affect.   LABORATORY PANEL:  Male CBC Recent Labs  Lab 03/07/19 0349  WBC 10.2  HGB 12.6*  HCT 37.0*  PLT 267   ------------------------------------------------------------------------------------------------------------------ Chemistries  Recent Labs  Lab 03/07/19 0349  NA 135  K 2.9*  CL 104  CO2 22  GLUCOSE 92  BUN 7  CREATININE 0.51*  CALCIUM 8.4*  MG 1.6*   RADIOLOGY:  No results found. ASSESSMENT AND PLAN:    1. DTs (delirium tremens) ([place])  Significantly improved clinically.  Patient awake and alert and oriented at this time. Initiated gradual tapering of Librium from 25 mg every 6 hourly to twice daily.  Patient on CIWA protocol.Urine drug screen negative.  Continue thiamine, multivitamin and folic acid Alcohol level was 62 on admission. Patient improving clinically.  No agitation.  Confusion significantly improved.  2. Essential hypertension - Blood pressure uncontrolled.  Increased dose of Norvasc from 5 to 10 mg daily.  PRN IV hydralazine with parameters.  Continue clonidine   3.  PAD (peripheral artery disease) ([place]) -continue home meds  4. Benign prostatic hyperplasia -home dose Flomax  5.  Hypokalemia; replaced.  Follow-up on repeat levels  DVT prophylaxis; Lovenox Physical therapist to evaluate and treat.  Patient lives alone at home.  All the records are reviewed and case discussed with Care Management/Social Worker. Management plans discussed with the patient, family and they are in agreement.  CODE STATUS: Full Code  TOTAL TIME TAKING CARE OF THIS PATIENT: 36 minutes.   More than 50% of  the time was spent in counseling/coordination of care: YES  POSSIBLE D/C IN 2 DAYS, DEPENDING ON CLINICAL CONDITION.   Ronnisha Felber M.D on 03/07/2019 at 12:38 PM  Between 7am to 6pm - Pager - [phone]  After 6pm go to [email or web address] - Proofreader   [facility] [place] [facility]  Office  [phone]  CC: Primary care physician; Olin Hauser, DO  Note: This dictation was prepared with Dragon dictation along with smaller phrase technology. Any transcriptional errors that result from this process are unintentional.

## 2019-03-07 NOTE — Progress Notes (Signed)
PHARMACIST - PHYSICIAN COMMUNICATION  CONCERNING:  Enoxaparin (Lovenox) for DVT Prophylaxis    RECOMMENDATION: Patient was prescribed enoxaprin 40mg  q24 hours for VTE prophylaxis.   Filed Weights   03/04/19 1853 03/05/19 0340  Weight: 130 lb (59 kg) 108 lb 7.5 oz (49.2 kg)    Body mass index is 16.02 kg/m.  Estimated Creatinine Clearance: 72.6 mL/min (A) (by C-G formula based on SCr of 0.51 mg/dL (L)).   Based on [place], patient is candidate for enoxaparin 30 mg every 24 hours based on weight less than 50kg for male  DESCRIPTION: Pharmacy has adjusted enoxaparin dose per [facility] policy.   Patient is now receiving enoxaparin 30mg  every 24 hours.  Tawnya Crook, PharmD Clinical Pharmacist  03/07/2019 10:28 AM

## 2019-03-08 LAB — CBC
HCT: 36.7 % — ABNORMAL LOW (ref 39.0–52.0)
Hemoglobin: 12.6 g/dL — ABNORMAL LOW (ref 13.0–17.0)
MCH: 34 pg (ref 26.0–34.0)
MCHC: 34.3 g/dL (ref 30.0–36.0)
MCV: 98.9 fL (ref 80.0–100.0)
Platelets: 264 10*3/uL (ref 150–400)
RBC: 3.71 MIL/uL — ABNORMAL LOW (ref 4.22–5.81)
RDW: 13.1 % (ref 11.5–15.5)
WBC: 10.2 10*3/uL (ref 4.0–10.5)
nRBC: 0 % (ref 0.0–0.2)

## 2019-03-08 LAB — BASIC METABOLIC PANEL
Anion gap: 10 (ref 5–15)
BUN: <5 mg/dL — ABNORMAL LOW (ref 6–20)
CO2: 18 mmol/L — ABNORMAL LOW (ref 22–32)
Calcium: 8.7 mg/dL — ABNORMAL LOW (ref 8.9–10.3)
Chloride: 106 mmol/L (ref 98–111)
Creatinine, Ser: 0.4 mg/dL — ABNORMAL LOW (ref 0.61–1.24)
GFR calc Af Amer: >60 mL/min (ref >60)
GFR calc non Af Amer: >60 mL/min (ref >60)
Glucose, Bld: 96 mg/dL (ref 70–99)
Potassium: 3.3 mmol/L — ABNORMAL LOW (ref 3.5–5.1)
Sodium: 134 mmol/L — ABNORMAL LOW (ref 135–145)

## 2019-03-08 LAB — MAGNESIUM: Magnesium: 1.6 mg/dL — ABNORMAL LOW (ref 1.7–2.4)

## 2019-03-08 MED ORDER — CHLORDIAZEPOXIDE HCL 25 MG PO CAPS: ORAL_CAPSULE | ORAL | 0 refills | Status: DC

## 2019-03-08 MED ORDER — AMLODIPINE BESYLATE 10 MG PO TABS: 10.0000 mg | ORAL_TABLET | Freq: Every day | ORAL | 0 refills | Status: DC

## 2019-03-08 MED ORDER — POTASSIUM CHLORIDE CRYS ER 20 MEQ PO TBCR
40.0000 meq | EXTENDED_RELEASE_TABLET | Freq: Once | ORAL | Status: AC
  Administered 2019-03-08: 08:00:00 40 meq via ORAL
  Filled 2019-03-08: qty 2

## 2019-03-08 MED ORDER — ENSURE ENLIVE PO LIQD: 237.0000 mL | Freq: Two times a day (BID) | ORAL | 0 refills | Status: DC

## 2019-03-08 MED ORDER — MAGNESIUM SULFATE 2 GM/50ML IV SOLN
2.0000 g | Freq: Once | INTRAVENOUS | Status: AC
  Administered 2019-03-08: 2 g via INTRAVENOUS
  Filled 2019-03-08: qty 50

## 2019-03-08 MED ORDER — CLONIDINE HCL 0.1 MG PO TABS: 0.1000 mg | ORAL_TABLET | Freq: Two times a day (BID) | ORAL | 0 refills | Status: DC

## 2019-03-08 NOTE — Discharge Summary (Signed)
[facility] [place] NAME: Donald [name]    MR#:  [id number]  DATE OF BIRTH:  1961-04-30  DATE OF ADMISSION:  03/04/2019   ADMITTING PHYSICIAN: Lance Coon, MD  DATE OF DISCHARGE: No discharge date for patient encounter.  PRIMARY CARE PHYSICIAN: Olin Hauser, DO   ADMISSION DIAGNOSIS:  Dehydration [E86.0] Tachycardia [R00.0] Alcohol withdrawal syndrome, with delirium ([place]) [F10.231] DISCHARGE DIAGNOSIS:  Principal Problem:   DTs (delirium tremens) ([place]) Active Problems:   Essential hypertension   Benign prostatic hyperplasia   PAD (peripheral artery disease) (HCC)   Pressure injury of skin   Protein-calorie malnutrition, severe  SECONDARY DIAGNOSIS:   Past Medical History:  Diagnosis Date   Depression    Emphysema of lung ([place])    Essential hypertension    History of chicken pox    Low weight    Neuropathy    bilateral lower legs   Peripheral vascular disease (HCC)    Prostate disease    Seasonal allergies    Seizures ([place])    Wears dentures    full upper   HOSPITAL COURSE:  Chief complaint; tachycardia  History of presenting complaint; Donald [name]  is a 58 y.o. male who presented to the ED after EMS found him laying on his porch front of his house.  Neighbors had seen him and called EMS.  Patient is confused here in the ED, significantly tachycardic.  His work-up is largely unremarkable in terms of any sort of infection or other acute pathology.  However, he has stated that he does drink frequently, but has not had anything to drink for the last day or so.  Strong concern for withdrawal and potentially DTs.  At one point the patient was hallucinating in the ED department, seeing young children in his room.  Hospitalist called for admission   Hospital course; 1. DTs (delirium tremens)  Resolved.  Patient awake and alert and oriented.  Was being managed with Librium in addition to it was  protocol.  Librium was being gradually tapered.  No issues reported overnight.  No agitation.  No confusion.  Patient appears back to baseline.  Discharge on tapering dose of Librium for a few more days.  Urine drug screen was negative.  Was treated with p.o. thiamine, multivitamin and folic acid.  Alcohol level was 62 on admission.  Patient seen by physical therapist and recommended home health with physical therapist.  2.Essential hypertension  Blood pressure control better.  Tachycardia resolved.  Continue beta-blocker and dose of Norvasc was increased to 10 mg p.o. daily.  Clonidine was also added.  Follow-up with primary care physician for monitoring of blood pressures.   3.PAD (peripheral artery disease) ([place]) -continue home meds  4.Benign prostatic hyperplasia -home dose Flomax  5.  Hypokalemia and hypomagnesemia; replaced prior to discharge  Patient clinically and hemodynamically stable.  Request for discharge home today.  I offered to call family for update.  Patient declined and stated he would update his family personally.  Awake and alert and oriented and able to make his own medical decisions.  DISCHARGE CONDITIONS:  Stable CONSULTS OBTAINED:   DRUG ALLERGIES:  No Known Allergies DISCHARGE MEDICATIONS:   Allergies as of 03/08/2019   No Known Allergies     Medication List    TAKE these medications   albuterol 108 (90 Base) MCG/ACT inhaler Commonly known as: VENTOLIN HFA Inhale 2 puffs into the lungs every 6 (six) hours as needed for  wheezing or shortness of breath.   amLODipine 10 MG tablet Commonly known as: NORVASC Take 1 tablet (10 mg total) by mouth daily. Start taking on: March 09, 2019 What changed:   medication strength  how much to take   aspirin 81 MG tablet Take 1 tablet (81 mg total) by mouth daily.   chlordiazePOXIDE 25 MG capsule Commonly known as: LIBRIUM 25 mg p.o. twice daily x2 days Then 25 mg p.o. daily x2 days   cloNIDine 0.1 MG  tablet Commonly known as: CATAPRES Take 1 tablet (0.1 mg total) by mouth 2 (two) times daily.   feeding supplement (ENSURE ENLIVE) Liqd Take 237 mLs by mouth 2 (two) times daily between meals.   Fluticasone-Salmeterol 250-50 MCG/DOSE Aepb Commonly known as: ADVAIR Inhale 1 puff into the lungs 2 (two) times daily.   megestrol 40 MG tablet Commonly known as: MEGACE Take 2-4 tablets (80-160 mg total) by mouth daily. Start with 2 tablets a day, then gradually increase as tolerated up to 4 per day   metoprolol succinate 50 MG 24 hr tablet Commonly known as: TOPROL-XL Take 1 tablet (50 mg total) by mouth daily.   tamsulosin 0.4 MG Caps capsule Commonly known as: FLOMAX Take 1 capsule (0.4 mg total) by mouth daily after supper.        DISCHARGE INSTRUCTIONS:   DIET:  Cardiac diet DISCHARGE CONDITION:  Stable ACTIVITY:  Activity as tolerated OXYGEN:  Home Oxygen: No.  Oxygen Delivery: room air DISCHARGE LOCATION:  home   If you experience worsening of your admission symptoms, develop shortness of breath, life threatening emergency, suicidal or homicidal thoughts you must seek medical attention immediately by calling 911 or calling your MD immediately  if symptoms less severe.  You Must read complete instructions/literature along with all the possible adverse reactions/side effects for all the Medicines you take and that have been prescribed to you. Take any new Medicines after you have completely understood and accpet all the possible adverse reactions/side effects.   Please note  You were cared for by a hospitalist during your hospital stay. If you have any questions about your discharge medications or the care you received while you were in the hospital after you are discharged, you can call the unit and asked to speak with the hospitalist on call if the hospitalist that took care of you is not available. Once you are discharged, your primary care physician will handle any  further medical issues. Please note that NO REFILLS for any discharge medications will be authorized once you are discharged, as it is imperative that you return to your primary care physician (or establish a relationship with a primary care physician if you do not have one) for your aftercare needs so that they can reassess your need for medications and monitor your lab values.    On the day of Discharge:  VITAL SIGNS:  Blood pressure (!) 149/79, pulse 89, temperature 97.7 F (36.5 C), temperature source Oral, resp. rate 20, height 5\' 9"  (1.753 m), weight 49.2 kg, SpO2 97 %. PHYSICAL EXAMINATION:  GENERAL:  58 y.o.-year-old patient lying in the bed with no acute distress.  EYES: Pupils equal, round, reactive to light and accommodation. No scleral icterus. Extraocular muscles intact.  HEENT: Head atraumatic, normocephalic. Oropharynx and nasopharynx clear.  NECK:  Supple, no jugular venous distention. No thyroid enlargement, no tenderness.  LUNGS: Normal breath sounds bilaterally, no wheezing, rales,rhonchi or crepitation. No use of accessory muscles of respiration.  CARDIOVASCULAR: S1, S2 normal.  No murmurs, rubs, or gallops.  ABDOMEN: Soft, non-tender, non-distended. Bowel sounds present. No organomegaly or mass.  EXTREMITIES: No pedal edema, cyanosis, or clubbing.  NEUROLOGIC: Cranial nerves II through XII are intact. Muscle strength 5/5 in all extremities. Sensation intact. Gait not checked.  PSYCHIATRIC: The patient is alert and oriented x 3.  SKIN: No obvious rash, lesion, or ulcer.  DATA REVIEW:   CBC Recent Labs  Lab 03/08/19 0435  WBC 10.2  HGB 12.6*  HCT 36.7*  PLT 264    Chemistries  Recent Labs  Lab 03/08/19 0435  NA 134*  K 3.3*  CL 106  CO2 18*  GLUCOSE 96  BUN <5*  CREATININE 0.40*  CALCIUM 8.7*  MG 1.6*     Microbiology Results  Results for orders placed or performed during the hospital encounter of 03/04/19  SARS Coronavirus 2 (CEPHEID- Performed in  [place] hospital lab), Hosp Order     Status: None   Collection Time: 03/04/19  9:34 PM   Specimen: Nasopharyngeal Swab  Result Value Ref Range Status   SARS Coronavirus 2 NEGATIVE NEGATIVE Final    Comment: (NOTE) If result is NEGATIVE SARS-CoV-2 target nucleic acids are NOT DETECTED. The SARS-CoV-2 RNA is generally detectable in upper and lower  respiratory specimens during the acute phase of infection. The lowest  concentration of SARS-CoV-2 viral copies this assay can detect is 250  copies / mL. A negative result does not preclude SARS-CoV-2 infection  and should not be used as the sole basis for treatment or other  patient management decisions.  A negative result may occur with  improper specimen collection / handling, submission of specimen other  than nasopharyngeal swab, presence of viral mutation(s) within the  areas targeted by this assay, and inadequate number of viral copies  (<250 copies / mL). A negative result must be combined with clinical  observations, patient history, and epidemiological information. If result is POSITIVE SARS-CoV-2 target nucleic acids are DETECTED. The SARS-CoV-2 RNA is generally detectable in upper and lower  respiratory specimens dur ing the acute phase of infection.  Positive  results are indicative of active infection with SARS-CoV-2.  Clinical  correlation with patient history and other diagnostic information is  necessary to determine patient infection status.  Positive results do  not rule out bacterial infection or co-infection with other viruses. If result is PRESUMPTIVE POSTIVE SARS-CoV-2 nucleic acids MAY BE PRESENT.   A presumptive positive result was obtained on the submitted specimen  and confirmed on repeat testing.  While 2019 novel coronavirus  (SARS-CoV-2) nucleic acids may be present in the submitted sample  additional confirmatory testing may be necessary for epidemiological  and / or clinical management purposes  to  differentiate between  SARS-CoV-2 and other Sarbecovirus currently known to infect humans.  If clinically indicated additional testing with an alternate test  methodology [phone]) is advised. The SARS-CoV-2 RNA is generally  detectable in upper and lower respiratory sp ecimens during the acute  phase of infection. The expected result is Negative. Fact Sheet for Patients:  StrictlyIdeas.no Fact Sheet for Healthcare Providers: BankingDealers.co.za This test is not yet approved or cleared by the [place] FDA and has been authorized for detection and/or diagnosis of SARS-CoV-2 by FDA under an Emergency Use Authorization (EUA).  This EUA will remain in effect (meaning this test can be used) for the duration of the COVID-19 declaration under Section 564(b)(1) of the Act, 21 U.S.C. section 360bbb-3(b)(1), unless the authorization is terminated or  revoked sooner. Performed at [facility], [address], [place], [place] [address]     RADIOLOGY:  No results found.   Management plans discussed with the patient, family and they are in agreement.  CODE STATUS: Full Code   TOTAL TIME TAKING CARE OF THIS PATIENT: 37 minutes.    Rolin Schult M.D on 03/08/2019 at 9:56 AM  Between 7am to 6pm - Pager - [phone]  After 6pm go to [email or web address] - Proofreader  [facility]  Office  [phone]  CC: Primary care physician; Olin Hauser, DO   Note: This dictation was prepared with Dragon dictation along with smaller phrase technology. Any transcriptional errors that result from this process are unintentional.

## 2019-03-08 NOTE — Progress Notes (Signed)
Pt fro d/c mom here to pick him up discharge discussed with pts mom. Packet with pt. Out via w/c sl  D/cd

## 2019-03-08 NOTE — TOC Transition Note (Signed)
Transition of Care [facility]) - CM/SW Discharge Note   Patient Details  Name: Donald [name]. MRN: [id number] Date of Birth: 27-Nov-1960  [facility]) CM/SW Contact:  Deneise Getty, Lenice Llamas Phone Number: [phone]  03/08/2019, 11:03 AM   Clinical Narrative:  Clinical Social Worker (CSW) met with patient to discuss D/C plan. Patient was alert and oriented X3 and was laying in the bed. Patient was not oriented to time. Patient was orineted to self, place and situation. CSW introduced self and explained role of CSW department. Per patient he lives alone in [place] and relies on his mother for transportation. Patient reported that he has a walker at home. Patient has no DME needs. CSW explained home health PT and patient declined home health. Per patient he does not want PT at home. Patient reported that he drinks alcohol daily however he plans to stop for health reasons. Patient reported that he is not interested in counseling. CSW provided patient with a list of [place] resources including [facility]. Patient reported no other needs or concerns. Please reconsult if future social work needs arise. CSW signing off.      Final next level of care: Home/Self Care Barriers to Discharge: Barriers Resolved   Patient Goals and CMS Choice Patient states their goals for this hospitalization and ongoing recovery are:: To feel better.      Discharge Placement                       Discharge Plan and Services                                     Social Determinants of Health (SDOH) Interventions     Readmission Risk Interventions No flowsheet data found.

## 2019-03-08 NOTE — Plan of Care (Signed)
  Problem: Education: Goal: Knowledge of General Education information will improve Description Including pain rating scale, medication(s)/side effects and non-pharmacologic comfort measures Outcome: Progressing   

## 2019-03-09 ENCOUNTER — Ambulatory Visit: Payer: Self-pay | Admitting: Licensed Clinical Social Worker

## 2019-03-09 ENCOUNTER — Telehealth: Payer: Self-pay

## 2019-03-09 DIAGNOSIS — I739 Peripheral vascular disease, unspecified: Secondary | ICD-10-CM

## 2019-03-09 DIAGNOSIS — F1021 Alcohol dependence, in remission: Secondary | ICD-10-CM

## 2019-03-09 DIAGNOSIS — I10 Essential (primary) hypertension: Secondary | ICD-10-CM

## 2019-03-09 NOTE — TOC Progression Note (Signed)
Transition of Care (TOC) - Progression Note    Patient Details  Name: Donald [name]. MRN: [id number] Date of Birth: [birth year]  Transition of Care [facility]) CM/SW Contact  Donald [name], Donald [name] Phone Number: [phone]  03/09/2019, 2:28 PM  Clinical Narrative:  Patient has no SNF bed offers in [facility] or [place]. [facility] and [facility] declined patient. Clinical Social Worker (CSW) contacted patient's mother Donald [name] and made her aware of above. CSW made mother aware that barrier to SNF placement his medicaid and patient's alcohol use. Mother verbalized her understanding and thanked CSW for assisting. Per mother she has an ultrasound appointment for patient on Monday 7/6.       Barriers to Discharge: Barriers Resolved  Expected Discharge Plan and Services           Expected Discharge Date: 03/08/19                                     Social Determinants of Health (SDOH) Interventions    Readmission Risk Interventions No flowsheet data found.

## 2019-03-09 NOTE — Telephone Encounter (Signed)
Transition Care Management Follow-up Telephone Call  Spoke with patients mom- [name]  Date of discharge and from where: 03/08/2019 at [facility]   How have you been since you were released from the hospital? "Not good, he cant walk and I am having a hard time caring for him"   Any questions or concerns? " yes, I was going to bring him up there today, he cant walk. He cant move his legs enough to move with the crutches or cane. He cannot transfer by himself to go to the bathroom. I think he needs to go to a facility like [facility] so he can have someone care for him as I cannot. I am not giving him this librium as I dont think his alcohol level was 62, he doesn't drink., but he cannot currently do anything and I cant help him much. I had to bring him home with me though because I knew he couldn't stay by himself"  Spoke with Dr.Karamalegos about patients moms concern and he reviewed patients chart, appears home health wwas ordered for patient. patient should be evaluated by home health today and can do hospital follow up on 7/6.  Upon further review patient had declined home health services. Will place urgent referral to CCM team for assistance with this.  Informed patients mom with this information, and that someone would contact her back today to keep her informed. She will call interm with any other concerns and is okay with the current plan to get [facility] and social worker involved for assistance.   Items Reviewed:  Did the pt receive and understand the discharge instructions provided? No   Medications obtained and verified? Yes   Any new allergies since your discharge? No   Dietary orders reviewed? Yes  Do you have support at home? Yes   Functional Questionnaire: (I = Independent and D = Dependent) ADLs:   Bathing/Dressing- D  Meal Prep- D  Eating- D  Maintaining continence- I , using urinal provided from hospital right now  Transferring/Ambulation- D  Managing Meds-  D  Follow up appointments reviewed:   PCP Hospital f/u appt confirmed? Yes  Scheduled to see Dr.Karamlegos on 03/13/2019 @ 11:00am.  [facility] f/u appt confirmed? n/a  Are transportation arrangements needed? No   If their condition worsens, is the pt aware to call PCP or go to the Emergency Dept.? Yes  Was the patient provided with contact information for the PCP's office or ED? Yes  Was to pt encouraged to call back with questions or concerns? Yes

## 2019-03-09 NOTE — Chronic Care Management (AMB) (Signed)
Visit Information  Goals Addressed    . "I need a higher leve of care now."       Current Barriers:  . Financial constraints . Limited social support . Level of care concerns . ADL IADL limitations . Mental Health Concerns  . Substance abuse issues - alcohol . Limited education about LTC placement*  Clinical Social Work Clinical Goal(s):  Marland Kitchen Over the next 30 days, client will work with SW to address concerns related to getting patient placed into a skilled nursing facility.  Interventions: . Patient interviewed and appropriate assessments performed . Provided patient with information about LTC placement process . Discussed plans with patient for ongoing care management follow up and provided patient with direct contact information for care management team . Advised patient's family of her options in terms of securing placement now that family feel that patient IS NOT SAFE at home- Patient has had a PT eval but no FL2 or PASRR.  o Patient can go back to ED and seek immediate SNF placement due to safety concerns o CCM Clinic LCSW can initiate the SNF placement process but this will take longer-Family does not wish to wait due to ongoing safety concerns regarding patient Family is aware that securing SNF placement from a outpatient setting instead of inpatient takes more resources and time. [name] with LPN [name] and primary care provider re: Family plans to transport patient back to the ED today. If this plan changes, LCSW will initiate SNF placement from outpatient setting. . Assisted patient/caregiver with obtaining information about health plan benefits . Provided education to patient/caregiver regarding level of care options.  Patient Self Care Activities:  . Currently UNABLE TO independently take care of self.  Initial goal documentation     Donald [name] was given information about Care Management services today including:  1. Care Management services include  personalized support from designated clinical staff supervised by his physician, including individualized plan of care and coordination with other care providers 2. 24/7 contact phone numbers for assistance for urgent and routine care needs. 3. The patient may stop CCM services at any time (effective at the end of the month) by phone call to the office staff.  Patient agreed to services and verbal consent obtained.   The care management team will reach out to the patient again over the next 7 days.   Donald [name], BSW, MSW, [email or web address] Phone: [phone]

## 2019-03-09 NOTE — TOC Progression Note (Signed)
Transition of Care (TOC) - Progression Note    Patient Details  Name: Donald [name]. MRN: [id number] Date of Birth: May 21, 1961  Transition of Care [facility]) CM/SW Contact  Yuliya Nova, Lenice Llamas Phone Number: [phone]  03/09/2019, 11:02 AM  Clinical Narrative: Clinical Social Worker (CSW) received a call from patient's mother Blanch Media today 7/2. Per mother patient can't walk and needs to go to a SNF. Per mother patient will be willing to sign over his SSI check $783 to the facility. CSW explained to mother that patient has medicaid only and will have to be willing to go outside of [place] because medicaid finds are limited, be willing to sign over his SSI check to the facility and stay at the facility for 30 days. Mother verbalized her understanding and asked CSW to start SNF search. FL2 and PASARR completed, CSW faxed out referral.   [facility] and [facility] are reviewing referral.       Barriers to Discharge: Barriers Resolved  Expected Discharge Plan and Services           Expected Discharge Date: 03/08/19                                     Social Determinants of Health (SDOH) Interventions    Readmission Risk Interventions No flowsheet data found.

## 2019-03-09 NOTE — NC FL2 (Signed)
[place] LEVEL OF CARE SCREENING TOOL     IDENTIFICATION  Patient Name: Donald [name]. Birthdate: 03-04-1961 Sex: male Admission Date (Current Location): 03/04/2019  [place] and [place] Number:  Selena Lesser [id number] T Facility and Address:  [facility], [address] [place], [place] [address]      Provider Number: [id number]  Attending Physician Name and Address:  No att. providers found  Relative Name and Phone Number:       Current Level of Care: Hospital Recommended Level of Care: [place] Prior Approval Number:    Date Approved/Denied:   PASRR Number: [id number]  Discharge Plan: SNF    Current Diagnoses: Patient Active Problem List   Diagnosis Date Noted  . Protein-calorie malnutrition, severe 03/06/2019  . Pressure injury of skin 03/05/2019  . DTs (delirium tremens) ([place]) 03/04/2019  . Protein calorie malnutrition ([place]) 04/12/2018  . Insomnia 04/12/2018  . Low weight 07/06/2017  . Environmental and seasonal allergies 12/29/2016  . Special screening for malignant neoplasms, colon   . Benign neoplasm of cecum   . Benign neoplasm of transverse colon   . Benign neoplasm of sigmoid colon   . Alcohol dependence, in remission ([place]) 01/20/2016  . Mononeuropathy of both lower extremities 01/20/2016  . 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 Hyponatremia 11/14/2015  . Peripheral neuropathy 07/29/2015  . Essential hypertension 06/20/2015  . Benign prostatic hyperplasia 06/20/2015  . PAD (peripheral artery disease) ([place]) 06/20/2015  . Former smoker 06/20/2015  . Centrilobular emphysema ([place]) 06/20/2015    Orientation RESPIRATION BLADDER Height & Weight     Self, Time, Situation, Place  Normal Incontinent Weight: 108 lb 7.5 oz (49.2 kg) Height:  5\' 9"  (175.3 cm)  BEHAVIORAL SYMPTOMS/MOOD NEUROLOGICAL BOWEL NUTRITION STATUS      Continent Diet(Diet: Low Sodium/ Heart Healthy)  AMBULATORY STATUS COMMUNICATION OF  NEEDS Skin   Extensive Assist Verbally PU Stage and Appropriate Care(Pressure Ulcer Stage 2 on Coccyx.)                       Personal Care Assistance Level of Assistance  Bathing, Feeding, Dressing Bathing Assistance: Limited assistance Feeding assistance: Independent Dressing Assistance: Limited assistance     Functional Limitations Info  Sight, Hearing, Speech Sight Info: Adequate Hearing Info: Adequate Speech Info: Adequate    SPECIAL CARE FACTORS FREQUENCY  PT (By licensed PT)     PT Frequency: 4              Contractures      Additional Factors Info  Code Status, Allergies Code Status Info: Full Code. Allergies Info: No Known Allergies.           Current Medications (03/09/2019):  This is the current hospital active medication list No current facility-administered medications for this encounter.    Current Outpatient Medications  Medication Sig Dispense Refill  . albuterol (VENTOLIN HFA) 108 (90 Base) MCG/ACT inhaler Inhale 2 puffs into the lungs every 6 (six) hours as needed for wheezing or shortness of breath. 8.5 g 2  . aspirin 81 MG tablet Take 1 tablet (81 mg total) by mouth daily. 30 tablet 11  . Fluticasone-Salmeterol (ADVAIR) 250-50 MCG/DOSE AEPB Inhale 1 puff into the lungs 2 (two) times daily. 60 each 11  . megestrol (MEGACE) 40 MG tablet Take 2-4 tablets (80-160 mg total) by mouth daily. Start with 2 tablets a day, then gradually increase as tolerated up to 4 per day 120 tablet 0  .  metoprolol succinate (TOPROL-XL) 50 MG 24 hr tablet Take 1 tablet (50 mg total) by mouth daily. 90 tablet 1  . tamsulosin (FLOMAX) 0.4 MG CAPS capsule Take 1 capsule (0.4 mg total) by mouth daily after supper. 90 capsule 3  . amLODipine (NORVASC) 10 MG tablet Take 1 tablet (10 mg total) by mouth daily. 30 tablet 0  . chlordiazePOXIDE (LIBRIUM) 25 MG capsule 25 mg p.o. twice daily x2 days Then 25 mg p.o. daily x2 days (Patient not taking: Reported on 03/09/2019) 6  capsule 0  . cloNIDine (CATAPRES) 0.1 MG tablet Take 1 tablet (0.1 mg total) by mouth 2 (two) times daily. 60 tablet 0  . feeding supplement, ENSURE ENLIVE, (ENSURE ENLIVE) LIQD Take 237 mLs by mouth 2 (two) times daily between meals. 14220 mL 0     Discharge Medications: Please see discharge summary for a list of discharge medications.  Relevant Imaging Results:  Relevant Lab Results:   Additional Information SSN: [id number]  Chaunta Bejarano, Veronia Beets, LCSW

## 2019-03-13 ENCOUNTER — Emergency Department: Payer: Medicaid Other

## 2019-03-13 ENCOUNTER — Other Ambulatory Visit (INDEPENDENT_AMBULATORY_CARE_PROVIDER_SITE_OTHER): Payer: Self-pay | Admitting: Vascular Surgery

## 2019-03-13 ENCOUNTER — Ambulatory Visit: Payer: Self-pay | Admitting: *Deleted

## 2019-03-13 ENCOUNTER — Other Ambulatory Visit: Payer: Self-pay

## 2019-03-13 ENCOUNTER — Telehealth: Payer: Self-pay

## 2019-03-13 ENCOUNTER — Encounter (INDEPENDENT_AMBULATORY_CARE_PROVIDER_SITE_OTHER): Payer: Self-pay | Admitting: Vascular Surgery

## 2019-03-13 ENCOUNTER — Telehealth: Payer: Medicaid Other

## 2019-03-13 ENCOUNTER — Inpatient Hospital Stay: Payer: Medicaid Other | Admitting: Family Medicine

## 2019-03-13 ENCOUNTER — Encounter
Admission: EM | Disposition: A | Discharge status: Skilled Nursing Facility | Payer: Self-pay | Source: Home / Self Care | Attending: Internal Medicine

## 2019-03-13 ENCOUNTER — Inpatient Hospital Stay
Admission: EM | Admit: 2019-03-13 | Discharge: 2019-03-24 | DRG: 853 | Disposition: A | Discharge status: Skilled Nursing Facility | Payer: Medicaid Other | Attending: Internal Medicine | Admitting: Internal Medicine

## 2019-03-13 ENCOUNTER — Ambulatory Visit: Payer: Medicaid Other

## 2019-03-13 DIAGNOSIS — Z681 Body mass index (BMI) 19 or less, adult: Secondary | ICD-10-CM | POA: Diagnosis not present

## 2019-03-13 DIAGNOSIS — I70202 Unspecified atherosclerosis of native arteries of extremities, left leg: Secondary | ICD-10-CM

## 2019-03-13 DIAGNOSIS — E876 Hypokalemia: Secondary | ICD-10-CM | POA: Diagnosis present

## 2019-03-13 DIAGNOSIS — E43 Unspecified severe protein-calorie malnutrition: Secondary | ICD-10-CM | POA: Diagnosis present

## 2019-03-13 DIAGNOSIS — R627 Adult failure to thrive: Secondary | ICD-10-CM | POA: Diagnosis present

## 2019-03-13 DIAGNOSIS — Z7401 Bed confinement status: Secondary | ICD-10-CM

## 2019-03-13 DIAGNOSIS — Z9862 Peripheral vascular angioplasty status: Secondary | ICD-10-CM | POA: Diagnosis not present

## 2019-03-13 DIAGNOSIS — Z66 Do not resuscitate: Secondary | ICD-10-CM | POA: Diagnosis present

## 2019-03-13 DIAGNOSIS — K029 Dental caries, unspecified: Secondary | ICD-10-CM | POA: Diagnosis present

## 2019-03-13 DIAGNOSIS — Z86718 Personal history of other venous thrombosis and embolism: Secondary | ICD-10-CM | POA: Diagnosis not present

## 2019-03-13 DIAGNOSIS — Z87891 Personal history of nicotine dependence: Secondary | ICD-10-CM

## 2019-03-13 DIAGNOSIS — T82856A Stenosis of peripheral vascular stent, initial encounter: Secondary | ICD-10-CM

## 2019-03-13 DIAGNOSIS — F1021 Alcohol dependence, in remission: Secondary | ICD-10-CM

## 2019-03-13 DIAGNOSIS — A419 Sepsis, unspecified organism: Principal | ICD-10-CM

## 2019-03-13 DIAGNOSIS — Z79899 Other long term (current) drug therapy: Secondary | ICD-10-CM | POA: Diagnosis not present

## 2019-03-13 DIAGNOSIS — I639 Cerebral infarction, unspecified: Secondary | ICD-10-CM

## 2019-03-13 DIAGNOSIS — L304 Erythema intertrigo: Secondary | ICD-10-CM | POA: Diagnosis not present

## 2019-03-13 DIAGNOSIS — J449 Chronic obstructive pulmonary disease, unspecified: Secondary | ICD-10-CM | POA: Diagnosis not present

## 2019-03-13 DIAGNOSIS — Z7951 Long term (current) use of inhaled steroids: Secondary | ICD-10-CM | POA: Diagnosis not present

## 2019-03-13 DIAGNOSIS — I8290 Acute embolism and thrombosis of unspecified vein: Secondary | ICD-10-CM

## 2019-03-13 DIAGNOSIS — G4700 Insomnia, unspecified: Secondary | ICD-10-CM | POA: Diagnosis present

## 2019-03-13 DIAGNOSIS — Z85038 Personal history of other malignant neoplasm of large intestine: Secondary | ICD-10-CM | POA: Diagnosis not present

## 2019-03-13 DIAGNOSIS — R000 Tachycardia, unspecified: Secondary | ICD-10-CM | POA: Diagnosis not present

## 2019-03-13 DIAGNOSIS — Z515 Encounter for palliative care: Secondary | ICD-10-CM | POA: Diagnosis not present

## 2019-03-13 DIAGNOSIS — R443 Hallucinations, unspecified: Secondary | ICD-10-CM | POA: Diagnosis not present

## 2019-03-13 DIAGNOSIS — F1010 Alcohol abuse, uncomplicated: Secondary | ICD-10-CM | POA: Diagnosis present

## 2019-03-13 DIAGNOSIS — I70239 Atherosclerosis of native arteries of right leg with ulceration of unspecified site: Secondary | ICD-10-CM | POA: Diagnosis not present

## 2019-03-13 DIAGNOSIS — J189 Pneumonia, unspecified organism: Secondary | ICD-10-CM | POA: Diagnosis not present

## 2019-03-13 DIAGNOSIS — Z960 Presence of urogenital implants: Secondary | ICD-10-CM | POA: Diagnosis not present

## 2019-03-13 DIAGNOSIS — N390 Urinary tract infection, site not specified: Secondary | ICD-10-CM | POA: Diagnosis not present

## 2019-03-13 DIAGNOSIS — R4182 Altered mental status, unspecified: Secondary | ICD-10-CM | POA: Diagnosis not present

## 2019-03-13 DIAGNOSIS — I10 Essential (primary) hypertension: Secondary | ICD-10-CM | POA: Diagnosis present

## 2019-03-13 DIAGNOSIS — Z7982 Long term (current) use of aspirin: Secondary | ICD-10-CM | POA: Diagnosis not present

## 2019-03-13 DIAGNOSIS — I70203 Unspecified atherosclerosis of native arteries of extremities, bilateral legs: Secondary | ICD-10-CM | POA: Diagnosis not present

## 2019-03-13 DIAGNOSIS — E860 Dehydration: Secondary | ICD-10-CM | POA: Diagnosis present

## 2019-03-13 DIAGNOSIS — I739 Peripheral vascular disease, unspecified: Secondary | ICD-10-CM | POA: Diagnosis present

## 2019-03-13 DIAGNOSIS — I7409 Other arterial embolism and thrombosis of abdominal aorta: Secondary | ICD-10-CM | POA: Diagnosis present

## 2019-03-13 DIAGNOSIS — D72829 Elevated white blood cell count, unspecified: Secondary | ICD-10-CM | POA: Diagnosis not present

## 2019-03-13 DIAGNOSIS — I998 Other disorder of circulatory system: Secondary | ICD-10-CM | POA: Diagnosis not present

## 2019-03-13 DIAGNOSIS — Z20828 Contact with and (suspected) exposure to other viral communicable diseases: Secondary | ICD-10-CM | POA: Diagnosis present

## 2019-03-13 DIAGNOSIS — N400 Enlarged prostate without lower urinary tract symptoms: Secondary | ICD-10-CM | POA: Diagnosis present

## 2019-03-13 DIAGNOSIS — N3000 Acute cystitis without hematuria: Secondary | ICD-10-CM | POA: Diagnosis present

## 2019-03-13 DIAGNOSIS — L97929 Non-pressure chronic ulcer of unspecified part of left lower leg with unspecified severity: Secondary | ICD-10-CM | POA: Diagnosis not present

## 2019-03-13 DIAGNOSIS — D649 Anemia, unspecified: Secondary | ICD-10-CM | POA: Diagnosis present

## 2019-03-13 DIAGNOSIS — N4889 Other specified disorders of penis: Secondary | ICD-10-CM | POA: Diagnosis not present

## 2019-03-13 DIAGNOSIS — L89152 Pressure ulcer of sacral region, stage 2: Secondary | ICD-10-CM | POA: Diagnosis present

## 2019-03-13 DIAGNOSIS — L97919 Non-pressure chronic ulcer of unspecified part of right lower leg with unspecified severity: Secondary | ICD-10-CM | POA: Diagnosis not present

## 2019-03-13 DIAGNOSIS — R825 Elevated urine levels of drugs, medicaments and biological substances: Secondary | ICD-10-CM | POA: Diagnosis not present

## 2019-03-13 DIAGNOSIS — Z9889 Other specified postprocedural states: Secondary | ICD-10-CM | POA: Diagnosis not present

## 2019-03-13 DIAGNOSIS — Z7189 Other specified counseling: Secondary | ICD-10-CM

## 2019-03-13 DIAGNOSIS — I700 Atherosclerosis of aorta: Secondary | ICD-10-CM | POA: Diagnosis not present

## 2019-03-13 DIAGNOSIS — E46 Unspecified protein-calorie malnutrition: Secondary | ICD-10-CM | POA: Diagnosis not present

## 2019-03-13 DIAGNOSIS — I70249 Atherosclerosis of native arteries of left leg with ulceration of unspecified site: Secondary | ICD-10-CM | POA: Diagnosis not present

## 2019-03-13 DIAGNOSIS — R410 Disorientation, unspecified: Secondary | ICD-10-CM | POA: Diagnosis not present

## 2019-03-13 DIAGNOSIS — Z978 Presence of other specified devices: Secondary | ICD-10-CM | POA: Diagnosis not present

## 2019-03-13 HISTORY — PX: LOWER EXTREMITY ANGIOGRAPHY: CATH118251

## 2019-03-13 LAB — CBC WITH DIFFERENTIAL/PLATELET
Abs Immature Granulocytes: 0.09 10*3/uL — ABNORMAL HIGH (ref 0.00–0.07)
Basophils Absolute: 0 10*3/uL (ref 0.0–0.1)
Basophils Relative: 0 %
Eosinophils Absolute: 0 10*3/uL (ref 0.0–0.5)
Eosinophils Relative: 0 %
HCT: 47.9 % (ref 39.0–52.0)
Hemoglobin: 16.7 g/dL (ref 13.0–17.0)
Immature Granulocytes: 0 %
Lymphocytes Relative: 6 %
Lymphs Abs: 1.3 10*3/uL (ref 0.7–4.0)
MCH: 34.3 pg — ABNORMAL HIGH (ref 26.0–34.0)
MCHC: 34.9 g/dL (ref 30.0–36.0)
MCV: 98.4 fL (ref 80.0–100.0)
Monocytes Absolute: 1.7 10*3/uL — ABNORMAL HIGH (ref 0.1–1.0)
Monocytes Relative: 8 %
Neutro Abs: 18.4 10*3/uL — ABNORMAL HIGH (ref 1.7–7.7)
Neutrophils Relative %: 86 %
Platelets: 478 10*3/uL — ABNORMAL HIGH (ref 150–400)
RBC: 4.87 MIL/uL (ref 4.22–5.81)
RDW: 13.8 % (ref 11.5–15.5)
WBC: 21.5 10*3/uL — ABNORMAL HIGH (ref 4.0–10.5)
nRBC: 0 % (ref 0.0–0.2)

## 2019-03-13 LAB — URINE DRUG SCREEN, QUALITATIVE (ARMC ONLY)
Amphetamines, Ur Screen: NOT DETECTED
Barbiturates, Ur Screen: NOT DETECTED
Benzodiazepine, Ur Scrn: POSITIVE — AB
Cannabinoid 50 Ng, Ur ~~LOC~~: NOT DETECTED
Cocaine Metabolite,Ur ~~LOC~~: NOT DETECTED
MDMA (Ecstasy)Ur Screen: NOT DETECTED
Methadone Scn, Ur: NOT DETECTED
Opiate, Ur Screen: NOT DETECTED
Phencyclidine (PCP) Ur S: NOT DETECTED
Tricyclic, Ur Screen: NOT DETECTED

## 2019-03-13 LAB — URINALYSIS, ROUTINE W REFLEX MICROSCOPIC
Bacteria, UA: NONE SEEN
Bilirubin Urine: NEGATIVE
Glucose, UA: NEGATIVE mg/dL
Ketones, ur: 20 mg/dL — AB
Nitrite: POSITIVE — AB
Protein, ur: 100 mg/dL — AB
Specific Gravity, Urine: 1.013 (ref 1.005–1.030)
Squamous Epithelial / HPF: NONE SEEN (ref 0–5)
WBC, UA: >50 WBC/hpf — ABNORMAL HIGH (ref 0–5)
pH: 6 (ref 5.0–8.0)

## 2019-03-13 LAB — COMPREHENSIVE METABOLIC PANEL
ALT: 29 U/L (ref 0–44)
AST: 86 U/L — ABNORMAL HIGH (ref 15–41)
Albumin: 4.4 g/dL (ref 3.5–5.0)
Alkaline Phosphatase: 87 U/L (ref 38–126)
Anion gap: 15 (ref 5–15)
BUN: 18 mg/dL (ref 6–20)
CO2: 21 mmol/L — ABNORMAL LOW (ref 22–32)
Calcium: 10.5 mg/dL — ABNORMAL HIGH (ref 8.9–10.3)
Chloride: 110 mmol/L (ref 98–111)
Creatinine, Ser: 0.72 mg/dL (ref 0.61–1.24)
GFR calc Af Amer: >60 mL/min (ref >60)
GFR calc non Af Amer: >60 mL/min (ref >60)
Glucose, Bld: 140 mg/dL — ABNORMAL HIGH (ref 70–99)
Potassium: 3.1 mmol/L — ABNORMAL LOW (ref 3.5–5.1)
Sodium: 146 mmol/L — ABNORMAL HIGH (ref 135–145)
Total Bilirubin: 1.8 mg/dL — ABNORMAL HIGH (ref 0.3–1.2)
Total Protein: 9.8 g/dL — ABNORMAL HIGH (ref 6.5–8.1)

## 2019-03-13 LAB — PROTIME-INR
INR: 1.1 (ref 0.8–1.2)
Prothrombin Time: 14 seconds (ref 11.4–15.2)

## 2019-03-13 LAB — MRSA PCR SCREENING: MRSA by PCR: POSITIVE — AB

## 2019-03-13 LAB — APTT: aPTT: 31 seconds (ref 24–36)

## 2019-03-13 LAB — SARS CORONAVIRUS 2 BY RT PCR (HOSPITAL ORDER, PERFORMED IN ~~LOC~~ HOSPITAL LAB): SARS Coronavirus 2: NEGATIVE

## 2019-03-13 LAB — LACTIC ACID, PLASMA
Lactic Acid, Venous: 2.3 mmol/L (ref 0.5–1.9)
Lactic Acid, Venous: 2.3 mmol/L (ref 0.5–1.9)

## 2019-03-13 LAB — ETHANOL: Alcohol, Ethyl (B): <10 mg/dL (ref <10)

## 2019-03-13 LAB — HEPARIN LEVEL (UNFRACTIONATED): Heparin Unfractionated: 0.14 IU/mL — ABNORMAL LOW (ref 0.30–0.70)

## 2019-03-13 SURGERY — LOWER EXTREMITY ANGIOGRAPHY
Anesthesia: Moderate Sedation | Laterality: Left

## 2019-03-13 MED ORDER — MIDAZOLAM HCL 2 MG/2ML IJ SOLN
INTRAMUSCULAR | Status: AC
  Filled 2019-03-13: qty 4

## 2019-03-13 MED ORDER — DIPHENHYDRAMINE HCL 50 MG/ML IJ SOLN: 50.0000 mg | Freq: Once | INTRAMUSCULAR | Status: DC | PRN

## 2019-03-13 MED ORDER — HEPARIN SODIUM (PORCINE) 1000 UNIT/ML IJ SOLN
INTRAMUSCULAR | Status: AC
  Filled 2019-03-13: qty 1

## 2019-03-13 MED ORDER — SODIUM CHLORIDE 0.9 % IV SOLN
2.0000 g | Freq: Three times a day (TID) | INTRAVENOUS | Status: DC
  Administered 2019-03-13 – 2019-03-15 (×5): 2 g via INTRAVENOUS
  Filled 2019-03-13 (×6): qty 2

## 2019-03-13 MED ORDER — MIDAZOLAM HCL 2 MG/ML PO SYRP: 8.0000 mg | ORAL_SOLUTION | Freq: Once | ORAL | Status: DC | PRN

## 2019-03-13 MED ORDER — SODIUM CHLORIDE 0.9% FLUSH
10.0000 mL | INTRAVENOUS | Status: DC | PRN
  Administered 2019-03-18: 10 mL
  Filled 2019-03-13: qty 40

## 2019-03-13 MED ORDER — METOPROLOL SUCCINATE ER 50 MG PO TB24
50.0000 mg | ORAL_TABLET | Freq: Every day | ORAL | Status: DC
  Administered 2019-03-14 – 2019-03-18 (×4): 50 mg via ORAL
  Filled 2019-03-13 (×4): qty 1

## 2019-03-13 MED ORDER — FENTANYL CITRATE (PF) 100 MCG/2ML IJ SOLN
INTRAMUSCULAR | Status: DC | PRN
  Administered 2019-03-13 (×2): 50 ug via INTRAVENOUS

## 2019-03-13 MED ORDER — POTASSIUM CHLORIDE 20 MEQ PO PACK
40.0000 meq | PACK | Freq: Two times a day (BID) | ORAL | Status: DC
  Administered 2019-03-13: 40 meq via ORAL
  Filled 2019-03-13: qty 2

## 2019-03-13 MED ORDER — LABETALOL HCL 5 MG/ML IV SOLN
10.0000 mg | INTRAVENOUS | Status: DC | PRN
  Administered 2019-03-13: 14:00:00 10 mg via INTRAVENOUS
  Filled 2019-03-13: qty 4

## 2019-03-13 MED ORDER — HEPARIN (PORCINE) 25000 UT/250ML-% IV SOLN
INTRAVENOUS | Status: AC
  Administered 2019-03-13: 18:00:00 750 [IU]/h via INTRAVENOUS
  Filled 2019-03-13: qty 250

## 2019-03-13 MED ORDER — SODIUM CHLORIDE 0.9 % IV SOLN
2.0000 g | Freq: Once | INTRAVENOUS | Status: AC
  Administered 2019-03-13: 2 g via INTRAVENOUS
  Filled 2019-03-13: qty 2

## 2019-03-13 MED ORDER — IPRATROPIUM-ALBUTEROL 0.5-2.5 (3) MG/3ML IN SOLN
3.0000 mL | Freq: Four times a day (QID) | RESPIRATORY_TRACT | Status: DC
  Administered 2019-03-13 – 2019-03-14 (×4): 3 mL via RESPIRATORY_TRACT
  Filled 2019-03-13 (×3): qty 3

## 2019-03-13 MED ORDER — AMLODIPINE BESYLATE 10 MG PO TABS
10.0000 mg | ORAL_TABLET | Freq: Every day | ORAL | Status: DC
  Administered 2019-03-13 – 2019-03-24 (×11): 10 mg via ORAL
  Filled 2019-03-13 (×10): qty 1
  Filled 2019-03-13: qty 2
  Filled 2019-03-13: qty 1

## 2019-03-13 MED ORDER — TAMSULOSIN HCL 0.4 MG PO CAPS
0.4000 mg | ORAL_CAPSULE | Freq: Every day | ORAL | Status: DC
  Administered 2019-03-16 – 2019-03-23 (×7): 0.4 mg via ORAL
  Filled 2019-03-13 (×9): qty 1

## 2019-03-13 MED ORDER — ASPIRIN EC 81 MG PO TBEC
81.0000 mg | DELAYED_RELEASE_TABLET | Freq: Every day | ORAL | Status: DC
  Administered 2019-03-14 – 2019-03-24 (×10): 81 mg via ORAL
  Filled 2019-03-13 (×10): qty 1

## 2019-03-13 MED ORDER — ENSURE ENLIVE PO LIQD
237.0000 mL | Freq: Two times a day (BID) | ORAL | Status: DC
  Administered 2019-03-14: 237 mL via ORAL

## 2019-03-13 MED ORDER — MIDAZOLAM HCL 2 MG/2ML IJ SOLN
INTRAMUSCULAR | Status: DC | PRN
  Administered 2019-03-13 (×2): 2 mg via INTRAVENOUS

## 2019-03-13 MED ORDER — THIAMINE HCL 100 MG/ML IJ SOLN: 100.0000 mg | Freq: Every day | INTRAMUSCULAR | Status: DC

## 2019-03-13 MED ORDER — HYDROMORPHONE HCL 1 MG/ML IJ SOLN
1.0000 mg | Freq: Once | INTRAMUSCULAR | Status: AC | PRN
  Administered 2019-03-17: 1 mg via INTRAVENOUS
  Filled 2019-03-13: qty 1

## 2019-03-13 MED ORDER — FENTANYL CITRATE (PF) 100 MCG/2ML IJ SOLN
INTRAMUSCULAR | Status: AC
  Filled 2019-03-13: qty 2

## 2019-03-13 MED ORDER — CLONIDINE HCL 0.1 MG PO TABS
0.1000 mg | ORAL_TABLET | Freq: Two times a day (BID) | ORAL | Status: DC
  Administered 2019-03-13 – 2019-03-24 (×20): 0.1 mg via ORAL
  Filled 2019-03-13 (×21): qty 1

## 2019-03-13 MED ORDER — METRONIDAZOLE IN NACL 5-0.79 MG/ML-% IV SOLN
500.0000 mg | Freq: Once | INTRAVENOUS | Status: AC
  Administered 2019-03-13: 12:00:00 500 mg via INTRAVENOUS
  Filled 2019-03-13: qty 100

## 2019-03-13 MED ORDER — LORAZEPAM 2 MG/ML IJ SOLN
1.0000 mg | Freq: Once | INTRAMUSCULAR | Status: AC
  Administered 2019-03-13: 1 mg via INTRAVENOUS
  Filled 2019-03-13: qty 1

## 2019-03-13 MED ORDER — FOLIC ACID 1 MG PO TABS
1.0000 mg | ORAL_TABLET | Freq: Every day | ORAL | Status: DC
  Administered 2019-03-14 – 2019-03-24 (×10): 1 mg via ORAL
  Filled 2019-03-13 (×10): qty 1

## 2019-03-13 MED ORDER — CHLORHEXIDINE GLUCONATE CLOTH 2 % EX PADS
6.0000 | MEDICATED_PAD | Freq: Every day | CUTANEOUS | Status: AC
  Administered 2019-03-14 – 2019-03-18 (×5): 6 via TOPICAL

## 2019-03-13 MED ORDER — LORAZEPAM 1 MG PO TABS
1.0000 mg | ORAL_TABLET | Freq: Four times a day (QID) | ORAL | Status: AC | PRN
  Administered 2019-03-14: 1 mg via ORAL
  Filled 2019-03-13: qty 1

## 2019-03-13 MED ORDER — VANCOMYCIN HCL IN DEXTROSE 1-5 GM/200ML-% IV SOLN
1000.0000 mg | Freq: Once | INTRAVENOUS | Status: AC
  Administered 2019-03-13: 12:00:00 1000 mg via INTRAVENOUS
  Filled 2019-03-13: qty 200

## 2019-03-13 MED ORDER — SODIUM CHLORIDE 0.9 % IV BOLUS (SEPSIS)
500.0000 mL | Freq: Once | INTRAVENOUS | Status: AC
  Administered 2019-03-13: 500 mL via INTRAVENOUS

## 2019-03-13 MED ORDER — MUPIROCIN 2 % EX OINT
1.0000 "application " | TOPICAL_OINTMENT | Freq: Two times a day (BID) | CUTANEOUS | Status: AC
  Administered 2019-03-13 – 2019-03-18 (×9): 1 via NASAL
  Filled 2019-03-13: qty 22

## 2019-03-13 MED ORDER — METHYLPREDNISOLONE SODIUM SUCC 125 MG IJ SOLR: 125.0000 mg | Freq: Once | INTRAMUSCULAR | Status: DC | PRN

## 2019-03-13 MED ORDER — FAMOTIDINE 20 MG PO TABS: 40.0000 mg | ORAL_TABLET | Freq: Once | ORAL | Status: DC | PRN

## 2019-03-13 MED ORDER — IPRATROPIUM-ALBUTEROL 0.5-2.5 (3) MG/3ML IN SOLN
RESPIRATORY_TRACT | Status: AC
  Administered 2019-03-13: 3 mL via RESPIRATORY_TRACT
  Filled 2019-03-13: qty 3

## 2019-03-13 MED ORDER — VITAMIN B-1 100 MG PO TABS
100.0000 mg | ORAL_TABLET | Freq: Every day | ORAL | Status: DC
  Administered 2019-03-14: 100 mg via ORAL
  Filled 2019-03-13: qty 1

## 2019-03-13 MED ORDER — HEPARIN BOLUS VIA INFUSION
2500.0000 [IU] | Freq: Once | INTRAVENOUS | Status: DC
  Filled 2019-03-13: qty 2500

## 2019-03-13 MED ORDER — LORAZEPAM 2 MG/ML IJ SOLN
1.0000 mg | Freq: Four times a day (QID) | INTRAMUSCULAR | Status: AC | PRN
  Administered 2019-03-14 – 2019-03-15 (×2): 1 mg via INTRAVENOUS
  Filled 2019-03-13 (×2): qty 1

## 2019-03-13 MED ORDER — ENOXAPARIN SODIUM 40 MG/0.4ML ~~LOC~~ SOLN: 40.0000 mg | SUBCUTANEOUS | Status: DC

## 2019-03-13 MED ORDER — IODIXANOL 320 MG/ML IV SOLN
INTRAVENOUS | Status: DC | PRN
  Administered 2019-03-13: 70 mL via INTRAVENOUS

## 2019-03-13 MED ORDER — ADULT MULTIVITAMIN W/MINERALS CH
1.0000 | ORAL_TABLET | Freq: Every day | ORAL | Status: DC
  Administered 2019-03-14 – 2019-03-24 (×10): 1 via ORAL
  Filled 2019-03-13 (×10): qty 1

## 2019-03-13 MED ORDER — THIAMINE HCL 100 MG/ML IJ SOLN
Freq: Once | INTRAVENOUS | Status: AC
  Administered 2019-03-13: 13:00:00 via INTRAVENOUS
  Filled 2019-03-13: qty 1000

## 2019-03-13 MED ORDER — ONDANSETRON HCL 4 MG/2ML IJ SOLN: 4.0000 mg | Freq: Four times a day (QID) | INTRAMUSCULAR | Status: DC | PRN

## 2019-03-13 MED ORDER — HEPARIN SODIUM (PORCINE) 1000 UNIT/ML IJ SOLN
INTRAMUSCULAR | Status: DC | PRN
  Administered 2019-03-13: 4000 [IU] via INTRAVENOUS

## 2019-03-13 MED ORDER — HEPARIN (PORCINE) 25000 UT/250ML-% IV SOLN
900.0000 [IU]/h | INTRAVENOUS | Status: DC
  Administered 2019-03-13: 18:00:00 750 [IU]/h via INTRAVENOUS

## 2019-03-13 MED ORDER — LACTATED RINGERS IV BOLUS
1000.0000 mL | Freq: Once | INTRAVENOUS | Status: AC
  Administered 2019-03-13: 1000 mL via INTRAVENOUS

## 2019-03-13 MED ORDER — MEGESTROL ACETATE 20 MG PO TABS
80.0000 mg | ORAL_TABLET | Freq: Every day | ORAL | Status: DC
  Filled 2019-03-13: qty 8

## 2019-03-13 MED ORDER — VANCOMYCIN HCL IN DEXTROSE 750-5 MG/150ML-% IV SOLN
750.0000 mg | Freq: Two times a day (BID) | INTRAVENOUS | Status: DC
  Administered 2019-03-13 – 2019-03-14 (×2): 750 mg via INTRAVENOUS
  Filled 2019-03-13 (×3): qty 150

## 2019-03-13 MED ORDER — SODIUM CHLORIDE 0.9 % IV SOLN
INTRAVENOUS | Status: DC
  Administered 2019-03-13 – 2019-03-14 (×3): via INTRAVENOUS

## 2019-03-13 SURGICAL SUPPLY — 16 items
BALLN DORADO 6X80X80 (BALLOONS) ×6
BALLN LUTONIX 6X150X130 (BALLOONS) ×6
BALLOON DORADO 6X80X80 (BALLOONS) ×2 IMPLANT
BALLOON LUTONIX 6X150X130 (BALLOONS) ×2 IMPLANT
CANNULA 5F STIFF (CANNULA) ×3 IMPLANT
CATH BEACON 5 .035 65 KMP TIP (CATHETERS) ×3 IMPLANT
CATH PIG 70CM (CATHETERS) ×3 IMPLANT
DEVICE PRESTO INFLATION (MISCELLANEOUS) ×6 IMPLANT
GLIDEWIRE ADV .035X180CM (WIRE) ×3 IMPLANT
PACK ANGIOGRAPHY (CUSTOM PROCEDURE TRAY) ×3 IMPLANT
SHEATH BRITE TIP 5FRX11 (SHEATH) ×6 IMPLANT
SHEATH BRITE TIP 6FRX11 (SHEATH) ×6 IMPLANT
SYR MEDRAD MARK 7 150ML (SYRINGE) ×3 IMPLANT
TUBING CONTRAST HIGH PRESS 72 (TUBING) ×3 IMPLANT
WIRE J 3MM .035X145CM (WIRE) ×3 IMPLANT
WIRE MAGIC TOR.035 180C (WIRE) ×3 IMPLANT

## 2019-03-13 NOTE — ED Notes (Signed)
Per pt mother, pt was up all night hallucinating, talking all night, states the pt normally uses crutches to ambulate and lives on his own, states he has been living with her since his discharge because he is unable to ambulate now. States he has been c/o BL LE pain with discoloration of his toes, states he has a hx of arterial occlusion with 7 stents placed.

## 2019-03-13 NOTE — Progress Notes (Signed)
Notified bedside nurse of need to draw lactic acid.  

## 2019-03-13 NOTE — ED Notes (Signed)
ED TO INPATIENT HANDOFF REPORT  ED Nurse Name and Phone #: Dionisio David Name/Age/Gender Donald [name]. 58 y.o. male Room/Bed: ED02A/ED02A  Code Status   Code Status: Prior  Home/SNF/Other Home (living with mother since d/c from detox) Patient oriented to: self, place, time and situation Is this baseline? Yes   Triage Complete: Triage complete  Chief Complaint AMS  Triage Note Pt comes into the ED via EMS from home, EMS reports pt lives with his mother and was here last week for detox, states he has been having hallucinations, not eating well .    Allergies No Known Allergies  Level of Care/Admitting Diagnosis ED Disposition    ED Disposition Condition Comment   Admit  The patient appears reasonably stabilized for admission considering the current resources, flow, and capabilities available in the ED at this time, and I doubt any other [facility] requiring further screening and/or treatment in the ED prior to admission is  present.       B Medical/Surgery History Past Medical History:  Diagnosis Date  . Depression   . Emphysema of lung ([place])   . Essential hypertension   . History of chicken pox   . Low weight   . Neuropathy    bilateral lower legs  . Peripheral vascular disease ([place])   . Prostate disease   . Seasonal allergies   . Seizures ([place])   . Wears dentures    full upper   Past Surgical History:  Procedure Laterality Date  . CATARACT EXTRACTION W/PHACO Left 05/18/2018   Procedure: CATARACT EXTRACTION PHACO AND INTRAOCULAR LENS PLACEMENT ([place]) LEFT;  Surgeon: Leandrew Koyanagi, MD;  Location: [place];  Service: Ophthalmology;  Laterality: Left;  . COLONOSCOPY WITH PROPOFOL N/A 02/25/2016   Procedure: COLONOSCOPY WITH PROPOFOL;  Surgeon: [name], MD;  Location: [facility];  Service: Endoscopy;  Laterality: N/A;  . EYE SURGERY  2015   Cataract  . VASCULAR SURGERY     "7 stents in legs"     A IV Location/Drains/Wounds Patient  Lines/Drains/Airways Status   Active Line/Drains/Airways    Name:   Placement date:   Placement time:   Site:   Days:   Peripheral IV 03/13/19 Right Forearm   03/13/19    0940    Forearm   less than 1   Peripheral IV 03/13/19 Left Antecubital   03/13/19    1044    Antecubital   less than 1   Incision (Closed) 05/18/18 Eye Left   05/18/18    1129     299   Pressure Injury 03/05/19 Coccyx Mid Stage II -  Partial thickness loss of dermis presenting as a shallow open ulcer with a red, pink wound bed without slough.   03/05/19    0340     8          Intake/Output Last 24 hours No intake or output data in the 24 hours ending 03/13/19 1147  Labs/Imaging Results for orders placed or performed during the hospital encounter of 03/13/19 (from the past 48 hour(s))  CBC with Differential     Status: Abnormal   Collection Time: 03/13/19  9:31 AM  Result Value Ref Range   WBC 21.5 (H) 4.0 - 10.5 K/uL   RBC 4.87 4.22 - 5.81 MIL/uL   Hemoglobin 16.7 13.0 - 17.0 g/dL   HCT 47.9 39.0 - 52.0 %   MCV 98.4 80.0 - 100.0 fL   MCH 34.3 (H) 26.0 - 34.0  pg   MCHC 34.9 30.0 - 36.0 g/dL   RDW 13.8 11.5 - 15.5 %   Platelets 478 (H) 150 - 400 K/uL   nRBC 0.0 0.0 - 0.2 %   Neutrophils Relative % 86 %   Neutro Abs 18.4 (H) 1.7 - 7.7 K/uL   Lymphocytes Relative 6 %   Lymphs Abs 1.3 0.7 - 4.0 K/uL   Monocytes Relative 8 %   Monocytes Absolute 1.7 (H) 0.1 - 1.0 K/uL   Eosinophils Relative 0 %   Eosinophils Absolute 0.0 0.0 - 0.5 K/uL   Basophils Relative 0 %   Basophils Absolute 0.0 0.0 - 0.1 K/uL   Immature Granulocytes 0 %   Abs Immature Granulocytes 0.09 (H) 0.00 - 0.07 K/uL    Comment: Performed at [facility], [address] [place], [place] [address]  Comprehensive metabolic panel     Status: Abnormal   Collection Time: 03/13/19  9:31 AM  Result Value Ref Range   Sodium 146 (H) 135 - 145 mmol/L   Potassium 3.1 (L) 3.5 - 5.1 mmol/L   Chloride 110 98 - 111 mmol/L   CO2 21 (L) 22 - 32  mmol/L   Glucose, Bld 140 (H) 70 - 99 mg/dL   BUN 18 6 - 20 mg/dL   Creatinine, Ser 0.72 0.61 - 1.24 mg/dL   Calcium 10.5 (H) 8.9 - 10.3 mg/dL   Total Protein 9.8 (H) 6.5 - 8.1 g/dL   Albumin 4.4 3.5 - 5.0 g/dL   AST 86 (H) 15 - 41 U/L   ALT 29 0 - 44 U/L   Alkaline Phosphatase 87 38 - 126 U/L   Total Bilirubin 1.8 (H) 0.3 - 1.2 mg/dL   GFR calc non Af Amer >60 >60 mL/min   GFR calc Af Amer >60 >60 mL/min   Anion gap 15 5 - 15    Comment: Performed at [facility], [address] [place], [place] [address]  Urinalysis, Routine w reflex microscopic     Status: Abnormal   Collection Time: 03/13/19  9:56 AM  Result Value Ref Range   Color, Urine YELLOW (A) YELLOW   APPearance HAZY (A) CLEAR   Specific Gravity, Urine 1.013 1.005 - 1.030   pH 6.0 5.0 - 8.0   Glucose, UA NEGATIVE NEGATIVE mg/dL   Hgb urine dipstick MODERATE (A) NEGATIVE   Bilirubin Urine NEGATIVE NEGATIVE   Ketones, ur 20 (A) NEGATIVE mg/dL   Protein, ur 100 (A) NEGATIVE mg/dL   Nitrite POSITIVE (A) NEGATIVE   Leukocytes,Ua SMALL (A) NEGATIVE   RBC / HPF 0-5 0 - 5 RBC/hpf   WBC, UA >50 (H) 0 - 5 WBC/hpf   Bacteria, UA NONE SEEN NONE SEEN   Squamous Epithelial / LPF NONE SEEN 0 - 5   Mucus PRESENT     Comment: Performed at [facility], [address], [place],  [address]  SARS Coronavirus 2 (CEPHEID - Performed in [place] hospital lab), Hosp Order     Status: None   Collection Time: 03/13/19  9:56 AM   Specimen: Nasopharyngeal Swab  Result Value Ref Range   SARS Coronavirus 2 NEGATIVE NEGATIVE    Comment: (NOTE) If result is NEGATIVE SARS-CoV-2 target nucleic acids are NOT DETECTED. The SARS-CoV-2 RNA is generally detectable in upper and lower  respiratory specimens during the acute phase of infection. The lowest  concentration of SARS-CoV-2 viral copies this assay can detect is 250  copies / mL. A negative  result does not preclude SARS-CoV-2 infection  and should not be used  as the sole basis for treatment or other  patient management decisions.  A negative result may occur with  improper specimen collection / handling, submission of specimen other  than nasopharyngeal swab, presence of viral mutation(s) within the  areas targeted by this assay, and inadequate number of viral copies  (<250 copies / mL). A negative result must be combined with clinical  observations, patient history, and epidemiological information. If result is POSITIVE SARS-CoV-2 target nucleic acids are DETECTED. The SARS-CoV-2 RNA is generally detectable in upper and lower  respiratory specimens dur ing the acute phase of infection.  Positive  results are indicative of active infection with SARS-CoV-2.  Clinical  correlation with patient history and other diagnostic information is  necessary to determine patient infection status.  Positive results do  not rule out bacterial infection or co-infection with other viruses. If result is PRESUMPTIVE POSTIVE SARS-CoV-2 nucleic acids MAY BE PRESENT.   A presumptive positive result was obtained on the submitted specimen  and confirmed on repeat testing.  While 2019 novel coronavirus  (SARS-CoV-2) nucleic acids may be present in the submitted sample  additional confirmatory testing may be necessary for epidemiological  and / or clinical management purposes  to differentiate between  SARS-CoV-2 and other Sarbecovirus currently known to infect humans.  If clinically indicated additional testing with an alternate test  methodology [phone]) is advised. The SARS-CoV-2 RNA is generally  detectable in upper and lower respiratory sp ecimens during the acute  phase of infection. The expected result is Negative. Fact Sheet for Patients:  StrictlyIdeas.no Fact Sheet for Healthcare Providers: BankingDealers.co.za This test is not yet approved or cleared by the [place] FDA and has been authorized for  detection and/or diagnosis of SARS-CoV-2 by FDA under an Emergency Use Authorization (EUA).  This EUA will remain in effect (meaning this test can be used) for the duration of the COVID-19 declaration under Section 564(b)(1) of the Act, 21 U.S.C. section 360bbb-3(b)(1), unless the authorization is terminated or revoked sooner. Performed at [facility], [address] [place], [place] [address]   Ethanol     Status: None   Collection Time: 03/13/19  9:56 AM  Result Value Ref Range   Alcohol, Ethyl (B) <10 <10 mg/dL    Comment: (NOTE) Lowest detectable limit for serum alcohol is 10 mg/dL. For medical purposes only. Performed at [facility], [address] [place], [place] [address]   Protime-INR     Status: None   Collection Time: 03/13/19  9:56 AM  Result Value Ref Range   Prothrombin Time 14.0 11.4 - 15.2 seconds   INR 1.1 0.8 - 1.2    Comment: (NOTE) INR goal varies based on device and disease states. Performed at [facility], [address] [place], [place] [address]   APTT     Status: None   Collection Time: 03/13/19  9:56 AM  Result Value Ref Range   aPTT 31 24 - 36 seconds    Comment: Performed at [facility], [place], [place] [address]  Lactic acid, plasma     Status: Abnormal   Collection Time: 03/13/19 10:39 AM  Result Value Ref Range   Lactic Acid, Venous 2.3 (HH) 0.5 - 1.9 mmol/L    Comment: CRITICAL RESULT CALLED TO, READ BACK BY AND VERIFIED WITH Marlee Armenteros AT 1109 ON 03/13/2019 [place]. Performed at [facility], [place]  [address] [place], [place] [address]    Dg Chest Portable 1 View  Result Date: 03/13/2019 CLINICAL DATA:  Pt comes into the ED via EMS from home, EMS reports pt lives with his mother and was here last week for detox, states he has been having hallucinations, not eating well ; Tachycardia Former smoker EXAM: PORTABLE CHEST 1 VIEW COMPARISON:  06/15/2016, 11/18/2011 and CT of  the chest on 02/15/2014 FINDINGS: Heart size is normal. The lungs are hyperinflated. There is atherosclerotic calcification of the thoracic aorta. There is perihilar peribronchial thickening. Stable scarring in the RIGHT UPPER lobe. There is increased streaky opacity in the MEDIAL LEFT lung base, consistent with developing infiltrate or significant atelectasis. There is no pulmonary edema. IMPRESSION: 1. Hyperinflation and bronchitic changes. 2. Developing atelectasis or infiltrate in the MEDIAL LEFT lung base. Electronically Signed   By: Nolon Nations M.D.   On: 03/13/2019 09:55    Pending Labs Unresulted Labs (From admission, onward)    Start     Ordered   03/13/19 1012  Lactic acid, plasma  Now then every 2 hours,   STAT     03/13/19 1012   03/13/19 1012  Blood Culture (routine x 2)  BLOOD CULTURE X 2,   STAT     03/13/19 1012   03/13/19 0934  Urine culture  ONCE - STAT,   STAT     03/13/19 0934          Vitals/Pain Today's Vitals   03/13/19 1000 03/13/19 1030 03/13/19 1100 03/13/19 1130  BP: (!) 189/136 (!) 175/144 (!) 186/117 (!) 156/112  Pulse: (!) 138 (!) 131 (!) 144 (!) 124  Resp: (!) 22 (!) 33 (!) 24 (!) 28  Temp:      TempSrc:      SpO2: 98% 96% 91% 98%  Weight:      Height:      PainSc:        Isolation Precautions No active isolations  Medications Medications  metroNIDAZOLE (FLAGYL) IVPB 500 mg (500 mg Intravenous New Bag/Given 03/13/19 1140)  vancomycin (VANCOCIN) IVPB 1000 mg/200 mL premix (1,000 mg Intravenous New Bag/Given 03/13/19 1142)  lactated ringers bolus 1,000 mL (0 mLs Intravenous Stopped 03/13/19 1137)  LORazepam (ATIVAN) injection 1 mg (1 mg Intravenous Given 03/13/19 0943)  ceFEPIme (MAXIPIME) 2 g in sodium chloride 0.9 % 100 mL IVPB (0 g Intravenous Stopped 03/13/19 1139)  sodium chloride 0.9 % bolus 500 mL (500 mLs Intravenous New Bag/Given 03/13/19 1111)    Mobility walks with person assist (baseline is with cane) Low fall risk   Focused  Assessments Cardiac Assessment Handoff:  Cardiac Rhythm: Sinus tachycardia No results found for: CKTOTAL, CKMB, CKMBINDEX, TROPONINI No results found for: DDIMER Does the Patient currently have chest pain? No      R Recommendations: See Admitting Provider Note  Report given to:   Additional Notes: significant skin breakdown

## 2019-03-13 NOTE — ED Notes (Signed)
Charge on accepting floor called and updated on pt's blood pressure/HR

## 2019-03-13 NOTE — Consult Note (Signed)
[facility] Vascular Consult Note  MRN : [id number]  Donald [name]. is a 58 y.o. (November 02, 1960) male who presents with chief complaint of  Chief Complaint  Patient presents with  . Altered Mental Status   History of Present Illness: The patient is a 58 year old male with a past medical history of hypertension, BPH, former tobacco abuse, peripheral neuropathy, emphysema, alcohol dependence in remission, sensory ataxia, history of colon cancer who presented to [facility] emergency department via EMS.    The patient's mother called EMS due to to the patient being "disoriented" and experiencing "hallucinations".  Concerns over poor PO intake and urinary incontinence x3 days also prompted the patient's mother to seek medical attention for her son.  Patient was recently admitted approximately one week ago for "detox".  Patient's mother notes significant decline since his discharge.  As per the patient, he states that he had 7 stents placed in his leg years ago.  He does not remember which leg.  He does complain of bilateral lower extremity pain with discoloration of the toes to the left foot.  He denies any fever, nausea vomiting.  Denies any shortness of breath or chest pain.  Patient was found to have urosepsis and admitted.   Vascular surgery was consulted by NP Ouma for possible lower extremity ischemia. Current Facility-Administered Medications  Medication Dose Route Frequency Provider Last Rate Last Dose  . 0.9 %  sodium chloride infusion   Intravenous Continuous Ouma, Bing Neighbors, NP      . amLODipine (NORVASC) tablet 10 mg  10 mg Oral Daily Lang Snow, NP      . Derrill Memo ON 03/14/2019] aspirin EC tablet 81 mg  81 mg Oral Daily Ouma, Bing Neighbors, NP      . cloNIDine (CATAPRES) tablet 0.1 mg  0.1 mg Oral BID Lang Snow, NP      . enoxaparin (LOVENOX) injection 40 mg  40 mg Subcutaneous Q24H Ouma, Bing Neighbors, NP      . feeding supplement (ENSURE ENLIVE) (ENSURE ENLIVE) liquid 237 mL  237 mL Oral BID BM Ouma, Bing Neighbors, NP      . folic acid (FOLVITE) tablet 1 mg  1 mg Oral Daily Ouma, Bing Neighbors, NP      . ipratropium-albuterol (DUONEB) 0.5-2.5 (3) MG/3ML nebulizer solution 3 mL  3 mL Nebulization Q6H Ouma, Bing Neighbors, NP      . LORazepam (ATIVAN) tablet 1 mg  1 mg Oral Q6H PRN Lang Snow, NP       Or  . LORazepam (ATIVAN) injection 1 mg  1 mg Intravenous Q6H PRN Lang Snow, NP      . megestrol (MEGACE) tablet 80-160 mg  80-160 mg Oral Daily Ouma, Bing Neighbors, NP      . metoprolol succinate (TOPROL-XL) 24 hr tablet 50 mg  50 mg Oral Daily Ouma, Bing Neighbors, NP      . multivitamin with minerals tablet 1 tablet  1 tablet Oral Daily Ouma, Bing Neighbors, NP      . tamsulosin (FLOMAX) capsule 0.4 mg  0.4 mg Oral QPC supper Lang Snow, NP      . thiamine (VITAMIN B-1) tablet 100 mg  100 mg Oral Daily Lang Snow, NP       Current Outpatient Medications  Medication Sig Dispense Refill  . albuterol (VENTOLIN HFA) 108 (90 Base) MCG/ACT inhaler Inhale 2 puffs into the lungs every 6 (six) hours as  needed for wheezing or shortness of breath. 8.5 g 2  . amLODipine (NORVASC) 10 MG tablet Take 1 tablet (10 mg total) by mouth daily. 30 tablet 0  . aspirin 81 MG tablet Take 1 tablet (81 mg total) by mouth daily. 30 tablet 11  . cloNIDine (CATAPRES) 0.1 MG tablet Take 1 tablet (0.1 mg total) by mouth 2 (two) times daily. 60 tablet 0  . feeding supplement, ENSURE ENLIVE, (ENSURE ENLIVE) LIQD Take 237 mLs by mouth 2 (two) times daily between meals. 14220 mL 0  . Fluticasone-Salmeterol (ADVAIR) 250-50 MCG/DOSE AEPB Inhale 1 puff into the lungs 2 (two) times daily. 60 each 11  . megestrol (MEGACE) 40 MG tablet Take 2-4 tablets (80-160 mg total) by mouth daily. Start with 2 tablets a day, then gradually increase as tolerated up  to 4 per day 120 tablet 0  . metoprolol succinate (TOPROL-XL) 50 MG 24 hr tablet Take 1 tablet (50 mg total) by mouth daily. 90 tablet 1  . tamsulosin (FLOMAX) 0.4 MG CAPS capsule Take 1 capsule (0.4 mg total) by mouth daily after supper. 90 capsule 3  . chlordiazePOXIDE (LIBRIUM) 25 MG capsule 25 mg p.o. twice daily x2 days Then 25 mg p.o. daily x2 days (Patient not taking: Reported on 03/09/2019) 6 capsule 0   Past Medical History:  Diagnosis Date  . Depression   . Emphysema of lung ([place])   . Essential hypertension   . History of chicken pox   . Low weight   . Neuropathy    bilateral lower legs  . Peripheral vascular disease ([place])   . Prostate disease   . Seasonal allergies   . Seizures ([place])   . Wears dentures    full upper   Past Surgical History:  Procedure Laterality Date  . CATARACT EXTRACTION W/PHACO Left 05/18/2018   Procedure: CATARACT EXTRACTION PHACO AND INTRAOCULAR LENS PLACEMENT ([place]) LEFT;  Surgeon: Leandrew Koyanagi, MD;  Location: [facility];  Service: Ophthalmology;  Laterality: Left;  . COLONOSCOPY WITH PROPOFOL N/A 02/25/2016   Procedure: COLONOSCOPY WITH PROPOFOL;  Surgeon: [name], MD;  Location: [facility] ENDOSCOPY;  Service: Endoscopy;  Laterality: N/A;  . EYE SURGERY  2015   Cataract  . VASCULAR SURGERY     "7 stents in legs"   Social History Social History   Tobacco Use  . Smoking status: Former Smoker    Packs/day: 0.50    Years: 35.00    Pack years: 17.50    Types: Cigarettes    Quit date: 01/25/2018    Years since quitting: 1.1  . Smokeless tobacco: Former Systems developer    Quit date: 08/31/2016  . Tobacco comment: Previously quit in 09/2016 on Chantix until 09/2017  Substance Use Topics  . Alcohol use: Not Currently    Alcohol/week: 0.0 standard drinks    Comment: occ  . Drug use: No   Family History Family History  Problem Relation Age of Onset  . Ulcers Father        bleeding  . Cancer Mother        Breast  Denies any family  history of peripheral artery disease, venous disease or renal disease.  No Known Allergies  REVIEW OF SYSTEMS (Negative unless checked)  Constitutional: [x] Weight loss  [] Fever  [] Chills Cardiac: [] Chest pain   [] Chest pressure   [] Palpitations   [] Shortness of breath when laying flat   [] Shortness of breath at rest   [] Shortness of breath with exertion. Vascular:  [] Pain in legs with  walking   [x] Pain in legs at rest   [x] Pain in legs when laying flat   [] Claudication   [] Pain in feet when walking  [] Pain in feet at rest  [] Pain in feet when laying flat   [] History of DVT   [] Phlebitis   [] Swelling in legs   [] Varicose veins   [] Non-healing ulcers Pulmonary:   [] Uses home oxygen   [] Productive cough   [] Hemoptysis   [] Wheeze  [] COPD   [] Asthma Neurologic:  [] Dizziness  [] Blackouts   [] Seizures   [] History of stroke   [] History of TIA  [] Aphasia   [] Temporary blindness   [] Dysphagia   [] Weakness or numbness in arms   [] Weakness or numbness in legs Musculoskeletal:  [] Arthritis   [] Joint swelling   [] Joint pain   [] Low back pain Hematologic:  [] Easy bruising  [] Easy bleeding   [] Hypercoagulable state   [] Anemic  [] Hepatitis Gastrointestinal:  [] Blood in stool   [] Vomiting blood  [] Gastroesophageal reflux/heartburn   [] Difficulty swallowing. Genitourinary:  [] Chronic kidney disease   [x] Difficult urination  [] Frequent urination  [] Burning with urination   [] Blood in urine Skin:  [] Rashes   [] Ulcers   [] Wounds Psychological:  [] History of anxiety   []  History of major depression.  Physical Examination  Vitals:   03/13/19 1100 03/13/19 1130 03/13/19 1237 03/13/19 1300  BP: (!) 186/117 (!) 156/112 (!) 178/111 (!) 173/110  Pulse: (!) 144 (!) 124 (!) 137 (!) 138  Resp: (!) 24 (!) 28 (!) 30 (!) 29  Temp:      TempSrc:      SpO2: 91% 98% 97% 98%  Weight:      Height:       Body mass index is 16.02 kg/m. Gen:  WD/WN, NAD Head: [place]/AT, No temporalis wasting. Prominent temp pulse not  noted. Ear/Nose/Throat: Hearing grossly intact, nares w/o erythema or drainage, oropharynx w/o Erythema/Exudate Eyes: Sclera non-icteric, conjunctiva clear Neck: Trachea midline.  No JVD.  Pulmonary:  Good air movement, respirations not labored, equal bilaterally.  Cardiac: RRR, normal S1, S2. Vascular:  Vessel Right Left  Radial Palpable Palpable  Ulnar Palpable Palpable  Brachial Palpable Palpable  Carotid Palpable, without bruit Palpable, without bruit  Aorta Not palpable N/A  Femoral Palpable Palpable  Popliteal Palpable Palpable  PT Non-Palpable Non-Palpable  DP Non-Palpable Non-Palpable   Left Lower Extremity: thigh soft, calf soft,.  Extremity is warm to approximately mid calf and becomes cooler.  Foot is cooler.  Slightly tender to palpation along the foot.  Toes with bluish-red tent.  Motor/sensory is intact.  No ulcers are noted.  Gastrointestinal: soft, non-tender/non-distended. No guarding/reflex.  Musculoskeletal: M/S 5/5 throughout.  Extremities without ischemic changes.  No deformity or atrophy. No edema. Neurologic: Sensation grossly intact in extremities.  Symmetrical.  Speech is fluent. Motor exam as listed above. Psychiatric: Judgment intact, Mood & affect appropriate for pt's clinical situation. Dermatologic: No rashes or ulcers noted.  No cellulitis or open wounds. Lymph : No Cervical, Axillary, or Inguinal lymphadenopathy.  CBC Lab Results  Component Value Date   WBC 21.5 (H) 03/13/2019   HGB 16.7 03/13/2019   HCT 47.9 03/13/2019   MCV 98.4 03/13/2019   PLT 478 (H) 03/13/2019   BMET    Component Value Date/Time   NA 146 (H) 03/13/2019 0931   NA 125 (L) 02/07/2016 1305   NA 130 (L) 11/20/2011 0510   K 3.1 (L) 03/13/2019 0931   K 4.0 11/20/2011 0510   CL 110 03/13/2019 0931   CL  98 11/20/2011 0510   CO2 21 (L) 03/13/2019 0931   CO2 18 (L) 11/20/2011 0510   GLUCOSE 140 (H) 03/13/2019 0931   GLUCOSE 82 11/20/2011 0510   BUN 18 03/13/2019 0931   BUN  5 (L) 02/07/2016 1305   BUN 1 (L) 11/20/2011 0510   CREATININE 0.72 03/13/2019 0931   CREATININE 0.93 01/05/2019 0800   CALCIUM 10.5 (H) 03/13/2019 0931   CALCIUM 8.5 11/20/2011 0510   GFRNONAA >60 03/13/2019 0931   GFRNONAA 92 01/05/2019 0800   GFRAA >60 03/13/2019 0931   GFRAA 107 01/05/2019 0800   Estimated Creatinine Clearance: 72.6 mL/min (by C-G formula based on SCr of 0.72 mg/dL).  COAG Lab Results  Component Value Date   INR 1.1 03/13/2019   Radiology Ct Head Wo Contrast  Result Date: 03/13/2019 CLINICAL DATA:  Lucent a shins, altered level of consciousness. EXAM: CT HEAD WITHOUT CONTRAST TECHNIQUE: Contiguous axial images were obtained from the base of the skull through the vertex without intravenous contrast. COMPARISON:  Head CT dated 03/04/2019. FINDINGS: Brain: Generalized parenchymal volume loss with commensurate dilatation of the ventricles and sulci. Chronic small vessel ischemic changes again noted within the bilateral periventricular and subcortical white matter regions. Old lacunar infarcts again noted within each basal ganglia region. No mass, hemorrhage, edema or other evidence of acute parenchymal abnormality. No extra-axial hemorrhage. Vascular: Chronic calcified atherosclerotic changes of the large vessels at the skull base. No unexpected hyperdense vessel. Skull: Normal. Negative for fracture or focal lesion. Sinuses/Orbits: No acute finding. Other: None. IMPRESSION: 1. No acute findings. No intracranial mass, hemorrhage or edema. 2. Atrophy and chronic ischemic changes, as detailed above. Electronically Signed   By: Franki Cabot M.D.   On: 03/13/2019 12:12   Ct Head Wo Contrast  Result Date: 03/04/2019 CLINICAL DATA:  Altered level of consciousness, heavy alcohol intake today EXAM: CT HEAD WITHOUT CONTRAST TECHNIQUE: Contiguous axial images were obtained from the base of the skull through the vertex without intravenous contrast. Sagittal and coronal MPR images  reconstructed from axial data set. COMPARISON:  05/30/2011 FINDINGS: Brain: Generalized atrophy. Normal ventricular morphology. No midline shift or mass effect. Small vessel chronic ischemic changes of deep cerebral white matter. 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 No intracranial hemorrhage, mass lesion, evidence of acute infarction, or extra-axial fluid collection. Vascular: Atherosclerotic calcifications of internal carotid and vertebral arteries at skull base Skull: Demineralized but intact Sinuses/Orbits: Clear Other: N/A IMPRESSION: Atrophy with extensive small vessel chronic ischemic changes of deep cerebral white matter, pronounced for age. 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 No acute intracranial abnormalities. Electronically Signed   By: Lavonia Dana M.D.   On: 03/04/2019 22:26   Dg Chest Portable 1 View  Result Date: 03/13/2019 CLINICAL DATA:  Pt comes into the ED via EMS from home, EMS reports pt lives with his mother and was here last week for detox, states he has been having hallucinations, not eating well ; Tachycardia Former smoker EXAM: PORTABLE CHEST 1 VIEW COMPARISON:  06/15/2016, 11/18/2011 and CT of the chest on 02/15/2014 FINDINGS: Heart size is normal. The lungs are hyperinflated. There is atherosclerotic calcification of the thoracic aorta. There is perihilar peribronchial thickening. Stable scarring in the RIGHT UPPER lobe. There is increased streaky opacity in the MEDIAL LEFT lung base, consistent with developing infiltrate or significant atelectasis. There is no pulmonary edema. IMPRESSION: 1. Hyperinflation and bronchitic changes. 2. Developing atelectasis or infiltrate  in the MEDIAL LEFT lung base. Electronically Signed   By: Nolon Nations M.D.   On: 03/13/2019 09:55   Assessment/Plan The patient is a 58 year old male with a past medical history of hypertension, BPH, former tobacco  abuse, peripheral neuropathy, emphysema, alcohol dependence in remission, sensory ataxia, history of colon cancer who presented to [facility] emergency department via EMS for altered mental status found to have urosepsis. 1.  Atherosclerotic disease to the left lower extremity: Patient has multiple risk factors for atherosclerotic disease.  As per the patient, he has 7 stents placed to 1 of his legs many years ago.  Physical exam unable to palpate pedal pulses bilaterally.  Left lower extremity cooler when compared to right.  There is a bluish/red discoloration to the left toes.  Worrisome for ischemia.  Recommend a left lower extremity angiogram with possible intervention and attempt to assess the patient's anatomy and contributing degree of peripheral artery disease.  If appropriate, an attempt to revascularize the leg can be made at that time.  Procedure, risks and benefits explained to the patient.  All questions answered.  The patient wishes to proceed.  We will plan on a left lower extremity angiogram with possible intervention with Dr. dew this afternoon.  Please keep the patient n.p.o. 2.  Urosepsis: Code sepsis initiated.  Patient is now on IV antibiotics.  Treatment as per his primary team. 3.  Former tobacco use: Reinforce the absolute need for tobacco cessation. We had a discussion for approximately three minutes regarding the absolute need for smoking cessation due to the deleterious nature of tobacco on the vascular system. We discussed the tobacco use would diminish patency of any intervention, and likely significantly worsen progressio of disease. The patient voices their understanding of the importance of smoking cessation.  Discussed with Dr. Mayme Genta, PA-C  03/13/2019 1:36 PM  This note was created with Dragon medical transcription system.  Any error is purely unintentional

## 2019-03-13 NOTE — Consult Note (Signed)
Pharmacy Antibiotic Note  Dmitri Pettigrew. is a 58 y.o. male admitted on 03/13/2019 with sepsis.  Pharmacy has been consulted for cefepime and vancomycin dosing.  Plan: Will order cefepime 2g q8H   Patient received vancomycin 1000 mg x 1 in the ED. Will order vancomycin 750 mg q12H for a predicted AUC of 420. Goal AUC is 400-550. Plan to order vancomycin level in 4-5 days.   Height: 5\' 9"  (175.3 cm) Weight: 108 lb 7.5 oz (49.2 kg) IBW/kg (Calculated) : 70.7  Temp (24hrs), Avg:98.2 F (36.8 C), Min:97.7 F (36.5 C), Max:98.6 F (37 C)  Recent Labs  Lab 03/07/19 0349 03/08/19 0435 03/13/19 0931 03/13/19 1039 03/13/19 1207  WBC 10.2 10.2 21.5*  --   --   CREATININE 0.51* 0.40* 0.72  --   --   LATICACIDVEN  --   --   --  2.3* 2.3*    Estimated Creatinine Clearance: 72.6 mL/min (by C-G formula based on SCr of 0.72 mg/dL).    No Known Allergies  Antimicrobials this admission: 7/6 cefepime >>  7/6 vancomycin >>   Dose adjustments this admission: None  Microbiology results: 7/6 BCx: pending 7/6 UCx: pending    Thank you for allowing pharmacy to be a part of this patient's care.  Oswald Hillock, PharmD, BCPS 03/13/2019 3:03 PM

## 2019-03-13 NOTE — ED Notes (Addendum)
Admitting provider contacted and given floors concerns over vitals and need for higher level of care. Providers message "No need for frequent vitals lets dc patient is appropriate for floor no need for step down or ICU"   [facility] made aware

## 2019-03-13 NOTE — Progress Notes (Signed)
Spoke with pt. Mother,  States pt. Has deteriorated physically since DC home 03/08/2019; Unable to walk at all per mother for last 2 days. Pt. Mother has been giving "full care for days now." States pt. Incontinent for last 2 days also.

## 2019-03-13 NOTE — ED Notes (Signed)
MD Jari Pigg made aware of pt's elevated lactic acid

## 2019-03-13 NOTE — Telephone Encounter (Signed)
The pt mother called with concerns about the patient. She complains that the patient is disoriented. She also complains that the patient intake has decreased to a minimum over the last 3 days. The pt has currently only taking a few sips of water and a small amount of mash potatoes on yesterday.  She complains of urinary incontinence and a significant decline in symptoms since leaving the hospital. She states that she cannot get him out the house in his state. She is concern if he goes to the hospital that they will not be able to give him the proper care. She also states that the patient was scheduled for a ultrasound on today because of discoloration in his toes. I recommended that the patient call EMS, because the patient needs emergency care. She verbalize understanding, no questions or concerns.

## 2019-03-13 NOTE — ED Notes (Signed)
Spoke with charge nurse and made aware of plan to transport pt to the floor post administration of bp medicaitons

## 2019-03-13 NOTE — Op Note (Signed)
[name]  Percutaneous Study/Intervention Procedural Note   Date of Surgery: 03/13/2019  Surgeon(s):DEW,JASON    Assistants:none  Pre-operative Diagnosis: PAD with ischemic feet bilaterally  Post-operative diagnosis:  Same  Procedure(s) Performed:             1.  Ultrasound guidance for vascular access bilateral femoral arteries             2.  Catheter placement into the aorta from bilateral femoral approaches             3.  Aortogram and selective bilateral lower extremity angiograms             4.  Percutaneous transluminal angioplasty of the aorta with 6 mm diameter angioplasty balloons from both femoral sheaths             5.   Percutaneous transluminal angioplasty of both common iliac arteries with 6 mm diameter high-pressure angioplasty balloons and a kissing balloon fashion  6.  Percutaneous transluminal angioplasty of the right external iliac artery and proximal common femoral artery with 6 mm diameter by 8 cm length high-pressure angioplasty balloon             7.   Percutaneous transluminal angioplasty of the left external iliac artery and proximal common femoral artery with 6 mm diameter by 8 cm length high-pressure angioplasty balloon  EBL: 3 cc  Contrast: 70 cc  Fluoro Time: 10.4 minutes  Moderate Conscious Sedation Time: approximately 75 minutes using 4 mg of Versed and 100 Mcg of Fentanyl              Indications:  Patient is a 58 y.o.male with ischemic feet bilaterally.  The left leg is worse than the right leg.  The patient has a long history of peripheral arterial disease and our noninvasive studies at our institution are unreliable. The patient is brought in for angiography for further evaluation and potential treatment.  Due to the limb threatening nature of the situation, angiogram was performed for attempted limb salvage. The patient is aware that if the procedure fails, amputation would be expected.  The patient also understands that  even with successful revascularization, amputation may still be required due to the severity of the situation. Risks and benefits are discussed and informed consent is obtained.   Procedure:  The patient was identified and appropriate procedural time out was performed.  The patient was then placed supine on the table and prepped and draped in the usual sterile fashion. Moderate conscious sedation was administered during a face to face encounter with the patient throughout the procedure with my supervision of the RN administering medicines and monitoring the patient's vital signs, pulse oximetry, telemetry and mental status throughout from the start of the procedure until the patient was taken to the recovery room. Ultrasound was used to evaluate the right common femoral artery.  It was small and diseased and minimal blood flow was seen with needle access.  A digital ultrasound image was acquired.  A Seldinger needle was used to access the right common femoral artery under direct ultrasound guidance and a permanent image was performed.  A 0.035 J wire was placed after initially using a micropuncture wire and sheath and a 5 French sheath was placed.  Imaging through the 5 French sheath showed occlusion of the right iliac system and no flow in the aorta.   Tediously, I used an advantage wire and a Kumpe catheter to cross the aortoiliac occlusion and  eventually was able to get into the aorta at the suprarenal level.  Heparin was given.  Pigtail catheter was placed into the aorta and an AP aortogram was performed. This demonstrated that the celiac artery appeared to have reasonable flow.  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  The left renal artery either had a very early bifurcation or there were 2 left renal arteries.  The right renal artery appeared to be patent.  The aorta occluded approximately 1 cm below the renal arteries that came off at approximately the same level.   Both iliac arteries were occluded including the previously placed stents all the way down to the femoral arteries. I then used a micropuncture needle to access the left femoral artery which was also diseased, small, and did not have good flow.  This was accessed under direct ultrasound guidance without difficulty with a micropuncture needle and a permanent image was recorded.  A micropuncture wire and sheath were then placed.  Imaging through the micropuncture sheath showed occlusion of the left iliac system in the left common femoral artery.  Again, tediously I advanced the advantage wire and a Kumpe catheter through the aortoiliac occlusion and confirmed flow in the aorta taking another pigtail image and showing the same findings as above.  Wires were then placed bilaterally and I proceeded with angioplasty to try to open a channel.  6 mm diameter by 15 cm length Lutonix drug-coated angioplasty balloons were then inflated throughout the aorta from the renal arteries down to the bifurcation.  These were taken up to 12 atm but the balloon on the right burst.  We then used a high-pressure balloon in this location and inflated up to 16 atm on each side to treat the aorta.  6 mm diameter by 8 cm length high-pressure angioplasty balloons were then brought down to the aortic bifurcation and used to treat the common iliac arteries and a kissing balloon fashion.  Both common iliac arteries and the distal aorta were treated down to the bottom of the previously placed stents.  Inflations were 12 to 14 atm for 1 minute.  The balloons were then pulled back to encompass the external iliac arteries and common femoral arteries.  Inflations were performed simultaneously in the left and right external iliac and common femoral arteries and again taken to 12 to 14 atm for 1 minute.  Pigtail catheter was placed back into the aorta above the occlusion and imaging showed residual occlusion of the aorta and iliac arteries.  Imaging  through the femoral sheath showed continued occlusion of the right iliac system.  The left iliac system had better flow, but the distal aorta remained occluded in the common femoral artery remained occluded.  At this point, I elected to image down the leg to try to evaluate the runoff.  Imaging through the left femoral sheath was performed first. The common femoral artery appeared to be occluded with reconstitution of the profunda femoris artery and the SFA and the proximal segments.  The SFA was very small and appeared to be diseased but the runoff was so sluggish contrast never got down to the distal SFA to evaluate further.  Imaging was then performed to the right femoral sheath.  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  The SFA was small and appeared to have some disease but flow was extremely sluggish and distal runoff  below the SFA really could not be evaluated.  I elected to terminate the procedure. The sheath was removed and pressure was held in both groins.  The patient will need bilateral femoral artery reconstruction with surgical endarterectomy.  He is really not a good candidate for open abdominal operation for a typical aortobifem, and we will have to rebuild his aorta and iliac arteries with stents.  I did not feel this was safe to do today given his current infectious issues with a UTI and we were also not in an operating room where femoral artery cutdowns could be safely performed.  This has been scheduled for later this week.  The patient was taken to the recovery room in stable condition having tolerated the procedure well.  Findings:               Aortogram:  The celiac artery appeared to have reasonable flow.  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  The left renal artery either had a very early bifurcation or there were 2 left renal arteries.  The right  renal artery appeared to be patent.  The aorta occluded approximately 1 cm below the renal arteries that came off at approximately the same level.  Both iliac arteries were occluded including the previously placed stents all the way down to the femoral arteries.             Left lower Extremity:  The common femoral artery appeared to be occluded with reconstitution of the profunda femoris artery and the SFA and the proximal segments.  The SFA was very small and appeared to be diseased but the runoff was so sluggish contrast never got down to the distal SFA to evaluate further.  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  The SFA was small and appeared to have some disease but flow was extremely sluggish and distal runoff below the SFA really could not be evaluated.    Disposition: Patient was taken to the recovery room in stable condition having tolerated the procedure well.  Complications: None  Leotis Pain 03/13/2019 5:12 PM   This note was created with Dragon Medical transcription system. Any errors in dictation are purely unintentional.

## 2019-03-13 NOTE — ED Notes (Signed)
Unable to obtain pedal pulse BL, EDP at the bedside with doppler and unable to obtain pulses.

## 2019-03-13 NOTE — ED Provider Notes (Signed)
[facility] Emergency Department Provider Note  ____________________________________________   First MD Initiated Contact with Patient 03/13/19 [phone]     (approximate)  I have reviewed the triage vital signs and the nursing notes.   HISTORY  Chief Complaint Altered Mental Status    HPI Donald [name]. is a 58 y.o. male with hypertension, BPH, alcohol abuse who presents for reported altered mental status      Patient lives with his mother.  Patient was here 1 week ago for detox.Patient was recently discharged on July 1.  Patient was admitted for delirium tremors patient was discharged on a tapering dose of Librium.  History was obtained by patient's mother who is the primary caretaker for patient.  Mother was concerned  that patient was discharged back home but has been unable to take care of himself.  She has been taking care of him at her house instead.  He has been unable to ambulate.  Last night he seemed more confused and was hallucinating.  He hallucinations have since resolved.  Lesions occurred one time, nothing made it better, nothing made them worse.  She noted that he has not been eating or drinking for the past few days.  Has not recently hit his head.  Has not been drinking any alcohol.  She also noted over the past 3 weeks he has had some discoloration in his bilateral toes.  She does say that he has had a history of stent placements bilaterally so was unsure if 1 of them was included.  No known fevers.  Patient's mental status is now at baseline per mother.   Past Medical History:  Diagnosis Date   Depression    Emphysema of lung ([place])    Essential hypertension    History of chicken pox    Low weight    Neuropathy    bilateral lower legs   Peripheral vascular disease ([place])    Prostate disease    Seasonal allergies    Seizures ([place])    Wears dentures    full upper    Patient Active Problem List   Diagnosis Date Noted    Protein-calorie malnutrition, severe 03/06/2019   Pressure injury of skin 03/05/2019   DTs (delirium tremens) ([place]) 03/04/2019   Protein calorie malnutrition ([place]) 04/12/2018   Insomnia 04/12/2018   Low weight 07/06/2017   Environmental and seasonal allergies 12/29/2016   Special screening for malignant neoplasms, colon    Benign neoplasm of cecum    Benign neoplasm of transverse colon    Benign neoplasm of sigmoid colon    Alcohol dependence, in remission ([place]) 01/20/2016   Mononeuropathy of both lower extremities 01/20/2016   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   Hyponatremia 11/14/2015   Peripheral neuropathy 07/29/2015   Essential hypertension 06/20/2015   Benign prostatic hyperplasia 06/20/2015   PAD (peripheral artery disease) ([place]) 06/20/2015   Former smoker 06/20/2015   Centrilobular emphysema ([place]) 06/20/2015    Past Surgical History:  Procedure Laterality Date   CATARACT EXTRACTION W/PHACO Left 05/18/2018   Procedure: CATARACT EXTRACTION PHACO AND INTRAOCULAR LENS PLACEMENT ([place]) LEFT;  Surgeon: Leandrew Koyanagi, MD;  Location: [place];  Service: Ophthalmology;  Laterality: Left;   COLONOSCOPY WITH PROPOFOL N/A 02/25/2016   Procedure: COLONOSCOPY WITH PROPOFOL;  Surgeon: [name], MD;  Location: [facility] ENDOSCOPY;  Service: Endoscopy;  Laterality: N/A;   EYE SURGERY  2015   Cataract   VASCULAR SURGERY     "7 stents in legs"  Prior to Admission medications   Medication Sig Start Date End Date Taking? Authorizing Provider  albuterol (VENTOLIN HFA) 108 (90 Base) MCG/ACT inhaler Inhale 2 puffs into the lungs every 6 (six) hours as needed for wheezing or shortness of breath. 02/15/19   Karamalegos, Devonne Doughty, DO  amLODipine (NORVASC) 10 MG tablet Take 1 tablet (10 mg total) by mouth daily. 03/09/19   Stark Jock Jude, MD  aspirin 81 MG tablet Take 1 tablet (81 mg total) by mouth daily. 07/29/15   Luciana Axe, NP  chlordiazePOXIDE (LIBRIUM) 25  MG capsule 25 mg p.o. twice daily x2 days Then 25 mg p.o. daily x2 days Patient not taking: Reported on 03/09/2019 03/08/19   Otila Back, MD  cloNIDine (CATAPRES) 0.1 MG tablet Take 1 tablet (0.1 mg total) by mouth 2 (two) times daily. 03/08/19   Stark Jock Jude, MD  feeding supplement, ENSURE ENLIVE, (ENSURE ENLIVE) LIQD Take 237 mLs by mouth 2 (two) times daily between meals. 03/08/19 04/07/19  Stark Jock Jude, MD  Fluticasone-Salmeterol (ADVAIR) 250-50 MCG/DOSE AEPB Inhale 1 puff into the lungs 2 (two) times daily. 07/06/17   Karamalegos, Devonne Doughty, DO  megestrol (MEGACE) 40 MG tablet Take 2-4 tablets (80-160 mg total) by mouth daily. Start with 2 tablets a day, then gradually increase as tolerated up to 4 per day 01/12/19   Olin Hauser, DO  metoprolol succinate (TOPROL-XL) 50 MG 24 hr tablet Take 1 tablet (50 mg total) by mouth daily. 12/23/18   Karamalegos, Devonne Doughty, DO  tamsulosin (FLOMAX) 0.4 MG CAPS capsule Take 1 capsule (0.4 mg total) by mouth daily after supper. 01/12/19   Olin Hauser, DO    Allergies Patient has no known allergies.  Family History  Problem Relation Age of Onset   Ulcers Father        bleeding   Cancer Mother        Breast    Social History Social History   Tobacco Use   Smoking status: Former Smoker    Packs/day: 0.50    Years: 35.00    Pack years: 17.50    Types: Cigarettes    Quit date: 01/25/2018    Years since quitting: 1.1   Smokeless tobacco: Former Systems developer    Quit date: 08/31/2016   Tobacco comment: Previously quit in 09/2016 on Chantix until 09/2017  Substance Use Topics   Alcohol use: Not Currently    Alcohol/week: 0.0 standard drinks    Comment: occ   Drug use: No      Review of Systems Constitutional: No fever/chills Eyes: No visual changes. ENT: No sore throat. Cardiovascular: Denies chest pain. Respiratory: Denies shortness of breath. Gastrointestinal: No abdominal pain.  No nausea, no vomiting.  No diarrhea.  No  constipation. Genitourinary: Negative for dysuria. Musculoskeletal: Negative for back pain. Skin: Negative for rash. Neurological: Negative for headaches, focal weakness or numbness. Psych: Hallucinations All other ROS negative ____________________________________________   PHYSICAL EXAM:  VITAL SIGNS: Blood pressure (!) 147/100, pulse (!) 113, temperature 97.7 F (36.5 C), temperature source Axillary, resp. rate (!) 42, height 5\' 9"  (1.753 m), weight 49.2 kg, SpO2 97 %.   Constitutional: Alert and oriented. Well appearing and in no acute distress. Eyes: Conjunctivae are normal. EOMI. Head: Atraumatic. Nose: No congestion/rhinnorhea. Mouth/Throat: Mucous membranes are moist.  Oropharynx non-erythematous. Neck: No stridor. Trachea Midline. FROM Cardiovascular: Normal rate, regular rhythm. Grossly normal heart sounds.  Good peripheral circulation. Respiratory: Normal respiratory effort.  No retractions. Lungs CTAB. Gastrointestinal: Soft and nontender.  No distention. No abdominal bruits.  Musculoskeletal: No lower extremity tenderness nor edema.  No joint effusions. Neurologic:  Normal speech and language. No gross focal neurologic deficits are appreciated.  Skin: Slightly blue discoloration of the toes, warm feet, difficult to palpate pulses bilaterally, multiple areas of bruises and pressure sore on the left hip Psychiatric: Mood and affect are normal. Speech and behavior are normal. GU: Testicles normal  ____________________________________________   LABS (all labs ordered are listed, but only abnormal results are displayed)  Labs Reviewed  CBC WITH DIFFERENTIAL/PLATELET - Abnormal; Notable for the following components:      Result Value   WBC 21.5 (*)    MCH 34.3 (*)    Platelets 478 (*)    Neutro Abs 18.4 (*)    Monocytes Absolute 1.7 (*)    Abs Immature Granulocytes 0.09 (*)    All other components within normal limits  COMPREHENSIVE METABOLIC PANEL - Abnormal;  Notable for the following components:   Sodium 146 (*)    Potassium 3.1 (*)    CO2 21 (*)    Glucose, Bld 140 (*)    Calcium 10.5 (*)    Total Protein 9.8 (*)    AST 86 (*)    Total Bilirubin 1.8 (*)    All other components within normal limits  URINALYSIS, ROUTINE W REFLEX MICROSCOPIC - Abnormal; Notable for the following components:   Color, Urine YELLOW (*)    APPearance HAZY (*)    Hgb urine dipstick MODERATE (*)    Ketones, ur 20 (*)    Protein, ur 100 (*)    Nitrite POSITIVE (*)    Leukocytes,Ua SMALL (*)    WBC, UA >50 (*)    All other components within normal limits  LACTIC ACID, PLASMA - Abnormal; Notable for the following components:   Lactic Acid, Venous 2.3 (*)    All other components within normal limits  LACTIC ACID, PLASMA - Abnormal; Notable for the following components:   Lactic Acid, Venous 2.3 (*)    All other components within normal limits  URINE DRUG SCREEN, QUALITATIVE ([facility] ONLY) - Abnormal; Notable for the following components:   Benzodiazepine, Ur Scrn POSITIVE (*)    All other components within normal limits  SARS CORONAVIRUS 2 (HOSPITAL ORDER, [place] [facility])  URINE CULTURE  CULTURE, BLOOD (ROUTINE X 2)  CULTURE, BLOOD (ROUTINE X 2)  MRSA PCR SCREENING  ETHANOL  PROTIME-INR  APTT   ____________________________________________   ED ECG REPORT I, Vanessa Oakman, the attending physician, personally viewed and interpreted this ECG.  EKG sinus tachycardia rate of 162, normal axis, poor baseline but no obvious ST elevation.  Patient's QTC is prolonged at 73 ____________________________________________  RADIOLOGY I, Vanessa Jessamine, personally viewed and evaluated these images (plain radiographs) as part of my medical decision making, as well as reviewing the written report by the radiologist.  ED MD interpretation: Chest x-ray reviewed concerning for possible infiltrate on the left side.  Official radiology report(s): Ct  Head Wo Contrast  Result Date: 03/13/2019 CLINICAL DATA:  Donald [name], altered level of consciousness. EXAM: CT HEAD WITHOUT CONTRAST TECHNIQUE: Contiguous axial images were obtained from the base of the skull through the vertex without intravenous contrast. COMPARISON:  Head CT dated 03/04/2019. FINDINGS: Brain: Generalized parenchymal volume loss with commensurate dilatation of the ventricles and sulci. Chronic small vessel ischemic changes again noted within the bilateral periventricular and subcortical white matter regions. Old lacunar infarcts again noted  within each basal ganglia region. No mass, hemorrhage, edema or other evidence of acute parenchymal abnormality. No extra-axial hemorrhage. Vascular: Chronic calcified atherosclerotic changes of the large vessels at the skull base. No unexpected hyperdense vessel. Skull: Normal. Negative for fracture or focal lesion. Sinuses/Orbits: No acute finding. Other: None. IMPRESSION: 1. No acute findings. No intracranial mass, hemorrhage or edema. 2. Atrophy and chronic ischemic changes, as detailed above. Electronically Signed   By: Franki Cabot M.D.   On: 03/13/2019 12:12   Dg Chest Portable 1 View  Result Date: 03/13/2019 CLINICAL DATA:  Pt comes into the ED via EMS from home, EMS reports pt lives with his mother and was here last week for detox, states he has been having hallucinations, not eating well ; Tachycardia Former smoker EXAM: PORTABLE CHEST 1 VIEW COMPARISON:  06/15/2016, 11/18/2011 and CT of the chest on 02/15/2014 FINDINGS: Heart size is normal. The lungs are hyperinflated. There is atherosclerotic calcification of the thoracic aorta. There is perihilar peribronchial thickening. Stable scarring in the RIGHT UPPER lobe. There is increased streaky opacity in the MEDIAL LEFT lung base, consistent with developing infiltrate or significant atelectasis. There is no pulmonary edema. IMPRESSION: 1. Hyperinflation and bronchitic changes. 2. Developing  atelectasis or infiltrate in the MEDIAL LEFT lung base. Electronically Signed   By: Nolon Nations M.D.   On: 03/13/2019 09:55    ____________________________________________   PROCEDURES  Procedure(s) performed (including Critical Care):  .Critical Care Performed by: Vanessa [place], MD Authorized by: Vanessa , MD   Critical care provider statement:    Critical care time (minutes):  30   Critical care was necessary to treat or prevent imminent or life-threatening deterioration of the following conditions:  Dehydration and sepsis   Critical care was time spent personally by me on the following activities:  Discussions with consultants, evaluation of patient's response to treatment, examination of patient, ordering and performing treatments and interventions, ordering and review of laboratory studies, ordering and review of radiographic studies, pulse oximetry, re-evaluation of patient's condition, obtaining history from patient or surrogate and review of old charts     ____________________________________________   INITIAL IMPRESSION / ASSESSMENT AND PLAN / ED COURSE    Donald [name]. was evaluated in Emergency Department on 03/13/2019 for the symptoms described in the history of present illness. He was evaluated in the context of the global COVID-19 pandemic, which necessitated consideration that the patient might be at risk for infection with the SARS-CoV-2 virus that causes COVID-19. Institutional protocols and algorithms that pertain to the evaluation of patients at risk for COVID-19 are in a state of rapid change based on information released by regulatory bodies including the CDC and federal and state organizations. These policies and algorithms were followed during the patient's care in the ED.    Initially patient presented extremely tachycardic into the 160s and hypertensive.  Given recent history of delirium tremors patient was given 1 mg of Ativan.  This did not help  his symptoms and upon further evaluation collateral information from the mother who is been with him 24/7 denies any alcohol use.  I am therefore more concerned that his tachycardia secondary to severe dehydration versus infection.  Will get labs to further evaluate for pneumonia, UTI.  I did examine his GU area there is no evidence of Fournier's gangrene.  Patient seems to be at mental baseline right now and has not hit his head therefore low suspicion for epidural subdural hematoma.  Although difficult  to palpate pulses on patient's bilateral feet I suspect that the blue discoloration is more chronic in nature rather than acute limb ischemia.  I will consult vascular surgery for further recommendations but mostly will need more imaging to further evaluate this.   Clinical Course as of Mar 12 1133  Mon Mar 13, 2019  1119 Lactate simply elevated 2.3 patient received a 30 mix per kick fluid bolus.  Patient is gotten broad-spectrum antibiotics for presumed sepsis.   [MF]  1123 Urine looks concerning for UTI. Carter virus testing is negative. Heart rate is now better with her fluid.   [MF]  3785 In for possible infiltrate on the x-ray to antibiotics now covering.   [MF]    Clinical Course User Index [MF] Vanessa Agar, MD   10:48 AM spoke with vascular surgery and they will see the patient  ____________________________________________   FINAL CLINICAL IMPRESSION(S) / ED DIAGNOSES   Final diagnoses:  Acute cystitis without hematuria  Healthcare-associated pneumonia  Dehydration  Sepsis, due to unspecified organism, unspecified whether acute organ dysfunction present [facility])      MEDICATIONS GIVEN DURING THIS VISIT:  Medications  aspirin EC tablet 81 mg ( Oral Automatically Held 03/22/19 1000)  megestrol (MEGACE) tablet 80-160 mg ( Oral Automatically Held 03/21/19 1000)  amLODipine (NORVASC) tablet 10 mg ( Oral Automatically Held 03/21/19 1000)  cloNIDine (CATAPRES) tablet 0.1 mg (  Oral Automatically Held 03/21/19 2200)  metoprolol succinate (TOPROL-XL) 24 hr tablet 50 mg ( Oral Automatically Held 03/21/19 1000)  tamsulosin (FLOMAX) capsule 0.4 mg ( Oral Automatically Held 03/21/19 1800)  feeding supplement (ENSURE ENLIVE) (ENSURE ENLIVE) liquid 237 mL ( Oral Automatically Held 03/21/19 1400)  ipratropium-albuterol (DUONEB) 0.5-2.5 (3) MG/3ML nebulizer solution 3 mL ( Nebulization Automatically Held 03/21/19 2000)  enoxaparin (LOVENOX) injection 40 mg ( Subcutaneous Automatically Held 03/21/19 2200)  0.9 %  sodium chloride infusion (has no administration in time range)  LORazepam (ATIVAN) tablet 1 mg ( Oral MAR Hold 03/13/19 1448)    Or  LORazepam (ATIVAN) injection 1 mg ( Intravenous MAR Hold 03/13/19 1448)  thiamine (VITAMIN B-1) tablet 100 mg ( Oral Automatically Held 8/85/02 7741)  folic acid (FOLVITE) tablet 1 mg ( Oral Automatically Held 03/21/19 1000)  multivitamin with minerals tablet 1 tablet ( Oral Automatically Held 03/21/19 1000)  labetalol (NORMODYNE) injection 10 mg ( Intravenous MAR Hold 03/13/19 1448)  diphenhydrAMINE (BENADRYL) injection 50 mg (has no administration in time range)  famotidine (PEPCID) tablet 40 mg (has no administration in time range)  HYDROmorphone (DILAUDID) injection 1 mg (has no administration in time range)  methylPREDNISolone sodium succinate (SOLU-MEDROL) 125 mg/2 mL injection 125 mg (has no administration in time range)  midazolam (VERSED) 2 MG/ML syrup 8 mg (has no administration in time range)  ondansetron (ZOFRAN) injection 4 mg (has no administration in time range)  ceFEPIme (MAXIPIME) 2 g in sodium chloride 0.9 % 100 mL IVPB (has no administration in time range)  vancomycin (VANCOCIN) IVPB 750 mg/150 ml premix (has no administration in time range)  midazolam (VERSED) 2 MG/2ML injection (has no administration in time range)  fentaNYL (SUBLIMAZE) 100 MCG/2ML injection (has no administration in time range)  heparin 1000 UNIT/ML injection  (has no administration in time range)  fentaNYL (SUBLIMAZE) injection (50 mcg Intravenous Given 03/13/19 1602)  midazolam (VERSED) injection (2 mg Intravenous Given 03/13/19 1602)  heparin injection (4,000 Units Intravenous Given 03/13/19 1613)  lactated ringers bolus 1,000 mL (0 mLs Intravenous Stopped 03/13/19 1137)  LORazepam (  ATIVAN) injection 1 mg (1 mg Intravenous Given 03/13/19 0943)  ceFEPIme (MAXIPIME) 2 g in sodium chloride 0.9 % 100 mL IVPB (0 g Intravenous Stopped 03/13/19 1139)  metroNIDAZOLE (FLAGYL) IVPB 500 mg (0 mg Intravenous Stopped 03/13/19 1243)  vancomycin (VANCOCIN) IVPB 1000 mg/200 mL premix (0 mg Intravenous Stopped 03/13/19 1316)  sodium chloride 0.9 % bolus 500 mL (0 mLs Intravenous Stopped 03/13/19 1219)  sodium chloride 0.9 % 1,000 mL with thiamine 360 mg, folic acid 1 mg, multivitamins adult 10 mL infusion ( Intravenous Transfusing/Transfer 03/13/19 1423)     ED Discharge Orders    None       Note:  This document was prepared using Dragon voice recognition software and may include unintentional dictation errors.   Vanessa [place], MD 03/13/19 [id number]

## 2019-03-13 NOTE — H&P (Addendum)
[place] [facility] [place] NAME: Donald [name]    MR#:  [id number]  DATE OF BIRTH:  1960-11-17  DATE OF ADMISSION:  03/13/2019  PRIMARY CARE PHYSICIAN: Olin Hauser, DO   REQUESTING/REFERRING PHYSICIAN:  CHIEF COMPLAINT:   Chief Complaint  Patient presents with  . Altered Mental Status    HISTORY OF PRESENT ILLNESS:  58 y.o. male with pertinent past medical history of hypertension, BPH, former tobacco abuse, alcohol abuse, peripheral neuropathy, PVD, anemia, sensory ataxia, colon cancer, depression, hypertension, and seizure presenting to the ED with altered mental status.  Patient is not a good historian so history mostly obtained from patient's chart.  Per PCP notes, patient's mother called with concerns about the patient being disoriented.  She stated patient has had poor p.o. intake since last admission for delirium tremens with worsening over the past 3 days.  She reported patient is currently only taking a few sips of water and small amount of food.  His mother also states that he has been up all night hallucinating, and talking to himself.  Patient normally uses crutches to ambulate and lives on his own however since he was discharged he has been living with the mom due to bilateral lower extremity pain with discoloration of his toes.  She is also noted urinary incontinence and foul urine odor.  She was concerned that patient is declining and may need to go back to the hospital for further evaluation.  On arrival to the ED, he was afebrile with blood pressure 174/107 mm Hg and pulse rate 162 beats/min. There were no focal neurological deficits; he was alert and oriented x 3.  Initial labs revealed elevated WBC 21.5, platelets 478,K+ 3.1, AST 86, total bilirubin 1.8, lactic acid 2.3, UDS positive for benzo, urinalysis with positive nitrates, leukocytes and elevated WBC.  ECG showed sinus rhythm of 51 beats per minute and the chest X-ray  showed hyperinflation and bronchitic changes, atelectasis versus infiltrate in the medial left lung base. A non-contrast head CT showed no acute intracranial abnormality.  Patient will be admitted to hospitalist service for further management.  PAST MEDICAL HISTORY:   Past Medical History:  Diagnosis Date  . Depression   . Emphysema of lung ([place])   . Essential hypertension   . History of chicken pox   . Low weight   . Neuropathy    bilateral lower legs  . Peripheral vascular disease ([place])   . Prostate disease   . Seasonal allergies   . Seizures ([place])   . Wears dentures    full upper   PAST SURGICAL HISTORY:   Past Surgical History:  Procedure Laterality Date  . CATARACT EXTRACTION W/PHACO Left 05/18/2018   Procedure: CATARACT EXTRACTION PHACO AND INTRAOCULAR LENS PLACEMENT ([place]) LEFT;  Surgeon: Leandrew Koyanagi, MD;  Location: [place];  Service: Ophthalmology;  Laterality: Left;  . COLONOSCOPY WITH PROPOFOL N/A 02/25/2016   Procedure: COLONOSCOPY WITH PROPOFOL;  Surgeon: [name], MD;  Location: [facility] ENDOSCOPY;  Service: Endoscopy;  Laterality: N/A;  . EYE SURGERY  2015   Cataract  . VASCULAR SURGERY     "7 stents in legs"   SOCIAL HISTORY:   Social History   Tobacco Use  . Smoking status: Former Smoker    Packs/day: 0.50    Years: 35.00    Pack years: 17.50    Types: Cigarettes    Quit date: 01/25/2018    Years since quitting: 1.1  .  Smokeless tobacco: Former Systems developer    Quit date: 08/31/2016  . Tobacco comment: Previously quit in 09/2016 on Chantix until 09/2017  Substance Use Topics  . Alcohol use: Not Currently    Alcohol/week: 0.0 standard drinks    Comment: occ    FAMILY HISTORY:   Family History  Problem Relation Age of Onset  . Ulcers Father        bleeding  . Cancer Mother        Breast    DRUG ALLERGIES:  No Known Allergies  REVIEW OF SYSTEMS:   ROS Unable to obtain due to current altered mental status  MEDICATIONS AT HOME:    Prior to Admission medications   Medication Sig Start Date End Date Taking? Authorizing Provider  albuterol (VENTOLIN HFA) 108 (90 Base) MCG/ACT inhaler Inhale 2 puffs into the lungs every 6 (six) hours as needed for wheezing or shortness of breath. 02/15/19  Yes Karamalegos, Devonne Doughty, DO  amLODipine (NORVASC) 10 MG tablet Take 1 tablet (10 mg total) by mouth daily. 03/09/19  Yes Ojie, Jude, MD  aspirin 81 MG tablet Take 1 tablet (81 mg total) by mouth daily. 07/29/15  Yes Krebs, Amy Lauren, NP  cloNIDine (CATAPRES) 0.1 MG tablet Take 1 tablet (0.1 mg total) by mouth 2 (two) times daily. 03/08/19  Yes Ojie, Jude, MD  feeding supplement, ENSURE ENLIVE, (ENSURE ENLIVE) LIQD Take 237 mLs by mouth 2 (two) times daily between meals. 03/08/19 04/07/19 Yes Ojie, Jude, MD  Fluticasone-Salmeterol (ADVAIR) 250-50 MCG/DOSE AEPB Inhale 1 puff into the lungs 2 (two) times daily. 07/06/17  Yes Karamalegos, Devonne Doughty, DO  megestrol (MEGACE) 40 MG tablet Take 2-4 tablets (80-160 mg total) by mouth daily. Start with 2 tablets a day, then gradually increase as tolerated up to 4 per day 01/12/19  Yes Karamalegos, Devonne Doughty, DO  metoprolol succinate (TOPROL-XL) 50 MG 24 hr tablet Take 1 tablet (50 mg total) by mouth daily. 12/23/18  Yes Karamalegos, Devonne Doughty, DO  tamsulosin (FLOMAX) 0.4 MG CAPS capsule Take 1 capsule (0.4 mg total) by mouth daily after supper. 01/12/19  Yes Karamalegos, Devonne Doughty, DO  chlordiazePOXIDE (LIBRIUM) 25 MG capsule 25 mg p.o. twice daily x2 days Then 25 mg p.o. daily x2 days Patient not taking: Reported on 03/09/2019 03/08/19   Otila Back, MD      VITAL SIGNS:  Blood pressure (!) 173/110, pulse (!) 138, temperature 98.6 F (37 C), temperature source Oral, resp. rate (!) 29, height 5\' 9"  (1.753 m), weight 49.2 kg, SpO2 98 %.  PHYSICAL EXAMINATION:   Physical Exam  GENERAL:  58 y.o.-year-old thin and frail appearing patient lying in the bed with no acute distress.  EYES: Pupils equal,  round, reactive to light and accommodation. No scleral icterus. Extraocular muscles intact.  HEENT: Head atraumatic, normocephalic. Oropharynx and nasopharynx clear.  NECK:  Supple, no jugular venous distention. No thyroid enlargement, no tenderness.  LUNGS: Normal breath sounds bilaterally, no wheezing, rales,rhonchi or crepitation. No use of accessory muscles of respiration.  CARDIOVASCULAR: S1, S2 normal. No murmurs, rubs, or gallops.  ABDOMEN: Soft, nontender, nondistended. Bowel sounds present. No organomegaly or mass.  EXTREMITIES: No pedal edema, cyanosis, or clubbing. No rash or lesions. + pedal pulses MUSCULOSKELETAL: Normal bulk, and power was 5+ grip and elbow, knee, and ankle flexion and extension bilaterally.  NEUROLOGIC:Alert and oriented x 3. CN 2-12 intact. Sensation to light touch and cold stimuli intact bilaterally. Finger to nose nl. Babinski is downgoing. DTR's (biceps, patellar,  and achilles) 2+ and symmetric throughout. Gait not tested due to safety concern. PSYCHIATRIC: The patient is alert and oriented to person, place and month.  SKIN: Multiple bruises and pressure sore on the left hip with discoloration. Bluish discoloration of  Bilateral toes  DATA REVIEWED:  LABORATORY PANEL:   CBC Recent Labs  Lab 03/13/19 0931  WBC 21.5*  HGB 16.7  HCT 47.9  PLT 478*   ------------------------------------------------------------------------------------------------------------------  Chemistries  Recent Labs  Lab 03/08/19 0435 03/13/19 0931  NA 134* 146*  K 3.3* 3.1*  CL 106 110  CO2 18* 21*  GLUCOSE 96 140*  BUN <5* 18  CREATININE 0.40* 0.72  CALCIUM 8.7* 10.5*  MG 1.6*  --   AST  --  86*  ALT  --  29  ALKPHOS  --  87  BILITOT  --  1.8*   ------------------------------------------------------------------------------------------------------------------  Cardiac Enzymes No results for input(s): TROPONINI in the last 168 hours.  ------------------------------------------------------------------------------------------------------------------  RADIOLOGY:  Ct Head Wo Contrast  Result Date: 03/13/2019 CLINICAL DATA:  Lucent a shins, altered level of consciousness. EXAM: CT HEAD WITHOUT CONTRAST TECHNIQUE: Contiguous axial images were obtained from the base of the skull through the vertex without intravenous contrast. COMPARISON:  Head CT dated 03/04/2019. FINDINGS: Brain: Generalized parenchymal volume loss with commensurate dilatation of the ventricles and sulci. Chronic small vessel ischemic changes again noted within the bilateral periventricular and subcortical white matter regions. Old lacunar infarcts again noted within each basal ganglia region. No mass, hemorrhage, edema or other evidence of acute parenchymal abnormality. No extra-axial hemorrhage. Vascular: Chronic calcified atherosclerotic changes of the large vessels at the skull base. No unexpected hyperdense vessel. Skull: Normal. Negative for fracture or focal lesion. Sinuses/Orbits: No acute finding. Other: None. IMPRESSION: 1. No acute findings. No intracranial mass, hemorrhage or edema. 2. Atrophy and chronic ischemic changes, as detailed above. Electronically Signed   By: Franki Cabot M.D.   On: 03/13/2019 12:12   Dg Chest Portable 1 View  Result Date: 03/13/2019 CLINICAL DATA:  Pt comes into the ED via EMS from home, EMS reports pt lives with his mother and was here last week for detox, states he has been having hallucinations, not eating well ; Tachycardia Former smoker EXAM: PORTABLE CHEST 1 VIEW COMPARISON:  06/15/2016, 11/18/2011 and CT of the chest on 02/15/2014 FINDINGS: Heart size is normal. The lungs are hyperinflated. There is atherosclerotic calcification of the thoracic aorta. There is perihilar peribronchial thickening. Stable scarring in the RIGHT UPPER lobe. There is increased streaky opacity in the MEDIAL LEFT lung base, consistent with developing  infiltrate or significant atelectasis. There is no pulmonary edema. IMPRESSION: 1. Hyperinflation and bronchitic changes. 2. Developing atelectasis or infiltrate in the MEDIAL LEFT lung base. Electronically Signed   By: Nolon Nations M.D.   On: 03/13/2019 09:55   EKG:  EKG: there are no previous tracings available for comparison.  IMPRESSION AND PLAN:   58 y.o. male with pertinent past medical history of hypertension, BPH, former tobacco abuse, alcohol abuse, peripheral neuropathy, PVD, anemia, sensory ataxia, colon cancer, depression, hypertension, and seizure presenting to the ED with altered mental status.  1.  Sepsis -patient presenting with tachypnea, tachycardia, hypertension, elevated WBC, elevated lactic acid 2.3 meeting sepsis criteria - Etiology likely due to UTI and possible pneumonia? - Admit to MedSurg floor with telemetry monitoring - Chest x-ray shows hyperinflation and bronchitic changes, atelectasis versus infiltrate in the medial left lung base - Blood cultures pending *-Urine culture pending -  Check MRSA PCR - COVID 19 negative - Start IV abx coverage with Cefepime and Vanc - IVFs and PRN bolus to keep MAP >60 per sepsis protocol  2.  Altered mental status - Likely due to sepsis - CT head negative for acute intracranial abnormality - Monitor neurological status  3.  UTI - UA positive for UTI - Urine culture pending - IV antibiotics with cefepime and vancomycin  4. Hx of PVD - Bluish discoloration of toes - Vascular surgery consult for possible LLE angiogram  5. ETOH abuse - Recent hx of DTs - No signs of withdrawal - Check  Daily BMP+Mg - Banana Bag x1 (1L NS or D5W, 100mg  thiamine, 1mg  folate, 1amp/tab MVI, 3g magnesium sulfate) X1 - Daily Thiamine, Folate, MVI once tolerating PO - Insomnia : Melatonin 3mg  - PT/OT evaluation for mobility  6. Hypokalemia - Replace and recheck in the am  7. HTN  + Goal BP <140/90 - On Clonidine, metoprolol and  amlodipine - IV labetalol PRN   All the records are reviewed and case discussed with ED provider. Management plans discussed with the patient, family and they are in agreement.  CODE STATUS: FULL  TOTAL TIME TAKING CARE OF THIS PATIENT: 40 minutes.    on 03/13/2019 at 2:12 PM  Rufina Falco, DNP, FNP-BC [facility] Nurse Practitioner Between 7am to 6pm - Pager [phone]  After 6pm go to [email or web address] - password [email or web address] [facility]  Office  [phone]  CC: Primary care physician; Olin Hauser, DO

## 2019-03-13 NOTE — ED Triage Notes (Signed)
Pt comes into the ED via EMS from home, EMS reports pt lives with his mother and was here last week for detox, states he has been having hallucinations, not eating well .

## 2019-03-13 NOTE — H&P (Signed)
[facility] History & Physical Update  The patient was interviewed and re-examined.  The patient's previous History and Physical has been reviewed and is unchanged.  There is no change in the plan of care. We plan to proceed with the scheduled procedure.  Leotis Pain, MD  03/13/2019, 3:33 PM

## 2019-03-13 NOTE — Consult Note (Addendum)
ANTICOAGULATION CONSULT NOTE - Initial Consult  Pharmacy Consult for Heparin Indication: aortic occlusion  No Known Allergies  Patient Measurements: Height: 5\' 9"  (175.3 cm) Weight: 108 lb 7.5 oz (49.2 kg) IBW/kg (Calculated) : 70.7 Heparin Dosing Weight: 49.2  Vital Signs: Temp: 97.7 F (36.5 C) (07/06 1454) Temp Source: Axillary (07/06 1454) BP: 169/111 (07/06 1705) Pulse Rate: 98 (07/06 1705)  Labs: Recent Labs    03/13/19 0931 03/13/19 0956  HGB 16.7  --   HCT 47.9  --   PLT 478*  --   APTT  --  31  LABPROT  --  14.0  INR  --  1.1  CREATININE 0.72  --     Estimated Creatinine Clearance: 72.6 mL/min (by C-G formula based on SCr of 0.72 mg/dL).   Medical History: Past Medical History:  Diagnosis Date  . Depression   . Emphysema of lung ([place])   . Essential hypertension   . History of chicken pox   . Low weight   . Neuropathy    bilateral lower legs  . Peripheral vascular disease ([place])   . Prostate disease   . Seasonal allergies   . Seizures ([place])   . Wears dentures    full upper    Medications:  Medications Prior to Admission  Medication Sig Dispense Refill Last Dose  . acetaminophen (TYLENOL) 500 MG tablet Take 500 mg by mouth every 6 (six) hours as needed.   03/11/2019  . albuterol (VENTOLIN HFA) 108 (90 Base) MCG/ACT inhaler Inhale 2 puffs into the lungs every 6 (six) hours as needed for wheezing or shortness of breath. 8.5 g 2 Unknown at PRN  . amLODipine (NORVASC) 10 MG tablet Take 1 tablet (10 mg total) by mouth daily. 30 tablet 0 03/12/2019 at 1000  . aspirin 81 MG tablet Take 1 tablet (81 mg total) by mouth daily. 30 tablet 11 03/08/2019 at Unknown time  . cloNIDine (CATAPRES) 0.1 MG tablet Take 1 tablet (0.1 mg total) by mouth 2 (two) times daily. 60 tablet 0 03/08/2019 at Unknown time  . feeding supplement, ENSURE ENLIVE, (ENSURE ENLIVE) LIQD Take 237 mLs by mouth 2 (two) times daily between meals. 14220 mL 0 03/11/2019  . Fluticasone-Salmeterol  (ADVAIR) 250-50 MCG/DOSE AEPB Inhale 1 puff into the lungs 2 (two) times daily. 60 each 11 03/08/2019 at Unknown time  . megestrol (MEGACE) 40 MG tablet Take 2-4 tablets (80-160 mg total) by mouth daily. Start with 2 tablets a day, then gradually increase as tolerated up to 4 per day 120 tablet 0 Past Week at Unknown time  . metoprolol succinate (TOPROL-XL) 50 MG 24 hr tablet Take 1 tablet (50 mg total) by mouth daily. 90 tablet 1 03/08/2019 at Unknown time  . tamsulosin (FLOMAX) 0.4 MG CAPS capsule Take 1 capsule (0.4 mg total) by mouth daily after supper. 90 capsule 3 03/08/2019 at Unknown time  . chlordiazePOXIDE (LIBRIUM) 25 MG capsule 25 mg p.o. twice daily x2 days Then 25 mg p.o. daily x2 days (Patient not taking: Reported on 03/09/2019) 6 capsule 0 03/07/2019 at Unknown time   Scheduled:  . [MAR Hold] amLODipine  10 mg Oral Daily  . [MAR Hold] aspirin EC  81 mg Oral Daily  . [MAR Hold] cloNIDine  0.1 mg Oral BID  . [MAR Hold] enoxaparin (LOVENOX) injection  40 mg Subcutaneous Q24H  . [MAR Hold] feeding supplement (ENSURE ENLIVE)  237 mL Oral BID BM  . fentaNYL      . [facility] Hold]  folic acid  1 mg Oral Daily  . heparin      . [MAR Hold] ipratropium-albuterol  3 mL Nebulization Q6H  . [MAR Hold] megestrol  80-160 mg Oral Daily  . [MAR Hold] metoprolol succinate  50 mg Oral Daily  . midazolam      . [MAR Hold] multivitamin with minerals  1 tablet Oral Daily  . [MAR Hold] tamsulosin  0.4 mg Oral QPC supper  . [MAR Hold] thiamine  100 mg Oral Daily   Infusions:  . sodium chloride    . ceFEPime (MAXIPIME) IV    . [START ON 03/14/2019] vancomycin     PRN: diphenhydrAMINE, famotidine, fentaNYL, heparin, HYDROmorphone (DILAUDID) injection, [MAR Hold] labetalol, [MAR Hold] LORazepam **OR** [MAR Hold] LORazepam, methylPREDNISolone (SOLU-MEDROL) injection, midazolam, midazolam, ondansetron (ZOFRAN) IV  Assessment: Pharmacy consulted for heparin. No DOAC PTA.   Goal of Therapy:  Heparin level 0.3-0.7  units/ml Monitor platelets by anticoagulation protocol: Yes   Plan:  Pt received 4000 units bolus x 1 Start heparin infusion at 750 units/hr Check anti-Xa level in 6 hours and daily while on heparin Continue to monitor H&H and platelets  Oswald Hillock, PharmD, BCPS 03/13/2019,5:14 PM

## 2019-03-13 NOTE — Consult Note (Signed)
CODE SEPSIS - PHARMACY COMMUNICATION  **Broad Spectrum Antibiotics should be administered within 1 hour of Sepsis diagnosis**  Time Code Sepsis Called/Page Received: 1012  Antibiotics Ordered: Vancomycin/Cefepime/Metronidazole  Time of 1st antibiotic administration: 1100  Additional action taken by pharmacy: none  If necessary, Name of Provider/Nurse Contacted: [place], PharmD, BCPS Clinical Pharmacist 03/13/2019 11:01 AM

## 2019-03-13 NOTE — ED Notes (Signed)
Stage 1 pressure sore on the left hip with discoloration

## 2019-03-13 NOTE — Progress Notes (Signed)
Shallow breathing; full of rhonchi throughout. Does not clear with cough. Pt. Barely follows commands. Unable to hold pen in hand. Call to pt. Mother for med. Clarification and consent.

## 2019-03-13 NOTE — Progress Notes (Signed)
Spoke with pt. Mother on phone : Donald [name]. Donald [name] states pt. Has not eaten or drank since yesterday at 16:00 , that he has fallen multiple times in last few days, and that pt.has become lethargic since Saturday (2 days ago). Also states pt. Lives alone and that she took him to her home on 03/08/19 after being A[place] from hospital. Telephone consent obtained now secondary to pt. Unable to hold pen in his hand and lethargic. Rhonchi throughout lungs ant. & post. Given breathing treatment via mask. Pt. Tolerated well. Called 2A and gave Caryl Pina, RN update on pt. Status. . Unable to obtain pulses to LE bilat. Noted left hip "bruised " area approx. 50 Cent size. Pt. Right groin and penis (entire shaft), red, excoriated with breakdown noted. Pt. Moans with cleaning areas to groin and penis. Pt. Cleansed of incontinent urine x 1. Non productive occ. Cough pre vascular procedure. Marland Kitchen

## 2019-03-13 NOTE — ED Notes (Signed)
3 unsuccessful attempted for IV access.

## 2019-03-13 NOTE — ED Notes (Signed)
Per admitting provider, pt neurologically intact and vitals are appropriate for pt's condition. Provider states pt is appropriate to be transported to the floor at this time

## 2019-03-13 NOTE — ED Notes (Signed)
Pt's soiled brief changed, peri care performed by this RN and RN Mickel Baas. Significant sores to pt's testicles (right side) and to pt's penis and inner portion of thigh; barrier cream applied

## 2019-03-13 NOTE — Progress Notes (Signed)
Contacted by 1C Charge RN about concerns about patient's status/vs prior to coming to inpatient floor care unit. Await return call from [facility]

## 2019-03-13 NOTE — Telephone Encounter (Signed)
Appropriately triage to return to hospital EMS / ED care today based on acute worsening declining symptoms after leaving hospital.  He was anticipated to come in for evaluation to determine if needed home health nursing and PT support after leaving hospital as they have declined these services last time, however these updates are significant and concerning, and he would likely need to be treated again back at hospital as opposed to home care at this time.  Nobie Putnam, [facility] 03/13/2019, 12:17 PM

## 2019-03-13 NOTE — ED Notes (Signed)
Patient transported to CT 

## 2019-03-13 NOTE — OR Nursing (Signed)
Iv infusion for vascular case switched to NS for case, resumed multi additive fluid and NS stopped. Starting heparin infusion which is compatable with multi additive fluid per pharmacist.

## 2019-03-13 NOTE — Consult Note (Signed)
PHARMACY -  BRIEF ANTIBIOTIC NOTE   Pharmacy has received consult(s) for Vancomycin/Cefepime from an ED provider.    The patient's profile has been reviewed for ht/wt/allergies/indication/available labs.    One time order(s) placed for Vancomycin/Cefepime (by ED provider)  Further antibiotics/pharmacy consults should be ordered by admitting physician if indicated.                       Thank you,  Lu Duffel, PharmD, BCPS Clinical Pharmacist 03/13/2019 10:31 AM\

## 2019-03-13 NOTE — Chronic Care Management (AMB) (Signed)
  Chronic Care Management   Follow Up Note   03/13/2019 Name: Donald [name]. MRN: [id number] DOB: April 24, 1961  Referred by: Olin Hauser, DO Reason for referral : Care Coordination (LTC Placement, pt in ED)   Donald [name]. is a 58 y.o. year old male who is a primary care patient of Olin Hauser, DO. The CCM team was consulted for assistance with chronic disease management and care coordination needs.    Review of patient status, including review of consultants reports, relevant laboratory and other test results, and collaboration with appropriate care team members and the patient's provider was performed as part of comprehensive patient evaluation and provision of chronic care management services.    Goals Addressed            This Visit's Progress   . "I need a higher leve of care now."       Current Barriers:  . Financial constraints . Limited social support . Level of care concerns . ADL IADL limitations . Mental Health Concerns  . Substance abuse issues - alcohol . Limited education about LTC placement*  Clinical Social Work Clinical Goal(s):  Marland Kitchen Over the next 30 days, client will work with SW to address concerns related to getting patient placed into a skilled nursing facility.  Interventions: . Patient interviewed and appropriate assessments performed . Provided patient with information about LTC placement process . Discussed plans with patient for ongoing care management follow up and provided patient with direct contact information for care management team . Advised patient's family of her options in terms of securing placement now that family feel that patient IS NOT SAFE at home- Patient has had a PT eval but no FL2 or PASRR.  o Patient can go back to ED and seek immediate SNF placement due to safety concerns o [facility] LCSW can initiate the SNF placement process but this will take longer-Family does not wish to wait due to ongoing safety  concerns regarding patient Family is aware that securing SNF placement from a outpatient setting instead of inpatient takes more resources and time. Donald [name] with LPN Donald [name] and primary care provider re: Family plans to transport patient back to the ED today. If this plan changes, LCSW will initiate SNF placement from outpatient setting. . Assisted patient/caregiver with obtaining information about health plan benefits . Provided education to patient/caregiver regarding level of care options. . Review of EMR showed patient was in ED.  Marland Kitchen Email sent to [facility] team members at [facility] to alert them of patient's continued need for LTC placement.    Patient Self Care Activities:  . Currently UNABLE TO independently take care of self.  Please see past updates related to this goal by clicking on the "Past Updates" button in the selected goal          The care management team will reach out to the patient again over the next 30 days.    Donald [name] Donald Crossman RN, BSN Nurse Case Pharmacist, [facility]  [phone]) Business Mobile

## 2019-03-14 ENCOUNTER — Encounter: Payer: Self-pay | Admitting: Vascular Surgery

## 2019-03-14 DIAGNOSIS — Z9862 Peripheral vascular angioplasty status: Secondary | ICD-10-CM

## 2019-03-14 DIAGNOSIS — I70203 Unspecified atherosclerosis of native arteries of extremities, bilateral legs: Secondary | ICD-10-CM

## 2019-03-14 LAB — BASIC METABOLIC PANEL
Anion gap: 11 (ref 5–15)
Anion gap: 8 (ref 5–15)
BUN: 14 mg/dL (ref 6–20)
BUN: 15 mg/dL (ref 6–20)
CO2: 21 mmol/L — ABNORMAL LOW (ref 22–32)
CO2: 22 mmol/L (ref 22–32)
Calcium: 8.8 mg/dL — ABNORMAL LOW (ref 8.9–10.3)
Calcium: 8.9 mg/dL (ref 8.9–10.3)
Chloride: 112 mmol/L — ABNORMAL HIGH (ref 98–111)
Chloride: 115 mmol/L — ABNORMAL HIGH (ref 98–111)
Creatinine, Ser: 0.81 mg/dL (ref 0.61–1.24)
Creatinine, Ser: 0.86 mg/dL (ref 0.61–1.24)
GFR calc Af Amer: >60 mL/min (ref >60)
GFR calc Af Amer: >60 mL/min (ref >60)
GFR calc non Af Amer: >60 mL/min (ref >60)
GFR calc non Af Amer: >60 mL/min (ref >60)
Glucose, Bld: 110 mg/dL — ABNORMAL HIGH (ref 70–99)
Glucose, Bld: 130 mg/dL — ABNORMAL HIGH (ref 70–99)
Potassium: 2.8 mmol/L — ABNORMAL LOW (ref 3.5–5.1)
Potassium: 3.2 mmol/L — ABNORMAL LOW (ref 3.5–5.1)
Sodium: 144 mmol/L (ref 135–145)
Sodium: 145 mmol/L (ref 135–145)

## 2019-03-14 LAB — CBC WITH DIFFERENTIAL/PLATELET
Abs Immature Granulocytes: 0.13 10*3/uL — ABNORMAL HIGH (ref 0.00–0.07)
Basophils Absolute: 0 10*3/uL (ref 0.0–0.1)
Basophils Relative: 0 %
Eosinophils Absolute: 0 10*3/uL (ref 0.0–0.5)
Eosinophils Relative: 0 %
HCT: 37.3 % — ABNORMAL LOW (ref 39.0–52.0)
Hemoglobin: 12.8 g/dL — ABNORMAL LOW (ref 13.0–17.0)
Immature Granulocytes: 1 %
Lymphocytes Relative: 8 %
Lymphs Abs: 1.7 10*3/uL (ref 0.7–4.0)
MCH: 34.2 pg — ABNORMAL HIGH (ref 26.0–34.0)
MCHC: 34.3 g/dL (ref 30.0–36.0)
MCV: 99.7 fL (ref 80.0–100.0)
Monocytes Absolute: 1.4 10*3/uL — ABNORMAL HIGH (ref 0.1–1.0)
Monocytes Relative: 6 %
Neutro Abs: 18.8 10*3/uL — ABNORMAL HIGH (ref 1.7–7.7)
Neutrophils Relative %: 85 %
Platelets: 288 10*3/uL (ref 150–400)
RBC: 3.74 MIL/uL — ABNORMAL LOW (ref 4.22–5.81)
RDW: 13.8 % (ref 11.5–15.5)
WBC: 22 10*3/uL — ABNORMAL HIGH (ref 4.0–10.5)
nRBC: 0 % (ref 0.0–0.2)

## 2019-03-14 LAB — FOLATE: Folate: 16.5 ng/mL (ref >5.9)

## 2019-03-14 LAB — HEPARIN LEVEL (UNFRACTIONATED)
Heparin Unfractionated: 0.47 IU/mL (ref 0.30–0.70)
Heparin Unfractionated: 0.26 IU/mL — ABNORMAL LOW (ref 0.30–0.70)
Heparin Unfractionated: 0.53 IU/mL (ref 0.30–0.70)

## 2019-03-14 LAB — MAGNESIUM
Magnesium: 1.5 mg/dL — ABNORMAL LOW (ref 1.7–2.4)
Magnesium: 2 mg/dL (ref 1.7–2.4)

## 2019-03-14 LAB — IRON AND TIBC
Iron: 87 ug/dL (ref 45–182)
Saturation Ratios: 37 % (ref 17.9–39.5)
TIBC: 235 ug/dL — ABNORMAL LOW (ref 250–450)
UIBC: 148 ug/dL

## 2019-03-14 LAB — PHOSPHORUS: Phosphorus: 2.6 mg/dL (ref 2.5–4.6)

## 2019-03-14 LAB — VITAMIN B12: Vitamin B-12: 331 pg/mL (ref 180–914)

## 2019-03-14 LAB — LACTIC ACID, PLASMA: Lactic Acid, Venous: 1.6 mmol/L (ref 0.5–1.9)

## 2019-03-14 LAB — TRANSFERRIN: Transferrin: 191 mg/dL (ref 180–329)

## 2019-03-14 LAB — POTASSIUM: Potassium: 3.7 mmol/L (ref 3.5–5.1)

## 2019-03-14 LAB — FERRITIN: Ferritin: 683 ng/mL — ABNORMAL HIGH (ref 24–336)

## 2019-03-14 MED ORDER — THIAMINE HCL 100 MG/ML IJ SOLN
250.0000 mg | INTRAVENOUS | Status: AC
  Administered 2019-03-17 – 2019-03-20 (×5): 250 mg via INTRAVENOUS
  Filled 2019-03-14 (×5): qty 2.5

## 2019-03-14 MED ORDER — IPRATROPIUM-ALBUTEROL 0.5-2.5 (3) MG/3ML IN SOLN
3.0000 mL | Freq: Two times a day (BID) | RESPIRATORY_TRACT | Status: DC
  Administered 2019-03-14 – 2019-03-23 (×18): 3 mL via RESPIRATORY_TRACT
  Filled 2019-03-14 (×19): qty 3

## 2019-03-14 MED ORDER — MEGESTROL ACETATE 20 MG PO TABS
80.0000 mg | ORAL_TABLET | Freq: Every day | ORAL | Status: DC
  Administered 2019-03-14: 80 mg via ORAL
  Filled 2019-03-14: qty 4

## 2019-03-14 MED ORDER — POTASSIUM CHLORIDE CRYS ER 20 MEQ PO TBCR
40.0000 meq | EXTENDED_RELEASE_TABLET | Freq: Two times a day (BID) | ORAL | Status: DC
  Administered 2019-03-14 – 2019-03-21 (×13): 40 meq via ORAL
  Filled 2019-03-14 (×13): qty 2

## 2019-03-14 MED ORDER — POTASSIUM CHLORIDE 20 MEQ PO PACK
40.0000 meq | PACK | ORAL | Status: AC
  Administered 2019-03-14: 40 meq via ORAL
  Filled 2019-03-14: qty 2

## 2019-03-14 MED ORDER — THIAMINE HCL 100 MG/ML IJ SOLN
500.0000 mg | Freq: Three times a day (TID) | INTRAVENOUS | Status: AC
  Administered 2019-03-14 – 2019-03-16 (×5): 500 mg via INTRAVENOUS
  Filled 2019-03-14 (×7): qty 5

## 2019-03-14 MED ORDER — GERHARDT'S BUTT CREAM
TOPICAL_CREAM | Freq: Three times a day (TID) | CUTANEOUS | Status: DC
  Administered 2019-03-14 – 2019-03-24 (×28): via TOPICAL
  Filled 2019-03-14: qty 1

## 2019-03-14 MED ORDER — HEPARIN (PORCINE) 25000 UT/250ML-% IV SOLN
950.0000 [IU]/h | INTRAVENOUS | Status: DC
  Administered 2019-03-14 – 2019-03-16 (×2): 950 [IU]/h via INTRAVENOUS
  Filled 2019-03-14 (×2): qty 250

## 2019-03-14 MED ORDER — MEGESTROL ACETATE 400 MG/10ML PO SUSP
200.0000 mg | Freq: Two times a day (BID) | ORAL | Status: DC
  Administered 2019-03-14 – 2019-03-24 (×16): 200 mg via ORAL
  Filled 2019-03-14 (×21): qty 10

## 2019-03-14 MED ORDER — HEPARIN BOLUS VIA INFUSION
1400.0000 [IU] | Freq: Once | INTRAVENOUS | Status: AC
  Administered 2019-03-14: 1400 [IU] via INTRAVENOUS
  Filled 2019-03-14: qty 1400

## 2019-03-14 MED ORDER — METOPROLOL TARTRATE 5 MG/5ML IV SOLN
5.0000 mg | INTRAVENOUS | Status: AC
  Administered 2019-03-14: 5 mg via INTRAVENOUS
  Filled 2019-03-14: qty 5

## 2019-03-14 MED ORDER — VITAMIN B-1 100 MG PO TABS
100.0000 mg | ORAL_TABLET | Freq: Every day | ORAL | Status: DC
  Administered 2019-03-20 – 2019-03-24 (×5): 100 mg via ORAL
  Filled 2019-03-14 (×6): qty 1

## 2019-03-14 MED ORDER — VITAMIN C 500 MG PO TABS
250.0000 mg | ORAL_TABLET | Freq: Two times a day (BID) | ORAL | Status: DC
  Administered 2019-03-14 – 2019-03-24 (×18): 250 mg via ORAL
  Filled 2019-03-14 (×18): qty 1

## 2019-03-14 MED ORDER — VANCOMYCIN HCL IN DEXTROSE 1-5 GM/200ML-% IV SOLN
1000.0000 mg | INTRAVENOUS | Status: DC
  Administered 2019-03-15: 1000 mg via INTRAVENOUS
  Filled 2019-03-14 (×2): qty 200

## 2019-03-14 MED ORDER — HEPARIN BOLUS VIA INFUSION
700.0000 [IU] | Freq: Once | INTRAVENOUS | Status: AC
  Administered 2019-03-14: 700 [IU] via INTRAVENOUS
  Filled 2019-03-14: qty 700

## 2019-03-14 MED ORDER — SODIUM CHLORIDE 0.9 % IV SOLN
INTRAVENOUS | Status: DC
  Administered 2019-03-15 – 2019-03-21 (×13): via INTRAVENOUS

## 2019-03-14 MED ORDER — MAGNESIUM SULFATE 2 GM/50ML IV SOLN
2.0000 g | Freq: Once | INTRAVENOUS | Status: AC
  Administered 2019-03-14: 10:00:00 2 g via INTRAVENOUS
  Filled 2019-03-14: qty 50

## 2019-03-14 MED ORDER — POTASSIUM CHLORIDE 20 MEQ PO PACK
40.0000 meq | PACK | Freq: Once | ORAL | Status: AC
  Administered 2019-03-14: 40 meq via ORAL
  Filled 2019-03-14: qty 2

## 2019-03-14 MED ORDER — ENSURE ENLIVE PO LIQD
237.0000 mL | Freq: Three times a day (TID) | ORAL | Status: DC
  Administered 2019-03-14 – 2019-03-20 (×13): 237 mL via ORAL

## 2019-03-14 NOTE — Consult Note (Signed)
ANTICOAGULATION CONSULT NOTE - Initial Consult  Pharmacy Consult for Heparin Indication: aortic occlusion  No Known Allergies  Patient Measurements: Height: 5\' 9"  (175.3 cm) Weight: 108 lb 7.5 oz (49.2 kg) IBW/kg (Calculated) : 70.7 Heparin Dosing Weight: 49.2  Vital Signs: Temp: 97.6 F (36.4 C) (07/07 0355) Temp Source: Oral (07/07 0355) BP: 147/89 (07/07 0355) Pulse Rate: 122 (07/07 0355)  Labs: Recent Labs    03/13/19 0931 03/13/19 0956 03/13/19 2315 03/14/19 0501 03/14/19 0503  HGB 16.7  --   --  12.8*  --   HCT 47.9  --   --  37.3*  --   PLT 478*  --   --  288  --   APTT  --  31  --   --   --   LABPROT  --  14.0  --   --   --   INR  --  1.1  --   --   --   HEPARINUNFRC  --   --  0.14*  --  0.47  CREATININE 0.72  --   --  0.81  --     Estimated Creatinine Clearance: 71.7 mL/min (by C-G formula based on SCr of 0.81 mg/dL).   Medical History: Past Medical History:  Diagnosis Date  . Depression   . Emphysema of lung ([place])   . Essential hypertension   . History of chicken pox   . Low weight   . Neuropathy    bilateral lower legs  . Peripheral vascular disease ([place])   . Prostate disease   . Seasonal allergies   . Seizures ([place])   . Wears dentures    full upper    Medications:  Medications Prior to Admission  Medication Sig Dispense Refill Last Dose  . acetaminophen (TYLENOL) 500 MG tablet Take 500 mg by mouth every 6 (six) hours as needed.   03/11/2019  . albuterol (VENTOLIN HFA) 108 (90 Base) MCG/ACT inhaler Inhale 2 puffs into the lungs every 6 (six) hours as needed for wheezing or shortness of breath. 8.5 g 2 Unknown at PRN  . amLODipine (NORVASC) 10 MG tablet Take 1 tablet (10 mg total) by mouth daily. 30 tablet 0 03/12/2019 at 1000  . aspirin 81 MG tablet Take 1 tablet (81 mg total) by mouth daily. 30 tablet 11 03/08/2019 at Unknown time  . cloNIDine (CATAPRES) 0.1 MG tablet Take 1 tablet (0.1 mg total) by mouth 2 (two) times daily. 60 tablet 0  03/08/2019 at Unknown time  . feeding supplement, ENSURE ENLIVE, (ENSURE ENLIVE) LIQD Take 237 mLs by mouth 2 (two) times daily between meals. 14220 mL 0 03/11/2019  . Fluticasone-Salmeterol (ADVAIR) 250-50 MCG/DOSE AEPB Inhale 1 puff into the lungs 2 (two) times daily. 60 each 11 03/08/2019 at Unknown time  . megestrol (MEGACE) 40 MG tablet Take 2-4 tablets (80-160 mg total) by mouth daily. Start with 2 tablets a day, then gradually increase as tolerated up to 4 per day 120 tablet 0 Past Week at Unknown time  . metoprolol succinate (TOPROL-XL) 50 MG 24 hr tablet Take 1 tablet (50 mg total) by mouth daily. 90 tablet 1 03/08/2019 at Unknown time  . tamsulosin (FLOMAX) 0.4 MG CAPS capsule Take 1 capsule (0.4 mg total) by mouth daily after supper. 90 capsule 3 03/08/2019 at Unknown time  . chlordiazePOXIDE (LIBRIUM) 25 MG capsule 25 mg p.o. twice daily x2 days Then 25 mg p.o. daily x2 days (Patient not taking: Reported on 03/09/2019) 6 capsule 0 03/07/2019  at Unknown time   Scheduled:  . amLODipine  10 mg Oral Daily  . aspirin EC  81 mg Oral Daily  . Chlorhexidine Gluconate Cloth  6 each Topical Q0600  . cloNIDine  0.1 mg Oral BID  . feeding supplement (ENSURE ENLIVE)  237 mL Oral BID BM  . folic acid  1 mg Oral Daily  . ipratropium-albuterol  3 mL Nebulization Q6H  . megestrol  80-160 mg Oral Daily  . metoprolol succinate  50 mg Oral Daily  . multivitamin with minerals  1 tablet Oral Daily  . mupirocin ointment  1 application Nasal BID  . potassium chloride  40 mEq Oral BID  . tamsulosin  0.4 mg Oral QPC supper  . thiamine  100 mg Oral Daily   Infusions:  . sodium chloride 125 mL/hr at 03/14/19 0541  . ceFEPime (MAXIPIME) IV 2 g (03/14/19 0500)  . heparin 900 Units/hr (03/14/19 0027)  . vancomycin Stopped (03/14/19 0018)   PRN: HYDROmorphone (DILAUDID) injection, labetalol, LORazepam **OR** LORazepam, ondansetron (ZOFRAN) IV, sodium chloride flush  Assessment: Pharmacy consulted for heparin. No  DOAC PTA.   7/6 Heparin infusion started @ 750 units/hr  7/6 @ 2315 HL: 0.14. Level subtherapeutic.  Infusion increased to 900 units/hr.  7/7 @ 0503 HL: 0.47. Level is therapeutic x 1.   Goal of Therapy:  Heparin level 0.3-0.7 units/ml Monitor platelets by anticoagulation protocol: Yes   Plan:  7/7 @ 0503 HL: 0.47. Level is therapeutic x 1.    Continue current heparin infusion rate of  900 units/hr Recheck anti-Xa level in 6 hours and daily while on heparin Continue to monitor H&H and platelets  Pernell Dupre, PharmD, BCPS Clinical Pharmacist 03/14/2019 6:29 AM

## 2019-03-14 NOTE — Consult Note (Signed)
ANTICOAGULATION CONSULT NOTE - Initial Consult  Pharmacy Consult for Heparin Indication: aortic occlusion  No Known Allergies  Patient Measurements: Height: 5\' 9"  (175.3 cm) Weight: 108 lb 7.5 oz (49.2 kg) IBW/kg (Calculated) : 70.7 Heparin Dosing Weight: 49.2  Vital Signs: Temp: 98 F (36.7 C) (07/06 1921) Temp Source: Oral (07/06 1921) BP: 131/99 (07/06 1921) Pulse Rate: 112 (07/06 1921)  Labs: Recent Labs    03/13/19 0931 03/13/19 0956 03/13/19 2315  HGB 16.7  --   --   HCT 47.9  --   --   PLT 478*  --   --   APTT  --  31  --   LABPROT  --  14.0  --   INR  --  1.1  --   HEPARINUNFRC  --   --  0.14*  CREATININE 0.72  --   --     Estimated Creatinine Clearance: 72.6 mL/min (by C-G formula based on SCr of 0.72 mg/dL).   Medical History: Past Medical History:  Diagnosis Date  . Depression   . Emphysema of lung ([place])   . Essential hypertension   . History of chicken pox   . Low weight   . Neuropathy    bilateral lower legs  . Peripheral vascular disease ([place])   . Prostate disease   . Seasonal allergies   . Seizures ([place])   . Wears dentures    full upper    Medications:  Medications Prior to Admission  Medication Sig Dispense Refill Last Dose  . acetaminophen (TYLENOL) 500 MG tablet Take 500 mg by mouth every 6 (six) hours as needed.   03/11/2019  . albuterol (VENTOLIN HFA) 108 (90 Base) MCG/ACT inhaler Inhale 2 puffs into the lungs every 6 (six) hours as needed for wheezing or shortness of breath. 8.5 g 2 Unknown at PRN  . amLODipine (NORVASC) 10 MG tablet Take 1 tablet (10 mg total) by mouth daily. 30 tablet 0 03/12/2019 at 1000  . aspirin 81 MG tablet Take 1 tablet (81 mg total) by mouth daily. 30 tablet 11 03/08/2019 at Unknown time  . cloNIDine (CATAPRES) 0.1 MG tablet Take 1 tablet (0.1 mg total) by mouth 2 (two) times daily. 60 tablet 0 03/08/2019 at Unknown time  . feeding supplement, ENSURE ENLIVE, (ENSURE ENLIVE) LIQD Take 237 mLs by mouth 2 (two) times  daily between meals. 14220 mL 0 03/11/2019  . Fluticasone-Salmeterol (ADVAIR) 250-50 MCG/DOSE AEPB Inhale 1 puff into the lungs 2 (two) times daily. 60 each 11 03/08/2019 at Unknown time  . megestrol (MEGACE) 40 MG tablet Take 2-4 tablets (80-160 mg total) by mouth daily. Start with 2 tablets a day, then gradually increase as tolerated up to 4 per day 120 tablet 0 Past Week at Unknown time  . metoprolol succinate (TOPROL-XL) 50 MG 24 hr tablet Take 1 tablet (50 mg total) by mouth daily. 90 tablet 1 03/08/2019 at Unknown time  . tamsulosin (FLOMAX) 0.4 MG CAPS capsule Take 1 capsule (0.4 mg total) by mouth daily after supper. 90 capsule 3 03/08/2019 at Unknown time  . chlordiazePOXIDE (LIBRIUM) 25 MG capsule 25 mg p.o. twice daily x2 days Then 25 mg p.o. daily x2 days (Patient not taking: Reported on 03/09/2019) 6 capsule 0 03/07/2019 at Unknown time   Scheduled:  . amLODipine  10 mg Oral Daily  . aspirin EC  81 mg Oral Daily  . Chlorhexidine Gluconate Cloth  6 each Topical Q0600  . cloNIDine  0.1 mg Oral BID  .  feeding supplement (ENSURE ENLIVE)  237 mL Oral BID BM  . fentaNYL      . folic acid  1 mg Oral Daily  . heparin      . ipratropium-albuterol  3 mL Nebulization Q6H  . megestrol  80-160 mg Oral Daily  . metoprolol succinate  50 mg Oral Daily  . midazolam      . multivitamin with minerals  1 tablet Oral Daily  . mupirocin ointment  1 application Nasal BID  . potassium chloride  40 mEq Oral BID  . tamsulosin  0.4 mg Oral QPC supper  . thiamine  100 mg Oral Daily   Infusions:  . sodium chloride 125 mL/hr at 03/13/19 1900  . ceFEPime (MAXIPIME) IV 2 g (03/13/19 2157)  . heparin 750 Units/hr (03/13/19 1746)  . vancomycin 750 mg (03/13/19 2315)   PRN: HYDROmorphone (DILAUDID) injection, labetalol, LORazepam **OR** LORazepam, ondansetron (ZOFRAN) IV, sodium chloride flush  Assessment: Pharmacy consulted for heparin. No DOAC PTA.   7/6 Heparin infusion started @ 750 units/hr  7/6 @ 2315  HL: 0.14. Level subtherapeutic.    Goal of Therapy:  Heparin level 0.3-0.7 units/ml Monitor platelets by anticoagulation protocol: Yes   Plan:  7/6 @ 2315 HL: 0.14. Level subtherapeutic.   Will order Heparin 1400 unit bolus and increase infusion rate to 900 units/hr Recheck anti-Xa level in 6 hours and daily while on heparin Continue to monitor H&H and platelets  Pernell Dupre, PharmD, BCPS Clinical Pharmacist 03/14/2019 12:24 AM

## 2019-03-14 NOTE — Progress Notes (Signed)
Dr paged about patient Potassium 2.8. New orders given, will continue to monitor.

## 2019-03-14 NOTE — Progress Notes (Signed)
Initial Nutrition Assessment  RD working remotely.  DOCUMENTATION CODES:   Severe malnutrition in context of chronic illness  INTERVENTION:   Provide Ensure Enlive po TID, each supplement provides 350 kcal and 20 grams of protein.  Continue MVI and folic acid daily in setting of etoh abuse.  Thiamine 500 mg intravenously, infused over 30 minutes, three times daily for two consecutive days and 250 mg intravenously once daily for an additional five days  Vitamin C 250mg  po BID  Dysphagia 3 diet   Check iron/anemia, B12, vitamin D and folate labs   Monitor potassium, magnesium, and phosphorus as patient is at risk for refeeding syndrome.  NUTRITION DIAGNOSIS:   Severe Malnutrition related to chronic illness(emphysema, etoh abuse) as evidenced by severe muscle depletion, severe fat depletion.  GOAL:   Patient will meet greater than or equal to 90% of their needs  MONITOR:   PO intake, Supplement acceptance, Labs, Weight trends, Skin, I & O's  REASON FOR ASSESSMENT:   Consult Assessment of nutrition requirement/status  ASSESSMENT:   58 y.o. male with pertinent past medical history of emphysema, hypertension, BPH, former tobacco abuse, alcohol abuse, peripheral neuropathy, PVD, anemia, sensory ataxia, colon cancer, depression, hypertension, and seizure presenting to the ED with altered mental status.   Pt with AMS. Pt familiar to nutrition department from recent previous admit. Pt reported at that time that he has had a decreased appetite for the past 2-3 weeks. He reported this is related to pain in his legs. He reports he is still attempting to eat 2-3 meals per day but is eating less at meals. Pt is currently eating <25% of meals in hospital. RD will add supplements to help pt his estimated needs. Will give high dose IV thiamine as pt is at increased risk for wernicke's. Will also check vitamin labs.   Patient reported his UBW is 125 lbs and that he has lost down to 110 lbs  over the past few weeks. Per chart he was 54.7 kg on 01/12/2019. He is now 49.2 kg (108.47 lbs). He has lost 5.5 kg (10.1% body weight) over the past almost 2 months, which is significant for time frame.  Medications reviewed and include: aspirin, folic acid, megace, MVI, KCl, thiamine, NaCl @125ml /hr, cefepime, heparin, vancomycin   Labs reviewed: K 2.8(L), Mg 1.5(L), P 2.6 wnl Wbc- 22.0(H), MCH 34.2(H)  NUTRITION - FOCUSED PHYSICAL EXAM:    Most Recent Value  Orbital Region  Severe depletion  Upper Arm Region  Severe depletion  Thoracic and Lumbar Region  Severe depletion  Buccal Region  Severe depletion  Temple Region  Moderate depletion  Clavicle Bone Region  Severe depletion  Clavicle and Acromion Bone Region  Severe depletion  Scapular Bone Region  Severe depletion  Dorsal Hand  Severe depletion  Patellar Region  Severe depletion  Anterior Thigh Region  Severe depletion  Posterior Calf Region  Severe depletion  Edema (RD Assessment)  None  Hair  Reviewed  Eyes  Reviewed  Mouth  Reviewed  Skin  Reviewed  Nails  Reviewed     Diet Order:   Diet Order            Diet NPO time specified  Diet effective midnight        DIET DYS 3 Room service appropriate? Yes; Fluid consistency: Thin  Diet effective now             EDUCATION NEEDS:   Education needs have been addressed  Skin:  Skin  Assessment: Reviewed RN Assessment(Coccyx: 1cm x 0.5cm x 0.2cm with wound bed partially obscured by the presence of yellow fibrinous slough. Stage 3, incision groin)  Last BM:  7/1  Height:   Ht Readings from Last 1 Encounters:  03/13/19 5\' 9"  (1.753 m)   Weight:   Wt Readings from Last 1 Encounters:  03/13/19 49.2 kg   Ideal Body Weight:  72.7 kg  BMI:  Body mass index is 16.02 kg/m.  Estimated Nutritional Needs:   Kcal:  1700-2000kcal/day  Protein:  85-100g/day  Fluid:  >1.5L/day  Koleen Distance MS, RD, LDN Pager #- [phone] Office#- [phone] After Hours  Pager: [phone]

## 2019-03-14 NOTE — Consult Note (Signed)
ANTICOAGULATION CONSULT NOTE - Initial Consult  Pharmacy Consult for Heparin Indication: aortic occlusion  No Known Allergies  Patient Measurements: Height: 5\' 9"  (175.3 cm) Weight: 108 lb 7.5 oz (49.2 kg) IBW/kg (Calculated) : 70.7 Heparin Dosing Weight: 49.2  Vital Signs: Temp: 97.8 F (36.6 C) (07/07 1210) Temp Source: Oral (07/07 1210) BP: 160/96 (07/07 1212) Pulse Rate: 116 (07/07 1212)  Labs: Recent Labs    03/13/19 0931 03/13/19 0956 03/13/19 2315 03/14/19 0501 03/14/19 0503 03/14/19 1229  HGB 16.7  --   --  12.8*  --   --   HCT 47.9  --   --  37.3*  --   --   PLT 478*  --   --  288  --   --   APTT  --  31  --   --   --   --   LABPROT  --  14.0  --   --   --   --   INR  --  1.1  --   --   --   --   HEPARINUNFRC  --   --  0.14*  --  0.47 0.26*  CREATININE 0.72  --   --  0.81  --   --     Estimated Creatinine Clearance: 71.7 mL/min (by C-G formula based on SCr of 0.81 mg/dL).   Medical History: Past Medical History:  Diagnosis Date  . Depression   . Emphysema of lung ([place])   . Essential hypertension   . History of chicken pox   . Low weight   . Neuropathy    bilateral lower legs  . Peripheral vascular disease ([place])   . Prostate disease   . Seasonal allergies   . Seizures ([place])   . Wears dentures    full upper    Medications:  Medications Prior to Admission  Medication Sig Dispense Refill Last Dose  . acetaminophen (TYLENOL) 500 MG tablet Take 500 mg by mouth every 6 (six) hours as needed.   03/11/2019  . albuterol (VENTOLIN HFA) 108 (90 Base) MCG/ACT inhaler Inhale 2 puffs into the lungs every 6 (six) hours as needed for wheezing or shortness of breath. 8.5 g 2 Unknown at PRN  . amLODipine (NORVASC) 10 MG tablet Take 1 tablet (10 mg total) by mouth daily. 30 tablet 0 03/12/2019 at 1000  . aspirin 81 MG tablet Take 1 tablet (81 mg total) by mouth daily. 30 tablet 11 03/08/2019 at Unknown time  . cloNIDine (CATAPRES) 0.1 MG tablet Take 1 tablet (0.1 mg  total) by mouth 2 (two) times daily. 60 tablet 0 03/08/2019 at Unknown time  . feeding supplement, ENSURE ENLIVE, (ENSURE ENLIVE) LIQD Take 237 mLs by mouth 2 (two) times daily between meals. 14220 mL 0 03/11/2019  . Fluticasone-Salmeterol (ADVAIR) 250-50 MCG/DOSE AEPB Inhale 1 puff into the lungs 2 (two) times daily. 60 each 11 03/08/2019 at Unknown time  . megestrol (MEGACE) 40 MG tablet Take 2-4 tablets (80-160 mg total) by mouth daily. Start with 2 tablets a day, then gradually increase as tolerated up to 4 per day 120 tablet 0 Past Week at Unknown time  . metoprolol succinate (TOPROL-XL) 50 MG 24 hr tablet Take 1 tablet (50 mg total) by mouth daily. 90 tablet 1 03/08/2019 at Unknown time  . tamsulosin (FLOMAX) 0.4 MG CAPS capsule Take 1 capsule (0.4 mg total) by mouth daily after supper. 90 capsule 3 03/08/2019 at Unknown time  . chlordiazePOXIDE (LIBRIUM) 25 MG capsule  25 mg p.o. twice daily x2 days Then 25 mg p.o. daily x2 days (Patient not taking: Reported on 03/09/2019) 6 capsule 0 03/07/2019 at Unknown time   Scheduled:  . amLODipine  10 mg Oral Daily  . aspirin EC  81 mg Oral Daily  . Chlorhexidine Gluconate Cloth  6 each Topical Q0600  . cloNIDine  0.1 mg Oral BID  . feeding supplement (ENSURE ENLIVE)  237 mL Oral TID BM  . folic acid  1 mg Oral Daily  . Gerhardt's butt cream   Topical TID  . ipratropium-albuterol  3 mL Nebulization BID  . megestrol  200 mg Oral BID  . metoprolol succinate  50 mg Oral Daily  . multivitamin with minerals  1 tablet Oral Daily  . mupirocin ointment  1 application Nasal BID  . potassium chloride  40 mEq Oral BID  . tamsulosin  0.4 mg Oral QPC supper  . [START ON 03/20/2019] thiamine  100 mg Oral Daily  . vitamin C  250 mg Oral BID   Infusions:  . sodium chloride 125 mL/hr at 03/14/19 0541  . [START ON 03/15/2019] sodium chloride    . ceFEPime (MAXIPIME) IV 2 g (03/14/19 0500)  . heparin    . [START ON 03/17/2019] thiamine injection    . thiamine injection     . vancomycin 750 mg (03/14/19 1205)   PRN: HYDROmorphone (DILAUDID) injection, labetalol, LORazepam **OR** LORazepam, ondansetron (ZOFRAN) IV, sodium chloride flush  Assessment: Pharmacy consulted for heparin. No DOAC PTA.   7/6 Heparin infusion started @ 750 units/hr  7/6 @ 2315 HL: 0.14. Level subtherapeutic.  Infusion increased to 900 units/hr.  7/7 @ 0503 HL: 0.47. Level is therapeutic x 1- heparin infusion rate of  900 units/hr. 7/7 @ 1229 HL: 0.26. Level is subtherapeutic. Confirmed with nurse, no interruptions.    Goal of Therapy:  Heparin level 0.3-0.7 units/ml Monitor platelets by anticoagulation protocol: Yes   Plan:  Will order bolus 700 units and increase rate to  Continue current heparin infusion rate of  950 units/hr Recheck anti-Xa level in 6 hours and daily while on heparin Continue to monitor H&H and platelets  Rowland Lathe, PharmD Clinical Pharmacist 03/14/2019 1:27 PM

## 2019-03-14 NOTE — TOC Initial Note (Signed)
Transition of Care (TOC) - Initial/Assessment Note    Patient Details  Name: Donald [name]. MRN: [id number] Date of Birth: March 08, 1961  Transition of Care [facility]) CM/SW Contact:    Shade Flood, LCSW Phone Number: 03/14/2019, 9:49 AM  Clinical Narrative:                  Pt admitted from his mother's home. Pt was recently discharged from [facility] (03/08/2019). His mother took him to her house after that discharge.   TOC consulted to assist with placement needs. MD notes indicate pt with possible pneumonia, UTI, and sepsis.   Pt unable to participate in [facility] assessment today. Spoke with pt's mother by phone to assess. Pt's mother informs TOC that she had to take pt to her home at previous dc as pt was unable to care for himself. She states that he has gotten progressively worse since that time to the point where he couldn't walk and she couldn't manage his care. At previous dc, HH had been recommended but pt/family declined. At this time, pt's mother states that they would like to pursue SNF rehab placement.  Will refer to requested facilities and follow for TOC needs.    Expected Discharge Plan: Skilled Nursing Facility Barriers to Discharge: Continued Medical Work up   Patient Goals and CMS Choice   CMS Medicare.gov Compare Post Acute Care list provided to:: Patient Represenative (must comment)(mother) Choice offered to / list presented to : Parent  Expected Discharge Plan and Services Expected Discharge Plan: [place] In-house Referral: Clinical Social Work   Post Acute Care Choice: [place] Living arrangements for the past 2 months: [place]                                      Prior Living Arrangements/Services Living arrangements for the past 2 months: Single Family Home Lives with:: Self, Parents Patient language and need for interpreter reviewed:: Yes Do you feel safe going back to the place where you live?: No   Pt's  mother states pt needs short term nursing home care  Need for Family Participation in Patient Care: Yes (Comment) Care giver support system in place?: Yes (comment) Current home services: DME Criminal Activity/Legal Involvement Pertinent to Current Situation/Hospitalization: No - Comment as needed  Activities of Daily Living Home Assistive Devices/Equipment: Gilford Rile (specify type), Cane (specify quad or straight) ADL Screening (condition at time of admission) Patient's cognitive ability adequate to safely complete daily activities?: No(since 03/08/19) Does the patient have difficulty seeing, even when wearing glasses/contacts?: Yes(reading glasses) Does the patient have difficulty concentrating, remembering, or making decisions?: Yes(since 03/08/19) Patient able to express need for assistance with ADLs?: Yes(DC'd home on 03/08/19 to his home: unable to care for self) Communication: Needs assistance Dressing (OT): Needs assistance Grooming: Needs assistance Feeding: Needs assistance Bathing: Needs assistance Toileting: Needs assistance Is this a change from baseline?: (change from DC home to mother on 03/08/19) Walks in Home: Needs assistance Does the patient have difficulty walking or climbing stairs?: Yes Weakness of Legs: Right Weakness of Arms/Hands: None  Permission Sought/Granted Permission sought to share information with : Chartered certified accountant granted to share information with : Yes, Verbal Permission Granted     Permission granted to share info w AGENCY: SNFs in [place], [place], [place], [place] counties        Emotional Assessment Appearance:: Appears stated  age Attitude/Demeanor/Rapport: Unable to Assess Affect (typically observed): Unable to Assess Orientation: : Oriented to Self Alcohol / Substance Use: Alcohol Use(No alcohol in blood at this admission) Psych Involvement: No (comment)  Admission diagnosis:  Dehydration  [E86.0] Healthcare-associated pneumonia [J18.9] Acute cystitis without hematuria [N30.00] Sepsis, due to unspecified organism, unspecified whether acute organ dysfunction present ([place]) [A41.9] Sepsis due to urinary tract infection ([place]) [A41.9, N39.0] Patient Active Problem List   Diagnosis Date Noted  . Sepsis due to urinary tract infection ([place]) 03/13/2019  . Protein-calorie malnutrition, severe 03/06/2019  . Pressure injury of skin 03/05/2019  . DTs (delirium tremens) ([place]) 03/04/2019  . Protein calorie malnutrition ([place]) 04/12/2018  . Insomnia 04/12/2018  . Low weight 07/06/2017  . Environmental and seasonal allergies 12/29/2016  . Special screening for malignant neoplasms, colon   . Benign neoplasm of cecum   . Benign neoplasm of transverse colon   . Benign neoplasm of sigmoid colon   . Alcohol dependence, in remission ([place]) 01/20/2016  . Mononeuropathy of both lower extremities 01/20/2016  . 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 Hyponatremia 11/14/2015  . Peripheral neuropathy 07/29/2015  . Essential hypertension 06/20/2015  . Benign prostatic hyperplasia 06/20/2015  . PAD (peripheral artery disease) ([place]) 06/20/2015  . Former smoker 06/20/2015  . Centrilobular emphysema ([place]) 06/20/2015   PCP:  Olin Hauser, DO Pharmacy:   [place], [place] - [address] [place] [address] Phone: [phone] Fax: [phone]     Social Determinants of Health (SDOH) Interventions    Readmission Risk Interventions Readmission Risk Prevention Plan 03/14/2019  Transportation Screening Complete  Home Care Screening Not Complete  Home Care Screening Not Completed Comments Pt needs SNF placement  Medication Review (RN CM) Complete  Some recent data might be hidden

## 2019-03-14 NOTE — Consult Note (Signed)
Pharmacy Antibiotic Note  Donald [name]. is a 58 y.o. male admitted on 03/13/2019 with sepsis.  Pharmacy has been consulted for cefepime and vancomycin dosing.  Plan: 1- Continue Cefepime 2g q8H   2-Will order vancomycin 1000 mg q24H for a predicted AUC of 441.7. Goal AUC is 400-550. Plan to order vancomycin level in 4-5 days.   Height: 5\' 9"  (175.3 cm) Weight: 108 lb 7.5 oz (49.2 kg) IBW/kg (Calculated) : 70.7  Temp (24hrs), Avg:97.7 F (36.5 C), Min:97.5 F (36.4 C), Max:98 F (36.7 C)  Recent Labs  Lab 03/08/19 0435 03/13/19 0931 03/13/19 1039 03/13/19 1207 03/14/19 0501 03/14/19 0847  WBC 10.2 21.5*  --   --  22.0*  --   CREATININE 0.40* 0.72  --   --  0.81  --   LATICACIDVEN  --   --  2.3* 2.3*  --  1.6    Estimated Creatinine Clearance: 71.7 mL/min (by C-G formula based on SCr of 0.81 mg/dL).    No Known Allergies  Antimicrobials this admission: 7/6 cefepime >>  7/6 vancomycin >>   Dose adjustments this admission: None  Microbiology results: 7/6 BCx: NGTD 7/6 UCx: STAPHYLOCOCCUS EPIDERMIDIS  Thank you for allowing pharmacy to be a part of this patient's care.  Rowland Lathe, PharmD 03/14/2019 9:49 AM

## 2019-03-14 NOTE — Consult Note (Signed)
[place] Nurse wound consult note Reason for Consult: partial thickness tissue loss to penile shaft, scrotum, bilateral inguinal areas. Blanching erythema to right hip, Stage 3 PI to coccyx. Patent is contrasted, nonverbal to me and incontinent of both bowel and bladder.  Wound type:moisture, pressure Pressure Injury POA: Yes Measurement: Coccyx: 1cm x 0.5cm x 0.2cm with wound bed partially obscured by the presence of yellow fibrinous slough. Stage 3 Wound bed:As described above Drainage (amount, consistency, odor) scant serous Periwound: erythematous, blanching Dressing procedure/placement/frequency: I will provide a mattress replacement with low air loss feature, patient is being turned and repositioned frequently (at least every 2 hours). Preventive silicone foam dressings to bilateral feet. Patient cannot wear external male catheter due to partial thickness erosions. We will treat topically with Gerhart's Butt Cream, a compounded 1:1:1 preparation of lotrimin, hydrocortisone and zinc oxide. A pillow is to be placed between the patient's knees to promote airflow. [place] nursing team will not follow, but will remain available to this patient, the nursing and medical teams.  Please re-consult if needed. Thanks, Maudie Flakes, MSN, RN, [place], Arther Abbott  Pager# [phone]

## 2019-03-14 NOTE — Consult Note (Signed)
ANTICOAGULATION CONSULT NOTE - Initial Consult  Pharmacy Consult for Heparin Indication: aortic occlusion  No Known Allergies  Patient Measurements: Height: 5\' 9"  (175.3 cm) Weight: 108 lb 7.5 oz (49.2 kg) IBW/kg (Calculated) : 70.7 Heparin Dosing Weight: 49.2  Vital Signs: Temp: 97.8 F (36.6 C) (07/07 1210) Temp Source: Oral (07/07 1210) BP: 153/90 (07/07 1800) Pulse Rate: 128 (07/07 1800)  Labs: Recent Labs    03/13/19 0931 03/13/19 0956  03/14/19 0501 03/14/19 0503 03/14/19 1229 03/14/19 1857  HGB 16.7  --   --  12.8*  --   --   --   HCT 47.9  --   --  37.3*  --   --   --   PLT 478*  --   --  288  --   --   --   APTT  --  31  --   --   --   --   --   LABPROT  --  14.0  --   --   --   --   --   INR  --  1.1  --   --   --   --   --   HEPARINUNFRC  --   --    < >  --  0.47 0.26* 0.53  CREATININE 0.72  --   --  0.81  --   --   --    < > = values in this interval not displayed.    Estimated Creatinine Clearance: 71.7 mL/min (by C-G formula based on SCr of 0.81 mg/dL).   Medical History: Past Medical History:  Diagnosis Date  . Depression   . Emphysema of lung ([place])   . Essential hypertension   . History of chicken pox   . Low weight   . Neuropathy    bilateral lower legs  . Peripheral vascular disease ([place])   . Prostate disease   . Seasonal allergies   . Seizures ([place])   . Wears dentures    full upper    Medications:  Medications Prior to Admission  Medication Sig Dispense Refill Last Dose  . acetaminophen (TYLENOL) 500 MG tablet Take 500 mg by mouth every 6 (six) hours as needed.   03/11/2019  . albuterol (VENTOLIN HFA) 108 (90 Base) MCG/ACT inhaler Inhale 2 puffs into the lungs every 6 (six) hours as needed for wheezing or shortness of breath. 8.5 g 2 Unknown at PRN  . amLODipine (NORVASC) 10 MG tablet Take 1 tablet (10 mg total) by mouth daily. 30 tablet 0 03/12/2019 at 1000  . aspirin 81 MG tablet Take 1 tablet (81 mg total) by mouth daily. 30 tablet  11 03/08/2019 at Unknown time  . cloNIDine (CATAPRES) 0.1 MG tablet Take 1 tablet (0.1 mg total) by mouth 2 (two) times daily. 60 tablet 0 03/08/2019 at Unknown time  . feeding supplement, ENSURE ENLIVE, (ENSURE ENLIVE) LIQD Take 237 mLs by mouth 2 (two) times daily between meals. 14220 mL 0 03/11/2019  . Fluticasone-Salmeterol (ADVAIR) 250-50 MCG/DOSE AEPB Inhale 1 puff into the lungs 2 (two) times daily. 60 each 11 03/08/2019 at Unknown time  . megestrol (MEGACE) 40 MG tablet Take 2-4 tablets (80-160 mg total) by mouth daily. Start with 2 tablets a day, then gradually increase as tolerated up to 4 per day 120 tablet 0 Past Week at Unknown time  . metoprolol succinate (TOPROL-XL) 50 MG 24 hr tablet Take 1 tablet (50 mg total) by mouth daily. 90 tablet 1  03/08/2019 at Unknown time  . tamsulosin (FLOMAX) 0.4 MG CAPS capsule Take 1 capsule (0.4 mg total) by mouth daily after supper. 90 capsule 3 03/08/2019 at Unknown time  . chlordiazePOXIDE (LIBRIUM) 25 MG capsule 25 mg p.o. twice daily x2 days Then 25 mg p.o. daily x2 days (Patient not taking: Reported on 03/09/2019) 6 capsule 0 03/07/2019 at Unknown time   Scheduled:  . amLODipine  10 mg Oral Daily  . aspirin EC  81 mg Oral Daily  . Chlorhexidine Gluconate Cloth  6 each Topical Q0600  . cloNIDine  0.1 mg Oral BID  . feeding supplement (ENSURE ENLIVE)  237 mL Oral TID BM  . folic acid  1 mg Oral Daily  . Gerhardt's butt cream   Topical TID  . ipratropium-albuterol  3 mL Nebulization BID  . megestrol  200 mg Oral BID  . metoprolol succinate  50 mg Oral Daily  . multivitamin with minerals  1 tablet Oral Daily  . mupirocin ointment  1 application Nasal BID  . potassium chloride  40 mEq Oral BID  . tamsulosin  0.4 mg Oral QPC supper  . [START ON 03/20/2019] thiamine  100 mg Oral Daily  . vitamin C  250 mg Oral BID   Infusions:  . sodium chloride 125 mL/hr at 03/14/19 1612  . [START ON 03/15/2019] sodium chloride    . ceFEPime (MAXIPIME) IV Stopped (03/14/19  1443)  . heparin 950 Units/hr (03/14/19 1611)  . [START ON 03/17/2019] thiamine injection    . thiamine injection 500 mg (03/14/19 1612)  . [START ON 03/15/2019] vancomycin     PRN: HYDROmorphone (DILAUDID) injection, labetalol, LORazepam **OR** LORazepam, ondansetron (ZOFRAN) IV, sodium chloride flush  Assessment: Pharmacy consulted for heparin. No DOAC PTA.   7/6 Heparin infusion started @ 750 units/hr  7/6 @ 2315 HL: 0.14. Level subtherapeutic.  Infusion increased to 900 units/hr.  7/7 @ 0503 HL: 0.47. Level is therapeutic x 1- heparin infusion rate of  900 units/hr. 7/7 @ 1229 HL: 0.26. Level is subtherapeutic. 700 unit bolus and rate increased to 950 units/hr.  7/7 @ 1857 HL: 0.53.    Goal of Therapy:  Heparin level 0.3-0.7 units/ml Monitor platelets by anticoagulation protocol: Yes   Plan:  Heparin level therapeutic.  Continue current heparin infusion rate at 950 units/hr Recheck anti-Xa level in 6 hours and daily while on heparin Continue to monitor H&H and platelets  Oswald Hillock, PharmD, BCPS Clinical Pharmacist 03/14/2019 7:39 PM

## 2019-03-14 NOTE — Progress Notes (Addendum)
[facility] [place] NAME: Donald [name]    MR#:  [id number]  DATE OF BIRTH:  1961-02-19  SUBJECTIVE:  CHIEF COMPLAINT:   Chief Complaint  Patient presents with  . Altered Mental Status   Patient confused and only oriented to self. Report his legs still hurt, but no other complaints at this time. Nurse reports that he did not sleep all night.   REVIEW OF SYSTEMS:  Review of Systems  Respiratory: Negative for shortness of breath.   Cardiovascular: Negative for chest pain and leg swelling.  Musculoskeletal:       Leg pain     DRUG ALLERGIES:  No Known Allergies VITALS:  Blood pressure (!) 147/89, pulse (!) 122, temperature 97.6 F (36.4 C), temperature source Oral, resp. rate 16, height 5\' 9"  (1.753 m), weight 49.2 kg, SpO2 95 %. PHYSICAL EXAMINATION:  Physical Exam Vitals signs reviewed.  Constitutional:      Comments: Thin, frail with muscle wasting  HENT:     Head: Normocephalic and atraumatic.  Pulmonary:     Effort: Pulmonary effort is normal.  Abdominal:     General: Abdomen is flat.     Palpations: Abdomen is soft.  Skin:    General: Skin is warm.     Comments: sacral ulcer with dressing applied Multiple scrotal and penile ulcerations noted   Neurological:     General: No focal deficit present.     Mental Status: He is alert.     Comments: A&O to self only    LABORATORY PANEL:  Male CBC Recent Labs  Lab 03/14/19 0501  WBC 22.0*  HGB 12.8*  HCT 37.3*  PLT 288   ------------------------------------------------------------------------------------------------------------------ Chemistries  Recent Labs  Lab 03/13/19 0931 03/14/19 0501  NA 146* 145  K 3.1* 2.8*  CL 110 112*  CO2 21* 22  GLUCOSE 140* 110*  BUN 18 14  CREATININE 0.72 0.81  CALCIUM 10.5* 8.8*  MG  --  1.5*  AST 86*  --   ALT 29  --   ALKPHOS 87  --   BILITOT 1.8*  --    RADIOLOGY:  Ct Head Wo Contrast  Result Date: 03/13/2019  CLINICAL DATA:  Lucent a shins, altered level of consciousness. EXAM: CT HEAD WITHOUT CONTRAST TECHNIQUE: Contiguous axial images were obtained from the base of the skull through the vertex without intravenous contrast. COMPARISON:  Head CT dated 03/04/2019. FINDINGS: Brain: Generalized parenchymal volume loss with commensurate dilatation of the ventricles and sulci. Chronic small vessel ischemic changes again noted within the bilateral periventricular and subcortical white matter regions. Old lacunar infarcts again noted within each basal ganglia region. No mass, hemorrhage, edema or other evidence of acute parenchymal abnormality. No extra-axial hemorrhage. Vascular: Chronic calcified atherosclerotic changes of the large vessels at the skull base. No unexpected hyperdense vessel. Skull: Normal. Negative for fracture or focal lesion. Sinuses/Orbits: No acute finding. Other: None. IMPRESSION: 1. No acute findings. No intracranial mass, hemorrhage or edema. 2. Atrophy and chronic ischemic changes, as detailed above. Electronically Signed   By: Franki Cabot M.D.   On: 03/13/2019 12:12   Dg Chest Portable 1 View  Result Date: 03/13/2019 CLINICAL DATA:  Pt comes into the ED via EMS from home, EMS reports pt lives with his mother and was here last week for detox, states he has been having hallucinations, not eating well ; Tachycardia Former smoker EXAM: PORTABLE CHEST 1 VIEW COMPARISON:  06/15/2016, 11/18/2011 and CT  of the chest on 02/15/2014 FINDINGS: Heart size is normal. The lungs are hyperinflated. There is atherosclerotic calcification of the thoracic aorta. There is perihilar peribronchial thickening. Stable scarring in the RIGHT UPPER lobe. There is increased streaky opacity in the MEDIAL LEFT lung base, consistent with developing infiltrate or significant atelectasis. There is no pulmonary edema. IMPRESSION: 1. Hyperinflation and bronchitic changes. 2. Developing atelectasis or infiltrate in the MEDIAL LEFT  lung base. Electronically Signed   By: Nolon Nations M.D.   On: 03/13/2019 09:55   ASSESSMENT AND PLAN:  Donald [name]. is a 58yo male with a PMH of PAD, h/o ETOH use disorder, COPD, HTN, BPH, h/o tobacco use disorder, h/o seizure, and protein-calorie malnutrition who presented with worsening AMS found to be septic.   1.Urosepsis -CXR shows hyperinflation and bronchitic changes, atelectasis vs infiltrate in medial left lung base- less likely source given UA findings -Blood cultures pending, NGx24hr - Urine culture pending, reincubated for better growth - MRSA PCR positive - continue abx coverage with Cefepime and Vanc - IVFs and PRN bolus to keep MAP >60 per sepsis protocol - palliative care consult for goals of care and multiple comorbidities   2.Altered mental status - CT head negative for acute intracranial abnormality - Monitor neurological status - patient may lack capacity for own medical decision making   3. PAD with ischemic feet BL s/p PCI 03/13/19 - Vascular surgery following- planning for revascularization with possible femoral endarterectomies with aortoiliac stenting 03/15/2019 - continue heparin gtt  4. ETOH abuse - Recent hospitalization for DTs - No active signs of withdrawal, CIWA monitoring  - Check  Daily BMP+Mg - Daily Thiamine, Folate, MVI  - Insomnia : Melatonin 3mg  - PT/OT evaluation for mobility  5. Sacral/ scrotal decubiti ulcers  - patient will need revascularization to improve healing - wound consult placed - place foley catheter to help keep area dry around ulcers  6. Hypokalemia  Hypomagnesemia  - K 98meq x2, then continue BID, recheck in am - Mg 2g x1, recheck in am  7.HTN- Goal BP <140/90 -On Clonidine, metoprolol and amlodipine - IV labetalol PRN  8. Severe, protein calorie malnutrition- likely 2/2 ETOH use disorder - nutrition consulted  All the records are reviewed and case discussed with Care Management/Social Worker.  Management plans discussed with the patient, family and they are in agreement.  CODE STATUS: Full Code  POSSIBLE D/C IN 2-3 DAYS, DEPENDING ON CLINICAL CONDITION.  Donald Rukia Mcgillivray, DO PGY-3, [name]  03/14/2019 at 8:06 AM  Between 7am to 6pm - Pager - [phone]  After 6pm go to [email or web address] - Proofreader  [facility]  Office  [phone]  CC: Primary care physician; Olin Hauser, DO  Note: This dictation was prepared with Dragon dictation along with smaller phrase technology. Any transcriptional errors that result from this process are unintentional.

## 2019-03-14 NOTE — NC FL2 (Signed)
[place] LEVEL OF CARE SCREENING TOOL     IDENTIFICATION  Patient Name: Donald [name]. Birthdate: Jan 17, 1961 Sex: male Admission Date (Current Location): 03/13/2019  [place] and [place] Number:  Selena Lesser [id number] T Facility and Address:  [facility], [address] [place], [place] [address]      Provider Number: [id number]  Attending Physician Name and Address:  Sela Hua, MD  Relative Name and Phone Number:       Current Level of Care: Hospital Recommended Level of Care: [place] Prior Approval Number:    Date Approved/Denied:   PASRR Number: [id number]  Discharge Plan: SNF    Current Diagnoses: Patient Active Problem List   Diagnosis Date Noted  . Sepsis due to urinary tract infection ([place]) 03/13/2019  . Protein-calorie malnutrition, severe 03/06/2019  . Pressure injury of skin 03/05/2019  . DTs (delirium tremens) ([place]) 03/04/2019  . Protein calorie malnutrition ([place]) 04/12/2018  . Insomnia 04/12/2018  . Low weight 07/06/2017  . Environmental and seasonal allergies 12/29/2016  . Special screening for malignant neoplasms, colon   . Benign neoplasm of cecum   . Benign neoplasm of transverse colon   . Benign neoplasm of sigmoid colon   . Alcohol dependence, in remission ([place]) 01/20/2016  . Mononeuropathy of both lower extremities 01/20/2016  . 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 Hyponatremia 11/14/2015  . Peripheral neuropathy 07/29/2015  . Essential hypertension 06/20/2015  . Benign prostatic hyperplasia 06/20/2015  . PAD (peripheral artery disease) ([place]) 06/20/2015  . Former smoker 06/20/2015  . Centrilobular emphysema ([place]) 06/20/2015    Orientation RESPIRATION BLADDER Height & Weight     Self  Normal Incontinent Weight: 108 lb 7.5 oz (49.2 kg) Height:  5\' 9"  (175.3 cm)  BEHAVIORAL SYMPTOMS/MOOD NEUROLOGICAL BOWEL NUTRITION STATUS      Continent Diet(see dc summary)  AMBULATORY STATUS  COMMUNICATION OF NEEDS Skin   Extensive Assist Verbally PU Stage and Appropriate Care   PU Stage 2 Dressing: (Coccyx Stage II care per dc summary)                   Personal Care Assistance Level of Assistance  Bathing, Feeding, Dressing Bathing Assistance: Limited assistance Feeding assistance: Independent Dressing Assistance: Limited assistance     Functional Limitations Info    Sight Info: Adequate Hearing Info: Adequate Speech Info: Adequate    SPECIAL CARE FACTORS FREQUENCY  PT (By licensed PT)     PT Frequency: 3-5 times week              Contractures Contractures Info: Not present    Additional Factors Info    Code Status Info: Full Allergies Info: NKA           Current Medications (03/14/2019):  This is the current hospital active medication list Current Facility-Administered Medications  Medication Dose Route Frequency Provider Last Rate Last Dose  . 0.9 %  sodium chloride infusion   Intravenous Continuous Algernon Huxley, MD 125 mL/hr at 03/14/19 0541    . amLODipine (NORVASC) tablet 10 mg  10 mg Oral Daily Algernon Huxley, MD   10 mg at 03/14/19 0942  . aspirin EC tablet 81 mg  81 mg Oral Daily Algernon Huxley, MD   81 mg at 03/14/19 6301  . ceFEPIme (MAXIPIME) 2 g in sodium chloride 0.9 % 100 mL IVPB  2 g Intravenous Q8H Algernon Huxley, MD 200 mL/hr at 03/14/19 0500 2 g at 03/14/19 0500  .  Chlorhexidine Gluconate Cloth 2 % PADS 6 each  6 each Topical Q0600 Algernon Huxley, MD   6 each at 03/14/19 0500  . cloNIDine (CATAPRES) tablet 0.1 mg  0.1 mg Oral BID Algernon Huxley, MD   0.1 mg at 03/14/19 0942  . feeding supplement (ENSURE ENLIVE) (ENSURE ENLIVE) liquid 237 mL  237 mL Oral BID BM Dew, Erskine Squibb, MD      . folic acid (FOLVITE) tablet 1 mg  1 mg Oral Daily Algernon Huxley, MD   1 mg at 03/14/19 0942  . heparin ADULT infusion 100 units/mL (25000 units/228mL sodium chloride 0.45%)  900 Units/hr Intravenous Continuous Shirley, [place], DO      . HYDROmorphone (DILAUDID)  injection 1 mg  1 mg Intravenous Once PRN Algernon Huxley, MD      . ipratropium-albuterol (DUONEB) 0.5-2.5 (3) MG/3ML nebulizer solution 3 mL  3 mL Nebulization BID Mayo, Pete Pelt, MD      . labetalol (NORMODYNE) injection 10 mg  10 mg Intravenous Q2H PRN Algernon Huxley, MD   10 mg at 03/13/19 1415  . LORazepam (ATIVAN) tablet 1 mg  1 mg Oral Q6H PRN Algernon Huxley, MD       Or  . LORazepam (ATIVAN) injection 1 mg  1 mg Intravenous Q6H PRN Algernon Huxley, MD   1 mg at 03/14/19 0937  . magnesium sulfate IVPB 2 g 50 mL  2 g Intravenous Once Mansy, Jan A, MD 50 mL/hr at 03/14/19 0936 2 g at 03/14/19 0936  . megestrol (MEGACE) tablet 80 mg  80 mg Oral Daily Mayo, Pete Pelt, MD   80 mg at 03/14/19 0941  . metoprolol succinate (TOPROL-XL) 24 hr tablet 50 mg  50 mg Oral Daily Algernon Huxley, MD   50 mg at 03/14/19 0942  . multivitamin with minerals tablet 1 tablet  1 tablet Oral Daily Algernon Huxley, MD   1 tablet at 03/14/19 0941  . mupirocin ointment (BACTROBAN) 2 % 1 application  1 application Nasal BID Algernon Huxley, MD   1 application at 45/80/99 [phone]  . ondansetron (ZOFRAN) injection 4 mg  4 mg Intravenous Q6H PRN Algernon Huxley, MD      . potassium chloride (KLOR-CON) packet 40 mEq  40 mEq Oral BID Lang Snow, NP   40 mEq at 03/13/19 2246  . sodium chloride flush (NS) 0.9 % injection 10-40 mL  10-40 mL Intracatheter PRN Algernon Huxley, MD      . tamsulosin (FLOMAX) capsule 0.4 mg  0.4 mg Oral QPC supper Algernon Huxley, MD      . thiamine (VITAMIN B-1) tablet 100 mg  100 mg Oral Daily Algernon Huxley, MD   100 mg at 03/14/19 0942  . vancomycin (VANCOCIN) IVPB 750 mg/150 ml premix  750 mg Intravenous Q12H Algernon Huxley, MD   Stopped at 03/14/19 0018     Discharge Medications: Please see discharge summary for a list of discharge medications.  Relevant Imaging Results:  Relevant Lab Results:   Additional Information SSN: [id number], [place]

## 2019-03-14 NOTE — Progress Notes (Signed)
[facility] Daily Progress Note   Subjective: 1 Day Post-Op: 1. Ultrasound guidance for vascular access bilateral femoral arteries 2. Catheter placement into the aorta from bilateral femoral approaches 3. Aortogram and selective bilateral lower extremity angiograms 4. Percutaneous transluminal angioplasty of the aorta with 6 mm diameter angioplasty balloons from both femoral sheaths 5.  Percutaneous transluminal angioplasty of both common iliac arteries with 6 mm diameter high-pressure angioplasty balloons and a kissing balloon fashion             6.  Percutaneous transluminal angioplasty of the right external iliac artery and proximal common femoral artery with 6 mm diameter by 8 cm length high-pressure angioplasty balloon 7.  Percutaneous transluminal angioplasty of the left external iliac artery and proximal common femoral artery with 6 mm diameter by 8 cm length high-pressure angioplasty balloon  Patient without complaint this AM. No issues overnight.   Objective: Vitals:   03/13/19 1938 03/14/19 0216 03/14/19 0355 03/14/19 0819  BP:   (!) 147/89 (!) 158/99  Pulse:   (!) 122 (!) 122  Resp:   16 18  Temp:   97.6 F (36.4 C) (!) 97.5 F (36.4 C)  TempSrc:   Oral Oral  SpO2: 96% 95% 95% 95%  Weight:      Height:        Intake/Output Summary (Last 24 hours) at 03/14/2019 1202 Last data filed at 03/14/2019 0500 Gross per 24 hour  Intake 1258.67 ml  Output 150 ml  Net 1108.67 ml   Physical Exam: A&Ox1, NAD CV: RRR Pulmonary: CTA Bilaterally Abdomen: Soft, Nontender, Nondistended Groins: Clean and dry. Vascular:  Right Lower Extremity: Thigh soft, calf soft.  Extremity is cool.  Motor/sensory minimally intact.  No ulcers noted at this time.  Left Lower Extremity: Thigh soft, calf soft.  Extremity is cool.  Motor/sensory minimally intact.  No ulcers noted at this time.   Discoloration to the toes to the left foot.   Laboratory: CBC    Component Value Date/Time   WBC 22.0 (H) 03/14/2019 0501   HGB 12.8 (L) 03/14/2019 0501   HGB 14.9 07/31/2015 1117   HCT 37.3 (L) 03/14/2019 0501   HCT 42.5 07/31/2015 1117   PLT 288 03/14/2019 0501   PLT 161 07/31/2015 1117   BMET    Component Value Date/Time   NA 145 03/14/2019 0501   NA 125 (L) 02/07/2016 1305   NA 130 (L) 11/20/2011 0510   K 2.8 (L) 03/14/2019 0501   K 4.0 11/20/2011 0510   CL 112 (H) 03/14/2019 0501   CL 98 11/20/2011 0510   CO2 22 03/14/2019 0501   CO2 18 (L) 11/20/2011 0510   GLUCOSE 110 (H) 03/14/2019 0501   GLUCOSE 82 11/20/2011 0510   BUN 14 03/14/2019 0501   BUN 5 (L) 02/07/2016 1305   BUN 1 (L) 11/20/2011 0510   CREATININE 0.81 03/14/2019 0501   CREATININE 0.93 01/05/2019 0800   CALCIUM 8.8 (L) 03/14/2019 0501   CALCIUM 8.5 11/20/2011 0510   GFRNONAA >60 03/14/2019 0501   GFRNONAA 92 01/05/2019 0800   GFRAA >60 03/14/2019 0501   GFRAA 107 01/05/2019 0800   Assessment/Planning: The patient is a 58 year old male with multiple medical issues including presenting to the emergency department with generalized weakness found to have urosepsis and severe peripheral artery disease during yesterday's angiogram  1) Aortogram: The celiac artery appeared to have reasonable flow.  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  The left renal artery either had a very early bifurcation or there were 2 left renal arteries. The right renal artery appeared to be patent.  The aorta occluded approximately 1 cm below the renal arteries that came off at approximately the same level. Both iliac arteries were occluded including the previously placed stents all the way down to the femoral arteries. Left lower Extremity: The common femoral artery appeared to be occluded with reconstitution of the profunda femoris artery and the SFA and the proximal segments.  The SFA was  very small and appeared to be diseased but the runoff was so sluggish contrast never got down to the distal SFA to evaluate further. 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  The SFA was small and appeared to have some disease but flow was extremely sluggish and distal runoff below the SFA really could not be evaluated.  2)  He is not a good candidate for an open abdominal operation like a typical aortobifem, and we will have to rebuild his aorta and iliac arteries with stents. The patient has been placed on the operating room schedule for tomorrow to undergo bilateral femoral endarterectomies with continued endovascular stent placement to the lower extremities.  I had a long conversation with the patient this morning about proceeding forward explaining the procedure, risks and benefits.  At this time, the patient is refusing.  We also had a long discussion in regard to not moving forward with any interventions.  He understands that this can lead to new/worsening ulcers, loss of tissue, need for amputation of the legs, sepsis and ultimately death.  The patient expresses understanding of the severity of his disease and his options.  I will move forward and make the patient NPO after midnight just in case he decides to change his mind and would proceed with surgery.  3) Consider palliative care/hospice consult  Discussed with Dr. Ellis Parents [facility] PA-C 03/14/2019 12:02 PM

## 2019-03-15 ENCOUNTER — Encounter: Payer: Self-pay | Admitting: Anesthesiology

## 2019-03-15 DIAGNOSIS — I700 Atherosclerosis of aorta: Secondary | ICD-10-CM

## 2019-03-15 DIAGNOSIS — Z7189 Other specified counseling: Secondary | ICD-10-CM

## 2019-03-15 DIAGNOSIS — Z515 Encounter for palliative care: Secondary | ICD-10-CM

## 2019-03-15 DIAGNOSIS — Z9889 Other specified postprocedural states: Secondary | ICD-10-CM

## 2019-03-15 DIAGNOSIS — R000 Tachycardia, unspecified: Secondary | ICD-10-CM

## 2019-03-15 LAB — URINE CULTURE: Culture: >=100000 — AB

## 2019-03-15 LAB — CBC
HCT: 38.2 % — ABNORMAL LOW (ref 39.0–52.0)
Hemoglobin: 13 g/dL (ref 13.0–17.0)
MCH: 34.5 pg — ABNORMAL HIGH (ref 26.0–34.0)
MCHC: 34 g/dL (ref 30.0–36.0)
MCV: 101.3 fL — ABNORMAL HIGH (ref 80.0–100.0)
Platelets: 277 10*3/uL (ref 150–400)
RBC: 3.77 MIL/uL — ABNORMAL LOW (ref 4.22–5.81)
RDW: 14.1 % (ref 11.5–15.5)
WBC: 26.4 10*3/uL — ABNORMAL HIGH (ref 4.0–10.5)
nRBC: 0 % (ref 0.0–0.2)

## 2019-03-15 LAB — APTT: aPTT: 68 seconds — ABNORMAL HIGH (ref 24–36)

## 2019-03-15 LAB — ABO/RH: ABO/RH(D): A POS

## 2019-03-15 LAB — PROTIME-INR
INR: 1.3 — ABNORMAL HIGH (ref 0.8–1.2)
Prothrombin Time: 16.2 seconds — ABNORMAL HIGH (ref 11.4–15.2)

## 2019-03-15 LAB — VITAMIN D 25 HYDROXY (VIT D DEFICIENCY, FRACTURES): Vit D, 25-Hydroxy: 24.2 ng/mL — ABNORMAL LOW (ref 30.0–100.0)

## 2019-03-15 LAB — HEPARIN LEVEL (UNFRACTIONATED)
Heparin Unfractionated: 0.37 IU/mL (ref 0.30–0.70)
Heparin Unfractionated: 0.38 IU/mL (ref 0.30–0.70)

## 2019-03-15 MED ORDER — CIPROFLOXACIN HCL 500 MG PO TABS
500.0000 mg | ORAL_TABLET | Freq: Two times a day (BID) | ORAL | Status: DC
  Administered 2019-03-15: 500 mg via ORAL
  Filled 2019-03-15 (×3): qty 1

## 2019-03-15 MED ORDER — HYDRALAZINE HCL 20 MG/ML IJ SOLN: 5.0000 mg | INTRAMUSCULAR | Status: DC | PRN

## 2019-03-15 MED ORDER — CEFAZOLIN SODIUM-DEXTROSE 2-4 GM/100ML-% IV SOLN
2.0000 g | INTRAVENOUS | Status: DC
  Filled 2019-03-15: qty 100

## 2019-03-15 MED ORDER — VITAMIN D 25 MCG (1000 UNIT) PO TABS
1000.0000 [IU] | ORAL_TABLET | Freq: Every day | ORAL | Status: DC
  Administered 2019-03-15 – 2019-03-23 (×7): 1000 [IU] via ORAL
  Filled 2019-03-15 (×7): qty 1

## 2019-03-15 MED ORDER — SODIUM CHLORIDE 0.9 % IV SOLN: INTRAVENOUS | Status: DC

## 2019-03-15 MED ORDER — POTASSIUM CHLORIDE 20 MEQ PO PACK
40.0000 meq | PACK | Freq: Once | ORAL | Status: AC
  Administered 2019-03-15: 40 meq via ORAL
  Filled 2019-03-15: qty 2

## 2019-03-15 MED ORDER — SODIUM CHLORIDE 0.9 % IV SOLN
INTRAVENOUS | Status: DC
  Administered 2019-03-16: 13:00:00 via INTRAVENOUS

## 2019-03-15 MED ORDER — METOPROLOL TARTRATE 5 MG/5ML IV SOLN
5.0000 mg | Freq: Four times a day (QID) | INTRAVENOUS | Status: DC | PRN
  Administered 2019-03-15 – 2019-03-21 (×12): 5 mg via INTRAVENOUS
  Filled 2019-03-15 (×12): qty 5

## 2019-03-15 MED ORDER — CEFAZOLIN SODIUM-DEXTROSE 2-4 GM/100ML-% IV SOLN
2.0000 g | INTRAVENOUS | Status: AC
  Administered 2019-03-16: 2 g via INTRAVENOUS
  Filled 2019-03-15: qty 100

## 2019-03-15 NOTE — TOC Progression Note (Addendum)
Transition of Care (TOC) - Progression Note    Patient Details  Name: Donald [name]. MRN: [id number] Date of Birth: 1960-09-15  Transition of Care [facility]) CM/SW Contact  Shade Flood, LCSW Phone Number: 03/15/2019, 9:22 AM  Clinical Narrative:     TOC following for anticipated SNF rehab needs. Multiple facilities have declined bed offer to pt. Multiple other facilities are still pending a decision.   Spoke with Marden Noble from [facility] who states that they are not currently making a bed offer but he stated that they are not denying him at this time and they will continue to assess and consider pt. Marden Noble asks that TOC keep in touch with him to update on pt's progress.  Marciano Sequin from [place] [phone]) did request additional clinical which was then sent to her through [facility]. Decision from [facility] still pending.  DC timeframe not yet known. Pt to have vascular surgical procedure today. TOC will follow.   Expected Discharge Plan: [place] Barriers to Discharge: Continued Medical Work up  Expected Discharge Plan and Services Expected Discharge Plan: [place] In-house Referral: Clinical Social Work   Post Acute Care Choice: [place] Living arrangements for the past 2 months: Single Family Home                                       Social Determinants of Health (SDOH) Interventions    Readmission Risk Interventions Readmission Risk Prevention Plan 03/14/2019  Transportation Screening Complete  Home Care Screening Not Complete  Home Care Screening Not Completed Comments Pt needs SNF placement  Medication Review (RN CM) Complete  Some recent data might be hidden

## 2019-03-15 NOTE — Consult Note (Signed)
Consultation Note Date: 03/15/2019   Patient Name: Donald [name].  DOB: 10-Jan-1961  MRN: [id number]  Age / Sex: 58 y.o., male  PCP: Olin Hauser, DO Referring Physician: Sela Hua, MD  Reason for Consultation: Establishing goals of care  HPI/Patient Profile: 58 y.o. male  with past medical history of depression, hypertension, peripheral vascular disease, vascular surgery history with 7 stents in the legs, former smoker, emphysema, low weight, alcohol abuse admitted on 03/13/2019 with sepsis UTI versus pneumonia, altered mental status, UTI, EtOH abuse.   Clinical Assessment and Goals of Care: Donald [name],  is lying quietly in bed.  He appears acutely/chronically ill and frail.  He will briefly open his eyes but not make eye contact.  There is no family at bedside at this time due to visitor restrictions.  Donald [name] schedule today for femoral endarterectomy/extensive aortic iliac stenting.    Call to mother, surrogate decision maker, [name] at [phone], stepfather is also on the line.  Blanch Media tells me that Mr. Roni Bread goes by Pilar Plate or [place].  We talked in detail about the vascular procedure scheduled for today.  Blanch Media shares that Donald [name] has had multiple stents in the past.  She talks about his smoking history "lungs like a 58 year old".  Family shares that they thought he had quit, but he continues to smoke.  We also talked about his history of substance abuse and alcohol dependence.  Blanch Media shares that Donald [name] was in hospital a week ago, and she didn't know, he at times is not open about his health concerns.  Blanch Media shares that he will frequently say that he is "fine".  Blanch Media shares that he had appointment with vein doctor last week for ongoing vascular issues.    We also talked in detail about UTI and the treatment plan.  We also review Cecille Rubin chronic health history.  Blanch Media tells me that  [name] is very independent.  She shares that there have been times she has tried to get him to the doctor, but he wouldn't go.    We talked about "what if's and maybe's".  We talked about CODE STATUS in detail.  Blanch Media and stepfather share that Donald [name] would not want to be on life support. They endorse DNR.  They go further to share that they do not believe he would want amputation, "would not want to be disabled".  [name] share that they have believed for a long time that Donald [name] would pass away before they.  Blanch Media states that recently [name] asked her, "take me home and let me shoot myself".  We talked about 24 to 48 hours for outcomes.  If what we are doing is not helping, Jahzier declines, then changing the focus to comfort and dignity, let nature take its course would be an acceptable choice.    PMT to continue to follow.  HCPOA      NEXT OF KIN - Mother [name].  Father died in 70.   Has adult children  but they are not close, he is not married. Blanch Media is close to daughter Despina Arias.      SUMMARY OF RECOMMENDATIONS   DNR status 24 to 48 hours for outcomes Mother and stepfather share that Donald [name] would not want amputation  Code Status/Advance Care Planning:  DNR  Symptom Management:   Per hospitalist, no additional needs at this time.  Palliative Prophylaxis:   Frequent Pain Assessment, Palliative Wound Care and Turn Reposition  Additional Recommendations (Limitations, Scope, Preferences):  Full Scope Treatment  Psycho-social/Spiritual:   Desire for further Chaplaincy support:no  Additional Recommendations: Caregiving  Support/Resources  Prognosis:   Unable to determine, based on outcomes.  Acutely/chronically ill and frail, but normal albumin  Discharge Planning: To be determined, based on outcomes.      Primary Diagnoses: Present on Admission: . Sepsis due to urinary tract infection ([place])   I have reviewed the medical record, interviewed the patient and  family, and examined the patient. The following aspects are pertinent.  Past Medical History:  Diagnosis Date  . Depression   . Emphysema of lung ([place])   . Essential hypertension   . History of chicken pox   . Low weight   . Neuropathy    bilateral lower legs  . Peripheral vascular disease ([place])   . Prostate disease   . Seasonal allergies   . Seizures ([place])   . Wears dentures    full upper   Social History   Socioeconomic History  . Marital status: Single    Spouse name: Not on file  . Number of children: Not on file  . Years of education: Not on file  . Highest education level: Not on file  Occupational History  . Not on file  Social Needs  . Financial resource strain: Not on file  . Food insecurity    Worry: Not on file    Inability: Not on file  . Transportation needs    Medical: Not on file    Non-medical: Not on file  Tobacco Use  . Smoking status: Former Smoker    Packs/day: 0.50    Years: 35.00    Pack years: 17.50    Types: Cigarettes    Quit date: 01/25/2018    Years since quitting: 1.1  . Smokeless tobacco: Former Systems developer    Quit date: 08/31/2016  . Tobacco comment: Previously quit in 09/2016 on Chantix until 09/2017  Substance and Sexual Activity  . Alcohol use: Not Currently    Alcohol/week: 0.0 standard drinks    Comment: occ  . Drug use: No  . Sexual activity: Not on file  Lifestyle  . Physical activity    Days per week: 0 days    Minutes per session: Not on file  . Stress: Not on file  Relationships  . Social Herbalist on phone: Not on file    Gets together: Not on file    Attends religious service: Not on file    Active member of club or organization: Not on file    Attends meetings of clubs or organizations: Not on file    Relationship status: Not on file  Other Topics Concern  . Not on file  Social History Narrative  . Not on file   Family History  Problem Relation Age of Onset  . Ulcers Father        bleeding  . Cancer  Mother        Breast   Scheduled Meds: . amLODipine  10 mg Oral Daily  . aspirin EC  81 mg Oral Daily  . Chlorhexidine Gluconate Cloth  6 each Topical Q0600  . cholecalciferol  1,000 Units Oral Daily  . cloNIDine  0.1 mg Oral BID  . feeding supplement (ENSURE ENLIVE)  237 mL Oral TID BM  . folic acid  1 mg Oral Daily  . Gerhardt's butt cream   Topical TID  . ipratropium-albuterol  3 mL Nebulization BID  . megestrol  200 mg Oral BID  . metoprolol succinate  50 mg Oral Daily  . multivitamin with minerals  1 tablet Oral Daily  . mupirocin ointment  1 application Nasal BID  . potassium chloride  40 mEq Oral BID  . tamsulosin  0.4 mg Oral QPC supper  . [START ON 03/20/2019] thiamine  100 mg Oral Daily  . vitamin C  250 mg Oral BID   Continuous Infusions: . sodium chloride 75 mL/hr at 03/15/19 0543  . sodium chloride    . [START ON 03/16/2019]  ceFAZolin (ANCEF) IV    . heparin 950 Units/hr (03/14/19 1611)  . [START ON 03/17/2019] thiamine injection    . thiamine injection Stopped (03/14/19 2305)  . vancomycin 1,000 mg (03/15/19 0813)   PRN Meds:.HYDROmorphone (DILAUDID) injection, labetalol, LORazepam **OR** LORazepam, ondansetron (ZOFRAN) IV, sodium chloride flush Medications Prior to Admission:  Prior to Admission medications   Medication Sig Start Date End Date Taking? Authorizing Provider  acetaminophen (TYLENOL) 500 MG tablet Take 500 mg by mouth every 6 (six) hours as needed.   Yes [provider]  albuterol (VENTOLIN HFA) 108 (90 Base) MCG/ACT inhaler Inhale 2 puffs into the lungs every 6 (six) hours as needed for wheezing or shortness of breath. 02/15/19  Yes Karamalegos, Devonne Doughty, DO  amLODipine (NORVASC) 10 MG tablet Take 1 tablet (10 mg total) by mouth daily. 03/09/19  Yes Ojie, Jude, MD  aspirin 81 MG tablet Take 1 tablet (81 mg total) by mouth daily. 07/29/15  Yes Krebs, Amy Lauren, NP  cloNIDine (CATAPRES) 0.1 MG tablet Take 1 tablet (0.1 mg total) by mouth 2  (two) times daily. 03/08/19  Yes Ojie, Jude, MD  feeding supplement, ENSURE ENLIVE, (ENSURE ENLIVE) LIQD Take 237 mLs by mouth 2 (two) times daily between meals. 03/08/19 04/07/19 Yes Ojie, Jude, MD  Fluticasone-Salmeterol (ADVAIR) 250-50 MCG/DOSE AEPB Inhale 1 puff into the lungs 2 (two) times daily. 07/06/17  Yes Karamalegos, Devonne Doughty, DO  megestrol (MEGACE) 40 MG tablet Take 2-4 tablets (80-160 mg total) by mouth daily. Start with 2 tablets a day, then gradually increase as tolerated up to 4 per day 01/12/19  Yes Karamalegos, Devonne Doughty, DO  metoprolol succinate (TOPROL-XL) 50 MG 24 hr tablet Take 1 tablet (50 mg total) by mouth daily. 12/23/18  Yes Karamalegos, Devonne Doughty, DO  tamsulosin (FLOMAX) 0.4 MG CAPS capsule Take 1 capsule (0.4 mg total) by mouth daily after supper. 01/12/19  Yes Karamalegos, Devonne Doughty, DO  chlordiazePOXIDE (LIBRIUM) 25 MG capsule 25 mg p.o. twice daily x2 days Then 25 mg p.o. daily x2 days Patient not taking: Reported on 03/09/2019 03/08/19   Otila Back, MD   No Known Allergies Review of Systems  Unable to perform ROS: Acuity of condition    Physical Exam Vitals signs and nursing note reviewed.  Constitutional:      Appearance: He is ill-appearing.     Comments: Unable to respond effectively, appears acutely/chronically ill  Cardiovascular:     Rate and Rhythm: Normal rate.  Pulmonary:     Effort: No respiratory distress.  Musculoskeletal:        General: No swelling.  Skin:    General: Skin is dry.     Comments: Right foot blue cold  Neurological:     Comments: Unable to respond to my orientation questions     Vital Signs: BP (!) 170/108 (BP Location: Left Arm)   Pulse (!) 134   Temp 99.9 F (37.7 C) (Oral)   Resp 19   Ht 5\' 9"  (1.753 m)   Wt 49.2 kg   SpO2 95%   BMI 16.02 kg/m  Pain Scale: PAINAD POSS *See Group Information*: 1-Acceptable,Awake and alert Pain Score: 0-No pain   SpO2: SpO2: 95 % O2 Device:SpO2: 95 % O2 Flow Rate: .O2 Flow  Rate (L/min): 3 L/min  IO: Intake/output summary:   Intake/Output Summary (Last 24 hours) at 03/15/2019 1256 Last data filed at 03/15/2019 9449 Gross per 24 hour  Intake 2673.94 ml  Output 1250 ml  Net 1423.94 ml    LBM: Last BM Date: (UTA) Baseline Weight: Weight: 49.2 kg Most recent weight: Weight: 49.2 kg     Palliative Assessment/Data:   Flowsheet Rows     Most Recent Value  Intake Tab  Referral Department  Hospitalist  Unit at Time of Referral  Cardiac/Telemetry Unit  Palliative Care Primary Diagnosis  Other (Comment) [GI/vascular]  Date Notified  03/14/19  Palliative Care Type  New Palliative care  Reason for referral  Clarify Goals of Care  Date of Admission  03/13/19  Date first seen by Palliative Care  03/15/19  # of days Palliative referral response time  1 Day(s)  # of days IP prior to Palliative referral  1  Clinical Assessment  Palliative Performance Scale Score  20%  Pain Max last 24 hours  Not able to report  Pain Min Last 24 hours  Not able to report  Dyspnea Max Last 24 Hours  Not able to report  Dyspnea Min Last 24 hours  Not able to report  Psychosocial & Spiritual Assessment  Palliative Care Outcomes      Time In: 1220 Time Out: 1350 Time Total: 90 minutes  Greater than 50%  of this time was spent counseling and coordinating care related to the above assessment and plan.  Signed by: Drue Novel, NP   Please contact Palliative Medicine Team phone at [phone] for questions and concerns.  For individual provider: See Shea Evans

## 2019-03-15 NOTE — Progress Notes (Signed)
Completed Q shift wound care to patient's sacrum. Tolerated fairly. Appreciate help of [place], [place]. Of note, bed won't return to the lowest position, even after unplugging and re-plugging. Will continue to monitor wound dressing status for remainder of shift. Will pass along in shift report. Wenda Low [facility]

## 2019-03-15 NOTE — Progress Notes (Signed)
[facility] [place] NAME: Donald [name]    MR#:  [id number]  DATE OF BIRTH:  May 29, 1961  SUBJECTIVE:  CHIEF COMPLAINT:   Chief Complaint  Patient presents with  . Altered Mental Status   Patient states that he would like to go home because he had a previous procedure done on his legs.  He says otherwise he is feeling fine.  Patient is confused and barely audible during our conversation. REVIEW OF SYSTEMS:  Review of Systems  Respiratory: Negative for shortness of breath.     DRUG ALLERGIES:  No Known Allergies VITALS:  Blood pressure (!) 144/77, pulse 95, temperature 99.9 F (37.7 C), temperature source Oral, resp. rate (!) 21, height 5\' 9"  (1.753 m), weight 49.2 kg, SpO2 95 %. PHYSICAL EXAMINATION:  Physical Exam Vitals signs reviewed.  Constitutional:      Comments: Frail, malnourished  HENT:     Head: Normocephalic and atraumatic.  Pulmonary:     Effort: Pulmonary effort is normal.  Skin:    Comments: Multiple nonhealing ulcers in groin Purple discoloration of BL feet  Neurological:     General: No focal deficit present.     Mental Status: He is alert.     Comments: A&O to self only  Psychiatric:     Comments: Confused    LABORATORY PANEL:  Male CBC Recent Labs  Lab 03/15/19 0111  WBC 26.4*  HGB 13.0  HCT 38.2*  PLT 277   ------------------------------------------------------------------------------------------------------------------ Chemistries  Recent Labs  Lab 03/13/19 0931  03/14/19 2334  NA 146*   < > 144  K 3.1*   < > 3.2*  CL 110   < > 115*  CO2 21*   < > 21*  GLUCOSE 140*   < > 130*  BUN 18   < > 15  CREATININE 0.72   < > 0.86  CALCIUM 10.5*   < > 8.9  MG  --    < > 2.0  AST 86*  --   --   ALT 29  --   --   ALKPHOS 87  --   --   BILITOT 1.8*  --   --    < > = values in this interval not displayed.   RADIOLOGY:  No results found. ASSESSMENT AND PLAN:  Donald Rama. is a 58yo male  with a PMH of PAD, h/o ETOH use disorder, COPD, HTN, BPH, h/o tobacco use disorder, h/o seizure, and protein-calorie malnutrition who presented with worsening AMS found to be septic.   1.Urosepsis -Blood cultures pending, NGx2d - Urine culture growing staph epidermidis which is sensitive to all but tetracycline-patient on vancomycin, may consider narrowing to Cipro however patient's WBCs continuing to increase, 26.4> may be 2/2 PAD as below - MRSA PCR positive - Discontinued cefepime this a.m. - IVFs and PRN bolus to keep MAP >60 per sepsis protocol - palliative care consulted for goals of care and multiple comorbidities   2.Altered mental status - Monitor neurological status - patient may lack capacity for own medical decision making   3. PAD with ischemic feet BL s/p PCI 03/13/19, for revascularization on 03/15/2019 - Vascular surgery following- patient has been taken for surgery for  revascularization with possiblefemoral endarterectomy/extensive aortoiliac stenting - continue heparin gtt per vascular sx  4. ETOH abuse - Recent hospitalization for DTs -No active signs of withdrawal, CIWA monitoring  - Check Daily BMP+Mg - Daily Folate, MVI; continue high-dose  IV thiamine x3 days - PT/OT evaluation for mobility  5. Sacral/ scrotal decubiti ulcers  - patient will need revascularization to improve healing, see above PAD - wound care consulted - Foley catheter in place to help keep the area dry  6. Hypokalemia  Hypomagnesemia  - Monitor K and mag, replete as indicated  7.HTN- Goal BP <140/90 -OnClonidine, metoprolol and amlodipine - IV labetalol PRN  8. Severe, protein calorie malnutrition- likely 2/2 ETOH use disorder - nutrition following  All the records are reviewed and case discussed with Care Management/Social Worker. Management plans discussed with the patient, family and they are in agreement.  CODE STATUS: Full Code  POSSIBLE D/C IN 1-2 DAYS,  DEPENDING ON CLINICAL CONDITION.  Donald Joe Tanney, DO PGY-3, [name]  03/15/2019 at 8:00 AM  Between 7am to 6pm - Pager - [phone]  After 6pm go to [email or web address] - Proofreader  [facility]  Office  [phone]  CC: Primary care physician; Olin Hauser, DO

## 2019-03-15 NOTE — Progress Notes (Signed)
Gardiner Barefoot NP was notified of patient's sustained HR in the 140s. 5mg  of metop was given per order. BMP and MG was drawn per order, 40K of potassium administered per order. EKG completed and filed. Patient now resting comfortably.

## 2019-03-15 NOTE — Consult Note (Signed)
ANTICOAGULATION CONSULT NOTE - Initial Consult  Pharmacy Consult for Heparin Indication: aortic occlusion  No Known Allergies  Patient Measurements: Height: 5\' 9"  (175.3 cm) Weight: 108 lb 7.5 oz (49.2 kg) IBW/kg (Calculated) : 70.7 Heparin Dosing Weight: 49.2  Vital Signs: BP: 159/98 (07/07 2346) Pulse Rate: 112 (07/07 2346)  Labs: Recent Labs    03/13/19 0931 03/13/19 0956  03/14/19 0501  03/14/19 1229 03/14/19 1857 03/14/19 2334 03/15/19 0111  HGB 16.7  --   --  12.8*  --   --   --   --  13.0  HCT 47.9  --   --  37.3*  --   --   --   --  38.2*  PLT 478*  --   --  288  --   --   --   --  277  APTT  --  31  --   --   --   --   --   --  68*  LABPROT  --  14.0  --   --   --   --   --   --  16.2*  INR  --  1.1  --   --   --   --   --   --  1.3*  HEPARINUNFRC  --   --    < >  --    < > 0.26* 0.53  --  0.37  CREATININE 0.72  --   --  0.81  --   --   --  0.86  --    < > = values in this interval not displayed.    Estimated Creatinine Clearance: 67.5 mL/min (by C-G formula based on SCr of 0.86 mg/dL).   Medical History: Past Medical History:  Diagnosis Date  . Depression   . Emphysema of lung ([place])   . Essential hypertension   . History of chicken pox   . Low weight   . Neuropathy    bilateral lower legs  . Peripheral vascular disease ([place])   . Prostate disease   . Seasonal allergies   . Seizures ([place])   . Wears dentures    full upper    Medications:  Medications Prior to Admission  Medication Sig Dispense Refill Last Dose  . acetaminophen (TYLENOL) 500 MG tablet Take 500 mg by mouth every 6 (six) hours as needed.   03/11/2019  . albuterol (VENTOLIN HFA) 108 (90 Base) MCG/ACT inhaler Inhale 2 puffs into the lungs every 6 (six) hours as needed for wheezing or shortness of breath. 8.5 g 2 Unknown at PRN  . amLODipine (NORVASC) 10 MG tablet Take 1 tablet (10 mg total) by mouth daily. 30 tablet 0 03/12/2019 at 1000  . aspirin 81 MG tablet Take 1 tablet (81 mg total)  by mouth daily. 30 tablet 11 03/08/2019 at Unknown time  . cloNIDine (CATAPRES) 0.1 MG tablet Take 1 tablet (0.1 mg total) by mouth 2 (two) times daily. 60 tablet 0 03/08/2019 at Unknown time  . feeding supplement, ENSURE ENLIVE, (ENSURE ENLIVE) LIQD Take 237 mLs by mouth 2 (two) times daily between meals. 14220 mL 0 03/11/2019  . Fluticasone-Salmeterol (ADVAIR) 250-50 MCG/DOSE AEPB Inhale 1 puff into the lungs 2 (two) times daily. 60 each 11 03/08/2019 at Unknown time  . megestrol (MEGACE) 40 MG tablet Take 2-4 tablets (80-160 mg total) by mouth daily. Start with 2 tablets a day, then gradually increase as tolerated up to 4 per day 120 tablet 0 Past  Week at Unknown time  . metoprolol succinate (TOPROL-XL) 50 MG 24 hr tablet Take 1 tablet (50 mg total) by mouth daily. 90 tablet 1 03/08/2019 at Unknown time  . tamsulosin (FLOMAX) 0.4 MG CAPS capsule Take 1 capsule (0.4 mg total) by mouth daily after supper. 90 capsule 3 03/08/2019 at Unknown time  . chlordiazePOXIDE (LIBRIUM) 25 MG capsule 25 mg p.o. twice daily x2 days Then 25 mg p.o. daily x2 days (Patient not taking: Reported on 03/09/2019) 6 capsule 0 03/07/2019 at Unknown time   Scheduled:  . amLODipine  10 mg Oral Daily  . aspirin EC  81 mg Oral Daily  . Chlorhexidine Gluconate Cloth  6 each Topical Q0600  . cloNIDine  0.1 mg Oral BID  . feeding supplement (ENSURE ENLIVE)  237 mL Oral TID BM  . folic acid  1 mg Oral Daily  . Gerhardt's butt cream   Topical TID  . ipratropium-albuterol  3 mL Nebulization BID  . megestrol  200 mg Oral BID  . metoprolol succinate  50 mg Oral Daily  . multivitamin with minerals  1 tablet Oral Daily  . mupirocin ointment  1 application Nasal BID  . potassium chloride  40 mEq Oral BID  . tamsulosin  0.4 mg Oral QPC supper  . [START ON 03/20/2019] thiamine  100 mg Oral Daily  . vitamin C  250 mg Oral BID   Infusions:  . sodium chloride Stopped (03/15/19 0031)  . sodium chloride 75 mL/hr at 03/15/19 0032  . ceFEPime  (MAXIPIME) IV Stopped (03/14/19 2218)  . heparin 950 Units/hr (03/14/19 1611)  . [START ON 03/17/2019] thiamine injection    . thiamine injection Stopped (03/14/19 2305)  . vancomycin     PRN: HYDROmorphone (DILAUDID) injection, labetalol, LORazepam **OR** LORazepam, ondansetron (ZOFRAN) IV, sodium chloride flush  Assessment: Pharmacy consulted for heparin. No DOAC PTA.   7/6 Heparin infusion started @ 750 units/hr  7/6 @ 2315 HL: 0.14. Level subtherapeutic.  Infusion increased to 900 units/hr.  7/7 @ 0503 HL: 0.47. Level is therapeutic x 1- heparin infusion rate of  900 units/hr. 7/7 @ 1229 HL: 0.26. Level is subtherapeutic. 700 unit bolus and rate increased to 950 units/hr.  7/7 @ 1857 HL: 0.53. Level is therapeutic.  7/8 @ 0111 HL: 0.37, aPTT: 68  Goal of Therapy:  Heparin level 0.3-0.7 units/ml Monitor platelets by anticoagulation protocol: Yes   Plan:  0708 @ 0111 HL: 0.37, aPTT: 68. Both levels are therapeutic. Since levels are at the low end of normal- will recheck confirmatory level in 6 hours.  Continue current heparin infusion rate at 950 units/hr Continue to check anti-Xa levels daily while on heparin Continue to monitor H&H and platelets  Pernell Dupre, PharmD, BCPS Clinical Pharmacist 03/15/2019 2:10 AM

## 2019-03-15 NOTE — Progress Notes (Signed)
[facility]  Daily Progress Note   Subjective  - 2 Days Post-Op  Patient confused, unable to provide history.  In mittens for agitation.    Objective Vitals:   03/14/19 2330 03/14/19 2346 03/15/19 0426 03/15/19 0446  BP: 104/69 (!) 159/98  (!) 144/77  Pulse: (!) 114 (!) 112  95  Resp:    (!) 21  Temp:   99.9 F (37.7 C)   TempSrc:   Oral   SpO2:    95%  Weight:      Height:        Intake/Output Summary (Last 24 hours) at 03/15/2019 0852 Last data filed at 03/15/2019 8502 Gross per 24 hour  Intake 2673.94 ml  Output 1250 ml  Net 1423.94 ml    PULM  CTAB CV  RRR VASC  No pedal pulses, feet are ischemic  Laboratory CBC    Component Value Date/Time   WBC 26.4 (H) 03/15/2019 0111   HGB 13.0 03/15/2019 0111   HGB 14.9 07/31/2015 1117   HCT 38.2 (L) 03/15/2019 0111   HCT 42.5 07/31/2015 1117   PLT 277 03/15/2019 0111   PLT 161 07/31/2015 1117    BMET    Component Value Date/Time   NA 144 03/14/2019 2334   NA 125 (L) 02/07/2016 1305   NA 130 (L) 11/20/2011 0510   K 3.2 (L) 03/14/2019 2334   K 4.0 11/20/2011 0510   CL 115 (H) 03/14/2019 2334   CL 98 11/20/2011 0510   CO2 21 (L) 03/14/2019 2334   CO2 18 (L) 11/20/2011 0510   GLUCOSE 130 (H) 03/14/2019 2334   GLUCOSE 82 11/20/2011 0510   BUN 15 03/14/2019 2334   BUN 5 (L) 02/07/2016 1305   BUN 1 (L) 11/20/2011 0510   CREATININE 0.86 03/14/2019 2334   CREATININE 0.93 01/05/2019 0800   CALCIUM 8.9 03/14/2019 2334   CALCIUM 8.5 11/20/2011 0510   GFRNONAA >60 03/14/2019 2334   GFRNONAA 92 01/05/2019 0800   GFRAA >60 03/14/2019 2334   GFRAA 107 01/05/2019 0800    Assessment/Planning: POD #2 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    Mother and stepfather understanding of the situation and desire to proceed with surgery in hopes of improving perfusion and restoring  function  Discussed that even with surgery, will be a long road to recovery but with perfusion wounds may heal and leg function would be expected to improve  High risk patient but urgent nature of the situation requires this be done sooner rather than later  Will plan OR later today for femoral endarterectomy/extensive aortoiliac stenting    Leotis Pain  03/15/2019, 8:52 AM

## 2019-03-15 NOTE — Consult Note (Signed)
ANTICOAGULATION CONSULT NOTE - Initial Consult  Pharmacy Consult for Heparin Indication: aortic occlusion  No Known Allergies  Patient Measurements: Height: 5\' 9"  (175.3 cm) Weight: 108 lb 7.5 oz (49.2 kg) IBW/kg (Calculated) : 70.7 Heparin Dosing Weight: 49.2  Vital Signs: Temp: 99.9 F (37.7 C) (07/08 0426) Temp Source: Oral (07/08 0426) BP: 144/77 (07/08 0446) Pulse Rate: 95 (07/08 0446)  Labs: Recent Labs    03/13/19 0931 03/13/19 0956  03/14/19 0501  03/14/19 1229 03/14/19 1857 03/14/19 2334 03/15/19 0111  HGB 16.7  --   --  12.8*  --   --   --   --  13.0  HCT 47.9  --   --  37.3*  --   --   --   --  38.2*  PLT 478*  --   --  288  --   --   --   --  277  APTT  --  31  --   --   --   --   --   --  68*  LABPROT  --  14.0  --   --   --   --   --   --  16.2*  INR  --  1.1  --   --   --   --   --   --  1.3*  HEPARINUNFRC  --   --    < >  --    < > 0.26* 0.53  --  0.37  CREATININE 0.72  --   --  0.81  --   --   --  0.86  --    < > = values in this interval not displayed.    Estimated Creatinine Clearance: 67.5 mL/min (by C-G formula based on SCr of 0.86 mg/dL).   Medical History: Past Medical History:  Diagnosis Date  . Depression   . Emphysema of lung ([place])   . Essential hypertension   . History of chicken pox   . Low weight   . Neuropathy    bilateral lower legs  . Peripheral vascular disease ([place])   . Prostate disease   . Seasonal allergies   . Seizures ([place])   . Wears dentures    full upper    Medications:  Medications Prior to Admission  Medication Sig Dispense Refill Last Dose  . acetaminophen (TYLENOL) 500 MG tablet Take 500 mg by mouth every 6 (six) hours as needed.   03/11/2019  . albuterol (VENTOLIN HFA) 108 (90 Base) MCG/ACT inhaler Inhale 2 puffs into the lungs every 6 (six) hours as needed for wheezing or shortness of breath. 8.5 g 2 Unknown at PRN  . amLODipine (NORVASC) 10 MG tablet Take 1 tablet (10 mg total) by mouth daily. 30 tablet 0  03/12/2019 at 1000  . aspirin 81 MG tablet Take 1 tablet (81 mg total) by mouth daily. 30 tablet 11 03/08/2019 at Unknown time  . cloNIDine (CATAPRES) 0.1 MG tablet Take 1 tablet (0.1 mg total) by mouth 2 (two) times daily. 60 tablet 0 03/08/2019 at Unknown time  . feeding supplement, ENSURE ENLIVE, (ENSURE ENLIVE) LIQD Take 237 mLs by mouth 2 (two) times daily between meals. 14220 mL 0 03/11/2019  . Fluticasone-Salmeterol (ADVAIR) 250-50 MCG/DOSE AEPB Inhale 1 puff into the lungs 2 (two) times daily. 60 each 11 03/08/2019 at Unknown time  . megestrol (MEGACE) 40 MG tablet Take 2-4 tablets (80-160 mg total) by mouth daily. Start with 2 tablets a day, then gradually  increase as tolerated up to 4 per day 120 tablet 0 Past Week at Unknown time  . metoprolol succinate (TOPROL-XL) 50 MG 24 hr tablet Take 1 tablet (50 mg total) by mouth daily. 90 tablet 1 03/08/2019 at Unknown time  . tamsulosin (FLOMAX) 0.4 MG CAPS capsule Take 1 capsule (0.4 mg total) by mouth daily after supper. 90 capsule 3 03/08/2019 at Unknown time  . chlordiazePOXIDE (LIBRIUM) 25 MG capsule 25 mg p.o. twice daily x2 days Then 25 mg p.o. daily x2 days (Patient not taking: Reported on 03/09/2019) 6 capsule 0 03/07/2019 at Unknown time   Scheduled:  . amLODipine  10 mg Oral Daily  . aspirin EC  81 mg Oral Daily  . Chlorhexidine Gluconate Cloth  6 each Topical Q0600  . cloNIDine  0.1 mg Oral BID  . feeding supplement (ENSURE ENLIVE)  237 mL Oral TID BM  . folic acid  1 mg Oral Daily  . Gerhardt's butt cream   Topical TID  . ipratropium-albuterol  3 mL Nebulization BID  . megestrol  200 mg Oral BID  . metoprolol succinate  50 mg Oral Daily  . multivitamin with minerals  1 tablet Oral Daily  . mupirocin ointment  1 application Nasal BID  . potassium chloride  40 mEq Oral BID  . tamsulosin  0.4 mg Oral QPC supper  . [START ON 03/20/2019] thiamine  100 mg Oral Daily  . vitamin C  250 mg Oral BID   Infusions:  . sodium chloride Stopped (03/15/19  0031)  . sodium chloride 75 mL/hr at 03/15/19 0543  . heparin 950 Units/hr (03/14/19 1611)  . [START ON 03/17/2019] thiamine injection    . thiamine injection Stopped (03/14/19 2305)  . vancomycin 1,000 mg (03/15/19 0813)   PRN: HYDROmorphone (DILAUDID) injection, labetalol, LORazepam **OR** LORazepam, ondansetron (ZOFRAN) IV, sodium chloride flush  Assessment: Pharmacy consulted for heparin. No DOAC PTA.   7/6 Heparin infusion started @ 750 units/hr  7/6 @ 2315 HL: 0.14. Level subtherapeutic.  Infusion increased to 900 units/hr.  7/7 @ 0503 HL: 0.47. Level is therapeutic x 1- heparin infusion rate of  900 units/hr. 7/7 @ 1229 HL: 0.26. Level is subtherapeutic. 700 unit bolus and rate increased to 950 units/hr.  7/7 @ 1857 HL: 0.53. Level is therapeutic.  7/8 @ 0111 HL: 0.37, aPTT: 68, Both levels are therapeutic. 7/8 @ 0756 HL: 0.38.  Level is therapeutic x 2.    Goal of Therapy:  Heparin level 0.3-0.7 units/ml Monitor platelets by anticoagulation protocol: Yes   Plan:  Continue current heparin infusion rate at 950 units/hr Continue to check anti-Xa levels & CBC daily while on heparin Continue to monitor H&H and platelets  Rowland Lathe, PharmD Clinical Pharmacist 03/15/2019 8:22 AM

## 2019-03-15 NOTE — Plan of Care (Signed)
  Problem: Education: Goal: Knowledge of General Education information will improve Description: Including pain rating scale, medication(s)/side effects and non-pharmacologic comfort measures Outcome: Not Progressing Note: Patient too confused and delusional today to achieve this goal. Unsure of baseline. Will continue to monitor neurological status. Wenda Low [facility]

## 2019-03-15 NOTE — Anesthesia Preprocedure Evaluation (Addendum)
Anesthesia Evaluation  Patient identified by MRN, date of birth, ID band Patient awake    Reviewed: Allergy & Precautions, NPO status , Patient's Chart, lab work & pertinent test results, reviewed documented beta blocker date and time   Airway Mallampati: II  TM Distance: >3 FB     Dental  (+) Edentulous Upper, Poor Dentition   Pulmonary COPD, Current Smoker, former smoker,    breath sounds clear to auscultation       Cardiovascular hypertension, Pt. on medications and Pt. on home beta blockers + Peripheral Vascular Disease   Rhythm:Regular Rate:Normal     Neuro/Psych Seizures -,  PSYCHIATRIC DISORDERS Depression Hx alcohol dependence, in remissionSensory ataxia  Neuromuscular disease (lower extremity mononeuropathy)    GI/Hepatic negative GI ROS, (+)     substance abuse  alcohol use,   Endo/Other  negative endocrine ROSUnderweight - protein calorie malnutrition  Renal/GU negative Renal ROS     Musculoskeletal negative musculoskeletal ROS (+)   Abdominal   Peds negative pediatric ROS (+)  Hematology   Anesthesia Other Findings Mental fog - unable to answer questions approp. DNR. K 3.6. Hb 12.2. EKG ok.  Reproductive/Obstetrics                            Anesthesia Physical  Anesthesia Plan  ASA: IV  Anesthesia Plan: General   Post-op Pain Management:    Induction: Intravenous  PONV Risk Score and Plan:   Airway Management Planned: Oral ETT  Additional Equipment:   Intra-op Plan:   Post-operative Plan: Possible Post-op intubation/ventilation and Extubation in OR  Informed Consent: I have reviewed the patients History and Physical, chart, labs and discussed the procedure including the risks, benefits and alternatives for the proposed anesthesia with the patient or authorized representative who has indicated his/her understanding and acceptance.       Plan Discussed with:  CRNA  Anesthesia Plan Comments:        Anesthesia Quick Evaluation

## 2019-03-15 NOTE — Progress Notes (Signed)
Patient was kept NPO overnight. Patient was assisted with bed mobility, repositioned Q2 and as needed.

## 2019-03-15 NOTE — Plan of Care (Signed)

## 2019-03-16 ENCOUNTER — Encounter
Admission: EM | Disposition: A | Discharge status: Skilled Nursing Facility | Payer: Self-pay | Source: Home / Self Care | Attending: Internal Medicine

## 2019-03-16 ENCOUNTER — Other Ambulatory Visit: Payer: Self-pay | Admitting: Vascular Surgery

## 2019-03-16 ENCOUNTER — Inpatient Hospital Stay: Payer: Medicaid Other | Admitting: Anesthesiology

## 2019-03-16 ENCOUNTER — Inpatient Hospital Stay: Payer: Medicaid Other

## 2019-03-16 ENCOUNTER — Encounter: Payer: Self-pay | Admitting: Anesthesiology

## 2019-03-16 ENCOUNTER — Ambulatory Visit: Payer: Medicaid Other | Admitting: Licensed Clinical Social Worker

## 2019-03-16 DIAGNOSIS — G629 Polyneuropathy, unspecified: Secondary | ICD-10-CM

## 2019-03-16 DIAGNOSIS — Z79899 Other long term (current) drug therapy: Secondary | ICD-10-CM

## 2019-03-16 DIAGNOSIS — R825 Elevated urine levels of drugs, medicaments and biological substances: Secondary | ICD-10-CM

## 2019-03-16 DIAGNOSIS — Z86718 Personal history of other venous thrombosis and embolism: Secondary | ICD-10-CM

## 2019-03-16 DIAGNOSIS — L304 Erythema intertrigo: Secondary | ICD-10-CM

## 2019-03-16 DIAGNOSIS — E46 Unspecified protein-calorie malnutrition: Secondary | ICD-10-CM

## 2019-03-16 DIAGNOSIS — N4889 Other specified disorders of penis: Secondary | ICD-10-CM

## 2019-03-16 DIAGNOSIS — E876 Hypokalemia: Secondary | ICD-10-CM

## 2019-03-16 DIAGNOSIS — J449 Chronic obstructive pulmonary disease, unspecified: Secondary | ICD-10-CM

## 2019-03-16 DIAGNOSIS — L97929 Non-pressure chronic ulcer of unspecified part of left lower leg with unspecified severity: Secondary | ICD-10-CM

## 2019-03-16 DIAGNOSIS — I70239 Atherosclerosis of native arteries of right leg with ulceration of unspecified site: Secondary | ICD-10-CM

## 2019-03-16 DIAGNOSIS — I10 Essential (primary) hypertension: Secondary | ICD-10-CM

## 2019-03-16 DIAGNOSIS — L89152 Pressure ulcer of sacral region, stage 2: Secondary | ICD-10-CM

## 2019-03-16 DIAGNOSIS — I998 Other disorder of circulatory system: Secondary | ICD-10-CM

## 2019-03-16 DIAGNOSIS — L97919 Non-pressure chronic ulcer of unspecified part of right lower leg with unspecified severity: Secondary | ICD-10-CM

## 2019-03-16 DIAGNOSIS — Z95820 Peripheral vascular angioplasty status with implants and grafts: Secondary | ICD-10-CM

## 2019-03-16 DIAGNOSIS — I700 Atherosclerosis of aorta: Secondary | ICD-10-CM

## 2019-03-16 DIAGNOSIS — F1010 Alcohol abuse, uncomplicated: Secondary | ICD-10-CM

## 2019-03-16 DIAGNOSIS — N400 Enlarged prostate without lower urinary tract symptoms: Secondary | ICD-10-CM

## 2019-03-16 DIAGNOSIS — I739 Peripheral vascular disease, unspecified: Secondary | ICD-10-CM

## 2019-03-16 DIAGNOSIS — D72829 Elevated white blood cell count, unspecified: Secondary | ICD-10-CM

## 2019-03-16 DIAGNOSIS — G40909 Epilepsy, unspecified, not intractable, without status epilepticus: Secondary | ICD-10-CM

## 2019-03-16 DIAGNOSIS — R627 Adult failure to thrive: Secondary | ICD-10-CM

## 2019-03-16 DIAGNOSIS — R410 Disorientation, unspecified: Secondary | ICD-10-CM

## 2019-03-16 DIAGNOSIS — Z978 Presence of other specified devices: Secondary | ICD-10-CM

## 2019-03-16 DIAGNOSIS — I70249 Atherosclerosis of native arteries of left leg with ulceration of unspecified site: Secondary | ICD-10-CM

## 2019-03-16 HISTORY — PX: ENDARTERECTOMY FEMORAL: SHX5804

## 2019-03-16 HISTORY — PX: ABDOMINAL AORTIC ENDOVASCULAR STENT GRAFT: SHX5707

## 2019-03-16 LAB — HEPARIN LEVEL (UNFRACTIONATED): Heparin Unfractionated: 0.4 IU/mL (ref 0.30–0.70)

## 2019-03-16 LAB — BASIC METABOLIC PANEL
Anion gap: 10 (ref 5–15)
BUN: 17 mg/dL (ref 6–20)
CO2: 21 mmol/L — ABNORMAL LOW (ref 22–32)
Calcium: 9.3 mg/dL (ref 8.9–10.3)
Chloride: 118 mmol/L — ABNORMAL HIGH (ref 98–111)
Creatinine, Ser: 0.83 mg/dL (ref 0.61–1.24)
GFR calc Af Amer: >60 mL/min (ref >60)
GFR calc non Af Amer: >60 mL/min (ref >60)
Glucose, Bld: 94 mg/dL (ref 70–99)
Potassium: 3.6 mmol/L (ref 3.5–5.1)
Sodium: 149 mmol/L — ABNORMAL HIGH (ref 135–145)

## 2019-03-16 LAB — CBC
HCT: 37.5 % — ABNORMAL LOW (ref 39.0–52.0)
Hemoglobin: 12.2 g/dL — ABNORMAL LOW (ref 13.0–17.0)
MCH: 34.4 pg — ABNORMAL HIGH (ref 26.0–34.0)
MCHC: 32.5 g/dL (ref 30.0–36.0)
MCV: 105.6 fL — ABNORMAL HIGH (ref 80.0–100.0)
Platelets: 255 10*3/uL (ref 150–400)
RBC: 3.55 MIL/uL — ABNORMAL LOW (ref 4.22–5.81)
RDW: 14.2 % (ref 11.5–15.5)
WBC: 26.2 10*3/uL — ABNORMAL HIGH (ref 4.0–10.5)
nRBC: 0 % (ref 0.0–0.2)

## 2019-03-16 LAB — PROCALCITONIN: Procalcitonin: 0.53 ng/mL

## 2019-03-16 LAB — PROTIME-INR
INR: 1.3 — ABNORMAL HIGH (ref 0.8–1.2)
Prothrombin Time: 15.9 seconds — ABNORMAL HIGH (ref 11.4–15.2)

## 2019-03-16 LAB — APTT: aPTT: 75 seconds — ABNORMAL HIGH (ref 24–36)

## 2019-03-16 LAB — MAGNESIUM: Magnesium: 2 mg/dL (ref 1.7–2.4)

## 2019-03-16 SURGERY — ENDARTERECTOMY, FEMORAL
Anesthesia: General | Laterality: Bilateral

## 2019-03-16 MED ORDER — FENTANYL CITRATE (PF) 100 MCG/2ML IJ SOLN
INTRAMUSCULAR | Status: DC | PRN
  Administered 2019-03-16: 25 ug via INTRAVENOUS
  Administered 2019-03-16: 50 ug via INTRAVENOUS
  Administered 2019-03-16: 25 ug via INTRAVENOUS

## 2019-03-16 MED ORDER — BUPIVACAINE-EPINEPHRINE (PF) 0.25% -1:200000 IJ SOLN
INTRAMUSCULAR | Status: AC
  Filled 2019-03-16: qty 30

## 2019-03-16 MED ORDER — PROPOFOL 10 MG/ML IV BOLUS
INTRAVENOUS | Status: DC | PRN
  Administered 2019-03-16: 80 mg via INTRAVENOUS

## 2019-03-16 MED ORDER — ONDANSETRON HCL 4 MG/2ML IJ SOLN
INTRAMUSCULAR | Status: DC | PRN
  Administered 2019-03-16 (×2): 4 mg via INTRAVENOUS

## 2019-03-16 MED ORDER — VANCOMYCIN HCL IN DEXTROSE 1-5 GM/200ML-% IV SOLN
1000.0000 mg | Freq: Once | INTRAVENOUS | Status: AC
  Administered 2019-03-16: 1000 mg via INTRAVENOUS
  Filled 2019-03-16: qty 200

## 2019-03-16 MED ORDER — HEPARIN (PORCINE) 25000 UT/250ML-% IV SOLN
1050.0000 [IU]/h | INTRAVENOUS | Status: DC
  Administered 2019-03-17: 950 [IU]/h via INTRAVENOUS
  Administered 2019-03-18 – 2019-03-20 (×3): 1050 [IU]/h via INTRAVENOUS
  Filled 2019-03-16 (×4): qty 250

## 2019-03-16 MED ORDER — SODIUM CHLORIDE 0.9 % IV SOLN
INTRAVENOUS | Status: DC | PRN
  Administered 2019-03-16: 13:00:00 30 ug/min via INTRAVENOUS

## 2019-03-16 MED ORDER — MAGNESIUM SULFATE 2 GM/50ML IV SOLN
2.0000 g | Freq: Once | INTRAVENOUS | Status: AC
  Administered 2019-03-16: 2 g via INTRAVENOUS
  Filled 2019-03-16: qty 50

## 2019-03-16 MED ORDER — ESMOLOL HCL 100 MG/10ML IV SOLN
INTRAVENOUS | Status: DC | PRN
  Administered 2019-03-16: 10 mg via INTRAVENOUS

## 2019-03-16 MED ORDER — EVICEL 5 ML EX KIT
PACK | CUTANEOUS | Status: DC | PRN
  Administered 2019-03-16: 1

## 2019-03-16 MED ORDER — DEXAMETHASONE SODIUM PHOSPHATE 10 MG/ML IJ SOLN
INTRAMUSCULAR | Status: DC | PRN
  Administered 2019-03-16: 10 mg via INTRAVENOUS

## 2019-03-16 MED ORDER — ROCURONIUM BROMIDE 100 MG/10ML IV SOLN
INTRAVENOUS | Status: DC | PRN
  Administered 2019-03-16: 10 mg via INTRAVENOUS
  Administered 2019-03-16: 15 mg via INTRAVENOUS
  Administered 2019-03-16: 10 mg via INTRAVENOUS
  Administered 2019-03-16: 5 mg via INTRAVENOUS

## 2019-03-16 MED ORDER — METOPROLOL TARTRATE 5 MG/5ML IV SOLN
INTRAVENOUS | Status: DC | PRN
  Administered 2019-03-16 (×3): 1 mg via INTRAVENOUS
  Administered 2019-03-16: 2 mg via INTRAVENOUS

## 2019-03-16 MED ORDER — ONDANSETRON HCL 4 MG/2ML IJ SOLN: 4.0000 mg | Freq: Once | INTRAMUSCULAR | Status: DC | PRN

## 2019-03-16 MED ORDER — HEPARIN SODIUM (PORCINE) 5000 UNIT/ML IJ SOLN
INTRAMUSCULAR | Status: AC
  Filled 2019-03-16: qty 1

## 2019-03-16 MED ORDER — HEPARIN SODIUM (PORCINE) 1000 UNIT/ML IJ SOLN
INTRAMUSCULAR | Status: DC | PRN
  Administered 2019-03-16: 4000 [IU] via INTRAVENOUS
  Administered 2019-03-16: 2000 [IU] via INTRAVENOUS

## 2019-03-16 MED ORDER — HEPARIN (PORCINE) 25000 UT/250ML-% IV SOLN
950.0000 [IU]/h | INTRAVENOUS | Status: DC
  Filled 2019-03-16 (×2): qty 250

## 2019-03-16 MED ORDER — VANCOMYCIN HCL IN DEXTROSE 750-5 MG/150ML-% IV SOLN
750.0000 mg | Freq: Two times a day (BID) | INTRAVENOUS | Status: DC
  Administered 2019-03-17: 750 mg via INTRAVENOUS
  Filled 2019-03-16 (×2): qty 150

## 2019-03-16 MED ORDER — FENTANYL CITRATE (PF) 100 MCG/2ML IJ SOLN: 25.0000 ug | INTRAMUSCULAR | Status: DC | PRN

## 2019-03-16 MED ORDER — SUCCINYLCHOLINE CHLORIDE 20 MG/ML IJ SOLN
INTRAMUSCULAR | Status: DC | PRN
  Administered 2019-03-16: 80 mg via INTRAVENOUS

## 2019-03-16 MED ORDER — FENTANYL CITRATE (PF) 100 MCG/2ML IJ SOLN
INTRAMUSCULAR | Status: AC
  Filled 2019-03-16: qty 2

## 2019-03-16 MED ORDER — SODIUM CHLORIDE 0.9 % IV SOLN
INTRAVENOUS | Status: DC | PRN
  Administered 2019-03-16: 500 mL via INTRAMUSCULAR

## 2019-03-16 MED ORDER — LIDOCAINE HCL (CARDIAC) PF 100 MG/5ML IV SOSY
PREFILLED_SYRINGE | INTRAVENOUS | Status: DC | PRN
  Administered 2019-03-16: 50 mg via INTRAVENOUS

## 2019-03-16 MED ORDER — MIDAZOLAM HCL 2 MG/2ML IJ SOLN
INTRAMUSCULAR | Status: AC
  Filled 2019-03-16: qty 2

## 2019-03-16 MED ORDER — MIDAZOLAM HCL 2 MG/2ML IJ SOLN
INTRAMUSCULAR | Status: DC | PRN
  Administered 2019-03-16: 2 mg via INTRAVENOUS

## 2019-03-16 MED ORDER — SODIUM CHLORIDE 0.9 % IV SOLN
2.0000 g | Freq: Two times a day (BID) | INTRAVENOUS | Status: AC
  Administered 2019-03-16 – 2019-03-22 (×12): 2 g via INTRAVENOUS
  Filled 2019-03-16 (×15): qty 2

## 2019-03-16 MED ORDER — IODIXANOL 320 MG/ML IV SOLN
INTRAVENOUS | Status: DC | PRN
  Administered 2019-03-16: 75 mL via INTRA_ARTERIAL

## 2019-03-16 MED ORDER — SUGAMMADEX SODIUM 200 MG/2ML IV SOLN
INTRAVENOUS | Status: DC | PRN
  Administered 2019-03-16: 100 mg via INTRAVENOUS

## 2019-03-16 MED ORDER — PHENYLEPHRINE HCL (PRESSORS) 10 MG/ML IV SOLN
INTRAVENOUS | Status: DC | PRN
  Administered 2019-03-16 (×2): 100 ug via INTRAVENOUS
  Administered 2019-03-16: 200 ug via INTRAVENOUS
  Administered 2019-03-16: 100 ug via INTRAVENOUS

## 2019-03-16 MED ORDER — GLYCOPYRROLATE 0.2 MG/ML IJ SOLN
INTRAMUSCULAR | Status: DC | PRN
  Administered 2019-03-16: 0.2 mg via INTRAVENOUS

## 2019-03-16 SURGICAL SUPPLY — 105 items
"PENCIL ELECTRO HAND CTR " (MISCELLANEOUS) ×1 IMPLANT
APPLIER CLIP 11 MED OPEN (CLIP) ×3
APPLIER CLIP 13 LRG OPEN (CLIP) ×3
APPLIER CLIP 9.375 SM OPEN (CLIP) ×3
BAG COUNTER SPONGE EZ (MISCELLANEOUS) ×2 IMPLANT
BAG DECANTER FOR FLEXI CONT (MISCELLANEOUS) ×5 IMPLANT
BAG ISOLATATION DRAPE 20X20 ST (DRAPES) IMPLANT
BALLN DORADO 8X100X80 (BALLOONS) ×6
BALLOON DORADO 8X100X80 (BALLOONS) IMPLANT
BLADE SURG 15 STRL LF DISP TIS (BLADE) ×2 IMPLANT
BLADE SURG 15 STRL SS (BLADE) ×4
BLADE SURG SZ11 CARB STEEL (BLADE) ×3 IMPLANT
BOOT SUTURE AID YELLOW STND (SUTURE) ×6 IMPLANT
BRUSH SCRUB EZ  4% CHG (MISCELLANEOUS) ×2
BRUSH SCRUB EZ 4% CHG (MISCELLANEOUS) ×1 IMPLANT
CANISTER SUCT 1200ML W/VALVE (MISCELLANEOUS) ×3 IMPLANT
CATH BEACON 5 .035 40 KMP TP (CATHETERS) IMPLANT
CATH BEACON 5 .038 40 KMP TP (CATHETERS) ×2
CATH PIG 70CM (CATHETERS) ×2 IMPLANT
CHLORAPREP W/TINT 26 (MISCELLANEOUS) ×3 IMPLANT
CLIP APPLIE 11 MED OPEN (CLIP) ×1 IMPLANT
CLIP APPLIE 13 LRG OPEN (CLIP) ×1 IMPLANT
CLIP APPLIE 9.375 SM OPEN (CLIP) ×1 IMPLANT
COUNTER SPONGE BAG EZ (MISCELLANEOUS) ×1
COVER WAND RF STERILE (DRAPES) ×3 IMPLANT
DERMABOND ADVANCED (GAUZE/BANDAGES/DRESSINGS) ×4
DERMABOND ADVANCED .7 DNX12 (GAUZE/BANDAGES/DRESSINGS) ×2 IMPLANT
DEVICE PRESTO INFLATION (MISCELLANEOUS) ×4 IMPLANT
DRAPE INCISE IOBAN 66X45 STRL (DRAPES) ×3 IMPLANT
DRAPE ISOLATE BAG 20X20 STRL (DRAPES)
ELECT CAUTERY BLADE 6.4 (BLADE) ×6 IMPLANT
ELECT REM PT RETURN 9FT ADLT (ELECTROSURGICAL) ×6
ELECTRODE REM PT RTRN 9FT ADLT (ELECTROSURGICAL) ×2 IMPLANT
EVICEL AIRLESS SPRAY ACCES (MISCELLANEOUS) ×2 IMPLANT
GAUZE 4X4 16PLY RFD (DISPOSABLE) ×6 IMPLANT
GLIDEWIRE ADV .035X180CM (WIRE) ×2 IMPLANT
GLOVE BIO SURGEON STRL SZ7 (GLOVE) ×6 IMPLANT
GLOVE BIO SURGEON STRL SZ8 (GLOVE) ×3 IMPLANT
GLOVE INDICATOR 7.5 STRL GRN (GLOVE) ×3 IMPLANT
GOWN STRL REUS W/ TWL LRG LVL3 (GOWN DISPOSABLE) ×2 IMPLANT
GOWN STRL REUS W/ TWL XL LVL3 (GOWN DISPOSABLE) ×2 IMPLANT
GOWN STRL REUS W/TWL LRG LVL3 (GOWN DISPOSABLE) ×4
GOWN STRL REUS W/TWL XL LVL3 (GOWN DISPOSABLE) ×4
HEMOSTAT SURGICEL 2X3 (HEMOSTASIS) ×6 IMPLANT
IV NS 1000ML (IV SOLUTION) ×2
IV NS 1000ML BAXH (IV SOLUTION) ×1 IMPLANT
IV NS 500ML (IV SOLUTION) ×2
IV NS 500ML BAXH (IV SOLUTION) ×1 IMPLANT
KIT PREVENA INCISION MGT 13 (CANNISTER) ×4 IMPLANT
KIT TURNOVER KIT A (KITS) ×3 IMPLANT
LABEL OR SOLS (LABEL) ×3 IMPLANT
LOOP RED MAXI  1X406MM (MISCELLANEOUS) ×10
LOOP VESSEL MAXI 1X406 RED (MISCELLANEOUS) ×4 IMPLANT
LOOP VESSEL MINI 0.8X406 BLUE (MISCELLANEOUS) ×2 IMPLANT
LOOPS BLUE MINI 0.8X406MM (MISCELLANEOUS) ×4
NDL HYPO 25X1 1.5 SAFETY (NEEDLE) ×1 IMPLANT
NDL SAFETY ECLIPSE 18X1.5 (NEEDLE) ×1 IMPLANT
NEEDLE HYPO 18GX1.5 SHARP (NEEDLE) ×2
NEEDLE HYPO 25X1 1.5 SAFETY (NEEDLE) ×3 IMPLANT
NS IRRIG 500ML POUR BTL (IV SOLUTION) ×3 IMPLANT
PACK ANGIOGRAPHY (CUSTOM PROCEDURE TRAY) ×2 IMPLANT
PACK BASIN MAJOR ARMC (MISCELLANEOUS) ×3 IMPLANT
PACK UNIVERSAL (MISCELLANEOUS) ×3 IMPLANT
PATCH CAROTID ECM VASC 1X10 (Prosthesis & Implant Heart) ×4 IMPLANT
PENCIL ELECTRO HAND CTR (MISCELLANEOUS) ×3 IMPLANT
SHEATH BRITE TIP 5FRX11 (SHEATH) ×4 IMPLANT
SHEATH BRITE TIP 8FRX11 (SHEATH) ×4 IMPLANT
SPONGE LAP 18X18 RF (DISPOSABLE) ×6 IMPLANT
STENT LIFESTREAM 10X58X80 (Permanent Stent) ×4 IMPLANT
STENT LIFESTREAM 8X58X80 (Permanent Stent) ×4 IMPLANT
STENT LIFESTREAM 9X58X80 (Permanent Stent) ×4 IMPLANT
STENT VIABAHN 8X100X120 (Permanent Stent) ×4 IMPLANT
STENT VIABAHN 8X10X120 (Permanent Stent) IMPLANT
SUT MNCRL 4-0 (SUTURE) ×8
SUT MNCRL 4-0 27XMFL (SUTURE) ×4
SUT MNCRL+ 5-0 UNDYED PC-3 (SUTURE) ×2 IMPLANT
SUT MONOCRYL 5-0 (SUTURE) ×4
SUT PROLENE 5 0 RB 1 DA (SUTURE) ×12 IMPLANT
SUT PROLENE 6 0 BV (SUTURE) ×30 IMPLANT
SUT PROLENE 7 0 BV 1 (SUTURE) ×6 IMPLANT
SUT SILK 2 0 (SUTURE) ×2
SUT SILK 2-0 18XBRD TIE 12 (SUTURE) ×1 IMPLANT
SUT SILK 3 0 (SUTURE) ×2
SUT SILK 3-0 18XBRD TIE 12 (SUTURE) ×1 IMPLANT
SUT SILK 4 0 (SUTURE) ×2
SUT SILK 4-0 18XBRD TIE 12 (SUTURE) ×1 IMPLANT
SUT VIC AB 2-0 CT1 (SUTURE) ×18 IMPLANT
SUT VIC AB 2-0 CT1 27 (SUTURE) ×4
SUT VIC AB 2-0 CT1 TAPERPNT 27 (SUTURE) ×2 IMPLANT
SUT VIC AB 3-0 SH 27 (SUTURE) ×2
SUT VIC AB 3-0 SH 27X BRD (SUTURE) ×1 IMPLANT
SUT VIC AB 4-0 SH 27 (SUTURE) ×4
SUT VIC AB 4-0 SH 27XANBCTRL (SUTURE) ×2 IMPLANT
SUT VICRYL+ 3-0 36IN CT-1 (SUTURE) ×18 IMPLANT
SUTURE MNCRL 4-0 27XMF (SUTURE) ×4 IMPLANT
SYR 10ML LL (SYRINGE) ×3 IMPLANT
SYR 20CC LL (SYRINGE) ×6 IMPLANT
SYR 3ML LL SCALE MARK (SYRINGE) ×3 IMPLANT
SYR 5ML LL (SYRINGE) ×3 IMPLANT
SYR BULB 3OZ (MISCELLANEOUS) ×3 IMPLANT
TOWEL OR 17X26 4PK STRL BLUE (TOWEL DISPOSABLE) ×3 IMPLANT
TRAY FOLEY MTR SLVR 16FR STAT (SET/KITS/TRAYS/PACK) ×3 IMPLANT
TUBING CONTRAST HIGH PRESS 72 (TUBING) ×4 IMPLANT
WIRE G V18X300CM (WIRE) ×4 IMPLANT
WIRE MAGIC TOR.035 180C (WIRE) ×4 IMPLANT

## 2019-03-16 NOTE — Progress Notes (Signed)
Patient removed his mittens. Redirected patient and put mittens back on. Continuing to check on patient every 3-5 minutes to make sure that they are still on.

## 2019-03-16 NOTE — Consult Note (Signed)
NAME: Donald [name].  DOB: [birth year]  MRN: [id number]  Date/Time: 03/16/2019 6:23 PM  REQUESTING PROVIDER: Male Subjective:  REASON FOR CONSULT: Leukocytosis ?No history available from patient.  Chart reviewed.  Spoke to his mother. Donald [name]. is a 58 y.o. male with a history of COPD, alcohol abuse, peripheral artery disease, status post stents both legs, neuropathy, seizure disorders, was recently in the hospital between 03/04/2019 until 03/08/2019 for DTs.   He was sent back to the hospital by his mother because of his deteriorating condition with ongoing delirium and pain in his legs and inability to thrive.  During his previous hospitalization he was brought in because he was found on his porch by the neighbors and was found to be having delirium tremens.  He was also noted to have essential hypertension and tachycardia and was treated appropriately.  He also had prostate hyperplasia and was continued on Flomax.  Has hypokalemia and hypomagnesemia being replaced.  As per his mother patient was sent home on his own but he could not go back to his home so he went to stay with her but over the last 6 days he has deteriorated with ongoing delirium, inability to walk, and not eating.  She also noted that he had discoloration of his toes.  Patient has a history of stents placement bilaterally 10 years ago.  In the ED his blood pressure was 174/107 pulse rate of 162, temperature of 97.7.  He had a WBC of 21.5, platelet of 478, potassium of 3.1, AST of 86 and total bilirubin of 1.8.  Lactic acid was 2.3.  Urine tox screen showed benzodiazepines.  Urine also was positive for leukocytes and elevated WBC. He was started on Vanco and cefepime as he was thought to be in sepsis with possibility of urinary tract infection after sending all the cultures.  Also was noted to have some vascular insufficiency and vascular team was consulted. He underwent angiogram the same day and and had percutaneous  transluminal angioplasty Of the aorta with 6 mm diameter angioplasty balloons from both femoral sheaths.  He also had angioplasty of both common iliac arteries with 6 mm diameter high-pressure angioplasty balloons.  And also the right external iliac artery and proximal common femoral artery. The urine culture came back as staph epidermidis and the antibiotics were switched to ciprofloxacin as the leukocytosis was not improving I am consulted on this patient. Today the patient is undergone extensive endarterectomies involving left common femoral, profunda femoris and superficial femoral arteries.  Right common femoral, profunda femoris and superficial femoral arteries.  He also had placement of bilateral external iliac stents and bilateral common iliac stents and a failed stent placement to the aorta to realign both iliac arteries up to the base of both renal arteries using  Stents.  He has wound VAC on both his groin surgical site. No history is available from the patient.  Past Medical History:  Diagnosis Date  . Depression   . Emphysema of lung ([place])   . Essential hypertension   . History of chicken pox   . Low weight   . Neuropathy    bilateral lower legs  . Peripheral vascular disease ([place])   . Prostate disease   . Seasonal allergies   . Seizures ([place])   . Wears dentures    full upper    Past Surgical History:  Procedure Laterality Date  . CATARACT EXTRACTION W/PHACO Left 05/18/2018   Procedure: CATARACT EXTRACTION PHACO AND INTRAOCULAR  LENS PLACEMENT (IOC) LEFT;  Surgeon: Leandrew Koyanagi, MD;  Location: [facility];  Service: Ophthalmology;  Laterality: Left;  . COLONOSCOPY WITH PROPOFOL N/A 02/25/2016   Procedure: COLONOSCOPY WITH PROPOFOL;  Surgeon: [name], MD;  Location: [facility] END[facility]COPY;  Service: Endoscopy;  Laterality: N/A;  . EYE SURGERY  2015   Cataract  . LOWER EXTREMITY ANGIOGRAPHY Left 03/13/2019   Procedure: Lower Extremity Angiography (LEFT);  Surgeon: Algernon Huxley, MD;  Location: [facility];  Service: Cardiovascular;  Laterality: Left;  [facility] VASCULAR SURGERY     "7 stents in legs"    Social History   Socioeconomic History  . Marital status: Single    Spouse name: Not on file  . Number of children: Not on file  . Years of education: Not on file  . Highest education level: Not on file  Occupational History  . Not on file  Social Needs  . Financial resource strain: Not on file  . Food insecurity    Worry: Not on file    Inability: Not on file  . Transportation needs    Medical: Not on file    Non-medical: Not on file  Tobacco Use  . Smoking status: Former Smoker    Packs/day: 0.50    Years: 35.00    Pack years: 17.50    Types: Cigarettes    Quit date: 01/25/2018    Years since quitting: 1.1  . Smokeless tobacco: Former Systems developer    Quit date: 08/31/2016  . Tobacco comment: Previously quit in 09/2016 on Chantix until 09/2017  Substance and Sexual Activity  . Alcohol use: Not Currently    Alcohol/week: 0.0 standard drinks    Comment: occ  . Drug use: No  . Sexual activity: Not on file  Lifestyle  . Physical activity    Days per week: 0 days    Minutes per session: Not on file  . Stress: Not on file  Relationships  . Social Herbalist on phone: Not on file    Gets together: Not on file    Attends religious service: Not on file    Active member of club or organization: Not on file    Attends meetings of clubs or organizations: Not on file    Relationship status: Not on file  . Intimate partner violence    Fear of current or ex partner: No    Emotionally abused: No    Physically abused: No    Forced sexual activity: No  Other Topics Concern  . Not on file  Social History Narrative  . Not on file    Family History  Problem Relation Age of Onset  . Ulcers Father        bleeding  . Cancer Mother        Breast   No Known Allergies  ? Current Facility-Administered Medications  Medication Dose Route  Frequency Provider Last Rate Last Dose  . 0.9 %  sodium chloride infusion   Intravenous Continuous Algernon Huxley, MD 75 mL/hr at 03/16/19 1729    . amLODipine (NORVASC) tablet 10 mg  10 mg Oral Daily Algernon Huxley, MD   10 mg at 03/15/19 1332  . aspirin EC tablet 81 mg  81 mg Oral Daily Algernon Huxley, MD   81 mg at 03/15/19 1332  . Chlorhexidine Gluconate Cloth 2 % PADS 6 each  6 each Topical Q0600 Algernon Huxley, MD   6 each at 03/16/19  4259  . cholecalciferol (VITAMIN D3) tablet 1,000 Units  1,000 Units Oral Daily Algernon Huxley, MD   1,000 Units at 03/15/19 1333  . ciprofloxacin (CIPRO) tablet 500 mg  500 mg Oral BID Algernon Huxley, MD   500 mg at 03/15/19 2245  . cloNIDine (CATAPRES) tablet 0.1 mg  0.1 mg Oral BID Algernon Huxley, MD   0.1 mg at 03/15/19 2244  . feeding supplement (ENSURE ENLIVE) (ENSURE ENLIVE) liquid 237 mL  237 mL Oral TID BM Algernon Huxley, MD   237 mL at 03/15/19 1322  . folic acid (FOLVITE) tablet 1 mg  1 mg Oral Daily Algernon Huxley, MD   1 mg at 03/15/19 1333  . Gerhardt's butt cream   Topical TID Algernon Huxley, MD      . Derrill Memo ON 03/17/2019] heparin ADULT infusion 100 units/mL (25000 units/26mL sodium chloride 0.45%)  950 Units/hr Intravenous Continuous Mayo, Pete Pelt, MD      . hydrALAZINE (APRESOLINE) injection 5 mg  5 mg Intravenous Q4H PRN Algernon Huxley, MD      . HYDROmorphone (DILAUDID) injection 1 mg  1 mg Intravenous Once PRN Algernon Huxley, MD      . ipratropium-albuterol (DUONEB) 0.5-2.5 (3) MG/3ML nebulizer solution 3 mL  3 mL Nebulization BID Algernon Huxley, MD   3 mL at 03/16/19 0736  . labetalol (NORMODYNE) injection 10 mg  10 mg Intravenous Q2H PRN Algernon Huxley, MD   10 mg at 03/13/19 1415  . megestrol (MEGACE) 400 MG/10ML suspension 200 mg  200 mg Oral BID Algernon Huxley, MD   200 mg at 03/15/19 2244  . metoprolol succinate (TOPROL-XL) 24 hr tablet 50 mg  50 mg Oral Daily Algernon Huxley, MD   50 mg at 03/15/19 1333  . metoprolol tartrate (LOPRESSOR) injection 5 mg  5 mg  Intravenous Q6H PRN Algernon Huxley, MD   5 mg at 03/16/19 0825  . multivitamin with minerals tablet 1 tablet  1 tablet Oral Daily Algernon Huxley, MD   1 tablet at 03/15/19 1333  . mupirocin ointment (BACTROBAN) 2 % 1 application  1 application Nasal BID Algernon Huxley, MD   1 application at 56/38/75 2336  . ondansetron (ZOFRAN) injection 4 mg  4 mg Intravenous Q6H PRN Algernon Huxley, MD      . potassium chloride SA (K-DUR) CR tablet 40 mEq  40 mEq Oral BID Algernon Huxley, MD   40 mEq at 03/15/19 2244  . sodium chloride flush (NS) 0.9 % injection 10-40 mL  10-40 mL Intracatheter PRN Algernon Huxley, MD      . tamsulosin (FLOMAX) capsule 0.4 mg  0.4 mg Oral QPC supper Algernon Huxley, MD      . Derrill Memo ON 03/17/2019] thiamine (B-1) 250 mg in sodium chloride 0.9 % 50 mL IVPB  250 mg Intravenous Q24H Algernon Huxley, MD      . Derrill Memo ON 03/20/2019] thiamine (VITAMIN B-1) tablet 100 mg  100 mg Oral Daily Dew, Erskine Squibb, MD      . vitamin C (ASCORBIC ACID) tablet 250 mg  250 mg Oral BID Algernon Huxley, MD   250 mg at 03/15/19 2244     Abtx:  Anti-infectives (From admission, onward)   Start     Dose/Rate Route Frequency Ordered Stop   03/16/19 1100  ceFAZolin (ANCEF) IVPB 2g/100 mL premix    Note to Pharmacy: Please send with  patient to OR   2 g 200 mL/hr over 30 Minutes Intravenous 30 min pre-op 03/15/19 1236 03/16/19 1243   03/15/19 2000  ciprofloxacin (CIPRO) tablet 500 mg     500 mg Oral 2 times daily 03/15/19 1515     03/15/19 0957  ceFAZolin (ANCEF) IVPB 2g/100 mL premix  Status:  Discontinued     2 g 200 mL/hr over 30 Minutes Intravenous 30 min pre-op 03/15/19 0957 03/15/19 1236   03/15/19 0909  ceFAZolin (ANCEF) IVPB 2g/100 mL premix  Status:  Discontinued     2 g 200 mL/hr over 30 Minutes Intravenous 30 min pre-op 03/15/19 0909 03/15/19 0956   03/15/19 0800  vancomycin (VANCOCIN) IVPB 1000 mg/200 mL premix  Status:  Discontinued     1,000 mg 200 mL/hr over 60 Minutes Intravenous Every 24 hours 03/14/19 1339  03/15/19 1515   03/14/19 0000  vancomycin (VANCOCIN) IVPB 750 mg/150 ml premix  Status:  Discontinued     750 mg 150 mL/hr over 60 Minutes Intravenous Every 12 hours 03/13/19 1512 03/14/19 1339   03/13/19 2000  ceFEPIme (MAXIPIME) 2 g in sodium chloride 0.9 % 100 mL IVPB  Status:  Discontinued     2 g 200 mL/hr over 30 Minutes Intravenous Every 8 hours 03/13/19 1512 03/15/19 0753   03/13/19 1015  ceFEPIme (MAXIPIME) 2 g in sodium chloride 0.9 % 100 mL IVPB     2 g 200 mL/hr over 30 Minutes Intravenous  Once 03/13/19 1012 03/13/19 1139   03/13/19 1015  metroNIDAZOLE (FLAGYL) IVPB 500 mg     500 mg 100 mL/hr over 60 Minutes Intravenous  Once 03/13/19 1012 03/13/19 1243   03/13/19 1015  vancomycin (VANCOCIN) IVPB 1000 mg/200 mL premix     1,000 mg 200 mL/hr over 60 Minutes Intravenous  Once 03/13/19 1012 03/13/19 1316      REVIEW OF SYSTEMS:  Not available ? Pertinent Positives include : Objective:  VITALS:  BP (!) 158/95 (BP Location: Left Arm)   Pulse (!) 114   Temp (!) 96.9 F (36.1 C)   Resp (!) 23   Ht 5\' 9"  (1.753 m)   Wt 49.2 kg   SpO2 99%   BMI 16.02 kg/m  PHYSICAL EXAM:  General: Lethargic in bed with eyes closed.  On calling his name he opens his eyes and would respond by mumbling.  He did follow commands like opening his mouth. Head: Normocephalic, without obvious abnormality, atraumatic. Eyes: Did not examine  ENT Nares normal. No drainage or sinus tenderness. Oral cavity edentulous mucosa very dry with sticky secretions Neck: Supple, no carotid bruit and no JVD. Back: No CVA tenderness. Lungs: Bilateral air entry.  Decreased in the bases. Heart: S1-S2 Abdomen: Soft, Extremities: Bilateral discoloration of his toes right more than left with an area of violaceous discoloration extending on the dorsum of the right foot       Penile erosions present, and also erosion over the right groin area   Also has intertrigo  Lymph: Cervical, supraclavicular  normal. Neurologic: Could not examine in detail Pertinent Labs Lab Results CBC    Component Value Date/Time   WBC 26.2 (H) 03/16/2019 0557   RBC 3.55 (L) 03/16/2019 0557   HGB 12.2 (L) 03/16/2019 0557   HGB 14.9 07/31/2015 1117   HCT 37.5 (L) 03/16/2019 0557   HCT 42.5 07/31/2015 1117   PLT 255 03/16/2019 0557   PLT 161 07/31/2015 1117   MCV 105.6 (H) 03/16/2019 0557   MCV  96 07/31/2015 1117   MCV 98 11/20/2011 0510   MCH 34.4 (H) 03/16/2019 0557   MCHC 32.5 03/16/2019 0557   RDW 14.2 03/16/2019 0557   RDW 13.8 07/31/2015 1117   RDW 14.9 (H) 11/20/2011 0510   LYMPHSABS 1.7 03/14/2019 0501   LYMPHSABS 1.4 07/31/2015 1117   LYMPHSABS 1.1 11/20/2011 0510   MONOABS 1.4 (H) 03/14/2019 0501   MONOABS 0.8 (H) 11/20/2011 0510   EOSABS 0.0 03/14/2019 0501   EOSABS 0.2 07/31/2015 1117   EOSABS 0.1 11/20/2011 0510   BASOSABS 0.0 03/14/2019 0501   BASOSABS 0.0 07/31/2015 1117   BASOSABS 0.0 11/20/2011 0510    CMP Latest Ref Rng & Units 03/16/2019 03/14/2019 03/14/2019  Glucose 70 - 99 mg/dL 94 130(H) -  BUN 6 - 20 mg/dL 17 15 -  Creatinine 0.61 - 1.24 mg/dL 0.83 0.86 -  Sodium 135 - 145 mmol/L 149(H) 144 -  Potassium 3.5 - 5.1 mmol/L 3.6 3.2(L) 3.7  Chloride 98 - 111 mmol/L 118(H) 115(H) -  CO2 22 - 32 mmol/L 21(L) 21(L) -  Calcium 8.9 - 10.3 mg/dL 9.3 8.9 -  Total Protein 6.5 - 8.1 g/dL - - -  Total Bilirubin 0.3 - 1.2 mg/dL - - -  Alkaline Phos 38 - 126 U/L - - -  AST 15 - 41 U/L - - -  ALT 0 - 44 U/L - - -      Microbiology: Recent Results (from the past 240 hour(s))  Urine culture     Status: Abnormal   Collection Time: 03/13/19  9:56 AM   Specimen: Urine, Clean Catch  Result Value Ref Range Status   Specimen Description   Final    URINE, CLEAN CATCH Performed at [facility], [address] [place], [place] [address]    Special Requests   Final    NONE Performed at [facility], [address] [place], [place] [address]    Culture >=100,000  COLONIES/mL STAPHYLOCOCCUS EPIDERMIDIS (A)  Final   Report Status 03/15/2019 FINAL  Final   Organism ID, Bacteria STAPHYLOCOCCUS EPIDERMIDIS (A)  Final      Susceptibility   Staphylococcus epidermidis - MIC*    CIPROFLOXACIN <=0.5 SENSITIVE Sensitive     GENTAMICIN <=0.5 SENSITIVE Sensitive     NITROFURANTOIN <=16 SENSITIVE Sensitive     OXACILLIN <=0.25 SENSITIVE Sensitive     TETRACYCLINE >=16 RESISTANT Resistant     VANCOMYCIN 2 SENSITIVE Sensitive     TRIMETH/SULFA <=10 SENSITIVE Sensitive     CLINDAMYCIN <=0.25 SENSITIVE Sensitive     RIFAMPIN <=0.5 SENSITIVE Sensitive     Inducible Clindamycin NEGATIVE Sensitive     * >=100,000 COLONIES/mL STAPHYLOCOCCUS EPIDERMIDIS  SARS Coronavirus 2 (CEPHEID - Performed in [facility] hospital lab), Hosp Order     Status: None   Collection Time: 03/13/19  9:56 AM   Specimen: Nasopharyngeal Swab  Result Value Ref Range Status   SARS Coronavirus 2 NEGATIVE NEGATIVE Final    Comment: (NOTE) If result is NEGATIVE SARS-CoV-2 target nucleic acids are NOT DETECTED. The SARS-CoV-2 RNA is generally detectable in upper and lower  respiratory specimens during the acute phase of infection. The lowest  concentration of SARS-CoV-2 viral copies this assay can detect is 250  copies / mL. A negative result does not preclude SARS-CoV-2 infection  and should not be used as the sole basis for treatment or other  patient management decisions.  A negative result may occur with  improper specimen collection /  handling, submission of specimen other  than nasopharyngeal swab, presence of viral mutation(s) within the  areas targeted by this assay, and inadequate number of viral copies  (<250 copies / mL). A negative result must be combined with clinical  observations, patient history, and epidemiological information. If result is POSITIVE SARS-CoV-2 target nucleic acids are DETECTED. The SARS-CoV-2 RNA is generally detectable in upper and lower  respiratory  specimens dur ing the acute phase of infection.  Positive  results are indicative of active infection with SARS-CoV-2.  Clinical  correlation with patient history and other diagnostic information is  necessary to determine patient infection status.  Positive results do  not rule out bacterial infection or co-infection with other viruses. If result is PRESUMPTIVE POSTIVE SARS-CoV-2 nucleic acids MAY BE PRESENT.   A presumptive positive result was obtained on the submitted specimen  and confirmed on repeat testing.  While 2019 novel coronavirus  (SARS-CoV-2) nucleic acids may be present in the submitted sample  additional confirmatory testing may be necessary for epidemiological  and / or clinical management purposes  to differentiate between  SARS-CoV-2 and other Sarbecovirus currently known to infect humans.  If clinically indicated additional testing with an alternate test  methodology [phone]) is advised. The SARS-CoV-2 RNA is generally  detectable in upper and lower respiratory sp ecimens during the acute  phase of infection. The expected result is Negative. Fact Sheet for Patients:  StrictlyIdeas.no Fact Sheet for Healthcare Providers: BankingDealers.co.za This test is not yet approved or cleared by the [place] FDA and has been authorized for detection and/or diagnosis of SARS-CoV-2 by FDA under an Emergency Use Authorization (EUA).  This EUA will remain in effect (meaning this test can be used) for the duration of the COVID-19 declaration under Section 564(b)(1) of the Act, 21 U.S.C. section 360bbb-3(b)(1), unless the authorization is terminated or revoked sooner. Performed at [facility], [address] [place], [place] [address]   Blood Culture (routine x 2)     Status: None (Preliminary result)   Collection Time: 03/13/19 10:39 AM   Specimen: BLOOD  Result Value Ref Range Status   Specimen Description BLOOD  BLOOD RIGHT FOREARM  Final   Special Requests   Final    BOTTLES DRAWN AEROBIC AND ANAEROBIC Blood Culture results may not be optimal due to an excessive volume of blood received in culture bottles   Culture   Final    NO GROWTH 3 DAYS Performed at [facility], [address] [place], [place] [address]    Report Status PENDING  Incomplete  Blood Culture (routine x 2)     Status: None (Preliminary result)   Collection Time: 03/13/19 10:39 AM   Specimen: BLOOD  Result Value Ref Range Status   Specimen Description BLOOD FOREARM  Final   Special Requests   Final    BOTTLES DRAWN AEROBIC AND ANAEROBIC Blood Culture adequate volume   Culture   Final    NO GROWTH 3 DAYS Performed at [facility], [address] [place], [place] [address]    Report Status PENDING  Incomplete  MRSA PCR Screening     Status: Abnormal   Collection Time: 03/13/19  7:04 PM   Specimen: Nasal Mucosa; Nasopharyngeal  Result Value Ref Range Status   MRSA by PCR POSITIVE (A) NEGATIVE Final    Comment:        The GeneXpert MRSA Assay (FDA approved for NASAL specimens only), is one component of a comprehensive MRSA colonization  surveillance program. It is not intended to diagnose MRSA infection nor to guide or monitor treatment for MRSA infections. RESULT CALLED TO, READ BACK BY AND VERIFIED WITH: VIVIAN ALI @2021  03/13/19 MJU Performed at [facility], [address] [place], [place] [address]     IMAGING RESULTS: CT head shows atrophy and chronic ischemic changes.     I have personally reviewed the films ? Impression/Recommendation ? ?58 year old male with history of COPD, EtOH abuse, recent hospitalization for DVT, peripheral arterial disease presents with inability to walk with discoloration of the toes and and for hallucination  Severe leukocytosis likely related to the ischemia of the toes with peripheral arterial disease.  I doubt this is due to urinary tract  infection with staph epidermidis. Patient is undergone extensive endarterectomies on bilateral lower extremities arteries.  He does have a wound VAC on both groins.  We will DC the ciprofloxacin and put him on vancomycin and cefepime.  Staph epidermidis in the urinary culture is a contaminant from the skin.  Penile erosions.  Likely due to the condom cath use in the previous hospitalization but need to rule out herpetic ulceration.  Will send aerobic culture as well as HSV.  He currently has a Foley catheter in place.  Stage II sacral decub as told by his nurse.  Failure to thrive from malnutrition.  Delirium, hallucination could be due to the alcohol use also need to rule out Wernicke's encephalopathy or Korsakoff psychosis. HIV nonreactive in June. We will also check for RPR____  Discussed the management with the mother and his nurse Note:  This document was prepared using Dragon voice recognition software and may include unintentional dictation errors.

## 2019-03-16 NOTE — Anesthesia Procedure Notes (Signed)
Procedure Name: Intubation Performed by: Kelton Pillar, CRNA Pre-anesthesia Checklist: Patient identified, Emergency Drugs available, Suction available and Patient being monitored Patient Re-evaluated:Patient Re-evaluated prior to induction Oxygen Delivery Method: Circle system utilized Preoxygenation: Pre-oxygenation with 100% oxygen Induction Type: IV induction Ventilation: Mask ventilation without difficulty Laryngoscope Size: McGraph and 3 Grade View: Grade I Tube type: Oral Tube size: 6.5 mm Number of attempts: 1 Airway Equipment and Method: Stylet,  Oral airway and Video-laryngoscopy Placement Confirmation: ETT inserted through vocal cords under direct vision,  positive ETCO2 and breath sounds checked- equal and bilateral Secured at: 22 cm Tube secured with: Tape Dental Injury: Teeth and Oropharynx as per pre-operative assessment  Difficulty Due To: Difficult Airway- due to dentition and Difficulty was anticipated

## 2019-03-16 NOTE — Anesthesia Post-op Follow-up Note (Signed)
Anesthesia QCDR form completed.        

## 2019-03-16 NOTE — H&P (Signed)
[facility] History & Physical Update  The patient was interviewed and re-examined.  The patient's previous History and Physical has been reviewed and is unchanged.  There is no change in the plan of care. We plan to proceed with the scheduled procedure.  Leotis Pain, MD  03/16/2019, 11:15 AM

## 2019-03-16 NOTE — Consult Note (Signed)
Pharmacy Antibiotic Note  Donald [name]. is a 58 y.o. male admitted on 03/13/2019 with leucocytosis, penis wound.  Pharmacy has been consulted for vancomycin dosing. Also on cefepime for wound infection.   Plan: Cefepime 2g q8H   Vancomycin 1000 mg loading dose, followed by vancomycin 750 mg q12H. Predicted AUC 481. Goal AUC 400-550. Plan to order levels in 4-5 days. Monitor renal function. Last vancomycin dose given 7/8 @0813 .   Height: 5\' 9"  (175.3 cm) Weight: 108 lb 7.5 oz (49.2 kg) IBW/kg (Calculated) : 70.7  Temp (24hrs), Avg:97.5 F (36.4 C), Min:96.9 F (36.1 C), Max:98.3 F (36.8 C)  Recent Labs  Lab 03/13/19 0931 03/13/19 1039 03/13/19 1207 03/14/19 0501 03/14/19 0847 03/14/19 2334 03/15/19 0111 03/16/19 0557  WBC 21.5*  --   --  22.0*  --   --  26.4* 26.2*  CREATININE 0.72  --   --  0.81  --  0.86  --  0.83  LATICACIDVEN  --  2.3* 2.3*  --  1.6  --   --   --     Estimated Creatinine Clearance: 70 mL/min (by C-G formula based on SCr of 0.83 mg/dL).    No Known Allergies  Antimicrobials this admission: 7/6 cefepime >>  7/9 vancomcyin >>   Dose adjustments this admission: None  Microbiology results: 7/6 BCx: pending 7/6 UCx:  STAPHYLOCOCCUS EPIDERMIDIS 7/6 MRSA PCR: positive   Thank you for allowing pharmacy to be a part of this patient's care.  Oswald Hillock, PharmD, BCPS Clinical Pharmacist  03/16/2019 7:47 PM

## 2019-03-16 NOTE — Anesthesia Postprocedure Evaluation (Signed)
Anesthesia Post Note  Patient: Donald [name].  Procedure(s) Performed: ENDARTERECTOMY FEMORAL (Bilateral ) AORTIC ILIAC STENT (Bilateral )  Patient location during evaluation: PACU Anesthesia Type: General Level of consciousness: awake and alert Pain management: pain level controlled Vital Signs Assessment: post-procedure vital signs reviewed and stable Respiratory status: spontaneous breathing, nonlabored ventilation and respiratory function stable Cardiovascular status: blood pressure returned to baseline and stable Postop Assessment: no apparent nausea or vomiting Anesthetic complications: no     Last Vitals:  Vitals:   03/16/19 1704 03/16/19 1726  BP: (!) 146/97 (!) 158/95  Pulse: (!) 108 (!) 114  Resp: (!) 23   Temp: (!) 36.1 C   SpO2: 100% 99%    Last Pain:  Vitals:   03/16/19 1704  TempSrc:   PainSc: [place]

## 2019-03-16 NOTE — Progress Notes (Signed)
[place] [facility] [place] NAME: Donald [name]    MR#:  [id number]  DATE OF BIRTH:  March 04, 1961  SUBJECTIVE:  CHIEF COMPLAINT:   Chief Complaint  Patient presents with  . Altered Mental Status  Patient denies any cough, shortness of breath, diarrhea, abdominal pain. Confused this am.   REVIEW OF SYSTEMS:  Review of Systems  Respiratory: Negative for cough and shortness of breath.   Gastrointestinal: Negative for abdominal pain.   DRUG ALLERGIES:  No Known Allergies VITALS:  Blood pressure (!) 152/85, pulse (!) 129, temperature (!) 97.4 F (36.3 C), temperature source Oral, resp. rate 16, height 5\' 9"  (1.753 m), weight 49.2 kg, SpO2 98 %. PHYSICAL EXAMINATION:  Physical Exam Vitals signs reviewed.  Constitutional:      Appearance: He is ill-appearing.     Comments: Frail, malnourished  HENT:     Head: Normocephalic and atraumatic.     Nose: Nose normal.     Mouth/Throat:     Pharynx: Oropharynx is clear.     Comments: Severe tooth decay and poor dentition with no obvious signs of abscess Pulmonary:     Effort: Pulmonary effort is normal.  Abdominal:     General: Abdomen is flat.     Palpations: Abdomen is soft.  Neurological:     General: No focal deficit present.     Mental Status: He is alert. He is disoriented.    LABORATORY PANEL:  Male CBC Recent Labs  Lab 03/16/19 0557  WBC 26.2*  HGB 12.2*  HCT 37.5*  PLT 255   ------------------------------------------------------------------------------------------------------------------ Chemistries  Recent Labs  Lab 03/13/19 0931  03/16/19 0557  NA 146*   < > 149*  K 3.1*   < > 3.6  CL 110   < > 118*  CO2 21*   < > 21*  GLUCOSE 140*   < > 94  BUN 18   < > 17  CREATININE 0.72   < > 0.83  CALCIUM 10.5*   < > 9.3  MG  --    < > 2.0  AST 86*  --   --   ALT 29  --   --   ALKPHOS 87  --   --   BILITOT 1.8*  --   --    < > = values in this interval not displayed.    RADIOLOGY:  No results found. ASSESSMENT AND PLAN:  MAKSYM PFIFFNER Jr.is a 58yo male with a PMH of PAD, h/o ETOH use disorder, COPD, HTN, BPH, h/o tobacco use disorder, h/o seizure, and protein-calorie malnutrition who presented with worsening AMS found to be septic.  1.Urosepsis, remains afebrile, but with leukocytosis and tachycardia- not ruling out other sources, see below - WBCs remain elevated (26.4)> CXR with no signs of acute process, procalcitonin 0.53; no diarrhea noted; may need to r/o abdominal or pelvic abscess vs considering osteomyelitis vs discitis given hx of ulcers- ulcers do not appear infected  - consider ID consult given continued elevated WBC with AMS -Blood cultures, NGx3d - Urine culture with staph epidermidis, continue Cipro 500mg  BID - MRSA PCRpositive - IVFs and PRN bolus to keep MAP >60 per sepsis protocol - palliative care following, patient now DNR  2.Altered mental status- unable to obtain hx - Monitor neurological status  3.PAD with ischemic feet BL s/p PCI 03/13/19, for revascularization on 03/16/2019 - Vascular surgeryfollowing-patient to go for surgery for revascularization with possible femoral endarterectomy/extensive aortoiliac stenting -continueheparin  gtt per vascular sx  4. ETOH abuse - Recent hospitalizationforDTs -Noactivesigns of withdrawal, CIWA monitoring - Check daily BMP+Mg- wnl 7/9 - Daily Folate, MVI; continue high-dose IV thiamine x3 days - PT/OT evaluation for mobility  5. Sacral/ scrotal decubiti ulcers - patient will need revascularization to improve healing, see above PAD - wound care consulted - Foley catheter in place to help keep the area dry  6. Hypokalemia Hypomagnesemia -Monitor K and mag, replete as indicated  7.HTN-Goal BP <140/90 -OnClonidine, metoprolol and amlodipine - IV labetalol PRN  8.Severe, protein calorie malnutrition- likely 2/2 ETOH use disorder - nutrition following   All the records are reviewed and case discussed with Care Management/Social Worker. Management plans discussed with the patient, family and they are in agreement.  CODE STATUS: DNR  POSSIBLE D/C IN 2-3 DAYS, DEPENDING ON CLINICAL CONDITION.  [place] Arn Mcomber, DO PGY-3, [name]  03/16/2019 at 8:04 AM  Between 7am to 6pm - Pager - [phone]  After 6pm go to [email or web address] - Proofreader  [facility]  Office  [phone]  CC: Primary care physician; Olin Hauser, DO

## 2019-03-16 NOTE — Consult Note (Signed)
ANTICOAGULATION CONSULT NOTE - Initial Consult  Pharmacy Consult for Heparin Indication: aortic occlusion  No Known Allergies  Patient Measurements: Height: 5\' 9"  (175.3 cm) Weight: 108 lb 7.5 oz (49.2 kg) IBW/kg (Calculated) : 70.7 Heparin Dosing Weight: 49.2  Vital Signs: BP: 132/76 (07/09 0550) Pulse Rate: 109 (07/09 0550)  Labs: Recent Labs    03/13/19 0956  03/14/19 0501  03/14/19 2334 03/15/19 0111 03/15/19 0756 03/16/19 0557  HGB  --   --  12.8*  --   --  13.0  --  12.2*  HCT  --   --  37.3*  --   --  38.2*  --  37.5*  PLT  --   --  288  --   --  277  --  255  APTT 31  --   --   --   --  68*  --  75*  LABPROT 14.0  --   --   --   --  16.2*  --  15.9*  INR 1.1  --   --   --   --  1.3*  --  1.3*  HEPARINUNFRC  --    < >  --    < >  --  0.37 0.38 0.40  CREATININE  --   --  0.81  --  0.86  --   --  0.83   < > = values in this interval not displayed.    Estimated Creatinine Clearance: 70 mL/min (by C-G formula based on SCr of 0.83 mg/dL).   Medical History: Past Medical History:  Diagnosis Date  . Depression   . Emphysema of lung ([place])   . Essential hypertension   . History of chicken pox   . Low weight   . Neuropathy    bilateral lower legs  . Peripheral vascular disease ([place])   . Prostate disease   . Seasonal allergies   . Seizures ([place])   . Wears dentures    full upper    Medications:  Medications Prior to Admission  Medication Sig Dispense Refill Last Dose  . acetaminophen (TYLENOL) 500 MG tablet Take 500 mg by mouth every 6 (six) hours as needed.   03/11/2019  . albuterol (VENTOLIN HFA) 108 (90 Base) MCG/ACT inhaler Inhale 2 puffs into the lungs every 6 (six) hours as needed for wheezing or shortness of breath. 8.5 g 2 Unknown at PRN  . amLODipine (NORVASC) 10 MG tablet Take 1 tablet (10 mg total) by mouth daily. 30 tablet 0 03/12/2019 at 1000  . aspirin 81 MG tablet Take 1 tablet (81 mg total) by mouth daily. 30 tablet 11 03/08/2019 at Unknown time   . cloNIDine (CATAPRES) 0.1 MG tablet Take 1 tablet (0.1 mg total) by mouth 2 (two) times daily. 60 tablet 0 03/08/2019 at Unknown time  . feeding supplement, ENSURE ENLIVE, (ENSURE ENLIVE) LIQD Take 237 mLs by mouth 2 (two) times daily between meals. 14220 mL 0 03/11/2019  . Fluticasone-Salmeterol (ADVAIR) 250-50 MCG/DOSE AEPB Inhale 1 puff into the lungs 2 (two) times daily. 60 each 11 03/08/2019 at Unknown time  . megestrol (MEGACE) 40 MG tablet Take 2-4 tablets (80-160 mg total) by mouth daily. Start with 2 tablets a day, then gradually increase as tolerated up to 4 per day 120 tablet 0 Past Week at Unknown time  . metoprolol succinate (TOPROL-XL) 50 MG 24 hr tablet Take 1 tablet (50 mg total) by mouth daily. 90 tablet 1 03/08/2019 at Unknown time  .  tamsulosin (FLOMAX) 0.4 MG CAPS capsule Take 1 capsule (0.4 mg total) by mouth daily after supper. 90 capsule 3 03/08/2019 at Unknown time  . chlordiazePOXIDE (LIBRIUM) 25 MG capsule 25 mg p.o. twice daily x2 days Then 25 mg p.o. daily x2 days (Patient not taking: Reported on 03/09/2019) 6 capsule 0 03/07/2019 at Unknown time   Scheduled:  . amLODipine  10 mg Oral Daily  . aspirin EC  81 mg Oral Daily  . Chlorhexidine Gluconate Cloth  6 each Topical Q0600  . cholecalciferol  1,000 Units Oral Daily  . ciprofloxacin  500 mg Oral BID  . cloNIDine  0.1 mg Oral BID  . feeding supplement (ENSURE ENLIVE)  237 mL Oral TID BM  . folic acid  1 mg Oral Daily  . Gerhardt's butt cream   Topical TID  . ipratropium-albuterol  3 mL Nebulization BID  . megestrol  200 mg Oral BID  . metoprolol succinate  50 mg Oral Daily  . multivitamin with minerals  1 tablet Oral Daily  . mupirocin ointment  1 application Nasal BID  . potassium chloride  40 mEq Oral BID  . tamsulosin  0.4 mg Oral QPC supper  . [START ON 03/20/2019] thiamine  100 mg Oral Daily  . vitamin C  250 mg Oral BID   Infusions:  . sodium chloride 75 mL/hr at 03/16/19 0257  . sodium chloride    .  ceFAZolin  (ANCEF) IV    . heparin 950 Units/hr (03/16/19 0257)  . [START ON 03/17/2019] thiamine injection    . thiamine injection Stopped (03/15/19 2314)   PRN: hydrALAZINE, HYDROmorphone (DILAUDID) injection, labetalol, LORazepam **OR** LORazepam, metoprolol tartrate, ondansetron (ZOFRAN) IV, sodium chloride flush  Assessment: Pharmacy consulted for heparin. No DOAC PTA.   7/6 Heparin infusion started @ 750 units/hr  7/6 @ 2315 HL: 0.14. Level subtherapeutic.  Infusion increased to 900 units/hr.  7/7 @ 0503 HL: 0.47. Level is therapeutic x 1- heparin infusion rate of  900 units/hr. 7/7 @ 1229 HL: 0.26. Level is subtherapeutic. 700 unit bolus and rate increased to 950 units/hr.  7/7 @ 1857 HL: 0.53. Level is therapeutic.  7/8 @ 0111 HL: 0.37, aPTT: 68, Both levels are therapeutic. 7/8 @ 0756 HL: 0.38.  Level is therapeutic x 2.    Goal of Therapy:  Heparin level 0.3-0.7 units/ml Monitor platelets by anticoagulation protocol: Yes   Plan:  7/9 @ 0557 aPTT: 75, HL: 0.40. Both levels are therapeutic and correlating.  Continue current heparin infusion rate at 950 units/hr Continue to check anti-Xa levels & CBC daily while on heparin Continue to monitor H&H and platelets  Pernell Dupre, PharmD, BCPS Clinical Pharmacist 03/16/2019 6:32 AM

## 2019-03-16 NOTE — Progress Notes (Signed)
After stopping the heparin drip patient refuses to let me flush the line.  He became combative and forcefully grabbed my arm.  Will get assistance to restrain him so the midline can be flushed.

## 2019-03-16 NOTE — Chronic Care Management (AMB) (Signed)
  Care Management   Follow Up Note   03/16/2019 Name: Donald [name]. MRN: [id number] DOB: 03-01-1961  Referred by: Olin Hauser, DO Reason for referral : Care Coordination   Donald [name]. is a 58 y.o. year old male who is a primary care patient of Olin Hauser, DO. The care management team was consulted for assistance with care management and care coordination needs.    Review of patient status, including review of consultants reports, relevant laboratory and other test results, and collaboration with appropriate care team members and the patient's provider was performed as part of comprehensive patient evaluation and provision of chronic care management services.    Goals Addressed    . "I need a higher level of care now."       Current Barriers:  . Financial constraints . Limited social support . Level of care concerns . ADL IADL limitations . Mental Health Concerns  . Substance abuse issues - alcohol . Limited education about LTC placement*  Clinical Social Work Clinical Goal(s):  Marland Kitchen Over the next 30 days, client will work with SW to address concerns related to getting patient placed into a skilled nursing facility.  Interventions: . Patient interviewed and appropriate assessments performed . Provided patient with information about LTC placement process . Discussed plans with patient for ongoing care management follow up and provided patient with direct contact information for care management team . Advised patient's family to stay in active communication with inpatient case managers, social workers and physicians at hospital. Inpatient team is still having trouble seeking short term nursing facility placement but have had one SNF agreeable to consider him for placement and will review his case.  . Assisted patient/caregiver with obtaining information about health plan benefits . Provided education to patient/caregiver regarding level of care options.  . Review of EMR showed patient was still hospitalized and will be getting vascular surgery today on 03/16/2019. Marland Kitchen Email sent to [facility] team members at [place] to alert them of patient's continued need for LTC placement.   Patient Self Care Activities:  . Currently UNABLE TO independently take care of self.  Please see past updates related to this goal by clicking on the "Past Updates" button in the selected goal      The care management team will reach out to the patient again over the next 30 days days.   Eula Fried, BSW, MSW, [email or web address] Phone: [phone]

## 2019-03-16 NOTE — Op Note (Signed)
OPERATIVE NOTE   PROCEDURE: 1. Left common femoral, profunda femoris, and superficial femoral artery endarterectomies 2.   Right common femoral, profunda femoris, and superficial femoral artery endarterectomies 3.   Catheter placement into aorta from bilateral femoral approach 4.   Bilateral external iliac stent placements with 8 mm diameter by 10 cm length via bond stents 5.   Bilateral common iliac stent placements with 8 mm diameter by 58 mm length lifestream stents 6.   4 stent placements to the aorta to realign both iliac arteries up to the base of both renal arteries using two 9 mm diameter by 58 mm length lifestream stents and two 10 mm diameter by 58 mm length lifestream stents   PRE-OPERATIVE DIAGNOSIS: 1.Atherosclerotic occlusive disease bilateral lower extremities with rest pain and ulceration   POST-OPERATIVE DIAGNOSIS: Same  SURGEON: Leotis Pain, MD  Assistant: Hezzie Bump, PA-C  ANESTHESIA: general  ESTIMATED BLOOD LOSS: 50 cc  FINDING(S): 1. significant plaque in bilateral common femoral, profunda femoris, and superficial femoral arteries 2.  Occlusion of both common and external iliac arteries and the aorta with dense calcific plaque  SPECIMEN(S): Bilateral common femoral, profunda femoris, and superficial femoral artery plaque.  INDICATIONS:  Patient presents with profound ischemia with aortoiliac occlusion and severe femoral disease.  He is a very poor surgical candidate for aortobifemoral bypass, but recanalization of his aorta and iliac arteries with stents as well as bilateral femoral endarterectomies are planned to try to improve perfusion. The risks and benefits as well as alternative therapies including intervention were reviewed in detail all questions were answered the patient agrees to proceed with surgery.  Consent was obtained from his mother as the patient was unable to provide consent prior to surgery.  DESCRIPTION: After obtaining full  informed written consent, the patient was brought back to the operating room and placed supine upon the operating table. The patient received IV antibiotics prior to induction. After obtaining adequate anesthesia, the patient was prepped and draped in the standard fashion appropriate time out is called.   We began by dissecting out the femoral arteries on each side. Vertical incisions were created overlying both femoral arteries. The common femoral artery proximally, and superficial femoral artery, and primary profunda femoris artery branches were encircled with vessel loops and prepared for control. Both femoral arteries were found to have significant plaque from the common femoral artery into the profunda and superficial femoral arteries.   4000 units of heparin was given and allowed circulate for 5 minutes.   We began first by using Seldinger needles and placing 5 French sheaths into both femoral arteries advancing the wires up into the iliac arteries.  Using Kumpe catheters and advantage wires I was able to cross the aortoiliac occlusion and confirm intraluminal flow in the supraceliac aorta first on the right side and then on the left side.  We then upsized to 8 [place] sheaths bilaterally.  After sheath exchange, we exchanged for 0.018 wires.  Self-expanding covered Viabahn stents were then deployed from the top of the femoral head sparing the circumflex vessels and taken up to the mid iliac artery encompassing the entirety of the external iliac arteries bilaterally.  These went up to the previously placed stents in the iliac arteries which were occluded.  The stents were 8 mm in diameter by 10 cm in length bilaterally and the external iliac arteries.  Kissing balloon stent placement was then performed after placing a 0.035 wire bilaterally in both common iliac arteries to the aortic  bifurcation to bridge down to the Viabahn stents.  8 mm diameter by 58 mm length lifestream stents were placed in both  common iliac arteries.  The aorta was then realigned essentially as a double lumen shotgun from the terminal aorta up to the base of the renal arteries bilaterally.  This was done with 9 mm diameter by 58 mm length lifestream stents up both sides in the distal aorta and then 10 mm diameter by 58 mm length lifestream stents up to the base of the renal arteries and the proximal infrarenal aorta.  Each of the lifestream stent inflations were somewhere between 10 and 12 atm to open the stents.  The iliac vessels were then postdilated with 8 mm diameter high-pressure angioplasty balloons inflated to 14 to 18 atm.  A pigtail catheter was then placed back into the aorta just above the visceral vessels and imaging was performed.  All 4 aortic stents were widely patent and all 4 iliac stents (2 on each side) were widely patent without focal stenosis although the common femoral disease provided essentially no outflow on initial imaging.  Retrograde imaging was performed through the sheath on each side to see the distal portions of the stent and all were patent with less than 10% stenosis.  Both renal arteries remained widely patent as well.  We then remove the sheaths and began with the endarterectomy portion of the procedure. Attention is then turned to the left femoral artery. An arteriotomy is made with 11 blade and extended with Potts scissors in the common femoral artery and carried down onto the first 1-2 cm of the superficial femoral artery. An endarterectomy was then performed. The [facility] was used to create a plane. The proximal endpoint was created with gentle traction up to the base of the previously placed Viabahn stents. This was in the proximal common femoral artery. An eversion endarterectomy was then performed for the first 2-3 cm of the profunda femoris artery. Good backbleeding was then seen. The distal endpoint of the superficial femoral endarterectomy was created with gentle traction and the  distal endpoint was relatively clean. The Cormatrix patcth is then selected and prepared for a patch angioplasty.  It is cut and beveled and started at the proximal endpoint with a 6-0 Prolene suture.  Approximately one half of the suture line is run medially and laterally and the distal end point was cut and bevelled to match the arteriotomy.  A second 6-0 Prolene was started at the distal end point and run to the mid portion to complete the arteriotomy.  The vessel was flushed prior to release of control and completion of the anastomosis.  At this point, flow was established first to the profunda femoris artery and then to the superficial femoral artery. Easily palpable pulses are noted beyond the anastomosis and both arteries.  The right femoral artery is then addressed. Arteriotomy is made in the common femoral artery and extended down into the superficial femoral artery. Similarly, an endarterectomy was performed with the [facility]. The proximal endpoint was created with gentle traction up to the distal edge of the previously placed Viabahn stent in the proximal common femoral artery.  The profunda femoris was addressed and treated with an eversion endarterectomy and this was performed with a hemostat and gentle traction.  There was still some disease in the more distal profunda femoris artery, but there appeared to be good backbleeding and flow.  The arteriotomy was carried down onto the superficial femoral artery and the  endarterectomy was continued to this point. The distal endpoint was created with gentle traction and was quite clean. The Cormatrix extracellular patch was then brought onto the field.  It is cut and beveled and started at the proximal endpoint with a 6-0 Prolene suture.  Approximately one half of the suture line is run medially and laterally and the distal end point was cut and bevelled to match the arteriotomy.  A second 6-0 Prolene was started at the distal end point and run to  the mid portion to complete the arteriotomy.   Flushing maneuvers were performed and flow was reestablished to the femoral vessels. Excellent pulses noted in the proximal right superficial femoral and profunda femoris artery below the femoral anastomosis.  Surgicel and Evicel topical hemostatic agents were placed in the femoral incisions and hemostasis was complete. The femoral incisions were then closed in a layered fashion with 2 layers of 2-0 Vicryl, 2 layers of 3-0 Vicryl, and 4-0 Monocryl for the skin closure.  Incisional vacs were placed bilaterally.  The patient was then awakened from anesthesia and taken to the recovery room in stable condition having tolerated the procedure well.  COMPLICATIONS: None  CONDITION: Stable     Leotis Pain 03/16/2019 3:36 PM  This note was created with Dragon Medical transcription system. Any errors in dictation are purely unintentional.

## 2019-03-16 NOTE — Progress Notes (Signed)
Palliative: Donald [name] is lying quietly in bed.  He is confused and mumbling, unintelligent speech. He is unable to make his basic needs known.  He will make but not keep eye contact. He appears acutely /chronically ill and frail.  There is no family at bedside at this time due to visitor restrictions.  [place] form (DNR) completed and placed on chart.     Conference with attending, RN CM related to condition, needs and disposition.   Call to mother, Donald [name], at [phone]. Updated on patient in OR for procedure. Donald Media shares that she understands that Donald [name] will be on ventilator until at least the day after the procedure.  We talk about selected labs and the treatment plan.   Plan:  Continue to treat the treatable, no CPR/intubation. Rehab if qualified.   18 minutes [name], NP Palliative Medicine Team Team Phone # [phone] Greater than 50% of this time was spent counseling and coordinating care related to the above assessment and plan.

## 2019-03-16 NOTE — Transfer of Care (Signed)
Immediate Anesthesia Transfer of Care Note  Patient: Donald [name].  Procedure(s) Performed: ENDARTERECTOMY FEMORAL (Bilateral ) AORTIC ILIAC STENT (Bilateral )  Patient Location: PACU  Anesthesia Type:General  Level of Consciousness: sedated  Airway & Oxygen Therapy: Patient Spontanous Breathing and Patient connected to face mask oxygen  Post-op Assessment: Report given to RN and Post -op Vital signs reviewed and stable  Post vital signs: Reviewed  Last Vitals:  Vitals Value Taken Time  BP 166/98 03/16/19 1554  Temp    Pulse 104 03/16/19 1554  Resp 23 03/16/19 1554  SpO2 100 % 03/16/19 1554  Vitals shown include unvalidated device data.  Last Pain:  Vitals:   03/16/19 0753  TempSrc: Oral  PainSc: 0-No pain         Complications: No apparent anesthesia complications

## 2019-03-16 NOTE — Plan of Care (Signed)
HR more controlled tonight currently in the 90's. NPO since midnight, heparin and NS infusing  Problem: Clinical Measurements: Goal: Respiratory complications will improve Outcome: Progressing Note: On room air   Problem: Coping: Goal: Level of anxiety will decrease Outcome: Progressing   Problem: Pain Managment: Goal: General experience of comfort will improve Outcome: Progressing Note: No complaints of pain   Problem: Safety: Goal: Ability to remain free from injury will improve Outcome: Progressing

## 2019-03-17 ENCOUNTER — Encounter: Payer: Self-pay | Admitting: Vascular Surgery

## 2019-03-17 DIAGNOSIS — Z960 Presence of urogenital implants: Secondary | ICD-10-CM

## 2019-03-17 DIAGNOSIS — R443 Hallucinations, unspecified: Secondary | ICD-10-CM

## 2019-03-17 LAB — BPAM RBC
Blood Product Expiration Date: 202008052359
Blood Product Expiration Date: 202008052359
Blood Product Expiration Date: 202008082359
Blood Product Expiration Date: 202008082359
ISSUE DATE / TIME: 202007082254
ISSUE DATE / TIME: 202007090152
Unit Type and Rh: 5100
Unit Type and Rh: 5100
Unit Type and Rh: 6200
Unit Type and Rh: 6200

## 2019-03-17 LAB — BASIC METABOLIC PANEL
Anion gap: 6 (ref 5–15)
BUN: 23 mg/dL — ABNORMAL HIGH (ref 6–20)
CO2: 20 mmol/L — ABNORMAL LOW (ref 22–32)
Calcium: 8.8 mg/dL — ABNORMAL LOW (ref 8.9–10.3)
Chloride: 125 mmol/L — ABNORMAL HIGH (ref 98–111)
Creatinine, Ser: 0.9 mg/dL (ref 0.61–1.24)
GFR calc Af Amer: >60 mL/min (ref >60)
GFR calc non Af Amer: >60 mL/min (ref >60)
Glucose, Bld: 114 mg/dL — ABNORMAL HIGH (ref 70–99)
Potassium: 3.7 mmol/L (ref 3.5–5.1)
Sodium: 151 mmol/L — ABNORMAL HIGH (ref 135–145)

## 2019-03-17 LAB — TYPE AND SCREEN
ABO/RH(D): A POS
Antibody Screen: NEGATIVE
PT AG Type: NEGATIVE
Unit division: 0
Unit division: 0
Unit division: 0
Unit division: 0

## 2019-03-17 LAB — CBC
HCT: 33.7 % — ABNORMAL LOW (ref 39.0–52.0)
Hemoglobin: 11 g/dL — ABNORMAL LOW (ref 13.0–17.0)
MCH: 34 pg (ref 26.0–34.0)
MCHC: 32.6 g/dL (ref 30.0–36.0)
MCV: 104 fL — ABNORMAL HIGH (ref 80.0–100.0)
Platelets: 250 10*3/uL (ref 150–400)
RBC: 3.24 MIL/uL — ABNORMAL LOW (ref 4.22–5.81)
RDW: 14.3 % (ref 11.5–15.5)
WBC: 23.2 10*3/uL — ABNORMAL HIGH (ref 4.0–10.5)
nRBC: 0 % (ref 0.0–0.2)

## 2019-03-17 LAB — PREPARE RBC (CROSSMATCH)

## 2019-03-17 LAB — PROCALCITONIN: Procalcitonin: 0.34 ng/mL

## 2019-03-17 LAB — HEPARIN LEVEL (UNFRACTIONATED)
Heparin Unfractionated: 0.38 IU/mL (ref 0.30–0.70)
Heparin Unfractionated: <0.1 IU/mL — ABNORMAL LOW (ref 0.30–0.70)

## 2019-03-17 MED ORDER — HALOPERIDOL LACTATE 5 MG/ML IJ SOLN
2.0000 mg | Freq: Four times a day (QID) | INTRAMUSCULAR | Status: DC | PRN
  Administered 2019-03-17 – 2019-03-20 (×2): 2 mg via INTRAVENOUS
  Filled 2019-03-17 (×2): qty 1

## 2019-03-17 MED ORDER — ZIPRASIDONE MESYLATE 20 MG IM SOLR
10.0000 mg | Freq: Once | INTRAMUSCULAR | Status: DC
  Filled 2019-03-17: qty 20

## 2019-03-17 MED ORDER — VANCOMYCIN HCL 500 MG IV SOLR
500.0000 mg | Freq: Two times a day (BID) | INTRAVENOUS | Status: DC
  Administered 2019-03-17 – 2019-03-18 (×2): 500 mg via INTRAVENOUS
  Filled 2019-03-17 (×4): qty 500

## 2019-03-17 NOTE — Progress Notes (Signed)
At 1730 found Donald [name] in bloody sheets.  He had pulled out is right anterior arm IV and was trying to pull out his foley.  He had on his mittens at the time.  He was angry and aggressive if we tried to touch him.  Cleaned him up the best we could without "touching him" or by having someone hold his arms still while we worked. Gave him 2 mg IV of Haldol.  No improvement in his behavior until change of shift rounds.  He is, again,calm and cooperative,.

## 2019-03-17 NOTE — Progress Notes (Signed)
[facility] Daily Progress Note   Subjective: 1 Day Post-Op: 1. Left common femoral, profunda femoris, and superficial femoral artery endarterectomies 2.   Right common femoral, profunda femoris, and superficial femoral artery endarterectomies 3.   Catheter placement into aorta from bilateral femoral approach 4.   Bilateral external iliac stent placements with 8 mm diameter by 10 cm length via bond stents 5.   Bilateral common iliac stent placements with 8 mm diameter by 58 mm length lifestream stents 6.   4 stent placements to the aorta to realign both iliac arteries up to the base of both renal arteries using two 9 mm diameter by 58 mm length lifestream stents and two 10 mm diameter by 58 mm length lifestream stents  Patient laying in bed without distress. Continues to ask for mittens to be taken off. No issues overnight.   Objective: Vitals:   03/17/19 0412 03/17/19 0630 03/17/19 0731 03/17/19 0811  BP: (!) 142/89 (!) 146/91  (!) 152/95  Pulse: (!) 126 (!) 121  (!) 109  Resp: 20   18  Temp: 97.6 F (36.4 C)   97.6 F (36.4 C)  TempSrc: Oral   Oral  SpO2: 93%  94% 100%  Weight:      Height:        Intake/Output Summary (Last 24 hours) at 03/17/2019 1343 Last data filed at 03/17/2019 8469 Gross per 24 hour  Intake 2165.76 ml  Output 1215 ml  Net 950.76 ml   Physical Exam: A&Ox3, NAD CV: RRR Pulmonary: CTA Bilaterally Abdomen: Soft, Non-tender, Non-distended, (+) Bowel Sounds Right Groin: VAC in place to suction. Minimal drainage. No swelling / ecchymosis. Left Groin: VAC in place to suction. Minimal drainage. No swelling / ecchymosis. Vascular:  Right Lower Extremity: Thigh soft, calf soft. Extremity warm distally to toes. Some discoloration to the toes.  Left Lower Extremity: Thigh soft, calf soft. Extremity warm distally to toes. Some discoloration to the toes.   Laboratory: CBC    Component Value Date/Time   WBC 23.2 (H) 03/17/2019 0344   HGB 11.0 (L) 03/17/2019 0344   HGB 14.9 07/31/2015 1117   HCT 33.7 (L) 03/17/2019 0344   HCT 42.5 07/31/2015 1117   PLT 250 03/17/2019 0344   PLT 161 07/31/2015 1117   BMET    Component Value Date/Time   NA 151 (H) 03/17/2019 0344   NA 125 (L) 02/07/2016 1305   NA 130 (L) 11/20/2011 0510   K 3.7 03/17/2019 0344   K 4.0 11/20/2011 0510   CL 125 (H) 03/17/2019 0344   CL 98 11/20/2011 0510   CO2 20 (L) 03/17/2019 0344   CO2 18 (L) 11/20/2011 0510   GLUCOSE 114 (H) 03/17/2019 0344   GLUCOSE 82 11/20/2011 0510   BUN 23 (H) 03/17/2019 0344   BUN 5 (L) 02/07/2016 1305   BUN 1 (L) 11/20/2011 0510   CREATININE 0.90 03/17/2019 0344   CREATININE 0.93 01/05/2019 0800   CALCIUM 8.8 (L) 03/17/2019 0344   CALCIUM 8.5 11/20/2011 0510   GFRNONAA >60 03/17/2019 0344   GFRNONAA 92 01/05/2019 0800   GFRAA >60 03/17/2019 0344   GFRAA 107 01/05/2019 0800   Assessment/Planning: The patient is a 58 year old male with bilateral lower extremity PAD s/p bilateral femoral endarterectomy with stent placement POD #1 1) One gram drop in Hbg - CBC in AM 2) Will remove VAC dressings on Monday 3) Clinically doing well, improvement in physical exam.  4) PT / OT 5)  I would think dispo to SNF -pending PT recommendations  Discussed with Dr. Ellis [facility] PA-C 03/17/2019 1:43 PM

## 2019-03-17 NOTE — Progress Notes (Signed)
ID More alert but still hallucinating Patient Vitals for the past 24 hrs:  BP Temp Temp src Pulse Resp SpO2  03/17/19 1551 (!) 155/93 97.6 F (36.4 C) Oral (!) 110 18 99 %  03/17/19 1530 - - - (!) 149 - -  03/17/19 0811 (!) 152/95 97.6 F (36.4 C) Oral (!) 109 18 100 %  03/17/19 0731 - - - - - 94 %  03/17/19 0630 (!) 146/91 - - (!) 121 - -  03/17/19 0412 (!) 142/89 97.6 F (36.4 C) Oral (!) 126 20 93 %  03/17/19 0032 - - - - - 93 %  03/17/19 0029 - - - 61 - (!) 87 %  03/17/19 0020 136/82 - - (!) 116 - -  03/17/19 0007 - - - - 20 -  03/16/19 2200 - - - - (!) 22 -  03/16/19 2010 (!) 139/95 98.5 F (36.9 C) Oral (!) 130 - 96 %  03/16/19 1951 - - - - - 95 %   B/l wound vac groin Foley Chest b/l air entry  CBC Latest Ref Rng & Units 03/17/2019 03/16/2019 03/15/2019  WBC 4.0 - 10.5 K/uL 23.2(H) 26.2(H) 26.4(H)  Hemoglobin 13.0 - 17.0 g/dL 11.0(L) 12.2(L) 13.0  Hematocrit 39.0 - 52.0 % 33.7(L) 37.5(L) 38.2(L)  Platelets 150 - 400 K/uL 250 255 277   ? Impression/Recommendation ? ?58 year old male with history of COPD, EtOH abuse, recent hospitalization for DVT, peripheral arterial disease presents with inability to walk with discoloration of the toes and and for hallucination  Severe leukocytosis likely related to the ischemia of the toes with peripheral arterial disease.  I doubt this is due to urinary tract infection with staph epidermidis. Patient has undergone extensive endarterectomies on bilateral lower extremities arteries.  He does have a wound VAC on both groins.  currently on vancomycin and cefepime.  Staph epidermidis in the urinary culture is a contaminant from the skin.  Penile erosions.  Likely due to the condom cath use in the previous hospitalization but need to rule out herpetic ulceration.   sent aerobic culture as well as HSV.  He currently has a Foley catheter in place.  Stage II sacral decub as told by his nurse.  Failure to thrive   Delirium,  hallucination could be due to the alcohol use also need to rule out Wernicke's encephalopathy or Korsakoff psychosis. HIV nonreactive in June. We will also check for RPR____

## 2019-03-17 NOTE — Progress Notes (Signed)
OT Cancellation Note  Patient Details Name: Donald [name]. MRN: [id number] DOB: [birth year]   Cancelled Treatment:    Reason Eval/Treat Not Completed: Other (comment). Consult received, chart reviewed. Spoke with PT who recently evaluated pt. Per PT, pt confused, pulling at lines, mittens on, requiring heavy cueing to redirect to task. Will hold OT evaluation this date and re-attempt next date to support improved cognition and participation in session, as medically appropriate.   Jeni Salles, MPH, MS, OTR/L ascom [phone] 03/17/19, 4:12 PM

## 2019-03-17 NOTE — Consult Note (Signed)
ANTICOAGULATION CONSULT NOTE   Pharmacy Consult for Heparin Indication: aortic occlusion  No Known Allergies  Patient Measurements: Height: 5\' 9"  (175.3 cm) Weight: 108 lb 7.5 oz (49.2 kg) IBW/kg (Calculated) : 70.7 Heparin Dosing Weight: 49.2  Vital Signs: Temp: 97.5 F (36.4 C) (07/10 2006) Temp Source: Oral (07/10 2006) BP: 143/88 (07/10 2006) Pulse Rate: 112 (07/10 2006)  Labs: Recent Labs    03/14/19 2334  03/15/19 0111  03/16/19 0557 03/17/19 0344 03/17/19 1340 03/17/19 2002  HGB  --    < > 13.0  --  12.2* 11.0*  --   --   HCT  --   --  38.2*  --  37.5* 33.7*  --   --   PLT  --   --  277  --  255 250  --   --   APTT  --   --  68*  --  75*  --   --   --   LABPROT  --   --  16.2*  --  15.9*  --   --   --   INR  --   --  1.3*  --  1.3*  --   --   --   HEPARINUNFRC  --   --  0.37   < > 0.40  --  0.38 <0.10*  CREATININE 0.86  --   --   --  0.83 0.90  --   --    < > = values in this interval not displayed.    Estimated Creatinine Clearance: 64.5 mL/min (by C-G formula based on SCr of 0.9 mg/dL).   Assessment: Pharmacy consulted for heparin. No DOAC PTA. When clinically appropriate, patient will transition to oral when clinically appropriate.   7/6 Heparin infusion started @ 750 units/hr  7/6 @ 2315 HL: 0.14. Level subtherapeutic.  Infusion increased to 900 units/hr.  7/7 @ 0503 HL: 0.47. Level is therapeutic x 1- heparin infusion rate of  900 units/hr. 7/7 @ 1229 HL: 0.26. Level is subtherapeutic. 700 unit bolus and rate increased to 950 units/hr.  7/7 @ 1857 HL: 0.53. Level is therapeutic.  7/8 @ 0111 HL: 0.37, aPTT: 68, Both levels are therapeutic. 7/8 @ 0756 HL: 0.38.  Level is therapeutic x 2.  7/9 @ 0557 aPTT: 75, HL: 0.40. Both levels are therapeutic and correlating.  7/10 @1340  HL: 0.38. Therapeutic s/p resuming dose after being stopped at 1623 yesterday and restarted today at 0700.   Goal of Therapy:  Heparin level 0.3-0.7 units/ml Monitor platelets  by anticoagulation protocol: Yes   Plan:  HL undetectable. Heparin drip stopped at 1730 due to line issue based on chart review.  Night shift RN states she restarted heparin drip about 5 minutes ago but that she needs to scan it. Will reorder HL for 0330. CBC in AM.    Rocky Morel, PharmD Clinical Pharmacist 03/17/2019 9:22 PM

## 2019-03-17 NOTE — Consult Note (Signed)
[facility] for Heparin Indication: aortic occlusion  No Known Allergies  Patient Measurements: Height: 5\' 9"  (175.3 cm) Weight: 108 lb 7.5 oz (49.2 kg) IBW/kg (Calculated) : 70.7 Heparin Dosing Weight: 49.2  Vital Signs: Temp: 97.6 F (36.4 C) (07/10 0811) Temp Source: Oral (07/10 0811) BP: 152/95 (07/10 0811) Pulse Rate: 109 (07/10 0811)  Labs: Recent Labs    03/14/19 2334  03/15/19 0111 03/15/19 0756 03/16/19 0557 03/17/19 0344  HGB  --    < > 13.0  --  12.2* 11.0*  HCT  --   --  38.2*  --  37.5* 33.7*  PLT  --   --  277  --  255 250  APTT  --   --  68*  --  75*  --   LABPROT  --   --  16.2*  --  15.9*  --   INR  --   --  1.3*  --  1.3*  --   HEPARINUNFRC  --   --  0.37 0.38 0.40  --   CREATININE 0.86  --   --   --  0.83 0.90   < > = values in this interval not displayed.    Estimated Creatinine Clearance: 64.5 mL/min (by C-G formula based on SCr of 0.9 mg/dL).   Medical History: Past Medical History:  Diagnosis Date  . Depression   . Emphysema of lung ([place])   . Essential hypertension   . History of chicken pox   . Low weight   . Neuropathy    bilateral lower legs  . Peripheral vascular disease ([place])   . Prostate disease   . Seasonal allergies   . Seizures ([place])   . Wears dentures    full upper    Medications:  Medications Prior to Admission  Medication Sig Dispense Refill Last Dose  . acetaminophen (TYLENOL) 500 MG tablet Take 500 mg by mouth every 6 (six) hours as needed.   03/11/2019  . albuterol (VENTOLIN HFA) 108 (90 Base) MCG/ACT inhaler Inhale 2 puffs into the lungs every 6 (six) hours as needed for wheezing or shortness of breath. 8.5 g 2 Unknown at PRN  . amLODipine (NORVASC) 10 MG tablet Take 1 tablet (10 mg total) by mouth daily. 30 tablet 0 03/12/2019 at 1000  . aspirin 81 MG tablet Take 1 tablet (81 mg total) by mouth daily. 30 tablet 11 03/08/2019 at Unknown time  . cloNIDine (CATAPRES) 0.1 MG tablet  Take 1 tablet (0.1 mg total) by mouth 2 (two) times daily. 60 tablet 0 03/08/2019 at Unknown time  . feeding supplement, ENSURE ENLIVE, (ENSURE ENLIVE) LIQD Take 237 mLs by mouth 2 (two) times daily between meals. 14220 mL 0 03/11/2019  . Fluticasone-Salmeterol (ADVAIR) 250-50 MCG/DOSE AEPB Inhale 1 puff into the lungs 2 (two) times daily. 60 each 11 03/08/2019 at Unknown time  . megestrol (MEGACE) 40 MG tablet Take 2-4 tablets (80-160 mg total) by mouth daily. Start with 2 tablets a day, then gradually increase as tolerated up to 4 per day 120 tablet 0 Past Week at Unknown time  . metoprolol succinate (TOPROL-XL) 50 MG 24 hr tablet Take 1 tablet (50 mg total) by mouth daily. 90 tablet 1 03/08/2019 at Unknown time  . tamsulosin (FLOMAX) 0.4 MG CAPS capsule Take 1 capsule (0.4 mg total) by mouth daily after supper. 90 capsule 3 03/08/2019 at Unknown time  . chlordiazePOXIDE (LIBRIUM) 25 MG capsule 25 mg p.o. twice daily  x2 days Then 25 mg p.o. daily x2 days (Patient not taking: Reported on 03/09/2019) 6 capsule 0 03/07/2019 at Unknown time   Scheduled:  . amLODipine  10 mg Oral Daily  . aspirin EC  81 mg Oral Daily  . Chlorhexidine Gluconate Cloth  6 each Topical Q0600  . cholecalciferol  1,000 Units Oral Daily  . cloNIDine  0.1 mg Oral BID  . feeding supplement (ENSURE ENLIVE)  237 mL Oral TID BM  . folic acid  1 mg Oral Daily  . Gerhardt's butt cream   Topical TID  . ipratropium-albuterol  3 mL Nebulization BID  . megestrol  200 mg Oral BID  . metoprolol succinate  50 mg Oral Daily  . multivitamin with minerals  1 tablet Oral Daily  . mupirocin ointment  1 application Nasal BID  . potassium chloride  40 mEq Oral BID  . tamsulosin  0.4 mg Oral QPC supper  . [START ON 03/20/2019] thiamine  100 mg Oral Daily  . vitamin C  250 mg Oral BID   Infusions:  . sodium chloride 75 mL/hr at 03/16/19 2114  . ceFEPime (MAXIPIME) IV Stopped (03/16/19 2250)  . heparin 950 Units/hr (03/17/19 0703)  . thiamine  injection Stopped (03/17/19 0114)  . vancomycin 750 mg (03/17/19 0755)   PRN: hydrALAZINE, labetalol, metoprolol tartrate, ondansetron (ZOFRAN) IV, sodium chloride flush  Assessment: Pharmacy consulted for heparin. No DOAC PTA. When clinically appropriate, patient will transition to oral when clinically appropriate.   7/6 Heparin infusion started @ 750 units/hr  7/6 @ 2315 HL: 0.14. Level subtherapeutic.  Infusion increased to 900 units/hr.  7/7 @ 0503 HL: 0.47. Level is therapeutic x 1- heparin infusion rate of  900 units/hr. 7/7 @ 1229 HL: 0.26. Level is subtherapeutic. 700 unit bolus and rate increased to 950 units/hr.  7/7 @ 1857 HL: 0.53. Level is therapeutic.  7/8 @ 0111 HL: 0.37, aPTT: 68, Both levels are therapeutic. 7/8 @ 0756 HL: 0.38.  Level is therapeutic x 2.  7/9 @ 0557 aPTT: 75, HL: 0.40. Both levels are therapeutic and correlating.  7/10 @1340  HL: 0.38. Therapeutic s/p resuming dose after being stopped at 1623 yesterday and restarted today at 0700.   Goal of Therapy:  Heparin level 0.3-0.7 units/ml Monitor platelets by anticoagulation protocol: Yes   Plan:  Continue current heparin infusion rate at 950 units/hr. Recheck in 6 hours for confirmatory HL.  Continue to check anti-Xa levels & CBC daily while on heparin Continue to monitor H&H and platelets  Rowland Lathe, PharmD Clinical Pharmacist 03/17/2019 9:50 AM

## 2019-03-17 NOTE — Consult Note (Signed)
Pharmacy Antibiotic Note  Donald [name]. is a 58 y.o. male admitted on 03/13/2019 with leukocytosis, penis wound.  Pharmacy has been consulted for vancomycin dosing. Also on cefepime for wound infection.   Plan:  Change vancomycin to 500 mg q12H. Predicted AUC 487. Goal AUC 400-550. Plan to order levels in 4-5 days. Monitor renal function. Last vancomycin dose given 7/8 @0813 .   Height: 5\' 9"  (175.3 cm) Weight: 108 lb 7.5 oz (49.2 kg) IBW/kg (Calculated) : 70.7  Temp (24hrs), Avg:97.7 F (36.5 C), Min:96.9 F (36.1 C), Max:98.5 F (36.9 C)  Recent Labs  Lab 03/13/19 0931 03/13/19 1039 03/13/19 1207 03/14/19 0501 03/14/19 0847 03/14/19 2334 03/15/19 0111 03/16/19 0557 03/17/19 0344  WBC 21.5*  --   --  22.0*  --   --  26.4* 26.2* 23.2*  CREATININE 0.72  --   --  0.81  --  0.86  --  0.83 0.90  LATICACIDVEN  --  2.3* 2.3*  --  1.6  --   --   --   --     Estimated Creatinine Clearance: 64.5 mL/min (by C-G formula based on SCr of 0.9 mg/dL).    No Known Allergies  Antimicrobials this admission: 7/6 cefepime >>  7/9 vancomcyin >>   Dose adjustments this admission: None  Microbiology results: 7/6 BCx: pending 7/6 UCx:  STAPHYLOCOCCUS EPIDERMIDIS 7/6 MRSA PCR: positive   Thank you for allowing pharmacy to be a part of this patient's care.  Rowland Lathe, PharmD Clinical Pharmacist  03/17/2019 9:52 AM

## 2019-03-17 NOTE — Evaluation (Signed)
Physical Therapy Evaluation Patient Details Name: Donald [name]. MRN: [id number] DOB: Aug 19, 1961 Today's Date: 03/17/2019   History of Present Illness  Donald [name] is a 37yoM who comes to [facility] on 7/6 c continued BLE pain and AMS, found to be septic 2/2 UTI. PMH: PAD, ETOH, COPD, HTN, BPH. Pt underwent PTA in BLE/iliac AA on 7/6, and on 7/9 BLE endarterectomies (femoral/iliac) and stent placement (iliac and aortic). Pt was recently admitted 6/27-7/1 after fall/LOC and with ETOH withdrawl.  Clinical Impression  Pt admitted with above diagnosis. Pt currently with functional limitations due to the deficits listed below (see "PT Problem List"). Upon entry, pt in bed, awake and agreeable to participate. Pt familiar to author from evaluation 10DA at prior admission. The pt is alert and oriented to self, pleasant, conversational, but largely hyperverbal, mumbling and tangential, although he speaks more slowly/clearly when cued. Of note: this date Na+: 151, pt HR 124-125 prior to entry, which increases to 135 seated, and after attempted transfer HR: 149bpm at which point session is terminated. Pt requires maxA for bed mobility and transfers, is not medically safe to attempt AMB, but last week when patient was in better condition, only tolerated <22ft AMB with PT. Functional mobility assessment demonstrates increased effort/time requirements, poor tolerance, and need for physical assistance, whereas the patient performed these at a higher level of independence PTA. Pt is heavily deconditioned and weak, unclear that his elderly mother could provide the requisite physical assistance at DC. Pt also remains confused and requires redirection frequently throughout session for safety reasons. Pt will benefit from skilled PT intervention to increase independence and safety with basic mobility in preparation for discharge to the venue listed below.       Follow Up Recommendations Supervision for  mobility/OOB;SNF;Supervision/Assistance - 24 hour    Equipment Recommendations  Other (comment)(to be determined by facility.)    Recommendations for Other Services       Precautions / Restrictions Precautions Precautions: Fall Precaution Comments: *confused, picking at lines, mittens donned. Restrictions Weight Bearing Restrictions: No      Mobility  Bed Mobility Overal bed mobility: Needs Assistance Bed Mobility: Supine to Sit;Sit to Supine     Supine to sit: Mod assist Sit to supine: Total assist   General bed mobility comments: able to perform both at minGuard assist 10DA, but this date is much more weak; seated EOB x 8 minutes, HR remains ~135bpm sustained, NSR  Transfers Overall transfer level: Needs assistance Equipment used: 1 person hand held assist Transfers: Sit to/from Stand Sit to Stand: Max assist         General transfer comment: 2WA performed with max effort and RW at minGuard, but this date requires maxA with severe leg weakness. Thereafter HR sustained at 149bpm, session ended and patient returned to supine.  Ambulation/Gait Ambulation/Gait assistance: (not safe/appropriate to perform at this time.)              Stairs            Wheelchair Mobility    Modified Rankin (Stroke Patients Only)       Balance Overall balance assessment: Needs assistance Sitting-balance support: Single extremity supported;Feet supported Sitting balance-Leahy Scale: Poor     Standing balance support: During functional activity Standing balance-Leahy Scale: Zero                               Pertinent Vitals/Pain Pain Assessment: (reports pain  in LLE, but mentation makes report difficulty to ascertain)    Home Living Family/patient expects to be discharged to:: Private residence Living Arrangements: Alone(recently DC to home with mother after admission last week.)             Home Equipment: Gilford Rile - 2 wheels;Cane - single  point      Prior Function Level of Independence: Needs assistance   Gait / Transfers Assistance Needed: household distances with intermittent SPC use, chronic BLE fatigue from vascular claudication issues s/p stent placement x7  ADL's / Homemaking Assistance Needed: reportedly independent, however pt arrives soiled.        Hand Dominance        Extremity/Trunk Assessment        Lower Extremity Assessment Lower Extremity Assessment: (BLE maintained in fetal position, pt not tolerating limb extension without pain.)    Cervical / Trunk Assessment Cervical / Trunk Assessment: Kyphotic  Communication   Communication: No difficulties(Lots of continuous mumbling, tangential speech.)  Cognition Arousal/Alertness: Awake/alert Behavior During Therapy: Restless;Impulsive Overall Cognitive Status: Impaired/Different from baseline Area of Impairment: Memory;Safety/judgement;Awareness;Problem solving;Attention;Orientation                 Orientation Level: Person Current Attention Level: Selective;Alternating;Divided Memory: Decreased recall of precautions;Decreased short-term memory   Safety/Judgement: Decreased awareness of safety;Decreased awareness of deficits   Problem Solving: Requires verbal cues;Requires tactile cues General Comments: more confused compared to eval 10DA      General Comments      Exercises     Assessment/Plan    PT Assessment Patient needs continued PT services  PT Problem List Decreased strength;Decreased balance;Decreased activity tolerance;Decreased mobility;Decreased safety awareness;Decreased knowledge of precautions       PT Treatment Interventions Balance training;Gait training;Stair training;Functional mobility training;Therapeutic activities;Therapeutic exercise;Patient/family education    PT Goals (Current goals can be found in the Care Plan section)  Acute Rehab PT Goals PT Goal Formulation: Patient unable to participate in  goal setting Time For Goal Achievement: 03/31/19    Frequency Min 2X/week   Barriers to discharge Inaccessible home environment;Decreased caregiver support      Co-evaluation               AM-PAC PT "6 Clicks" Mobility  Outcome Measure Help needed turning from your back to your side while in a flat bed without using bedrails?: Total Help needed moving from lying on your back to sitting on the side of a flat bed without using bedrails?: Total Help needed moving to and from a bed to a chair (including a wheelchair)?: Total Help needed standing up from a chair using your arms (e.g., wheelchair or bedside chair)?: Total Help needed to walk in hospital room?: Total Help needed climbing 3-5 steps with a railing? : Total 6 Click Score: 6    End of Session   Activity Tolerance: No increased pain;Treatment limited secondary to medical complications (Comment);Patient tolerated treatment well(HR elevated in the setting of HyperNa+: 151) Patient left: in bed;with bed alarm set;with call bell/phone within reach;Other (comment) Nurse Communication: Mobility status(encourage patient to drink water and assist periodically as patient has mittens donned.) PT Visit Diagnosis: Unsteadiness on feet (R26.81);Other abnormalities of gait and mobility (R26.89);Muscle weakness (generalized) (M62.81);Difficulty in walking, not elsewhere classified (R26.2)    Time: 0254-2706 PT Time Calculation (min) (ACUTE ONLY): 25 min   Charges:   PT Evaluation $PT Eval Moderate Complexity: 1 Mod PT Treatments $Therapeutic Exercise: 8-22 mins        4:12 PM,  03/17/19 Etta Grandchild, PT, DPT Physical Therapist - [facility]  [phone] ([place])   Campbell C 03/17/2019, 4:04 PM

## 2019-03-17 NOTE — Progress Notes (Signed)
[facility] NAME: Mathayus Stanbery    MR#:  [id number]  DATE OF BIRTH:  Oct 31, 1960  SUBJECTIVE:  CHIEF COMPLAINT:   Chief Complaint  Patient presents with  . Altered Mental Status   Patient trying to remove mittens and asking if I can take them off. He thinks he is having a hernia repair tomorrow and is Alert and oriented to self only. He denies any pain or trouble breathing.  REVIEW OF SYSTEMS:  Review of Systems  Respiratory: Negative for shortness of breath.   Cardiovascular: Negative for chest pain.    DRUG ALLERGIES:  No Known Allergies VITALS:  Blood pressure (!) 146/91, pulse (!) 121, temperature 97.6 F (36.4 C), temperature source Oral, resp. rate 20, height 5\' 9"  (1.753 m), weight 49.2 kg, SpO2 94 %. PHYSICAL EXAMINATION:  Physical Exam Vitals signs reviewed.  Constitutional:      General: He is not in acute distress.    Appearance: He is ill-appearing. He is not toxic-appearing.     Comments: malnourished  HENT:     Head: Normocephalic and atraumatic.  Cardiovascular:     Rate and Rhythm: Normal rate and regular rhythm.     Comments: Some more color to BLLE with purple discoloration still present along toes Pulmonary:     Effort: Pulmonary effort is normal.     Breath sounds: Normal breath sounds.  Abdominal:     General: Abdomen is flat.     Palpations: Abdomen is soft.  Neurological:     General: No focal deficit present.     Comments: A&O to self only    LABORATORY PANEL:  Male CBC Recent Labs  Lab 03/17/19 0344  WBC 23.2*  HGB 11.0*  HCT 33.7*  PLT 250   ------------------------------------------------------------------------------------------------------------------ Chemistries  Recent Labs  Lab 03/13/19 0931  03/16/19 0557 03/17/19 0344  NA 146*   < > 149* 151*  K 3.1*   < > 3.6 3.7  CL 110   < > 118* 125*  CO2 21*   < > 21* 20*  GLUCOSE 140*   < > 94 114*  BUN 18   < > 17 23*   CREATININE 0.72   < > 0.83 0.90  CALCIUM 10.5*   < > 9.3 8.8*  MG  --    < > 2.0  --   AST 86*  --   --   --   ALT 29  --   --   --   ALKPHOS 87  --   --   --   BILITOT 1.8*  --   --   --    < > = values in this interval not displayed.   RADIOLOGY:  Dg Chest Port 1 View  Result Date: 03/16/2019 CLINICAL DATA:  Leukocytosis. Ex-smoker. History of hypertension and emphysema. EXAM: PORTABLE CHEST 1 VIEW COMPARISON:  03/13/2019. FINDINGS: Normal sized heart. Clear lungs with stable biapical bullous changes. The lungs remain mildly hyperexpanded. Diffuse osteopenia. IMPRESSION: No acute abnormality. Stable mild changes of COPD. Electronically Signed   By: Claudie Revering M.D.   On: 03/16/2019 09:57   Vascular C-arm Images Only ([facility])  Result Date: 03/16/2019 Please refer to notes tab for details about interventional procedure. (Op Note)  ASSESSMENT AND PLAN:  JERNARD REIBER Jr.is a 58yo male with a PMH of PAD, h/o ETOH use disorder, COPD, HTN, BPH, h/o tobacco use disorder, h/o seizure, and protein-calorie malnutrition who  presented with worsening AMS found to be septic.  1.Urosepsis, sepsis improved; Leukocytosis: pt remains afebrile - ID following, appreciate recommednations and patient restarted on cefepime and vanc - Leukocytosis believed to be 2/2 PAD and ischemia, improved after procedure; WBCs (26.4>23.2) - CXR with no signs of acute process, procalcitonin 0.53>0.34  -Blood cultures, NGx4d - Urine culturewith staphepidermidis, which is likely contaminate per ID - MRSA PCRpositive - IVFs and PRN bolus to keep MAP >60 per sepsis protocol - palliative care following, patient now DNR  2.Altered mental status- unable to obtain hx - Monitor neurological status - may need to r/o Wernickes or Korasakoff's given continued AMS  - obtain RPR  3.PAD with ischemic feet BL s/p PCI 03/13/19, for revascularization on 03/16/2019 - Vascular surgeryfollowing-patient to go forsurgery  for revascularization with possible femoral endarterectomy/extensive aortoiliac stenting -continueheparin gttper vascular sx  4. ETOH abuse - Recent hospitalizationforDTs -Noactivesigns of withdrawal, CIWA monitoring - Check daily BMP+Mg- wnl 7/9 - Daily Folate, MVI;continue high-dose IV thiamine x3 days - PT/OT evaluation for mobility  5. Penile/ sacral (stage II)/ scrotal decubiti ulcerss/p revascularization  - woundcare consulted -Foley catheter in place to help keep the area dry - Likely due to the condom cath use in the previous hospitalization - ID has sent aerobic culture as well as HSV of ulcers  6. Hypokalemia Hypomagnesemia -Monitor K and mag, repleteas indicated  7.HTN-Goal BP <140/90 -OnClonidine, metoprolol and amlodipine - IV labetalol PRN  8.Severe, protein calorie malnutrition- likely 2/2 ETOH use disorder - nutritionfollowing  All the records are reviewed and case discussed with Care Management/Social Worker. Management plans discussed with the patient, family and they are in agreement.  CODE STATUS: DNR  POSSIBLE D/C IN 2-3 DAYS, DEPENDING ON CLINICAL CONDITION.  Martinique Padraig Nhan, DO PGY-3, [name]  03/17/2019 at 7:38 AM  Between 7am to 6pm - Pager - [phone]  After 6pm go to [email or web address] - Proofreader  [facility]  Office  [phone]  CC: Primary care physician; Olin Hauser, DO

## 2019-03-18 LAB — HEPARIN LEVEL (UNFRACTIONATED)
Heparin Unfractionated: 0.39 IU/mL (ref 0.30–0.70)
Heparin Unfractionated: 0.27 IU/mL — ABNORMAL LOW (ref 0.30–0.70)
Heparin Unfractionated: 0.47 IU/mL (ref 0.30–0.70)
Heparin Unfractionated: 0.59 IU/mL (ref 0.30–0.70)

## 2019-03-18 LAB — CBC
HCT: 28.4 % — ABNORMAL LOW (ref 39.0–52.0)
Hemoglobin: 9.2 g/dL — ABNORMAL LOW (ref 13.0–17.0)
MCH: 33.8 pg (ref 26.0–34.0)
MCHC: 32.4 g/dL (ref 30.0–36.0)
MCV: 104.4 fL — ABNORMAL HIGH (ref 80.0–100.0)
Platelets: 243 10*3/uL (ref 150–400)
RBC: 2.72 MIL/uL — ABNORMAL LOW (ref 4.22–5.81)
RDW: 14.4 % (ref 11.5–15.5)
WBC: 23 10*3/uL — ABNORMAL HIGH (ref 4.0–10.5)
nRBC: 0 % (ref 0.0–0.2)

## 2019-03-18 LAB — CULTURE, BLOOD (ROUTINE X 2)
Culture: NO GROWTH
Culture: NO GROWTH
Special Requests: ADEQUATE

## 2019-03-18 LAB — BASIC METABOLIC PANEL
Anion gap: 7 (ref 5–15)
BUN: 23 mg/dL — ABNORMAL HIGH (ref 6–20)
CO2: 19 mmol/L — ABNORMAL LOW (ref 22–32)
Calcium: 8.7 mg/dL — ABNORMAL LOW (ref 8.9–10.3)
Chloride: 120 mmol/L — ABNORMAL HIGH (ref 98–111)
Creatinine, Ser: 0.89 mg/dL (ref 0.61–1.24)
GFR calc Af Amer: >60 mL/min (ref >60)
GFR calc non Af Amer: >60 mL/min (ref >60)
Glucose, Bld: 89 mg/dL (ref 70–99)
Potassium: 3.9 mmol/L (ref 3.5–5.1)
Sodium: 146 mmol/L — ABNORMAL HIGH (ref 135–145)

## 2019-03-18 LAB — HEPATITIS PANEL, ACUTE
HCV Ab: >11 s/co ratio — ABNORMAL HIGH (ref 0.0–0.9)
Hep A IgM: NEGATIVE
Hep B C IgM: NEGATIVE
Hepatitis B Surface Ag: NEGATIVE

## 2019-03-18 LAB — RPR: RPR Ser Ql: NONREACTIVE

## 2019-03-18 LAB — VITAMIN B12: Vitamin B-12: 357 pg/mL (ref 180–914)

## 2019-03-18 LAB — FOLATE: Folate: 15.7 ng/mL (ref >5.9)

## 2019-03-18 MED ORDER — METOPROLOL TARTRATE 5 MG/5ML IV SOLN: 5.0000 mg | Freq: Four times a day (QID) | INTRAVENOUS | Status: DC | PRN

## 2019-03-18 MED ORDER — CLOPIDOGREL BISULFATE 75 MG PO TABS
75.0000 mg | ORAL_TABLET | Freq: Every day | ORAL | Status: DC
  Administered 2019-03-18 – 2019-03-22 (×5): 75 mg via ORAL
  Filled 2019-03-18 (×5): qty 1

## 2019-03-18 MED ORDER — SODIUM CHLORIDE 0.9 % IV BOLUS
500.0000 mL | Freq: Once | INTRAVENOUS | Status: AC
  Administered 2019-03-18: 500 mL via INTRAVENOUS

## 2019-03-18 MED ORDER — VANCOMYCIN HCL IN DEXTROSE 750-5 MG/150ML-% IV SOLN
750.0000 mg | Freq: Two times a day (BID) | INTRAVENOUS | Status: DC
  Administered 2019-03-18 – 2019-03-19 (×3): 750 mg via INTRAVENOUS
  Filled 2019-03-18 (×5): qty 150

## 2019-03-18 NOTE — Consult Note (Signed)
ANTICOAGULATION CONSULT NOTE   Pharmacy Consult for Heparin Indication: aortic occlusion  No Known Allergies  Patient Measurements: Height: 5\' 9"  (175.3 cm) Weight: 108 lb 7.5 oz (49.2 kg) IBW/kg (Calculated) : 70.7 Heparin Dosing Weight: 49.2  Vital Signs: BP: 147/85 (07/11 1547) Pulse Rate: 125 (07/11 1547)  Labs: Recent Labs    03/16/19 0557 03/17/19 0344  03/18/19 0330 03/18/19 0928 03/18/19 1647  HGB 12.2* 11.0*  --  9.2*  --   --   HCT 37.5* 33.7*  --  28.4*  --   --   PLT 255 250  --  243  --   --   APTT 75*  --   --   --   --   --   LABPROT 15.9*  --   --   --   --   --   INR 1.3*  --   --   --   --   --   HEPARINUNFRC 0.40  --    < > 0.39 0.27* 0.47  CREATININE 0.83 0.90  --  0.89  --   --    < > = values in this interval not displayed.    Estimated Creatinine Clearance: 65.3 mL/min (by C-G formula based on SCr of 0.89 mg/dL).   Assessment: Pharmacy consulted for heparin. No DOAC PTA. When clinically appropriate, patient will transition to oral when clinically appropriate.   7/6 Heparin infusion started @ 750 units/hr  7/7 @ 1229 HL: 0.26. Level is subtherapeutic. 700 unit bolus and rate increased to 950 units/hr.  7/7 @ 1857 HL: 0.53. Level is therapeutic.  7/8 @ 0111 HL: 0.37, aPTT: 68, Both levels are therapeutic. 7/8 @ 0756 HL: 0.38.  Level is therapeutic x 2.  7/9 @ 0557 aPTT: 75, HL: 0.40. Both levels are therapeutic and correlating.  7/10 @1340  HL: 0.38. Therapeutic s/p resuming dose after being stopped at 1623 yesterday and restarted today at 0700.  07/10 @ 2002 HL undetectable. Heparin drip stopped at 1730 due to line issue 7/11 @0330  HL 0.39 continue rate at 950  7/11 @0928  HL 0.27 will increase rate to 1050 units/hr, no bolus.  7/11 @1647  HL 0.47 therapeutic  Goal of Therapy:  Heparin level 0.3-0.7 units/ml Monitor platelets by anticoagulation protocol: Yes   Plan:  Will continue current heparin infusion rate at 1050 units/hr.  Recheck  heparin level level in 6 hours.  Hgb: 11>> 9.2- continue to monitor.  Continue to check daily HL and CBC per protocol.   Pearla Dubonnet, PharmD Clinical Pharmacist 03/18/2019 6:17 PM

## 2019-03-18 NOTE — Consult Note (Signed)
Pharmacy Antibiotic Note  Donald [name]. is a 59 y.o. male admitted on 03/13/2019 with leukocytosis, penis wound.  Pharmacy has been consulted for vancomycin dosing. Also on cefepime for wound infection.   Plan: Day 3 of vancomycin.  Change vancomycin to 750 mg q12H. Predicted AUC 514. Goal AUC 400-550. Plan to order levels for Monday. Monitor renal function.   Height: 5\' 9"  (175.3 cm) Weight: 108 lb 7.5 oz (49.2 kg) IBW/kg (Calculated) : 70.7  Temp (24hrs), Avg:97.9 F (36.6 C), Min:97.5 F (36.4 C), Max:98.5 F (36.9 C)  Recent Labs  Lab 03/13/19 1039 03/13/19 1207 03/14/19 0501 03/14/19 0847 03/14/19 2334 03/15/19 0111 03/16/19 0557 03/17/19 0344 03/18/19 0330  WBC  --   --  22.0*  --   --  26.4* 26.2* 23.2* 23.0*  CREATININE  --   --  0.81  --  0.86  --  0.83 0.90 0.89  LATICACIDVEN 2.3* 2.3*  --  1.6  --   --   --   --   --     Estimated Creatinine Clearance: 65.3 mL/min (by C-G formula based on SCr of 0.89 mg/dL).    No Known Allergies  Antimicrobials this admission: 7/6 cefepime >>  7/9 vancomcyin >>   Dose adjustments this admission: None  Microbiology results: 7/6 BCx: pending 7/6 UCx:  STAPHYLOCOCCUS EPIDERMIDIS 7/6 MRSA PCR: positive   Thank you for allowing pharmacy to be a part of this patient's care.  Oswald Hillock, PharmD, BCPS Clinical Pharmacist  03/18/2019 10:44 AM

## 2019-03-18 NOTE — Progress Notes (Signed)
Dr. Margaretmary Eddy was made aware patients heart rate ranged 120-130 after getting scheduled metoprolol this am. Per md will place orders for ekg, bolus and iv metoprolol. Will continue to monitor

## 2019-03-18 NOTE — Progress Notes (Signed)
Heart rate 125 , 5 mg iv lopressor pushed over 5 minutes. Post iv push, heart rate 113. Will continue to monitor closely

## 2019-03-18 NOTE — Evaluation (Signed)
Occupational Therapy Evaluation Patient Details Name: Donald [name]. MRN: [id number] DOB: 14-Nov-1960 Today's Date: 03/18/2019    History of Present Illness Donald [name] is a 54yoM who [facility] c continued BLE pain and AMS, found to be septic 2/2 UTI. PMH: PAD, ETOH, COPD, HTN, BPH. Pt underwent PTA in BLE/iliac AA on 7/6, and on 7/9 BLE endarterectomies (femoral/iliac) and stent placement (iliac and aortic). Pt was recently admitted 6/27-7/1 after fall/LOC and with ETOH withdrawl.   Clinical Impression   Donald [name] was seen for OT evaluation this date. Prior to hospital admission, pt was reportedly independent, however receiving support from his mother for transportation and IADLs. Pt limited by cognitive status at time of evaluation. Oriented to self and place (limited) only. Information on home set-up and PLOF obtained through chart review.  Pt typically lives alone, but most recently had been living with his mother after DC from prior hospitalization.  Currently pt demonstrates impairments in cognition, orientation, decreased BLE strength and ROM, as well as generalized weakness in BUE requiring max assist for ADLs at bed level. Pt maintained fetal position with hips rotated toward right and B knees flexed during evaluation. Pt able to move within very limited range given increased time. Pt states BLE hurt significantly and are limited at baseline. When given toothbrush for oral care on this date, pt required prompting in order to initiate and VCs t/o task. At one point pt attempted to put toothbrush under his covers. Mitts doffed for ADL (with RN approval), were re-applied at end of session. Pt would benefit from skilled OT to address noted impairments and functional limitations (see below for any additional details) in order to maximize safety and independence while minimizing falls risk and caregiver burden.  Upon hospital discharge, recommend pt discharge to [place].     Follow Up  Recommendations  SNF;Supervision/Assistance - 24 hour    Equipment Recommendations  3 in 1 bedside commode    Recommendations for Other Services       Precautions / Restrictions Precautions Precautions: Fall Precaution Comments: *confused, picking at lines, mittens donned. Restrictions Weight Bearing Restrictions: No      Mobility Bed Mobility Overal bed mobility: Needs Assistance             General bed mobility comments: Pt very limited for bed mobility. Unable to attempt semi-sup to sit 2/2 increased pain/cognitive status. Per physical therapy pt required mod assit for sup>sit.  Transfers                 General transfer comment: Deferred for pt safety.    Balance                                           ADL either performed or assessed with clinical judgement   ADL Overall ADL's : Needs assistance/impaired                                       General ADL Comments: Pt currently at bed level for max assist ALL ADL tasks including toileting, bathing, self-feeding, and grooming. VERY limited mobility. Legs remain flexed inward at fetal position with hips rotated toward pt right side. Pt states he was generally independent but "took a long time to get moving in the  morning". States his legs move very slowly and with increased pain.     Vision   Additional Comments: Unsure. Pt limited historian     Perception     Praxis      Pertinent Vitals/Pain Pain Assessment: Faces Faces Pain Scale: Hurts whole lot Pain Location: BLE increases to max pain with movement. Pain Descriptors / Indicators: Constant;Grimacing Pain Intervention(s): Limited activity within patient's tolerance;Monitored during session;Premedicated before session     Hand Dominance Right   Extremity/Trunk Assessment Upper Extremity Assessment Upper Extremity Assessment: Generalized weakness(Unable to achieve full ROM (may have been limited by  positioning in bed). Grip WFL, decreased FMC/GMC t/o BUE)   Lower Extremity Assessment Lower Extremity Assessment: Defer to PT evaluation(BLE remained in fetal position t/o majority of session. Pt was able to rotate pelivis to slightly more neutral position with significant effort/increased time to reposition self in bed.)       Communication Communication Communication: No difficulties(At times tangential and mumbling.)   Cognition Arousal/Alertness: Awake/alert Behavior During Therapy: Restless;Impulsive Overall Cognitive Status: Impaired/Different from baseline Area of Impairment: Memory;Safety/judgement;Orientation;Following commands;Problem solving                 Orientation Level: Disoriented to;Time;Situation(pt able to state name and that he was "in a hospital")   Memory: Decreased recall of precautions;Decreased short-term memory Following Commands: Follows one step commands inconsistently;Follows one step commands with increased time Safety/Judgement: Decreased awareness of safety;Decreased awareness of deficits   Problem Solving: Requires verbal cues;Requires tactile cues;Slow processing;Difficulty sequencing;Decreased initiation General Comments: Per RN pt confusion is improved from prior date, however he continues to remain impaired/different from baseline. Oriented to self and place (limited). Unable to state date or reason for being in hospital. Stated he just drove here from [place].   General Comments       Exercises Other Exercises Other Exercises: Education limited by pt cognitive status. OT assisted pt with oral care on this date. Pt required set up assist to brush teeth at bed level with cueing for sequencing and initiation of task.   Shoulder Instructions      Home Living Family/patient expects to be discharged to:: Private residence Living Arrangements: Alone(Recent DC home with mother after prior admission.) Available Help at Discharge:  Family;Available PRN/intermittently Type of Home: House Home Access: Stairs to enter [employer] of Steps: 2 Entrance Stairs-Rails: Right Home Layout: One level               Home Equipment: Walker - 2 wheels;Cane - single point          Prior Functioning/Environment Level of Independence: Needs assistance  Gait / Transfers Assistance Needed: household distances with intermittent SPC use, chronic BLE fatigue from vascular claudication issues s/p stent placement x7 ADL's / Homemaking Assistance Needed: Mother assists with IADLs. Pt states he was generally independent, however may be unreliable historian 2/2 confusion and limited orientation at time of evaluation.            OT Problem List: Decreased strength;Decreased coordination;Pain;Decreased range of motion;Decreased cognition;Decreased activity tolerance;Decreased safety awareness;Decreased knowledge of use of DME or AE      OT Treatment/Interventions: Self-care/ADL training;Balance training;Therapeutic exercise;Therapeutic activities;Cognitive remediation/compensation;DME and/or AE instruction;Patient/family education    OT Goals(Current goals can be found in the care plan section) Acute Rehab OT Goals Patient Stated Goal: To have less pain in my legs OT Goal Formulation: With patient Time For Goal Achievement: 04/01/19 Potential to Achieve Goals: Fair ADL Goals Pt Will Perform Grooming: sitting;with  mod assist;with adaptive equipment(With LRAD for improved safety and functional independence.) Pt Will Perform Lower Body Dressing: with mod assist;with set-up;with adaptive equipment;sitting/lateral leans(With LRAD for improved safety and functional independence.) Pt Will Transfer to Toilet: bedside commode;with mod assist;stand pivot transfer(With LRAD for improved safety and functional independence.)  OT Frequency: Min 1X/week   Barriers to D/C: Decreased caregiver support;Inaccessible home environment           Co-evaluation              AM-PAC OT "6 Clicks" Daily Activity     Outcome Measure Help from another person eating meals?: A Lot Help from another person taking care of personal grooming?: A Lot Help from another person toileting, which includes using toliet, bedpan, or urinal?: Total Help from another person bathing (including washing, rinsing, drying)?: A Lot Help from another person to put on and taking off regular upper body clothing?: A Lot Help from another person to put on and taking off regular lower body clothing?: A Lot 6 Click Score: 11   End of Session    Activity Tolerance: Patient limited by pain;Treatment limited secondary to agitation Patient left: in bed;with call bell/phone within reach;with bed alarm set;with restraints reapplied  OT Visit Diagnosis: Other abnormalities of gait and mobility (R26.89);Muscle weakness (generalized) (M62.81);Pain Pain - Right/Left: (Both) Pain - part of body: Hip;Knee;Leg                Time: 7915-0569 OT Time Calculation (min): 21 min Charges:  OT General Charges $OT Visit: 1 Visit OT Evaluation $OT Eval Moderate Complexity: 1 Mod OT Treatments $Self Care/Home Management : 8-22 mins  Shara Blazing, M.S., OTR/L Ascom: [phone] 03/18/19, 11:39 AM

## 2019-03-18 NOTE — Consult Note (Signed)
ANTICOAGULATION CONSULT NOTE   Pharmacy Consult for Heparin Indication: aortic occlusion  No Known Allergies  Patient Measurements: Height: 5\' 9"  (175.3 cm) Weight: 108 lb 7.5 oz (49.2 kg) IBW/kg (Calculated) : 70.7 Heparin Dosing Weight: 49.2  Vital Signs: Temp: 98.5 F (36.9 C) (07/11 0455) Temp Source: Oral (07/11 0455) BP: 118/85 (07/11 0750) Pulse Rate: 121 (07/11 0750)  Labs: Recent Labs    03/16/19 0557 03/17/19 0344  03/17/19 2002 03/18/19 0330 03/18/19 0928  HGB 12.2* 11.0*  --   --  9.2*  --   HCT 37.5* 33.7*  --   --  28.4*  --   PLT 255 250  --   --  243  --   APTT 75*  --   --   --   --   --   LABPROT 15.9*  --   --   --   --   --   INR 1.3*  --   --   --   --   --   HEPARINUNFRC 0.40  --    < > <0.10* 0.39 0.27*  CREATININE 0.83 0.90  --   --  0.89  --    < > = values in this interval not displayed.    Estimated Creatinine Clearance: 65.3 mL/min (by C-G formula based on SCr of 0.89 mg/dL).   Assessment: Pharmacy consulted for heparin. No DOAC PTA. When clinically appropriate, patient will transition to oral when clinically appropriate.   7/6 Heparin infusion started @ 750 units/hr  7/7 @ 1229 HL: 0.26. Level is subtherapeutic. 700 unit bolus and rate increased to 950 units/hr.  7/7 @ 1857 HL: 0.53. Level is therapeutic.  7/8 @ 0111 HL: 0.37, aPTT: 68, Both levels are therapeutic. 7/8 @ 0756 HL: 0.38.  Level is therapeutic x 2.  7/9 @ 0557 aPTT: 75, HL: 0.40. Both levels are therapeutic and correlating.  7/10 @1340  HL: 0.38. Therapeutic s/p resuming dose after being stopped at 1623 yesterday and restarted today at 0700.  07/10 @ 2002 HL undetectable. Heparin drip stopped at 1730 due to line issue 7/11 @0330  HL 0.39 continue rate at 950  7/11 @0928  HL 0.27 will increase rate to 1050 units/hr, no bolus.   Goal of Therapy:  Heparin level 0.3-0.7 units/ml Monitor platelets by anticoagulation protocol: Yes   Plan:  Heparin level is slightly  subtherapeutic. Will increase current heparin infusion rate to 1000 units/hr.  Recheck heparin level level in 6 hours.  Hgb: 11>> 9.2- continue to monitor.  Continue to check daily HL and CBC per protocol.   Oswald Hillock, PharmD, BCPS Clinical Pharmacist 03/18/2019 10:36 AM

## 2019-03-18 NOTE — Consult Note (Addendum)
ANTICOAGULATION CONSULT NOTE   Pharmacy Consult for Heparin Indication: aortic occlusion  No Known Allergies  Patient Measurements: Height: 5\' 9"  (175.3 cm) Weight: 108 lb 7.5 oz (49.2 kg) IBW/kg (Calculated) : 70.7 Heparin Dosing Weight: 49.2  Vital Signs: Temp: 97.5 F (36.4 C) (07/10 2006) Temp Source: Oral (07/10 2006) BP: 143/88 (07/10 2006) Pulse Rate: 112 (07/10 2006)  Labs: Recent Labs    03/16/19 0557 03/17/19 0344 03/17/19 1340 03/17/19 2002 03/18/19 0330  HGB 12.2* 11.0*  --   --  9.2*  HCT 37.5* 33.7*  --   --  28.4*  PLT 255 250  --   --  243  APTT 75*  --   --   --   --   LABPROT 15.9*  --   --   --   --   INR 1.3*  --   --   --   --   HEPARINUNFRC 0.40  --  0.38 <0.10* 0.39  CREATININE 0.83 0.90  --   --  0.89    Estimated Creatinine Clearance: 65.3 mL/min (by C-G formula based on SCr of 0.89 mg/dL).   Assessment: Pharmacy consulted for heparin. No DOAC PTA. When clinically appropriate, patient will transition to oral when clinically appropriate.   7/6 Heparin infusion started @ 750 units/hr  7/6 @ 2315 HL: 0.14. Level subtherapeutic.  Infusion increased to 900 units/hr.  7/7 @ 0503 HL: 0.47. Level is therapeutic x 1- heparin infusion rate of  900 units/hr. 7/7 @ 1229 HL: 0.26. Level is subtherapeutic. 700 unit bolus and rate increased to 950 units/hr.  7/7 @ 1857 HL: 0.53. Level is therapeutic.  7/8 @ 0111 HL: 0.37, aPTT: 68, Both levels are therapeutic. 7/8 @ 0756 HL: 0.38.  Level is therapeutic x 2.  7/9 @ 0557 aPTT: 75, HL: 0.40. Both levels are therapeutic and correlating.  7/10 @1340  HL: 0.38. Therapeutic s/p resuming dose after being stopped at 1623 yesterday and restarted today at 0700.  07/10 @ 2002 HL undetectable. Heparin drip stopped at 1730 due to line issue  Goal of Therapy:  Heparin level 0.3-0.7 units/ml Monitor platelets by anticoagulation protocol: Yes   Plan:  7/11 @ 0330 HL: 0.39. Level is therapeutic x 1. Will continue  current heparin infusion rate of 950 units/hr.  Recheck confirmatory level in 6 hours.  Hgb: 11>> 9.2- continue to monitor.  Continue to check daily HL and CBC per protocol.   Pernell Dupre, PharmD, BCPS Clinical Pharmacist 03/18/2019 4:15 AM

## 2019-03-18 NOTE — Progress Notes (Signed)
[facility] [place] NAME: Donald [name]    MR#:  [id number]  DATE OF BIRTH:  1961/03/29  SUBJECTIVE:  CHIEF COMPLAINT:   Chief Complaint  Patient presents with  . Altered Mental Status   Patient is altered and tachycardic with heart rate fluctuating at around 120  trying to remove mittens Alert and oriented to self only.  REVIEW OF SYSTEMS:  Review of Systems  Unable to perform ROS: Acuity of condition  Respiratory: Negative for shortness of breath.   Cardiovascular: Negative for chest pain.    DRUG ALLERGIES:  No Known Allergies VITALS:  Blood pressure (!) 147/85, pulse (!) 125, temperature 98.5 F (36.9 C), temperature source Oral, resp. rate 19, height 5\' 9"  (1.753 m), weight 49.2 kg, SpO2 100 %. PHYSICAL EXAMINATION:  Physical Exam Vitals signs reviewed.  Constitutional:      General: He is not in acute distress.    Appearance: He is ill-appearing. He is not toxic-appearing.     Comments: malnourished  HENT:     Head: Normocephalic and atraumatic.  Cardiovascular:     Rate and Rhythm: Normal rate and regular rhythm.     Comments: Some more color to BLLE with purple discoloration still present along toes Pulmonary:     Effort: Pulmonary effort is normal.     Breath sounds: Normal breath sounds.  Abdominal:     General: Abdomen is flat.     Palpations: Abdomen is soft.  Neurological:     General: No focal deficit present.     Comments: A&O to self only    LABORATORY PANEL:  Male CBC Recent Labs  Lab 03/18/19 0330  WBC 23.0*  HGB 9.2*  HCT 28.4*  PLT 243   ------------------------------------------------------------------------------------------------------------------ Chemistries  Recent Labs  Lab 03/13/19 0931  03/16/19 0557  03/18/19 0330  NA 146*   < > 149*   < > 146*  K 3.1*   < > 3.6   < > 3.9  CL 110   < > 118*   < > 120*  CO2 21*   < > 21*   < > 19*  GLUCOSE 140*   < > 94   < > 89  BUN 18   <  > 17   < > 23*  CREATININE 0.72   < > 0.83   < > 0.89  CALCIUM 10.5*   < > 9.3   < > 8.7*  MG  --    < > 2.0  --   --   AST 86*  --   --   --   --   ALT 29  --   --   --   --   ALKPHOS 87  --   --   --   --   BILITOT 1.8*  --   --   --   --    < > = values in this interval not displayed.   RADIOLOGY:  No results found. ASSESSMENT AND PLAN:  Donald Atkinsonis a 58yo male with a PMH of PAD, h/o ETOH use disorder, COPD, HTN, BPH, h/o tobacco use disorder, h/o seizure, and protein-calorie malnutrition who presented with worsening AMS found to be septic.  1.  Acute deliriumurosepsis, sepsis improved; Leukocytosis: pt remains afebrile but still altered - ID following, appreciate recommednations and patient restarted on cefepime and vanc - Leukocytosis believed to be 2/2 PAD and ischemia, improved after procedure; WBCs (26.4>23.2) -  CXR with no signs of acute process, procalcitonin 0.53>0.34  -Blood cultures, NGx4d - Urine culturewith staphepidermidis, which is likely contaminate per ID - MRSA PCRpositive - IVFs and PRN bolus to keep MAP >60 per sepsis protocol - palliative care following, patient now DNR - may need to r/o Wernickes or Korasakoff's given continued AMS -we will check B12 thiamine and folate levels  2.Tachycardia -IV fluid bolus and continue IV fluids -Monitor patient telemetry - EKG -Cardiology consult -IV Lopressor as needed  3.PAD with ischemic feet BL s/p PCI 03/13/19, for revascularization on 03/16/2019 - Vascular surgeryfollowing-patient to go forsurgery for revascularization with possible femoral endarterectomy/extensive aortoiliac stenting -continueheparin gttper vascular sx  4. ETOH abuse - Recent hospitalizationforDTs -Noactivesigns of withdrawal, CIWA monitoring - Check daily BMP+Mg- wnl 7/9 - Daily Folate, MVI;continue high-dose IV thiamine x3 days - PT/OT evaluation for mobility  5. Penile/ sacral (stage II)/ scrotal  decubiti ulcerss/p revascularization  - woundcare consulted -Foley catheter in place to help keep the area dry - Likely due to the condom cath use in the previous hospitalization - ID has sent aerobic culture as well as HSV of ulcers  6. Hypokalemia Hypomagnesemia -Monitor K and mag, repleteas indicated  7.HTN-Goal BP <140/90 -OnClonidine, metoprolol and amlodipine - IV labetalol PRN  8.Severe, protein calorie malnutrition- likely 2/2 ETOH use disorder - nutritionfollowing  All the records are reviewed and case discussed with Care Management/Social Worker. Management plans discussed with the patient, family and they are in agreement.  CODE STATUS: DNR  POSSIBLE D/C IN 2-3 DAYS, DEPENDING ON CLINICAL CONDITION.  Donald Shirley, DO PGY-3, [name]  03/18/2019 at 4:04 PM  Between 7am to 6pm - Pager - [phone]  After 6pm go to [email or web address] - Proofreader  [facility]  Office  [phone]  CC: Primary care physician; Olin Hauser, DO

## 2019-03-18 NOTE — Progress Notes (Signed)
ID Pt awake and alert Communicating and following commands Patient Vitals for the past 24 hrs:  BP Temp Temp src Pulse Resp SpO2  03/18/19 1547 (!) 147/85 - - (!) 125 - -  03/18/19 1228 134/89 - - (!) 135 - -  03/18/19 0750 118/85 - - (!) 121 19 100 %  03/18/19 0455 133/79 98.5 F (36.9 C) Oral (!) 112 - 100 %  03/17/19 2006 (!) 143/88 (!) 97.5 F (36.4 C) Oral (!) 112 18 100 %  03/17/19 1936 - - - - - 97 %   B/l wound vac groin  CBC Latest Ref Rng & Units 03/18/2019 03/17/2019 03/16/2019  WBC 4.0 - 10.5 K/uL 23.0(H) 23.2(H) 26.2(H)  Hemoglobin 13.0 - 17.0 g/dL 9.2(L) 11.0(L) 12.2(L)  Hematocrit 39.0 - 52.0 % 28.4(L) 33.7(L) 37.5(L)  Platelets 150 - 400 K/uL 243 250 255    CMP Latest Ref Rng & Units 03/18/2019 03/17/2019 03/16/2019  Glucose 70 - 99 mg/dL 89 114(H) 94  BUN 6 - 20 mg/dL 23(H) 23(H) 17  Creatinine 0.61 - 1.24 mg/dL 0.89 0.90 0.83  Sodium 135 - 145 mmol/L 146(H) 151(H) 149(H)  Potassium 3.5 - 5.1 mmol/L 3.9 3.7 3.6  Chloride 98 - 111 mmol/L 120(H) 125(H) 118(H)  CO2 22 - 32 mmol/L 19(L) 20(L) 21(L)  Calcium 8.9 - 10.3 mg/dL 8.7(L) 8.8(L) 9.3  Total Protein 6.5 - 8.1 g/dL - - -  Total Bilirubin 0.3 - 1.2 mg/dL - - -  Alkaline Phos 38 - 126 U/L - - -  AST 15 - 41 U/L - - -  ALT 0 - 44 U/L - - -    58 year old male with history of COPD, EtOH abuse, recent hospitalization for DVT, peripheral arterial disease presents with inability to walk with discoloration of the toes and and for hallucination   leukocytosis likely related to the ischemia of the toes with peripheral arterial disease. I doubt this is due to urinary tract infection with staph epidermidis. Patient has undergone extensive endarterectomies on bilateral lower extremities arteries. He does have a wound VAC on both groins. currently on vancomycin and cefepime.will continue until early next week  Staph epidermidis in the urinary culture is a contaminant from the skin.  Penile erosions. Likely due to the  condom cath use in the previous hospitalization but need to rule out herpetic ulceration.  sent aerobic culture as well as HSV. Aerobic culture neg He currently has a Foley catheter in place.  Stage II sacral decub as told by his nurse.  Failure to thrive   Delirium, hallucination could be due to the alcohol use also need to rule out Wernicke's encephalopathy or Korsakoff psychosis. HIV nonreactive in June. RPR NR___  Discussed the management with the patient

## 2019-03-18 NOTE — Progress Notes (Signed)
Subjective: Interval History: none.. No specific complaint offered.  Mittens in place.  Clinically appears comfortable  Objective: Vital signs in last 24 hours: Temp:  [97.5 F (36.4 C)-98.5 F (36.9 C)] 98.5 F (36.9 C) (07/11 0455) Pulse Rate:  [109-149] 121 (07/11 0750) Resp:  [18-19] 19 (07/11 0750) BP: (118-155)/(79-95) 118/85 (07/11 0750) SpO2:  [97 %-100 %] 100 % (07/11 0750)  Intake/Output from previous day: 07/10 0701 - 07/11 0700 In: 2875.8 [P.O.:600; I.V.:1831.2; IV Piggyback:444.6] Out: 1250 [Urine:1250] Intake/Output this shift: No intake/output data recorded.  General appearance: alert, cooperative, distracted and no distress Extremities: Contracted, femoral incision VAC in place bilaterally, minimal drainage  Lab Results: Recent Labs    03/17/19 0344 03/18/19 0330  WBC 23.2* 23.0*  HGB 11.0* 9.2*  HCT 33.7* 28.4*  PLT 250 243   BMET Recent Labs    03/17/19 0344 03/18/19 0330  NA 151* 146*  K 3.7 3.9  CL 125* 120*  CO2 20* 19*  GLUCOSE 114* 89  BUN 23* 23*  CREATININE 0.90 0.89  CALCIUM 8.8* 8.7*    Studies/Results: Ct Head Wo Contrast  Result Date: 03/13/2019 CLINICAL DATA:  Lucent a shins, altered level of consciousness. EXAM: CT HEAD WITHOUT CONTRAST TECHNIQUE: Contiguous axial images were obtained from the base of the skull through the vertex without intravenous contrast. COMPARISON:  Head CT dated 03/04/2019. FINDINGS: Brain: Generalized parenchymal volume loss with commensurate dilatation of the ventricles and sulci. Chronic small vessel ischemic changes again noted within the bilateral periventricular and subcortical white matter regions. Old lacunar infarcts again noted within each basal ganglia region. No mass, hemorrhage, edema or other evidence of acute parenchymal abnormality. No extra-axial hemorrhage. Vascular: Chronic calcified atherosclerotic changes of the large vessels at the skull base. No unexpected hyperdense vessel. Skull: Normal.  Negative for fracture or focal lesion. Sinuses/Orbits: No acute finding. Other: None. IMPRESSION: 1. No acute findings. No intracranial mass, hemorrhage or edema. 2. Atrophy and chronic ischemic changes, as detailed above. Electronically Signed   By: Franki Cabot M.D.   On: 03/13/2019 12:12   Ct Head Wo Contrast  Result Date: 03/04/2019 CLINICAL DATA:  Altered level of consciousness, heavy alcohol intake today EXAM: CT HEAD WITHOUT CONTRAST TECHNIQUE: Contiguous axial images were obtained from the base of the skull through the vertex without intravenous contrast. Sagittal and coronal MPR images reconstructed from axial data set. COMPARISON:  05/30/2011 FINDINGS: Brain: Generalized atrophy. Normal ventricular morphology. No midline shift or mass effect. Small vessel chronic ischemic changes of deep cerebral white matter. 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 No intracranial hemorrhage, mass lesion, evidence of acute infarction, or extra-axial fluid collection. Vascular: Atherosclerotic calcifications of internal carotid and vertebral arteries at skull base Skull: Demineralized but intact Sinuses/Orbits: Clear Other: N/A IMPRESSION: Atrophy with extensive small vessel chronic ischemic changes of deep cerebral white matter, pronounced for age. 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 No acute intracranial abnormalities. Electronically Signed   By: Lavonia Dana M.D.   On: 03/04/2019 22:26   Dg Chest Port 1 View  Result Date: 03/16/2019 CLINICAL DATA:  Leukocytosis. Ex-smoker. History of hypertension and emphysema. EXAM: PORTABLE CHEST 1 VIEW COMPARISON:  03/13/2019. FINDINGS: Normal sized heart. Clear lungs with stable biapical bullous changes. The lungs remain mildly hyperexpanded. Diffuse osteopenia. IMPRESSION: No acute abnormality. Stable mild changes of COPD. Electronically Signed   By: Claudie Revering M.D.   On: 03/16/2019  09:57   Dg  Chest Portable 1 View  Result Date: 03/13/2019 CLINICAL DATA:  Pt comes into the ED via EMS from home, EMS reports pt lives with his mother and was here last week for detox, states he has been having hallucinations, not eating well ; Tachycardia Former smoker EXAM: PORTABLE CHEST 1 VIEW COMPARISON:  06/15/2016, 11/18/2011 and CT of the chest on 02/15/2014 FINDINGS: Heart size is normal. The lungs are hyperinflated. There is atherosclerotic calcification of the thoracic aorta. There is perihilar peribronchial thickening. Stable scarring in the RIGHT UPPER lobe. There is increased streaky opacity in the MEDIAL LEFT lung base, consistent with developing infiltrate or significant atelectasis. There is no pulmonary edema. IMPRESSION: 1. Hyperinflation and bronchitic changes. 2. Developing atelectasis or infiltrate in the MEDIAL LEFT lung base. Electronically Signed   By: Nolon Nations M.D.   On: 03/13/2019 09:55   Vascular C-arm Images Only ([facility])  Result Date: 03/16/2019 Please refer to notes tab for details about interventional procedure. (Op Note)  Anti-infectives: Anti-infectives (From admission, onward)   Start     Dose/Rate Route Frequency Ordered Stop   03/17/19 2000  vancomycin (VANCOCIN) 500 mg in sodium chloride 0.9 % 100 mL IVPB     500 mg 100 mL/hr over 60 Minutes Intravenous Every 12 hours 03/17/19 1008     03/17/19 0800  vancomycin (VANCOCIN) IVPB 750 mg/150 ml premix  Status:  Discontinued     750 mg 150 mL/hr over 60 Minutes Intravenous Every 12 hours 03/16/19 1955 03/17/19 1008   03/16/19 2200  ceFEPIme (MAXIPIME) 2 g in sodium chloride 0.9 % 100 mL IVPB     2 g 200 mL/hr over 30 Minutes Intravenous Every 12 hours 03/16/19 1944     03/16/19 2000  vancomycin (VANCOCIN) IVPB 1000 mg/200 mL premix     1,000 mg 200 mL/hr over 60 Minutes Intravenous  Once 03/16/19 1955 03/16/19 2202   03/16/19 1100  ceFAZolin (ANCEF) IVPB 2g/100 mL premix    Note to Pharmacy: Please  send with patient to OR   2 g 200 mL/hr over 30 Minutes Intravenous 30 min pre-op 03/15/19 1236 03/16/19 1243   03/15/19 2000  ciprofloxacin (CIPRO) tablet 500 mg  Status:  Discontinued     500 mg Oral 2 times daily 03/15/19 1515 03/16/19 1944   03/15/19 0957  ceFAZolin (ANCEF) IVPB 2g/100 mL premix  Status:  Discontinued     2 g 200 mL/hr over 30 Minutes Intravenous 30 min pre-op 03/15/19 0957 03/15/19 1236   03/15/19 0909  ceFAZolin (ANCEF) IVPB 2g/100 mL premix  Status:  Discontinued     2 g 200 mL/hr over 30 Minutes Intravenous 30 min pre-op 03/15/19 0909 03/15/19 0956   03/15/19 0800  vancomycin (VANCOCIN) IVPB 1000 mg/200 mL premix  Status:  Discontinued     1,000 mg 200 mL/hr over 60 Minutes Intravenous Every 24 hours 03/14/19 1339 03/15/19 1515   03/14/19 0000  vancomycin (VANCOCIN) IVPB 750 mg/150 ml premix  Status:  Discontinued     750 mg 150 mL/hr over 60 Minutes Intravenous Every 12 hours 03/13/19 1512 03/14/19 1339   03/13/19 2000  ceFEPIme (MAXIPIME) 2 g in sodium chloride 0.9 % 100 mL IVPB  Status:  Discontinued     2 g 200 mL/hr over 30 Minutes Intravenous Every 8 hours 03/13/19 1512 03/15/19 0753   03/13/19 1015  ceFEPIme (MAXIPIME) 2 g in sodium chloride 0.9 % 100 mL IVPB     2 g 200 mL/hr over 30 Minutes  Intravenous  Once 03/13/19 1012 03/13/19 1139   03/13/19 1015  metroNIDAZOLE (FLAGYL) IVPB 500 mg     500 mg 100 mL/hr over 60 Minutes Intravenous  Once 03/13/19 1012 03/13/19 1243   03/13/19 1015  vancomycin (VANCOCIN) IVPB 1000 mg/200 mL premix     1,000 mg 200 mL/hr over 60 Minutes Intravenous  Once 03/13/19 1012 03/13/19 1316      Assessment/Plan: s/p Procedure(s): ENDARTERECTOMY FEMORAL (Bilateral) AORTIC ILIAC STENT (Bilateral) Postop day 2, status post bilateral femoral endarterectomy, and extensive stenting to the infrarenal level to re-establish patency to the infrainguinal level.. On heparin, aspirin, will start Plavix.  Placement pending.  On  vancomycin.   LOS: 5 days   Keian Odriscoll A Jhoselyn Ruffini 03/18/2019, 7:57 AM

## 2019-03-19 LAB — CBC
HCT: 27.1 % — ABNORMAL LOW (ref 39.0–52.0)
Hemoglobin: 8.9 g/dL — ABNORMAL LOW (ref 13.0–17.0)
MCH: 34.1 pg — ABNORMAL HIGH (ref 26.0–34.0)
MCHC: 32.8 g/dL (ref 30.0–36.0)
MCV: 103.8 fL — ABNORMAL HIGH (ref 80.0–100.0)
Platelets: 252 10*3/uL (ref 150–400)
RBC: 2.61 MIL/uL — ABNORMAL LOW (ref 4.22–5.81)
RDW: 14.2 % (ref 11.5–15.5)
WBC: 18.5 10*3/uL — ABNORMAL HIGH (ref 4.0–10.5)
nRBC: 0.2 % (ref 0.0–0.2)

## 2019-03-19 LAB — AEROBIC CULTURE W GRAM STAIN (SUPERFICIAL SPECIMEN)
Culture: NO GROWTH
Gram Stain: NONE SEEN

## 2019-03-19 LAB — HEPARIN LEVEL (UNFRACTIONATED): Heparin Unfractionated: 0.52 IU/mL (ref 0.30–0.70)

## 2019-03-19 LAB — CREATININE, SERUM
Creatinine, Ser: 0.91 mg/dL (ref 0.61–1.24)
GFR calc Af Amer: >60 mL/min (ref >60)
GFR calc non Af Amer: >60 mL/min (ref >60)

## 2019-03-19 MED ORDER — METOPROLOL SUCCINATE ER 100 MG PO TB24
100.0000 mg | ORAL_TABLET | Freq: Every day | ORAL | Status: DC
  Administered 2019-03-19 – 2019-03-20 (×2): 100 mg via ORAL
  Filled 2019-03-19 (×2): qty 1

## 2019-03-19 NOTE — Plan of Care (Signed)

## 2019-03-19 NOTE — Consult Note (Signed)
ANTICOAGULATION CONSULT NOTE   Pharmacy Consult for Heparin Indication: aortic occlusion  No Known Allergies  Patient Measurements: Height: 5\' 9"  (175.3 cm) Weight: 108 lb 7.5 oz (49.2 kg) IBW/kg (Calculated) : 70.7 Heparin Dosing Weight: 49.2  Vital Signs: Temp: 98.1 F (36.7 C) (07/12 0516) Temp Source: Oral (07/12 0516) BP: 141/92 (07/12 0516) Pulse Rate: 130 (07/12 0516)  Labs: Recent Labs    03/17/19 0344  03/18/19 0330  03/18/19 1647 03/18/19 2322 03/19/19 0521  HGB 11.0*  --  9.2*  --   --   --  8.9*  HCT 33.7*  --  28.4*  --   --   --  27.1*  PLT 250  --  243  --   --   --  252  HEPARINUNFRC  --    < > 0.39   < > 0.47 0.59 0.52  CREATININE 0.90  --  0.89  --   --   --   --    < > = values in this interval not displayed.    Estimated Creatinine Clearance: 65.3 mL/min (by C-G formula based on SCr of 0.89 mg/dL).   Assessment: Pharmacy consulted for heparin. No DOAC PTA. When clinically appropriate, patient will transition to oral when clinically appropriate.   7/6 Heparin infusion started @ 750 units/hr  7/7 @ 1229 HL: 0.26. Level is subtherapeutic. 700 unit bolus and rate increased to 950 units/hr.  7/7 @ 1857 HL: 0.53. Level is therapeutic.  7/8 @ 0111 HL: 0.37, aPTT: 68, Both levels are therapeutic. 7/8 @ 0756 HL: 0.38.  Level is therapeutic x 2.  7/9 @ 0557 aPTT: 75, HL: 0.40. Both levels are therapeutic and correlating.  7/10 @1340  HL: 0.38. Therapeutic s/p resuming dose after being stopped at 1623 yesterday and restarted today at 0700.  07/10 @ 2002 HL undetectable. Heparin drip stopped at 1730 due to line issue 7/11 @0330  HL 0.39 continue rate at 950  7/11 @0928  HL 0.27 will increase rate to 1050 units/hr, no bolus.  7/11 @1647  HL 0.47 therapeutic x 1 0711 @ 2322 HL: 0.59. Level therapeutic x 2.   Goal of Therapy:  Heparin level 0.3-0.7 units/ml Monitor platelets by anticoagulation protocol: Yes   Plan:  2481 @ 0521 HL: 0.52. Level remains  therapeutic Will continue current heparin infusion rate at 1050 units/hr.    Hgb: 11>> 9.2>> 8.9- continue to monitor.  Continue to check daily HL and CBC per protocol.   Donald [name], PharmD, BCPS Clinical Pharmacist 03/19/2019 6:19 AM

## 2019-03-19 NOTE — Consult Note (Signed)
Reason for Consult: Altered mental status Referring Physician: Hospitalist Dr. Lala Lund. is an 58 y.o. male.  HPI: Patient was brought because of altered mental status no significant baseline medical problems alcohol abuse peripheral vascular disease contracted bedbound with hypertension patient was found to be tachycardic and altered mental status thought to be uroseptic so was admitted for antibiotic therapy.  Patient is unable to give much of history because of his current status no rash here for follow-up evaluation  Past Medical History:  Diagnosis Date  . Depression   . Emphysema of lung ([place] )   . Essential hypertension   . History of chicken pox   . Low weight   . Neuropathy    bilateral lower legs  . Peripheral vascular disease ([place])   . Prostate disease   . Seasonal allergies   . Seizures ([place])   . Wears dentures    full upper    Past Surgical History:  Procedure Laterality Date  . ABDOMINAL AORTIC ENDOVASCULAR STENT GRAFT Bilateral 03/16/2019   Procedure: AORTIC ILIAC STENT;  Surgeon: Algernon Huxley, MD;  Location: [facility];  Service: Vascular;  Laterality: Bilateral;  . CATARACT EXTRACTION W/PHACO Left 05/18/2018   Procedure: CATARACT EXTRACTION PHACO AND INTRAOCULAR LENS PLACEMENT ([place]) LEFT;  Surgeon: Leandrew Koyanagi, MD;  Location: [place];  Service: Ophthalmology;  Laterality: Left;  . COLONOSCOPY WITH PROPOFOL N/A 02/25/2016   Procedure: COLONOSCOPY WITH PROPOFOL;  Surgeon: [name], MD;  Location: [facility];  Service: Endoscopy;  Laterality: N/A;  . ENDARTERECTOMY FEMORAL Bilateral 03/16/2019   Procedure: ENDARTERECTOMY FEMORAL;  Surgeon: Algernon Huxley, MD;  Location: [facility];  Service: Vascular;  Laterality: Bilateral;  . EYE SURGERY  2015   Cataract  . LOWER EXTREMITY ANGIOGRAPHY Left 03/13/2019   Procedure: Lower Extremity Angiography (LEFT);  Surgeon: Algernon Huxley, MD;  Location: [place] [facility];  Service: Cardiovascular;   Laterality: Left;  [facility] VASCULAR SURGERY     "7 stents in legs"    Family History  Problem Relation Age of Onset  . Ulcers Father        bleeding  . Cancer Mother        Breast    Social History:  reports that he quit smoking about 13 months ago. His smoking use included cigarettes. He has a 17.50 pack-year smoking history. He quit smokeless tobacco use about 2 years ago. He reports previous alcohol use. He reports that he does not use drugs.  Allergies: No Known Allergies  Medications: I have reviewed the patient's current medications.  Results for orders placed or performed during the hospital encounter of 03/13/19 (from the past 48 hour(s))  Heparin level (unfractionated)     Status: Abnormal   Collection Time: 03/17/19  8:02 PM  Result Value Ref Range   Heparin Unfractionated <0.10 (L) 0.30 - 0.70 IU/mL    Comment: RESULT REPEATED AND VERIFIED (NOTE) If heparin results are below expected values, and patient dosage has  been confirmed, suggest follow up testing of antithrombin III levels. Performed at [facility], [address] [place], [place] [address]   CBC     Status: Abnormal   Collection Time: 03/18/19  3:30 AM  Result Value Ref Range   WBC 23.0 (H) 4.0 - 10.5 K/uL   RBC 2.72 (L) 4.22 - 5.81 MIL/uL   Hemoglobin 9.2 (L) 13.0 - 17.0 g/dL   HCT 28.4 (L) 39.0 - 52.0 %   MCV 104.4 (H)  80.0 - 100.0 fL   MCH 33.8 26.0 - 34.0 pg   MCHC 32.4 30.0 - 36.0 g/dL   RDW 14.4 11.5 - 15.5 %   Platelets 243 150 - 400 K/uL   nRBC 0.0 0.0 - 0.2 %    Comment: Performed at [facility], [address] [place], [place] [address]  Basic metabolic panel     Status: Abnormal   Collection Time: 03/18/19  3:30 AM  Result Value Ref Range   Sodium 146 (H) 135 - 145 mmol/L   Potassium 3.9 3.5 - 5.1 mmol/L   Chloride 120 (H) 98 - 111 mmol/L   CO2 19 (L) 22 - 32 mmol/L   Glucose, Bld 89 70 - 99 mg/dL   BUN 23 (H) 6 - 20 mg/dL   Creatinine, Ser 0.89 0.61 - 1.24 mg/dL    Calcium 8.7 (L) 8.9 - 10.3 mg/dL   GFR calc non Af Amer >60 >60 mL/min   GFR calc Af Amer >60 >60 mL/min   Anion gap 7 5 - 15    Comment: Performed at [facility], [place], [place] [address]  Heparin level (unfractionated)     Status: None   Collection Time: 03/18/19  3:30 AM  Result Value Ref Range   Heparin Unfractionated 0.39 0.30 - 0.70 IU/mL    Comment: (NOTE) If heparin results are below expected values, and patient dosage has  been confirmed, suggest follow up testing of antithrombin III levels. Performed at [facility], [place], [place] [address]   Heparin level (unfractionated)     Status: Abnormal   Collection Time: 03/18/19  9:28 AM  Result Value Ref Range   Heparin Unfractionated 0.27 (L) 0.30 - 0.70 IU/mL    Comment: (NOTE) If heparin results are below expected values, and patient dosage has  been confirmed, suggest follow up testing of antithrombin III levels. Performed at [facility], [place], [place] [address]   Heparin level (unfractionated)     Status: None   Collection Time: 03/18/19  4:47 PM  Result Value Ref Range   Heparin Unfractionated 0.47 0.30 - 0.70 IU/mL    Comment: (NOTE) If heparin results are below expected values, and patient dosage has  been confirmed, suggest follow up testing of antithrombin III levels. Performed at [facility], [address] [place], [place] [address]   Folate     Status: None   Collection Time: 03/18/19  4:47 PM  Result Value Ref Range   Folate 15.7 >5.9 ng/mL    Comment: Performed at [facility], [address] [place], [place] [address]  Vitamin B12     Status: None   Collection Time: 03/18/19  4:47 PM  Result Value Ref Range   Vitamin B-12 357 180 - 914 pg/mL    Comment: (NOTE) This assay is not validated for testing neonatal or myeloproliferative syndrome specimens for Vitamin B12 levels. Performed at  [facility], [place] [address] [place], [place] [address]   Heparin level (unfractionated)     Status: None   Collection Time: 03/18/19 11:22 PM  Result Value Ref Range   Heparin Unfractionated 0.59 0.30 - 0.70 IU/mL    Comment: (NOTE) If heparin results are below expected values, and patient dosage has  been confirmed, suggest follow up testing of antithrombin III levels. Performed at [facility], [address] [place], [place] [address]   Creatinine, serum  Status: None   Collection Time: 03/19/19  5:21 AM  Result Value Ref Range   Creatinine, Ser 0.91 0.61 - 1.24 mg/dL   GFR calc non Af Amer >60 >60 mL/min   GFR calc Af Amer >60 >60 mL/min    Comment: Performed at [facility], [place], [place] [address]  Heparin level (unfractionated)     Status: None   Collection Time: 03/19/19  5:21 AM  Result Value Ref Range   Heparin Unfractionated 0.52 0.30 - 0.70 IU/mL    Comment: (NOTE) If heparin results are below expected values, and patient dosage has  been confirmed, suggest follow up testing of antithrombin III levels. Performed at [facility], [address] [place], [place] [address]   CBC     Status: Abnormal   Collection Time: 03/19/19  5:21 AM  Result Value Ref Range   WBC 18.5 (H) 4.0 - 10.5 K/uL   RBC 2.61 (L) 4.22 - 5.81 MIL/uL   Hemoglobin 8.9 (L) 13.0 - 17.0 g/dL   HCT 27.1 (L) 39.0 - 52.0 %   MCV 103.8 (H) 80.0 - 100.0 fL   MCH 34.1 (H) 26.0 - 34.0 pg   MCHC 32.8 30.0 - 36.0 g/dL   RDW 14.2 11.5 - 15.5 %   Platelets 252 150 - 400 K/uL   nRBC 0.2 0.0 - 0.2 %    Comment: Performed at [facility], [address] [place], [place] [address]    No results found.  Review of Systems  Unable to perform ROS: Mental status change  Constitutional: Positive for malaise/fatigue.  HENT: Positive for congestion.   Skin: Negative.    Blood pressure 137/88, pulse (!) 123, temperature 97.9 F (36.6  C), resp. rate 18, height 5\' 9"  (1.753 m), weight 49.2 kg, SpO2 100 %. Physical Exam  Nursing note and vitals reviewed. Constitutional: He appears well-developed and well-nourished.  HENT:  Head: Normocephalic and atraumatic.  Eyes: Pupils are equal, round, and reactive to light. Conjunctivae and EOM are normal.  Neck: Normal range of motion. Neck supple.  Respiratory: He has decreased breath sounds. He has rhonchi.  GI: Soft. Bowel sounds are normal.  Musculoskeletal:        General: Deformity present.  Psychiatric: Cognition and memory are impaired. He is noncommunicative.    Assessment/Plan: Altered mental status Sinus tachycardia Bedbound Peripheral vascular disease Contracted Urosepsis Alcohol abuse   Hypertension Anemia . Plan Agree with aggressive antibiotic therapy Maintain fluid resuscitation Agree with palliative care Continue to follow tachycardia Peripheral vascular disease we will follow-up with vascular Alcohol abuse in the process continue to advised to refrain from tobacco abuse Correct hypokalemia Continue hypertension management and control   Cassell Voorhies D Tiffney Haughton 03/19/2019, 1:41 PM

## 2019-03-19 NOTE — Plan of Care (Signed)
  Problem: Education: Goal: Knowledge of General Education information will improve Description: Including pain rating scale, medication(s)/side effects and non-pharmacologic comfort measures Outcome: Progressing   Problem: Safety: Goal: Ability to remain free from injury will improve Outcome: Progressing   Problem: Skin Integrity: Goal: Risk for impaired skin integrity will decrease Outcome: Progressing   

## 2019-03-19 NOTE — Progress Notes (Signed)
[facility] [place] NAME: Donald [name]    MR#:  [id number]  DATE OF BIRTH:  17-Jul-1961  SUBJECTIVE:  CHIEF COMPLAINT:   Chief Complaint  Patient presents with  . Altered Mental Status   Patient is pleasantly confused and tachycardic with heart rate fluctuating at around 120  trying to remove mittens Alert and oriented to self only.  REVIEW OF SYSTEMS:  Review of Systems  Unable to perform ROS: Acuity of condition  Respiratory: Negative for shortness of breath.   Cardiovascular: Negative for chest pain.    DRUG ALLERGIES:  No Known Allergies VITALS:  Blood pressure 137/88, pulse (!) 123, temperature 97.9 F (36.6 C), resp. rate 18, height 5\' 9"  (1.753 m), weight 49.2 kg, SpO2 100 %. PHYSICAL EXAMINATION:  Physical Exam Vitals signs reviewed.  Constitutional:      General: He is not in acute distress.    Appearance: He is ill-appearing. He is not toxic-appearing.     Comments: malnourished  HENT:     Head: Normocephalic and atraumatic.  Cardiovascular:     Rate and Rhythm: Normal rate and regular rhythm.     Comments: Some more color to BLLE with purple discoloration still present along toes Pulmonary:     Effort: Pulmonary effort is normal.     Breath sounds: Normal breath sounds.  Abdominal:     General: Abdomen is flat.     Palpations: Abdomen is soft.  Neurological:     General: No focal deficit present.     Comments: A&O to self only    LABORATORY PANEL:  Male CBC Recent Labs  Lab 03/19/19 0521  WBC 18.5*  HGB 8.9*  HCT 27.1*  PLT 252   ------------------------------------------------------------------------------------------------------------------ Chemistries  Recent Labs  Lab 03/13/19 0931  03/16/19 0557  03/18/19 0330 03/19/19 0521  NA 146*   < > 149*   < > 146*  --   K 3.1*   < > 3.6   < > 3.9  --   CL 110   < > 118*   < > 120*  --   CO2 21*   < > 21*   < > 19*  --   GLUCOSE 140*   < > 94   <  > 89  --   BUN 18   < > 17   < > 23*  --   CREATININE 0.72   < > 0.83   < > 0.89 0.91  CALCIUM 10.5*   < > 9.3   < > 8.7*  --   MG  --    < > 2.0  --   --   --   AST 86*  --   --   --   --   --   ALT 29  --   --   --   --   --   ALKPHOS 87  --   --   --   --   --   BILITOT 1.8*  --   --   --   --   --    < > = values in this interval not displayed.   RADIOLOGY:  No results found. ASSESSMENT AND PLAN:  DIVON KRABILL Jr.is a 58yo male with a PMH of PAD, h/o ETOH use disorder, COPD, HTN, BPH, h/o tobacco use disorder, h/o seizure, and protein-calorie malnutrition who presented with worsening AMS found to be septic.  1.  Acute  deliriumurosepsis, sepsis improved; Leukocytosis: pt remains afebrile but still altered - ID following, appreciate recommednations and patient restarted on cefepime and vanc - Leukocytosis believed to be 2/2 PAD and ischemia, improved after procedure; WBCs (26.4>23.2) - CXR with no signs of acute process, procalcitonin 0.53>0.34  -Blood cultures, NGx4d - Urine culturewith staphepidermidis, which is likely contaminate per ID - MRSA PCRpositive - IVFs and PRN bolus to keep MAP >60 per sepsis protocol - palliative care following, patient now DNR - may need to r/o Wernickes or Korasakoff's given continued AMS -we will check B12 thiamine and folate levels  2.Tachycardia -IV fluid bolus and continue IV fluids -Monitor patient telemetry - EKG-with sinus tachycardia seen by cardiology -Dr. Clayborn Bigness appreciate their recommendations -IV Lopressor as needed.  Toprol dose increased 200 mg we will continue close monitoring  3.PAD with ischemic feet BL s/p PCI 03/13/19, for revascularization on 03/16/2019 - Vascular surgeryfollowing-patient to go forsurgery for revascularization with possible femoral endarterectomy/extensive aortoiliac stenting -continueheparin gttper vascular sx  4. ETOH abuse - Recent hospitalizationforDTs -Noactivesigns of  withdrawal, CIWA monitoring - Check daily BMP+Mg- wnl 7/9 - Daily Folate, MVI;continue high-dose IV thiamine x3 days - PT/OT evaluation for mobility  5. Penile/ sacral (stage II)/ scrotal decubiti ulcerss/p revascularization  - woundcare consulted -Foley catheter in place to help keep the area dry - Likely due to the condom cath use in the previous hospitalization - ID has sent aerobic culture as well as HSV of ulcers  6. Hypokalemia Hypomagnesemia -Monitor K and mag, repleteas indicated  7.HTN-Goal BP <140/90 -OnClonidine, metoprolol and amlodipine - IV labetalol PRN  8.Severe, protein calorie malnutrition- likely 2/2 ETOH use disorder - nutritionfollowing  All the records are reviewed and case discussed with Care Management/Social Worker. Management plans discussed with the patient, family and they are in agreement.  CODE STATUS: DNR  POSSIBLE D/C IN 2-3 DAYS, DEPENDING ON CLINICAL CONDITION.  [name], DO PGY-3, [name]  03/19/2019 at 3:32 PM  Between 7am to 6pm - Pager - [phone]  After 6pm go to [email or web address] - Proofreader  [facility]  Office  [phone]  CC: Primary care physician; Olin Hauser, DO

## 2019-03-19 NOTE — Progress Notes (Signed)
Postop day 3, extensive femoral endarterectomy with patch angioplasty bilaterally, and stent reconstruction of aortoiliac segments.  Complaining about mittens this morning.  On exam, afebrile, vital signs stable, wound vacs in place, lower extremities warm and well-perfused with good capillary refill.  Minimal VAC drainage.  On aspirin, Plavix, heparin drip.  Vacs to be removed in a.m.  White count trending down, most recently 18. No change in current medication regimen.  Zara Chess, MD

## 2019-03-19 NOTE — Progress Notes (Signed)
Completed wound care (both order sets) at this time, as ordered Q shift and TID (for 4P). Patient tolerated fairly. Appreciate help of NT's Mykia and Arlisha. Dressing timed, dated, and initialed. Will continue to monitor wound dressing status. Will pass along in shift report. Donald [name] [facility]

## 2019-03-19 NOTE — Consult Note (Signed)
ANTICOAGULATION CONSULT NOTE   Pharmacy Consult for Heparin Indication: aortic occlusion  No Known Allergies  Patient Measurements: Height: 5\' 9"  (175.3 cm) Weight: 108 lb 7.5 oz (49.2 kg) IBW/kg (Calculated) : 70.7 Heparin Dosing Weight: 49.2  Vital Signs: Temp: 98.2 F (36.8 C) (07/11 1918) Temp Source: Oral (07/11 1918) BP: 134/78 (07/11 1918) Pulse Rate: 124 (07/11 1918)  Labs: Recent Labs    03/16/19 0557 03/17/19 0344  03/18/19 0330 03/18/19 0928 03/18/19 1647 03/18/19 2322  HGB 12.2* 11.0*  --  9.2*  --   --   --   HCT 37.5* 33.7*  --  28.4*  --   --   --   PLT 255 250  --  243  --   --   --   APTT 75*  --   --   --   --   --   --   LABPROT 15.9*  --   --   --   --   --   --   INR 1.3*  --   --   --   --   --   --   HEPARINUNFRC 0.40  --    < > 0.39 0.27* 0.47 0.59  CREATININE 0.83 0.90  --  0.89  --   --   --    < > = values in this interval not displayed.    Estimated Creatinine Clearance: 65.3 mL/min (by C-G formula based on SCr of 0.89 mg/dL).   Assessment: Pharmacy consulted for heparin. No DOAC PTA. When clinically appropriate, patient will transition to oral when clinically appropriate.   7/6 Heparin infusion started @ 750 units/hr  7/7 @ 1229 HL: 0.26. Level is subtherapeutic. 700 unit bolus and rate increased to 950 units/hr.  7/7 @ 1857 HL: 0.53. Level is therapeutic.  7/8 @ 0111 HL: 0.37, aPTT: 68, Both levels are therapeutic. 7/8 @ 0756 HL: 0.38.  Level is therapeutic x 2.  7/9 @ 0557 aPTT: 75, HL: 0.40. Both levels are therapeutic and correlating.  7/10 @1340  HL: 0.38. Therapeutic s/p resuming dose after being stopped at 1623 yesterday and restarted today at 0700.  07/10 @ 2002 HL undetectable. Heparin drip stopped at 1730 due to line issue 7/11 @0330  HL 0.39 continue rate at 950  7/11 @0928  HL 0.27 will increase rate to 1050 units/hr, no bolus.  7/11 @1647  HL 0.47 therapeutic x 1  Goal of Therapy:  Heparin level 0.3-0.7 units/ml Monitor  platelets by anticoagulation protocol: Yes   Plan:  4709 @ 2322 HL: 0.59. Level therapeutic x 2.  Will continue current heparin infusion rate at 1050 units/hr.   Hgb: 11>> 9.2- continue to monitor.  Continue to check daily HL and CBC per protocol.   Pernell Dupre, PharmD, BCPS Clinical Pharmacist 03/19/2019 12:57 AM

## 2019-03-19 NOTE — Plan of Care (Signed)
  Problem: Education: Goal: Knowledge of General Education information will improve Description: Including pain rating scale, medication(s)/side effects and non-pharmacologic comfort measures Outcome: Not Progressing Note: Patient w/ significant confusion / delirium today, as has been the case for at least the past two days. Thus cannot achieve this particular goal today. Patient's mental status doesn't seem to be improving. Palliative care following. Multiple wounds, now w/ 2 wound vacuums. Will continue to monitor neurological status. Wenda [facility]

## 2019-03-20 LAB — CBC
HCT: 27.8 % — ABNORMAL LOW (ref 39.0–52.0)
Hemoglobin: 9 g/dL — ABNORMAL LOW (ref 13.0–17.0)
MCH: 34.4 pg — ABNORMAL HIGH (ref 26.0–34.0)
MCHC: 32.4 g/dL (ref 30.0–36.0)
MCV: 106.1 fL — ABNORMAL HIGH (ref 80.0–100.0)
Platelets: 289 10*3/uL (ref 150–400)
RBC: 2.62 MIL/uL — ABNORMAL LOW (ref 4.22–5.81)
RDW: 14.5 % (ref 11.5–15.5)
WBC: 19.4 10*3/uL — ABNORMAL HIGH (ref 4.0–10.5)
nRBC: 0.4 % — ABNORMAL HIGH (ref 0.0–0.2)

## 2019-03-20 LAB — BASIC METABOLIC PANEL
Anion gap: 7 (ref 5–15)
BUN: 22 mg/dL — ABNORMAL HIGH (ref 6–20)
CO2: 19 mmol/L — ABNORMAL LOW (ref 22–32)
Calcium: 8.8 mg/dL — ABNORMAL LOW (ref 8.9–10.3)
Chloride: 121 mmol/L — ABNORMAL HIGH (ref 98–111)
Creatinine, Ser: 0.97 mg/dL (ref 0.61–1.24)
GFR calc Af Amer: >60 mL/min (ref >60)
GFR calc non Af Amer: >60 mL/min (ref >60)
Glucose, Bld: 101 mg/dL — ABNORMAL HIGH (ref 70–99)
Potassium: 4.7 mmol/L (ref 3.5–5.1)
Sodium: 147 mmol/L — ABNORMAL HIGH (ref 135–145)

## 2019-03-20 LAB — HSV CULTURE AND TYPING

## 2019-03-20 LAB — HEPARIN LEVEL (UNFRACTIONATED): Heparin Unfractionated: 0.66 IU/mL (ref 0.30–0.70)

## 2019-03-20 MED ORDER — METOPROLOL SUCCINATE ER 50 MG PO TB24
150.0000 mg | ORAL_TABLET | Freq: Every day | ORAL | Status: DC
  Administered 2019-03-21: 150 mg via ORAL
  Filled 2019-03-20: qty 1

## 2019-03-20 MED ORDER — METOPROLOL SUCCINATE ER 50 MG PO TB24
50.0000 mg | ORAL_TABLET | Freq: Every day | ORAL | Status: DC
  Administered 2019-03-20 – 2019-03-23 (×4): 50 mg via ORAL
  Filled 2019-03-20 (×5): qty 1

## 2019-03-20 MED ORDER — VANCOMYCIN HCL IN DEXTROSE 1-5 GM/200ML-% IV SOLN
1000.0000 mg | INTRAVENOUS | Status: AC
  Administered 2019-03-21 – 2019-03-22 (×2): 1000 mg via INTRAVENOUS
  Filled 2019-03-20 (×2): qty 200

## 2019-03-20 NOTE — Progress Notes (Addendum)
[facility] Daily Progress Note   Subjective: 4 Days Post-Op: 1. Left common femoral, profunda femoris, and superficial femoral artery endarterectomies 2. Right common femoral, profunda femoris, and superficial femoral artery endarterectomies 3. Catheter placement into aorta from bilateral femoral approach 4.Bilateral external iliac stent placements with 8 mm diameter by 10 cm length via bond stents 5.Bilateral common iliac stent placements with 8 mm diameter by 58 mm length lifestream stents 6.4 stent placements to the aorta to realign both iliac arteries up to the base of both renal arteries usingtwo9 mm diameter by 58 mm length lifestream stents and two 10 mm diameter by 58 mm length lifestream stents  Patient is a poor historian.  Seems to be comfortable this a.m.  Objective: Vitals:   03/19/19 1927 03/19/19 2039 03/20/19 0527 03/20/19 0722  BP:  132/67 131/84 139/81  Pulse:  (!) 133 (!) 118 (!) 116  Resp:    19  Temp:  98.4 F (36.9 C) 98.1 F (36.7 C) 98.1 F (36.7 C)  TempSrc:  Oral Oral   SpO2: 97% 100% 100%   Weight:      Height:        Intake/Output Summary (Last 24 hours) at 03/20/2019 1345 Last data filed at 03/19/2019 1700 Gross per 24 hour  Intake -  Output 1000 ml  Net -1000 ml   Physical Exam: A&Ox1, NAD CV: RRR Pulmonary: CTA Bilaterally Abdomen: Soft, Nontender, Nondistended Right Groin: VAC removed. Incision: Clean dry and intact.  Document Information Photos    03/20/2019 10:10  Attached To:  Hospital Encounter on 03/13/19  Source Information Eric Nees, Janalyn Harder, PA-C  [facility]-Telemetry (2a)   Left Groin: VAC removed.  Incision clean dry intact  Document Information Photos    03/20/2019 10:10  Attached To:  Hospital Encounter on 03/13/19  Source Information Kimerly Rowand, Janalyn Harder, PA-C  [facility]-Telemetry (2a)   Vascular: Right foot: Warm, however with discolored third toe   Document Information  Photos    03/20/2019 10:11  Attached To:  Hospital Encounter on 03/13/19  Source Information Starlene Consuegra, Janalyn Harder, PA-C  [facility]-Telemetry (2a)    Laboratory: CBC    Component Value Date/Time   WBC 19.4 (H) 03/20/2019 0459   HGB 9.0 (L) 03/20/2019 0459   HGB 14.9 07/31/2015 1117   HCT 27.8 (L) 03/20/2019 0459   HCT 42.5 07/31/2015 1117   PLT 289 03/20/2019 0459   PLT 161 07/31/2015 1117   BMET    Component Value Date/Time   NA 147 (H) 03/20/2019 0459   NA 125 (L) 02/07/2016 1305   NA 130 (L) 11/20/2011 0510   K 4.7 03/20/2019 0459   K 4.0 11/20/2011 0510   CL 121 (H) 03/20/2019 0459   CL 98 11/20/2011 0510   CO2 19 (L) 03/20/2019 0459   CO2 18 (L) 11/20/2011 0510   GLUCOSE 101 (H) 03/20/2019 0459   GLUCOSE 82 11/20/2011 0510   BUN 22 (H) 03/20/2019 0459   BUN 5 (L) 02/07/2016 1305   BUN 1 (L) 11/20/2011 0510   CREATININE 0.97 03/20/2019 0459   CREATININE 0.93 01/05/2019 0800   CALCIUM 8.8 (L) 03/20/2019 0459   CALCIUM 8.5 11/20/2011 0510   GFRNONAA >60 03/20/2019 0459   GFRNONAA 92 01/05/2019 0800   GFRAA >60 03/20/2019 0459   GFRAA 107 01/05/2019 0800   Assessment/Planning: The patient is a 58 year old male status post bilateral lower extremity femoral endarterectomies with stent placement postop day #4 1) Groin VACs removed.  Bilateral groin incisions  clean dry and intact 2) right foot is warm however there is a discoloration to the third toe.  Possibly consult podiatry for the recommendations.  See above picture.  Discussed with Dr. Ellis Parents Delisa Finck PA-C 03/20/2019 1:45 PM

## 2019-03-20 NOTE — Consult Note (Signed)
[place] for Heparin Indication: aortic occlusion  No Known Allergies  Patient Measurements: Height: 5\' 9"  (175.3 cm) Weight: 108 lb 7.5 oz (49.2 kg) IBW/kg (Calculated) : 70.7 Heparin Dosing Weight: 49.2  Vital Signs: Temp: 98.1 F (36.7 C) (07/13 0527) Temp Source: Oral (07/13 0527) BP: 131/84 (07/13 0527) Pulse Rate: 118 (07/13 0527)  Labs: Recent Labs    03/18/19 0330  03/18/19 2322 03/19/19 0521 03/20/19 0459  HGB 9.2*  --   --  8.9* 9.0*  HCT 28.4*  --   --  27.1* 27.8*  PLT 243  --   --  252 289  HEPARINUNFRC 0.39   < > 0.59 0.52 0.66  CREATININE 0.89  --   --  0.91 0.97   < > = values in this interval not displayed.    Estimated Creatinine Clearance: 59.9 mL/min (by C-G formula based on SCr of 0.97 mg/dL).   Assessment: Pharmacy consulted for heparin. No DOAC PTA. When clinically appropriate, patient will transition to oral when clinically appropriate.   7/6 Heparin infusion started @ 750 units/hr  7/7 @ 1229 HL: 0.26. Level is subtherapeutic. 700 unit bolus and rate increased to 950 units/hr.  7/7 @ 1857 HL: 0.53. Level is therapeutic.  7/8 @ 0111 HL: 0.37, aPTT: 68, Both levels are therapeutic. 7/8 @ 0756 HL: 0.38.  Level is therapeutic x 2.  7/9 @ 0557 aPTT: 75, HL: 0.40. Both levels are therapeutic and correlating.  7/10 @1340  HL: 0.38. Therapeutic s/p resuming dose after being stopped at 1623 yesterday and restarted today at 0700.  07/10 @ 2002 HL undetectable. Heparin drip stopped at 1730 due to line issue 7/11 @0330  HL 0.39 continue rate at 950  7/11 @0928  HL 0.27 will increase rate to 1050 units/hr, no bolus.  7/11 @1647  HL 0.47 therapeutic x 1 0711 @ 2322 HL: 0.59. Level therapeutic x 2.  6468 @ 0521 HL: 0.52. Level remains therapeutic  Goal of Therapy:  Heparin level 0.3-0.7 units/ml Monitor platelets by anticoagulation protocol: Yes   Plan:  0321 @ 0459 HL: 0.66. Level remains therapeutic Will  continue current heparin infusion rate at 1050 units/hr.    Hgb: 11>> 9.2>> 8.9>> 9.0- continue to monitor.  Continue to check daily HL and CBC per protocol.   Pernell Dupre, PharmD, BCPS Clinical Pharmacist 03/20/2019 6:23 AM

## 2019-03-20 NOTE — Progress Notes (Addendum)
Initial Nutrition Assessment  RD working remotely.  DOCUMENTATION CODES:   Severe malnutrition in context of chronic illness  INTERVENTION:   Nepro Shake po BID, each supplement provides 425 kcal and 19 grams protein  Magic cup TID with meals, each supplement provides 290 kcal and 9 grams of protein  Continue thiamine, MVI and folic acid daily in setting of etoh abuse.  Vitamin C 250mg  po BID  Monitor potassium, magnesium, and phosphorus as patient is at risk for refeeding syndrome.  NUTRITION DIAGNOSIS:   Severe Malnutrition related to chronic illness(emphysema, etoh abuse) as evidenced by severe muscle depletion, severe fat depletion.  GOAL:   Patient will meet greater than or equal to 90% of their needs -progressing   MONITOR:   PO intake, Supplement acceptance, Labs, Weight trends, Skin, I & O's  ASSESSMENT:   58 y.o. male with pertinent past medical history of emphysema, hypertension, BPH, former tobacco abuse, alcohol abuse, peripheral neuropathy, PVD, anemia, sensory ataxia, colon cancer, depression, hypertension, and seizure presenting to the ED with altered mental status.   Pt with increasing confusion/delirium. Pt continues to have poor appetite and oral intake. Pt is drinking some Ensure but refusing most of it. Pt is at high refeed risk. Pt would not likely tolerate NGT placement for tube feeds. Palliative care consult pending. RD will monitor for GOC. No new weight since 7/6; will request weekly weights.   Medications reviewed and include: aspirin, D3, plavix, folic acid, megace, MVI, KCl, thiamine, NaCl @75ml /hr, cefepime, heparin, vancomycin   Labs reviewed: Na 147(H), K 4.7 wnl Wbc- 19.4(H), Hgb 9.0(L), Hct 27.8(L), MCV 106.1(H), MCH 34.4(H) Mg 1.9 wnl- 7/9  P 2.6 wnl- 7/7 Wbc- 22.0(H), MCH 34.2(H) Vitamin D 24.2(L)- 7/7 Iron 87, TIBC 235(L), ferritin 683(H), transferrin 191- 7/7 folate 15.7- 7/11 B12 357- 7/11  Diet Order:   Diet Order           DIET DYS 3 Room service appropriate? Yes; Fluid consistency: Thin  Diet effective now             EDUCATION NEEDS:   Education needs have been addressed  Skin:  Skin Assessment: Reviewed RN Assessment(Coccyx: 1cm x 0.5cm x 0.2cm with wound bed partially obscured by the presence of yellow fibrinous slough. Stage 3, incision groin) VAC  Last BM:  7/10  Height:   Ht Readings from Last 1 Encounters:  03/13/19 5\' 9"  (1.753 m)   Weight:   Wt Readings from Last 1 Encounters:  03/13/19 49.2 kg   Ideal Body Weight:  72.7 kg  BMI:  Body mass index is 16.02 kg/m.  Estimated Nutritional Needs:   Kcal:  1700-2000kcal/day  Protein:  85-100g/day  Fluid:  >1.5L/day  Koleen Distance MS, RD, LDN Pager #- [phone] Office#- [phone] After Hours Pager: [phone]

## 2019-03-20 NOTE — Consult Note (Addendum)
Pharmacy Antibiotic Note  Donald [name]. is a 58 y.o. male admitted on 03/13/2019 with leukocytosis, penis wound.  Pharmacy has been consulted for vancomycin dosing. Also on cefepime for wound infection.   Plan: Day4  of vancomycin.  Will start vancomycin 1000 mg q24H. Predicted AUC 525. Goal AUC 400-550. Plan to order levels for tomorrow. Monitor renal function. Used TBW as IBW > TBW.   Height: 5\' 9"  (175.3 cm) Weight: 108 lb 7.5 oz (49.2 kg) IBW/kg (Calculated) : 70.7  Temp (24hrs), Avg:98.1 F (36.7 C), Min:97.9 F (36.6 C), Max:98.4 F (36.9 C)  Recent Labs  Lab 03/13/19 1039 03/13/19 1207  03/14/19 0847  03/16/19 0557 03/17/19 0344 03/18/19 0330 03/19/19 0521 03/20/19 0459  WBC  --   --    < >  --    < > 26.2* 23.2* 23.0* 18.5* 19.4*  CREATININE  --   --    < >  --    < > 0.83 0.90 0.89 0.91 0.97  LATICACIDVEN 2.3* 2.3*  --  1.6  --   --   --   --   --   --    < > = values in this interval not displayed.    Estimated Creatinine Clearance: 59.9 mL/min (by C-G formula based on SCr of 0.97 mg/dL).    No Known Allergies  Antimicrobials this admission: 7/6 cefepime >>  7/9 vancomcyin >>   Dose adjustments this admission: None  Microbiology results: 7/6 BCx: pending 7/6 UCx:  STAPHYLOCOCCUS EPIDERMIDIS 7/6 MRSA PCR: positive  7/9 wound culture: pending  Thank you for allowing pharmacy to be a part of this patient's care.  Oswald Hillock, PharmD, BCPS Clinical Pharmacist  03/20/2019 8:12 AM

## 2019-03-20 NOTE — Progress Notes (Signed)
PT Cancellation Note  Patient Details Name: Donald [name]. MRN: [id number] DOB: 1961-07-17   Cancelled Treatment:    Reason Eval/Treat Not Completed: Patient declined, no reason specified;Other (comment). Upon arrival, pt noted to have trunk hanging out of bed with RUE on floor and head nearly to floor (bed in lowest position) Mitts on and IV was tangled around pt and taught. Attempted to help pt and discourage from exiting bed. Pt hyper verbal with little comprehensible verbiage. Explanation provided to pt that therapist was going to assist with IV, so that it did not pull out; pt was placing trunk slowly back into bed with use of rails. As therapist approached pt to assist, pt swung LUE at therapist stating "NO' several times. No further assist/therapy attempts. Bed alarm switched from low to moderate and nursing notified of events. Re attempt PT at a later date.    Larae Grooms, PTA 03/20/2019, 3:38 PM

## 2019-03-20 NOTE — Progress Notes (Signed)
OT Cancellation Note  Patient Details Name: Donald [name]. MRN: [id number] DOB: Nov 21, 1960   Cancelled Treatment:    Reason Eval/Treat Not Completed: Other (comment). Pt confused, inappropriate for OT tx this date. Will re-attempt at later date/time as appropriate.   Jeni Salles, MPH, MS, OTR/L ascom [phone] 03/20/19, 4:00 PM

## 2019-03-20 NOTE — Progress Notes (Signed)
Speech Therapy note: reviewed chart notes; ST services consulted NSG who reported concern for aspiration w/ po's (liquids primarily). Due to pt's currently declined Cognitive status and need for medication for agitation/confusion, ST services will hold on evaluation. Recommended diet modification d/t increased risk for dysphagia, aspiration at this time until ST services can f/u tomorrow w/ BSE.    Orinda Kenner, [place], CCC-SLP

## 2019-03-20 NOTE — Progress Notes (Signed)
[facility] [place] NAME: Donald [name]    MR#:  [id number]  DATE OF BIRTH:  1960-12-19  SUBJECTIVE:  CHIEF COMPLAINT:   Chief Complaint  Patient presents with  . Altered Mental Status   Patient is pleasantly confused and with relatively improved heart rate  Alert and oriented to self only.  REVIEW OF SYSTEMS:  Review of Systems  Unable to perform ROS: Acuity of condition  Respiratory: Negative for shortness of breath.   Cardiovascular: Negative for chest pain.    DRUG ALLERGIES:  No Known Allergies VITALS:  Blood pressure 139/81, pulse (!) 116, temperature 98.1 F (36.7 C), resp. rate 19, height 5\' 9"  (1.753 m), weight 49.2 kg, SpO2 100 %. PHYSICAL EXAMINATION:  Physical Exam Vitals signs reviewed.  Constitutional:      General: He is not in acute distress.    Appearance: He is ill-appearing. He is not toxic-appearing.     Comments: malnourished  HENT:     Head: Normocephalic and atraumatic.  Cardiovascular:     Rate and Rhythm: Normal rate and regular rhythm.     Comments: Some more color to BLLE with purple discoloration still present along toes Pulmonary:     Effort: Pulmonary effort is normal.     Breath sounds: Normal breath sounds.  Abdominal:     General: Abdomen is flat.     Palpations: Abdomen is soft.  Neurological:     General: No focal deficit present.     Comments: A&O to self only    LABORATORY PANEL:  Male CBC Recent Labs  Lab 03/20/19 0459  WBC 19.4*  HGB 9.0*  HCT 27.8*  PLT 289   ------------------------------------------------------------------------------------------------------------------ Chemistries  Recent Labs  Lab 03/16/19 0557  03/20/19 0459  NA 149*   < > 147*  K 3.6   < > 4.7  CL 118*   < > 121*  CO2 21*   < > 19*  GLUCOSE 94   < > 101*  BUN 17   < > 22*  CREATININE 0.83   < > 0.97  CALCIUM 9.3   < > 8.8*  MG 2.0  --   --    < > = values in this interval not displayed.    RADIOLOGY:  No results found. ASSESSMENT AND PLAN:  CASSIAN TORELLI Jr.is a 58yo male with a PMH of PAD, h/o ETOH use disorder, COPD, HTN, BPH, h/o tobacco use disorder, h/o seizure, and protein-calorie malnutrition who presented with worsening AMS found to be septic.  1.  Acute deliriumurosepsis, sepsis improved; Leukocytosis: pt remains afebrile but still altered - ID following, appreciate recommednations and patient restarted on cefepime and vanc - Leukocytosis believed to be 2/2 PAD and ischemia, improved after procedure; WBCs (26.4>23.2) - CXR with no signs of acute process, procalcitonin 0.53>0.34  -Blood cultures, NGx4d - Urine culturewith staphepidermidis, which is likely contaminate per ID - MRSA PCRpositive - IVFs and PRN bolus to keep MAP >60 per sepsis protocol - palliative care following, patient now DNR -B12 and folate levels are normal -Neurology consult -secure haiku chat message sent to Dr. Doy Mince  2.Tachycardia -IV fluid bolus and continue IV fluids -Monitor patient telemetry - EKG-with sinus tachycardia seen by cardiology -Dr. Clayborn Bigness appreciate their recommendations -IV Lopressor as needed.  Toprol dose increased 200 mg we will continue close monitoring  3.PAD with ischemic feet BL s/p PCI 03/13/19, for revascularization on 03/16/2019 - Vascular surgeryfollowing-patient to go forsurgery  for revascularization with possible femoral endarterectomy/extensive aortoiliac stenting -continueheparin gttper vascular sx -Wound vacs from the the groin removed   4. ETOH abuse - Recent hospitalizationforDTs -Noactivesigns of withdrawal, CIWA monitoring - Check daily BMP+Mg- wnl 7/9 - Daily Folate, MVI;continued high-dose IV thiamine x3 days - PT/OT evaluation for mobility-  5. Penile/ sacral (stage II)/ scrotal decubiti ulcerss/p revascularization  - woundcare consulted -Foley catheter in place to help keep the area dry - Likely due to  the condom cath use in the previous hospitalization - ID has sent aerobic culture as well as HSV of ulcers  6. Hypokalemia Hypomagnesemia -Monitor K and mag, repleteas indicated  7.HTN-Goal BP <140/90 -OnClonidine, metoprolol and amlodipine - IV labetalol PRN  8.Severe, protein calorie malnutrition- likely 2/2 ETOH use disorder - nutritionfollowing  9.  Right foot third toe discoloration-podiatry consult placed  Heparin drip   Disposition skilled nursing facility  All the records are reviewed and case discussed with Care Management/Social Worker. Management plans discussed with the patient, family and they are in agreement.  CODE STATUS: DNR  POSSIBLE D/C IN 2-3 DAYS, DEPENDING ON CLINICAL CONDITION.  [name], DO PGY-3, [name]  03/20/2019 at 2:12 PM  Between 7am to 6pm - Pager - [phone]  After 6pm go to [email or web address] - Proofreader  [facility]  Office  [phone]  CC: Primary care physician; Olin Hauser, DO

## 2019-03-20 NOTE — Plan of Care (Signed)
  Problem: Education: Goal: Knowledge of General Education information will improve Description Including pain rating scale, medication(s)/side effects and non-pharmacologic comfort measures Outcome: Progressing   Problem: Clinical Measurements: Goal: Respiratory complications will improve Outcome: Progressing   Problem: Safety: Goal: Ability to remain free from injury will improve Outcome: Progressing   Problem: Skin Integrity: Goal: Risk for impaired skin integrity will decrease Outcome: Progressing   

## 2019-03-21 ENCOUNTER — Inpatient Hospital Stay: Payer: Medicaid Other

## 2019-03-21 ENCOUNTER — Other Ambulatory Visit: Payer: Medicaid Other

## 2019-03-21 DIAGNOSIS — R4182 Altered mental status, unspecified: Secondary | ICD-10-CM

## 2019-03-21 DIAGNOSIS — N390 Urinary tract infection, site not specified: Secondary | ICD-10-CM

## 2019-03-21 LAB — CBC
HCT: 28.2 % — ABNORMAL LOW (ref 39.0–52.0)
Hemoglobin: 9.1 g/dL — ABNORMAL LOW (ref 13.0–17.0)
MCH: 34.2 pg — ABNORMAL HIGH (ref 26.0–34.0)
MCHC: 32.3 g/dL (ref 30.0–36.0)
MCV: 106 fL — ABNORMAL HIGH (ref 80.0–100.0)
Platelets: 372 10*3/uL (ref 150–400)
RBC: 2.66 MIL/uL — ABNORMAL LOW (ref 4.22–5.81)
RDW: 14.7 % (ref 11.5–15.5)
WBC: 17.2 10*3/uL — ABNORMAL HIGH (ref 4.0–10.5)
nRBC: 0.3 % — ABNORMAL HIGH (ref 0.0–0.2)

## 2019-03-21 LAB — CREATININE, SERUM
Creatinine, Ser: 0.94 mg/dL (ref 0.61–1.24)
GFR calc Af Amer: >60 mL/min (ref >60)
GFR calc non Af Amer: >60 mL/min (ref >60)

## 2019-03-21 LAB — HEPARIN LEVEL (UNFRACTIONATED): Heparin Unfractionated: 0.84 IU/mL — ABNORMAL HIGH (ref 0.30–0.70)

## 2019-03-21 LAB — SURGICAL PATHOLOGY

## 2019-03-21 MED ORDER — SODIUM CHLORIDE 0.9% FLUSH: 10.0000 mL | INTRAVENOUS | Status: DC | PRN

## 2019-03-21 MED ORDER — DILTIAZEM HCL 30 MG PO TABS
60.0000 mg | ORAL_TABLET | Freq: Three times a day (TID) | ORAL | Status: DC
  Administered 2019-03-21 – 2019-03-24 (×7): 60 mg via ORAL
  Filled 2019-03-21 (×8): qty 2

## 2019-03-21 MED ORDER — APIXABAN 5 MG PO TABS
5.0000 mg | ORAL_TABLET | Freq: Two times a day (BID) | ORAL | Status: DC
  Administered 2019-03-21 – 2019-03-24 (×6): 5 mg via ORAL
  Filled 2019-03-21 (×6): qty 1

## 2019-03-21 MED ORDER — SODIUM CHLORIDE 0.45 % IV SOLN
INTRAVENOUS | Status: DC
  Administered 2019-03-21 – 2019-03-24 (×6): via INTRAVENOUS

## 2019-03-21 MED ORDER — SODIUM CHLORIDE 0.9% FLUSH
10.0000 mL | Freq: Two times a day (BID) | INTRAVENOUS | Status: DC
  Administered 2019-03-21 – 2019-03-24 (×7): 10 mL

## 2019-03-21 MED ORDER — METOPROLOL SUCCINATE ER 100 MG PO TB24
100.0000 mg | ORAL_TABLET | Freq: Every day | ORAL | Status: DC
  Administered 2019-03-22 – 2019-03-23 (×2): 100 mg via ORAL
  Administered 2019-03-24: 50 mg via ORAL
  Filled 2019-03-21 (×3): qty 1

## 2019-03-21 MED ORDER — NEPRO/CARBSTEADY PO LIQD
237.0000 mL | Freq: Two times a day (BID) | ORAL | Status: DC
  Administered 2019-03-22 – 2019-03-24 (×4): 237 mL via ORAL

## 2019-03-21 NOTE — Evaluation (Addendum)
Clinical/Bedside Swallow Evaluation Patient Details  Name: Donald [name]. MRN: [id number] Date of Birth: [birth year]  Today's Date: 03/21/2019 Time: SLP Start Time (ACUTE ONLY): 4562 SLP Stop Time (ACUTE ONLY): 1015 SLP Time Calculation (min) (ACUTE ONLY): 50 min  Past Medical History:  Past Medical History:  Diagnosis Date  . Depression   . Emphysema of lung ([place])   . Essential hypertension   . History of chicken pox   . Low weight   . Neuropathy    bilateral lower legs  . Peripheral vascular disease ([place])   . Prostate disease   . Seasonal allergies   . Seizures ([place])   . Wears dentures    full upper   Past Surgical History:  Past Surgical History:  Procedure Laterality Date  . ABDOMINAL AORTIC ENDOVASCULAR STENT GRAFT Bilateral 03/16/2019   Procedure: AORTIC ILIAC STENT;  Surgeon: Algernon Huxley, MD;  Location: [facility];  Service: Vascular;  Laterality: Bilateral;  . CATARACT EXTRACTION W/PHACO Left 05/18/2018   Procedure: CATARACT EXTRACTION PHACO AND INTRAOCULAR LENS PLACEMENT ([place]) LEFT;  Surgeon: Leandrew Koyanagi, MD;  Location: [facility];  Service: Ophthalmology;  Laterality: Left;  . COLONOSCOPY WITH PROPOFOL N/A 02/25/2016   Procedure: COLONOSCOPY WITH PROPOFOL;  Surgeon: [name], MD;  Location: [facility];  Service: Endoscopy;  Laterality: N/A;  . ENDARTERECTOMY FEMORAL Bilateral 03/16/2019   Procedure: ENDARTERECTOMY FEMORAL;  Surgeon: Algernon Huxley, MD;  Location: [facility];  Service: Vascular;  Laterality: Bilateral;  . EYE SURGERY  2015   Cataract  . LOWER EXTREMITY ANGIOGRAPHY Left 03/13/2019   Procedure: Lower Extremity Angiography (LEFT);  Surgeon: Algernon Huxley, MD;  Location: [facility];  Service: Cardiovascular;  Laterality: Left;  Marland Kitchen VASCULAR SURGERY     "7 stents in legs"   HPI:  Pt is a 58 y.o. male with pertinent past medical history including Severe Malnutrition related to chronic illness(emphysema, etoh abuse) as evidenced by  severe muscle depletion, severe fat depletion. hypertension, BPH, former tobacco abuse, alcohol abuse, peripheral neuropathy, PVD, anemia, sensory ataxia, colon cancer, depression, hypertension, and seizure presenting to the ED with altered mental status.  Pt had a recent admission last month when he was brought to the ED after EMS found him laying on his porch front of his house.  Neighbors had seen him and called EMS; pt was confused and significantly tachycardic.  Per PCP notes, patient's mother called with concerns about the patient being disoriented. She stated patient has had poor p.o. intake since last admission for delirium tremens with worsening over the past 3 days. She reported patient is currently only taking a few sips of water and small amount of food. His mother also states that he has been up all night hallucinating, and talking to himself. Patient normally uses crutches to ambulate.  Pt currently mumbles w/ mostly incoherent speech; max cues for follow through w/ po trials. He is cachectic-appearing; contracted in bed.  Pt has been admitted for several days; Vascular following post procedures of ENDARTERECTOMY FEMORAL (Bilateral) and Stent placements (Bilateral).  Pt appears to present w/ Cognitive decline at this evaluation.    Assessment / Plan / Recommendation Clinical Impression  Pt appears to present w/ oropharyngeal phase dysphagia in light of Mod-Severe decline in Cognitive and medical status' as well as overall awareness/attention to task of oral intake. Pt required Max feeding assistance and consistent, moderate tactile/verbal cues w/ each trial fed to him and direct him to task. Pt needed full  positioning and support upright in bed; pt is contracted w/ knees drawn. Pt given TSP tactile cues at lips, verbal cues for acceptance of trials. He consumed TSP trials of Nectar consistency liquids, purees w/ no immediate, overt s/s of aspiration noted; no decline in phonations/mumbled speech  and no decline in respiratory status during/post trials. Oral phase min slower as he manipulated the puree(increased texture), but he was able to clear orally given time - also alternated foods/Nectar liquids. TSPs of Nectar appeared to clear the oral phase most easily. Pt required full feeding support, cues. OM (general) weakness noted but he was able to orally maintain the boluses and clear them.  Due to pt's presentation, recommend a dysphagia level 1 (PUREE) diet w/ NECTAR consistency liquids; aspiration precautions; Pills in Puree - Crushed as able for safer swallowing. Full feeding assistance at meals using TSP, small single sips of Nectar liquids. 100% supervision w/ all oral intake checking for oral clearing post intake. Pt's medical and Cognitive status' appear so declined which can significantly impact safety of swallowing at this time.  SLP Visit Diagnosis: Dysphagia, oropharyngeal phase (R13.12)(declined Cognitive status)    Aspiration Risk  Moderate aspiration risk;Risk for inadequate nutrition/hydration    Diet Recommendation  Dysphagia level 1 (puree) w/ NECTAR consistency liquids; aspiration precautions; feeding support and Supervision w/ all oral intake  Medication Administration: Crushed with puree    Other  Recommendations Recommended Consults: (Dietician f/u; Palliative care consult placed) Oral Care Recommendations: Oral care BID;Staff/trained caregiver to provide oral care Other Recommendations: Order thickener from pharmacy;Prohibited food (jello, ice cream, thin soups);Remove water pitcher;Have oral suction available   Follow up Recommendations Skilled Nursing facility(TBD)      Frequency and Duration min 2x/week  2 weeks       Prognosis Prognosis for Safe Diet Advancement: Guarded Barriers to Reach Goals: Cognitive deficits;Time post onset;Severity of deficits      Swallow Study   General Date of Onset: 03/13/19 HPI: Pt is a 58 y.o. male with pertinent past  medical history including Severe Malnutrition related to chronic illness(emphysema, etoh abuse) as evidenced by severe muscle depletion, severe fat depletion. hypertension, BPH, former tobacco abuse, alcohol abuse, peripheral neuropathy, PVD, anemia, sensory ataxia, colon cancer, depression, hypertension, and seizure presenting to the ED with altered mental status.  Pt had a recent admission last month when he was brought to the ED after EMS found him laying on his porch front of his house.  Neighbors had seen him and called EMS; pt was confused and significantly tachycardic.  Per PCP notes, patient's mother called with concerns about the patient being disoriented. She stated patient has had poor p.o. intake since last admission for delirium tremens with worsening over the past 3 days. She reported patient is currently only taking a few sips of water and small amount of food. His mother also states that he has been up all night hallucinating, and talking to himself. Patient normally uses crutches to ambulate.  Pt currently mumbles w/ mostly incoherent speech; max cues for follow through w/ po trials. He is cachectic-appearing; contracted in bed.  Pt has been admitted for several days; Vascular following post procedures of ENDARTERECTOMY FEMORAL (Bilateral) and Stent placements (Bilateral).  Pt appears to present w/ Cognitive decline at this evaluation.  Type of Study: Bedside Swallow Evaluation Previous Swallow Assessment: none reported Diet Prior to this Study: Thin liquids;Dysphagia 3 (soft)(modified to Nectar liquids yest. until this BSE ) Temperature Spikes Noted: No(wbc 17.2 down from 26 in  past days) Respiratory Status: Room air History of Recent Intubation: No Behavior/Cognition: Cooperative;Confused;Distractible;Requires cueing;Doesn't follow directions(mumbled speech) Oral Cavity Assessment: Dry Oral Care Completed by SLP: Recent completion by staff Oral Cavity - Dentition: Missing dentition;Poor  condition Vision: (n/a) Self-Feeding Abilities: Total assist Patient Positioning: Postural control adequate for testing(contracted on R side) Baseline Vocal Quality: Low vocal intensity(mumbled speech) Volitional Cough: Cognitively unable to elicit Volitional Swallow: Unable to elicit    Oral/Motor/Sensory Function Overall Oral Motor/Sensory Function: Generalized oral weakness(adequate bolus control when presented/fed) Facial Symmetry: Within Functional Limits   Ice Chips Ice chips: Not tested   Thin Liquid Thin Liquid: Not tested Other Comments: d/t Cognitive status    Nectar Thick Nectar Thick Liquid: Within functional limits Presentation: Spoon(fed; 10+ trials) Other Comments: tactile/verbal cues given each trial   Honey Thick Honey Thick Liquid: Not tested   Puree Puree: Within functional limits Presentation: Spoon(fed; 10 trials) Other Comments: tactile/verbal cues given each trial   Solid     Solid: Not tested Other Comments: d/t Cognitive status       Orinda Kenner, MS, CCC-SLP Katilyn Miltenberger 03/21/2019,4:42 PM

## 2019-03-21 NOTE — Progress Notes (Signed)
[facility] [place] NAME: Donald [name]    MR#:  [id number]  DATE OF BIRTH:  08-27-1961  SUBJECTIVE:  CHIEF COMPLAINT:   Chief Complaint  Patient presents with  . Altered Mental Status   Patient is pleasantly confused.  Pulled his IV line REVIEW OF SYSTEMS:  Review of Systems  Unable to perform ROS: Acuity of condition  Respiratory: Negative for shortness of breath.   Cardiovascular: Negative for chest pain.    DRUG ALLERGIES:  No Known Allergies VITALS:  Blood pressure 134/86, pulse (!) 126, temperature 97.9 F (36.6 C), resp. rate 18, height 5\' 9"  (1.753 m), weight 49.2 kg, SpO2 100 %. PHYSICAL EXAMINATION:  Physical Exam Vitals signs reviewed.  Constitutional:      General: He is not in acute distress.    Appearance: He is ill-appearing. He is not toxic-appearing.     Comments: malnourished  HENT:     Head: Normocephalic and atraumatic.  Cardiovascular:     Rate and Rhythm: Normal rate and regular rhythm.     Comments: Some more color to BLLE with purple discoloration still present along toes Pulmonary:     Effort: Pulmonary effort is normal.     Breath sounds: Normal breath sounds.  Abdominal:     General: Abdomen is flat.     Palpations: Abdomen is soft.  Neurological:     General: No focal deficit present.     Comments: A&O to self only    LABORATORY PANEL:  Male CBC Recent Labs  Lab 03/21/19 0929  WBC 17.2*  HGB 9.1*  HCT 28.2*  PLT 372   ------------------------------------------------------------------------------------------------------------------ Chemistries  Recent Labs  Lab 03/16/19 0557  03/20/19 0459 03/21/19 0929  NA 149*   < > 147*  --   K 3.6   < > 4.7  --   CL 118*   < > 121*  --   CO2 21*   < > 19*  --   GLUCOSE 94   < > 101*  --   BUN 17   < > 22*  --   CREATININE 0.83   < > 0.97 0.94  CALCIUM 9.3   < > 8.8*  --   MG 2.0  --   --   --    < > = values in this interval not  displayed.   RADIOLOGY:  No results found. ASSESSMENT AND PLAN:  ARBER WIEMERS Jr.is a 58yo male with a PMH of PAD, h/o ETOH use disorder, COPD, HTN, BPH, h/o tobacco use disorder, h/o seizure, and protein-calorie malnutrition who presented with worsening AMS found to be septic.  1.  Acute deliriumurosepsis, sepsis improved; Leukocytosis: pt remains afebrile but still altered - ID following, appreciate recommednations and patient restarted on cefepime and vanc - Leukocytosis believed to be 2/2 PAD and ischemia, improved after procedure; WBCs (26.4>23.2) - CXR with no signs of acute process, procalcitonin 0.53>0.34  -Blood cultures, NGx4d - Urine culturewith staphepidermidis, which is likely contaminate per ID - MRSA PCRpositive - IVFs and PRN bolus to keep MAP >60 per sepsis protocol - palliative care following, patient now DNR -B12 and folate levels are normal.  B1 level is pending.  Check CMP and ammonia -Neurology consult -recommending EEG and repeat brain imaging-CT scan  2.Tachycardia -IV fluid bolus and continue IV fluids -Monitor patient telemetry - EKG-with sinus tachycardia seen by cardiology -Dr. Clayborn Bigness appreciate their recommendations -IV Lopressor as needed.  Toprol dose  increased 200 mg we will continue close monitoring  3.PAD with ischemic feet BL s/p PCI 03/13/19, for revascularization on 03/16/2019 - Vascular surgeryfollowing-patient to go forsurgery for revascularization with possible femoral endarterectomy/extensive aortoiliac stenting -continueheparin gttper vascular sx -Wound vacs from the the groin removed   4. ETOH abuse - Recent hospitalizationforDTs -Noactivesigns of withdrawal, CIWA monitoring - Check daily BMP+Mg- wnl 7/9 - Daily Folate, MVI;continued high-dose IV thiamine x3 days - PT/OT evaluation for mobility-  5. Penile/ sacral (stage II)/ scrotal decubiti ulcerss/p revascularization  - woundcare consulted -Foley  catheter in place to help keep the area dry - Likely due to the condom cath use in the previous hospitalization - ID has sent aerobic culture as well as HSV of ulcers  6. Hypokalemia Hypomagnesemia -Monitor K and mag, repleteas indicated  7.HTN-Goal BP <140/90 -OnClonidine, metoprolol and amlodipine - IV labetalol PRN  8.Severe, protein calorie malnutrition- likely 2/2 ETOH use disorder - nutritionfollowing  9.  Right foot third toe discoloration-podiatry consult placed.  Most likely will progress to gangrene per podiatry.  No other interventions are recommended at this point from podiatry or vascular standpoint Heparin drip will be changed to Eliquis  Disposition skilled nursing facility  All the records are reviewed and case discussed with Care Management/Social Worker. Management plans discussed with the patient, mom over phone and they are in agreement.  CODE STATUS: DNR  POSSIBLE D/C IN 2-3 DAYS, DEPENDING ON CLINICAL CONDITION.  [name], DO PGY-3, [name]  03/21/2019 at 6:22 PM  Between 7am to 6pm - Pager - [phone]  After 6pm go to [email or web address] - Proofreader  [facility]  Office  [phone]  CC: Primary care physician; Olin Hauser, DO

## 2019-03-21 NOTE — Consult Note (Signed)
ORTHOPAEDIC CONSULTATION  REQUESTING PHYSICIAN: Nicholes Mango, MD  Chief Complaint: Discoloration right third toe  HPI: Donald [name]. is a 58 y.o. male admitted with recent urosepsis.  Underwent revascularization by vascular surgery.  Found to have darkened purple discoloration to his right third toe and consult was placed.  Patient is not able to provide review of systems.  Noted to be confused.  Past Medical History:  Diagnosis Date  . Depression   . Emphysema of lung ([place])   . Essential hypertension   . History of chicken pox   . Low weight   . Neuropathy    bilateral lower legs  . Peripheral vascular disease ([place])   . Prostate disease   . Seasonal allergies   . Seizures ([place])   . Wears dentures    full upper   Past Surgical History:  Procedure Laterality Date  . ABDOMINAL AORTIC ENDOVASCULAR STENT GRAFT Bilateral 03/16/2019   Procedure: AORTIC ILIAC STENT;  Surgeon: Algernon Huxley, MD;  Location: [facility];  Service: Vascular;  Laterality: Bilateral;  . CATARACT EXTRACTION W/PHACO Left 05/18/2018   Procedure: CATARACT EXTRACTION PHACO AND INTRAOCULAR LENS PLACEMENT ([place]) LEFT;  Surgeon: Leandrew Koyanagi, MD;  Location: [place];  Service: Ophthalmology;  Laterality: Left;  . COLONOSCOPY WITH PROPOFOL N/A 02/25/2016   Procedure: COLONOSCOPY WITH PROPOFOL;  Surgeon: [name], MD;  Location: [facility];  Service: Endoscopy;  Laterality: N/A;  . ENDARTERECTOMY FEMORAL Bilateral 03/16/2019   Procedure: ENDARTERECTOMY FEMORAL;  Surgeon: Algernon Huxley, MD;  Location: [facility];  Service: Vascular;  Laterality: Bilateral;  . EYE SURGERY  2015   Cataract  . LOWER EXTREMITY ANGIOGRAPHY Left 03/13/2019   Procedure: Lower Extremity Angiography (LEFT);  Surgeon: Algernon Huxley, MD;  Location: [facility];  Service: Cardiovascular;  Laterality: Left;  Marland Kitchen VASCULAR SURGERY     "7 stents in legs"   Social History   Socioeconomic History  . Marital status: Single    Spouse name: Not on file  . Number of children: Not on file  . Years of education: Not on file  . Highest education level: Not on file  Occupational History  . Not on file  Social Needs  . Financial resource strain: Not on file  . Food insecurity    Worry: Not on file    Inability: Not on file  . Transportation needs    Medical: Not on file    Non-medical: Not on file  Tobacco Use  . Smoking status: Former Smoker    Packs/day: 0.50    Years: 35.00    Pack years: 17.50    Types: Cigarettes    Quit date: 01/25/2018    Years since quitting: 1.1  . Smokeless tobacco: Former Systems developer    Quit date: 08/31/2016  . Tobacco comment: Previously quit in 09/2016 on Chantix until 09/2017  Substance and Sexual Activity  . Alcohol use: Not Currently    Alcohol/week: 0.0 standard drinks    Comment: occ  . Drug use: No  . Sexual activity: Not on file  Lifestyle  . Physical activity    Days per week: 0 days    Minutes per session: Not on file  . Stress: Not on file  Relationships  . Social Herbalist on phone: Not on file    Gets together: Not on file    Attends religious service: Not on file    Active member of club or organization: Not on file  Attends meetings of clubs or organizations: Not on file    Relationship status: Not on file  Other Topics Concern  . Not on file  Social History Narrative  . Not on file   Family History  Problem Relation Age of Onset  . Ulcers Father        bleeding  . Cancer Mother        Breast   No Known Allergies Prior to Admission medications   Medication Sig Start Date End Date Taking? Authorizing Provider  acetaminophen (TYLENOL) 500 MG tablet Take 500 mg by mouth every 6 (six) hours as needed.   Yes [provider]  albuterol (VENTOLIN HFA) 108 (90 Base) MCG/ACT inhaler Inhale 2 puffs into the lungs every 6 (six) hours as needed for wheezing or shortness of breath. 02/15/19  Yes Karamalegos, Devonne Doughty, DO  amLODipine  (NORVASC) 10 MG tablet Take 1 tablet (10 mg total) by mouth daily. 03/09/19  Yes Ojie, Jude, MD  aspirin 81 MG tablet Take 1 tablet (81 mg total) by mouth daily. 07/29/15  Yes Krebs, Amy Lauren, NP  cloNIDine (CATAPRES) 0.1 MG tablet Take 1 tablet (0.1 mg total) by mouth 2 (two) times daily. 03/08/19  Yes Ojie, Jude, MD  feeding supplement, ENSURE ENLIVE, (ENSURE ENLIVE) LIQD Take 237 mLs by mouth 2 (two) times daily between meals. 03/08/19 04/07/19 Yes Ojie, Jude, MD  Fluticasone-Salmeterol (ADVAIR) 250-50 MCG/DOSE AEPB Inhale 1 puff into the lungs 2 (two) times daily. 07/06/17  Yes Karamalegos, Devonne Doughty, DO  megestrol (MEGACE) 40 MG tablet Take 2-4 tablets (80-160 mg total) by mouth daily. Start with 2 tablets a day, then gradually increase as tolerated up to 4 per day 01/12/19  Yes Karamalegos, Devonne Doughty, DO  metoprolol succinate (TOPROL-XL) 50 MG 24 hr tablet Take 1 tablet (50 mg total) by mouth daily. 12/23/18  Yes Karamalegos, Devonne Doughty, DO  tamsulosin (FLOMAX) 0.4 MG CAPS capsule Take 1 capsule (0.4 mg total) by mouth daily after supper. 01/12/19  Yes Karamalegos, Devonne Doughty, DO  chlordiazePOXIDE (LIBRIUM) 25 MG capsule 25 mg p.o. twice daily x2 days Then 25 mg p.o. daily x2 days Patient not taking: Reported on 03/09/2019 03/08/19   Otila Back, MD   No results found.  Positive ROS: All other systems have been reviewed and were otherwise negative with the exception of those mentioned in the HPI and as above.  12 point ROS was performed.  Physical Exam: General: Alert and oriented.  No apparent distress.  Vascular:  Left foot:Dorsalis Pedis:  diminished Posterior Tibial:  Not evaluated.  Right foot: Dorsalis Pedis:  present Posterior Tibial:  diminished  Neuro:intact gross sensation.  Protective sensation was very difficult to ascertain as patient not able to provide review of systems and answer questions appropriately today.  Derm:  Left foot with small wound on medial second toe.   Noninfected.  Right foot see clinical photo.  Discoloration on the dorsal aspect of the foot extending proximally to the midfoot area.  Erythematous currently.  Suspect this will become necrotic tissue.  The distal aspect of the third toe has a dark purple discoloration.  The plantar aspect and distal aspect of the right great toe also with purple discoloration.  These are likely pregangrenous as well.  Suspect emboli to the area.  Ortho/MS: Patient in contracted position.  Unable to extend at the knee or hip.  He demonstrated minimal dorsi and plantar flexion of the foot.      Assessment: Peripheral vascular  disease with pregangrenous changes right foot  Plan: Patient will likely have further necrosis of the skin.  Suspect gangrenous changes will continue to progress to the third toe and likely the great toe.  Also suspect dorsal foot will have gangrenous changes of the skin.  At this time recommend just monitoring until demarcation occurs.  These do not appear to be infected at this time.  Right foot there are no open wounds.  There is a small abrasion on the left second toe.  Should continue to monitor.  For now no surgical intervention warranted.  Once demarcation occurs can consider debridement/amputation as appropriate.  Continue with current heel protection.    Elesa Hacker, DPM Cell [phone]   03/21/2019 7:49 AM

## 2019-03-21 NOTE — Plan of Care (Signed)

## 2019-03-21 NOTE — Consult Note (Signed)
Reason for Consult:AMS Referring Physician: Gouru  CC: AMS  HPI: Donald [name]. is an 58 y.o. male with multiple medical problems including history of seizures and ETOH abuse who presented on 7/6 with AMS.  Patient has recently been discharged after an admission for DT's.  Had poor po intake after that time.  Admitted with PVD requiring stent placement and endarterectomy.  Also noted to have multiple metabolic abnormaliies and UTI.  Patient currently on Cefepime and Vancomycin.  ID and vascular following patient.  Has remained altered throughout hospitalization.    Past Medical History:  Diagnosis Date  . Depression   . Emphysema of lung ([place])   . Essential hypertension   . History of chicken pox   . Low weight   . Neuropathy    bilateral lower legs  . Peripheral vascular disease ([place])   . Prostate disease   . Seasonal allergies   . Seizures ([place])   . Wears dentures    full upper    Past Surgical History:  Procedure Laterality Date  . ABDOMINAL AORTIC ENDOVASCULAR STENT GRAFT Bilateral 03/16/2019   Procedure: AORTIC ILIAC STENT;  Surgeon: Algernon Huxley, MD;  Location: [facility];  Service: Vascular;  Laterality: Bilateral;  . CATARACT EXTRACTION W/PHACO Left 05/18/2018   Procedure: CATARACT EXTRACTION PHACO AND INTRAOCULAR LENS PLACEMENT ([place]) LEFT;  Surgeon: Leandrew Koyanagi, MD;  Location: [facility];  Service: Ophthalmology;  Laterality: Left;  . COLONOSCOPY WITH PROPOFOL N/A 02/25/2016   Procedure: COLONOSCOPY WITH PROPOFOL;  Surgeon: [name], MD;  Location: [facility];  Service: Endoscopy;  Laterality: N/A;  . ENDARTERECTOMY FEMORAL Bilateral 03/16/2019   Procedure: ENDARTERECTOMY FEMORAL;  Surgeon: Algernon Huxley, MD;  Location: [facility];  Service: Vascular;  Laterality: Bilateral;  . EYE SURGERY  2015   Cataract  . LOWER EXTREMITY ANGIOGRAPHY Left 03/13/2019   Procedure: Lower Extremity Angiography (LEFT);  Surgeon: Algernon Huxley, MD;  Location: [facility];  Service: Cardiovascular;  Laterality: Left;  [facility] VASCULAR SURGERY     "7 stents in legs"    Family History  Problem Relation Age of Onset  . Ulcers Father        bleeding  . Cancer Mother        Breast    Social History:  reports that he quit smoking about 13 months ago. His smoking use included cigarettes. He has a 17.50 pack-year smoking history. He quit smokeless tobacco use about 2 years ago. He reports previous alcohol use. He reports that he does not use drugs.  No Known Allergies  Medications:  I have reviewed the patient's current medications. Prior to Admission:  Medications Prior to Admission  Medication Sig Dispense Refill Last Dose  . acetaminophen (TYLENOL) 500 MG tablet Take 500 mg by mouth every 6 (six) hours as needed.   03/11/2019  . albuterol (VENTOLIN HFA) 108 (90 Base) MCG/ACT inhaler Inhale 2 puffs into the lungs every 6 (six) hours as needed for wheezing or shortness of breath. 8.5 g 2 Unknown at PRN  . amLODipine (NORVASC) 10 MG tablet Take 1 tablet (10 mg total) by mouth daily. 30 tablet 0 03/12/2019 at 1000  . aspirin 81 MG tablet Take 1 tablet (81 mg total) by mouth daily. 30 tablet 11 03/08/2019 at Unknown time  . cloNIDine (CATAPRES) 0.1 MG tablet Take 1 tablet (0.1 mg total) by mouth 2 (two) times daily. 60 tablet 0 03/08/2019 at Unknown time  . feeding supplement, ENSURE ENLIVE, (  ENSURE ENLIVE) LIQD Take 237 mLs by mouth 2 (two) times daily between meals. 14220 mL 0 03/11/2019  . Fluticasone-Salmeterol (ADVAIR) 250-50 MCG/DOSE AEPB Inhale 1 puff into the lungs 2 (two) times daily. 60 each 11 03/08/2019 at Unknown time  . megestrol (MEGACE) 40 MG tablet Take 2-4 tablets (80-160 mg total) by mouth daily. Start with 2 tablets a day, then gradually increase as tolerated up to 4 per day 120 tablet 0 Past Week at Unknown time  . metoprolol succinate (TOPROL-XL) 50 MG 24 hr tablet Take 1 tablet (50 mg total) by mouth daily. 90 tablet 1 03/08/2019 at Unknown time  .  tamsulosin (FLOMAX) 0.4 MG CAPS capsule Take 1 capsule (0.4 mg total) by mouth daily after supper. 90 capsule 3 03/08/2019 at Unknown time  . chlordiazePOXIDE (LIBRIUM) 25 MG capsule 25 mg p.o. twice daily x2 days Then 25 mg p.o. daily x2 days (Patient not taking: Reported on 03/09/2019) 6 capsule 0 03/07/2019 at Unknown time   Scheduled: . amLODipine  10 mg Oral Daily  . aspirin EC  81 mg Oral Daily  . cholecalciferol  1,000 Units Oral Daily  . cloNIDine  0.1 mg Oral BID  . clopidogrel  75 mg Oral Daily  . feeding supplement (ENSURE ENLIVE)  237 mL Oral TID BM  . folic acid  1 mg Oral Daily  . Gerhardt's butt cream   Topical TID  . ipratropium-albuterol  3 mL Nebulization BID  . megestrol  200 mg Oral BID  . metoprolol succinate  150 mg Oral Daily  . metoprolol succinate  50 mg Oral Daily  . multivitamin with minerals  1 tablet Oral Daily  . potassium chloride  40 mEq Oral BID  . sodium chloride flush  10-40 mL Intracatheter Q12H  . tamsulosin  0.4 mg Oral QPC supper  . thiamine  100 mg Oral Daily  . vitamin C  250 mg Oral BID  . ziprasidone  10 mg Intramuscular Once    ROS: Unable to provide due to mental status  Physical Examination: Blood pressure (!) 154/93, pulse (!) 113, temperature 97.9 F (36.6 C), resp. rate 18, height 5\' 9"  (1.753 m), weight 49.2 kg, SpO2 100 %.  Gen: Cachectic HEENT-  Normocephalic, no lesions, without obvious abnormality.  Normal external eye and conjunctiva.  Normal TM's bilaterally.  Normal auditory canals and external ears. Normal external nose, mucus membranes and septum.  Normal pharynx. Cardiovascular- S1, S2 normal, pulses palpable throughout   Lungs- chest clear, no wheezing, rales, normal symmetric air entry Abdomen- soft, non-tender; bowel sounds normal; no masses,  no organomegaly Extremities- painful to the touch Lymph-no adenopathy palpable Musculoskeletal-no joint tenderness, deformity or swelling Skin-erythematous sores on the  feet  Neurological Examination   Mental Status: Alert.  Speech often incomprehensible but on other occasions short sentences would be fluent.  Follows simple commands.  Oriented to last name.  When asked his first name he continues to repeat his last.  Not oriented otherwise.    Cranial Nerves: II: Blinks to bilateral confrontation. Pupils equal, round, reactive to light and accommodation III,IV, VI: ptosis not present, extra-ocular motions grossly intact bilaterally V,VII: smile symmetric, facial light touch sensation normal bilaterally VIII: hearing normal bilaterally IX,X: gag reflex present XI: bilateral shoulder shrug XII: midline tongue extension Motor: Able to lift both upper extremities against gravity with no focal weakness noted.  Legs flexed in fetal position and patient moves them only minimally, complaining of pain Sensory: Does not respond to  noxious stimuli in the legs Deep Tendon Reflexes: 2+ in the upper extremities Plantars: Right: mute   Left: mute Cerebellar: No dysmetria noted with finger-to-nose testing Gait: not tested due to safety concerns    Laboratory Studies:   Basic Metabolic Panel: Recent Labs  Lab 03/14/19 2334 03/16/19 0557 03/17/19 0344 03/18/19 0330 03/19/19 0521 03/20/19 0459 03/21/19 0929  NA 144 149* 151* 146*  --  147*  --   K 3.2* 3.6 3.7 3.9  --  4.7  --   CL 115* 118* 125* 120*  --  121*  --   CO2 21* 21* 20* 19*  --  19*  --   GLUCOSE 130* 94 114* 89  --  101*  --   BUN 15 17 23* 23*  --  22*  --   CREATININE 0.86 0.83 0.90 0.89 0.91 0.97 0.94  CALCIUM 8.9 9.3 8.8* 8.7*  --  8.8*  --   MG 2.0 2.0  --   --   --   --   --     Liver Function Tests: No results for input(s): AST, ALT, ALKPHOS, BILITOT, PROT, ALBUMIN in the last 168 hours. No results for input(s): LIPASE, AMYLASE in the last 168 hours. No results for input(s): AMMONIA in the last 168 hours.  CBC: Recent Labs  Lab 03/17/19 0344 03/18/19 0330 03/19/19 0521  03/20/19 0459 03/21/19 0929  WBC 23.2* 23.0* 18.5* 19.4* 17.2*  HGB 11.0* 9.2* 8.9* 9.0* 9.1*  HCT 33.7* 28.4* 27.1* 27.8* 28.2*  MCV 104.0* 104.4* 103.8* 106.1* 106.0*  PLT 250 243 252 289 372    Cardiac Enzymes: No results for input(s): CKTOTAL, CKMB, CKMBINDEX, TROPONINI in the last 168 hours.  BNP: Invalid input(s): POCBNP  CBG: No results for input(s): GLUCAP in the last 168 hours.  Microbiology: Results for orders placed or performed during the hospital encounter of 03/13/19  Urine culture     Status: Abnormal   Collection Time: 03/13/19  9:56 AM   Specimen: Urine, Clean Catch  Result Value Ref Range Status   Specimen Description   Final    URINE, CLEAN CATCH Performed at [facility], [address] [place], [place] [address]    Special Requests   Final    NONE Performed at [facility], [place],  [address]    Culture >=100,000 COLONIES/mL STAPHYLOCOCCUS EPIDERMIDIS (A)  Final   Report Status 03/15/2019 FINAL  Final   Organism ID, Bacteria STAPHYLOCOCCUS EPIDERMIDIS (A)  Final      Susceptibility   Staphylococcus epidermidis - MIC*    CIPROFLOXACIN <=0.5 SENSITIVE Sensitive     GENTAMICIN <=0.5 SENSITIVE Sensitive     NITROFURANTOIN <=16 SENSITIVE Sensitive     OXACILLIN <=0.25 SENSITIVE Sensitive     TETRACYCLINE >=16 RESISTANT Resistant     VANCOMYCIN 2 SENSITIVE Sensitive     TRIMETH/SULFA <=10 SENSITIVE Sensitive     CLINDAMYCIN <=0.25 SENSITIVE Sensitive     RIFAMPIN <=0.5 SENSITIVE Sensitive     Inducible Clindamycin NEGATIVE Sensitive     * >=100,000 COLONIES/mL STAPHYLOCOCCUS EPIDERMIDIS  SARS Coronavirus 2 (CEPHEID - Performed in [place] hospital lab), Hosp Order     Status: None   Collection Time: 03/13/19  9:56 AM   Specimen: Nasopharyngeal Swab  Result Value Ref Range Status   SARS Coronavirus 2 NEGATIVE NEGATIVE Final    Comment: (NOTE) If result is NEGATIVE SARS-CoV-2 target nucleic acids are  NOT DETECTED. The SARS-CoV-2 RNA is generally  detectable in upper and lower  respiratory specimens during the acute phase of infection. The lowest  concentration of SARS-CoV-2 viral copies this assay can detect is 250  copies / mL. A negative result does not preclude SARS-CoV-2 infection  and should not be used as the sole basis for treatment or other  patient management decisions.  A negative result may occur with  improper specimen collection / handling, submission of specimen other  than nasopharyngeal swab, presence of viral mutation(s) within the  areas targeted by this assay, and inadequate number of viral copies  (<250 copies / mL). A negative result must be combined with clinical  observations, patient history, and epidemiological information. If result is POSITIVE SARS-CoV-2 target nucleic acids are DETECTED. The SARS-CoV-2 RNA is generally detectable in upper and lower  respiratory specimens dur ing the acute phase of infection.  Positive  results are indicative of active infection with SARS-CoV-2.  Clinical  correlation with patient history and other diagnostic information is  necessary to determine patient infection status.  Positive results do  not rule out bacterial infection or co-infection with other viruses. If result is PRESUMPTIVE POSTIVE SARS-CoV-2 nucleic acids MAY BE PRESENT.   A presumptive positive result was obtained on the submitted specimen  and confirmed on repeat testing.  While 2019 novel coronavirus  (SARS-CoV-2) nucleic acids may be present in the submitted sample  additional confirmatory testing may be necessary for epidemiological  and / or clinical management purposes  to differentiate between  SARS-CoV-2 and other Sarbecovirus currently known to infect humans.  If clinically indicated additional testing with an alternate test  methodology [phone]) is advised. The SARS-CoV-2 RNA is generally  detectable in upper and lower respiratory sp ecimens  during the acute  phase of infection. The expected result is Negative. Fact Sheet for Patients:  StrictlyIdeas.no Fact Sheet for Healthcare Providers: BankingDealers.co.za This test is not yet approved or cleared by the [place] FDA and has been authorized for detection and/or diagnosis of SARS-CoV-2 by FDA under an Emergency Use Authorization (EUA).  This EUA will remain in effect (meaning this test can be used) for the duration of the COVID-19 declaration under Section 564(b)(1) of the Act, 21 U.S.C. section 360bbb-3(b)(1), unless the authorization is terminated or revoked sooner. Performed at [facility], [address] [place], [place] [address]   Blood Culture (routine x 2)     Status: None   Collection Time: 03/13/19 10:39 AM   Specimen: BLOOD  Result Value Ref Range Status   Specimen Description BLOOD BLOOD RIGHT FOREARM  Final   Special Requests   Final    BOTTLES DRAWN AEROBIC AND ANAEROBIC Blood Culture results may not be optimal due to an excessive volume of blood received in culture bottles   Culture   Final    NO GROWTH 5 DAYS Performed at [facility], [address], [place], [place] [address]    Report Status 03/18/2019 FINAL  Final  Blood Culture (routine x 2)     Status: None   Collection Time: 03/13/19 10:39 AM   Specimen: BLOOD  Result Value Ref Range Status   Specimen Description BLOOD FOREARM  Final   Special Requests   Final    BOTTLES DRAWN AEROBIC AND ANAEROBIC Blood Culture adequate volume   Culture   Final    NO GROWTH 5 DAYS Performed at [facility], [address] [place], [place] [address]    Report Status 03/18/2019 FINAL  Final  MRSA PCR Screening     Status: Abnormal   Collection Time: 03/13/19  7:04 PM   Specimen: Nasal Mucosa; Nasopharyngeal  Result Value Ref Range Status   MRSA by PCR POSITIVE (A) NEGATIVE Final    Comment:        The GeneXpert MRSA  Assay (FDA approved for NASAL specimens only), is one component of a comprehensive MRSA colonization surveillance program. It is not intended to diagnose MRSA infection nor to guide or monitor treatment for MRSA infections. RESULT CALLED TO, READ BACK BY AND VERIFIED WITH: VIVIAN ALI @2021  03/13/19 MJU Performed at [facility], [address] [place], [place] [address]   Aerobic Culture (superficial specimen)     Status: None   Collection Time: 03/16/19  9:52 PM   Specimen: Wound  Result Value Ref Range Status   Specimen Description   Final    WOUND Performed at [facility], [address] [place], [place] [address]    Special Requests   Final    NONE Performed at [facility], [address] [place], [place] [address]    Gram Stain NO WBC SEEN NO ORGANISMS SEEN   Final   Culture   Final    NO GROWTH 2 DAYS Performed at [facility], [place] [address], [place], [place] [address]    Report Status 03/19/2019 FINAL  Final  Hsv Culture And Typing     Status: None   Collection Time: 03/16/19  9:52 PM   Specimen: Penile; Other  Result Value Ref Range Status   HSV Culture/Type Comment  Final    Comment: (NOTE) Negative No Herpes simplex virus isolated. Performed At: [facility] [place], [place] [id number] Rush Farmer MD MV:[id number]    Source of Sample PENIS  Final    Comment: Performed at [facility], [address] [place], [place] [address]    Coagulation Studies: No results for input(s): LABPROT, INR in the last 72 hours.  Urinalysis: No results for input(s): COLORURINE, LABSPEC, PHURINE, GLUCOSEU, HGBUR, BILIRUBINUR, KETONESUR, PROTEINUR, UROBILINOGEN, NITRITE, LEUKOCYTESUR in the last 168 hours.  Invalid input(s): APPERANCEUR  Lipid Panel:     Component Value Date/Time   CHOL 143 01/05/2019 0800   CHOL 135 07/31/2015 1117   CHOL 68 11/20/2011 0510   TRIG 101 01/05/2019 0800    TRIG 47 11/20/2011 0510   HDL 49 01/05/2019 0800   HDL 84 07/31/2015 1117   HDL 31 (L) 11/20/2011 0510   CHOLHDL 2.9 01/05/2019 0800   VLDL 13 09/29/2016 0001   VLDL 9 11/20/2011 0510   LDLCALC 76 01/05/2019 0800   LDLCALC 28 11/20/2011 0510    HgbA1C:  Lab Results  Component Value Date   HGBA1C 4.9 01/05/2019    Urine Drug Screen:      Component Value Date/Time   LABOPIA NONE DETECTED 03/13/2019 0956   COCAINSCRNUR NONE DETECTED 03/13/2019 0956   LABBENZ POSITIVE (A) 03/13/2019 0956   AMPHETMU NONE DETECTED 03/13/2019 0956   THCU NONE DETECTED 03/13/2019 0956   LABBARB NONE DETECTED 03/13/2019 0956    Alcohol Level: No results for input(s): ETH in the last 168 hours.  Other results: EKG: sinus tachycardia at 113 bpm.  Imaging:  EXAM: CT HEAD WITHOUT CONTRAST  TECHNIQUE: Contiguous axial images were obtained from the base of the skull through the vertex without intravenous contrast.  COMPARISON:  Head CT dated 03/04/2019.  FINDINGS: Brain: Generalized parenchymal volume loss with commensurate dilatation of  the ventricles and sulci. Chronic small vessel ischemic changes again noted within the bilateral periventricular and subcortical white matter regions. Old lacunar infarcts again noted within each basal ganglia region.  No mass, hemorrhage, edema or other evidence of acute parenchymal abnormality. No extra-axial hemorrhage.  Vascular: Chronic calcified atherosclerotic changes of the large vessels at the skull base. No unexpected hyperdense vessel.  Skull: Normal. Negative for fracture or focal lesion.  Sinuses/Orbits: No acute finding.  Other: None.  IMPRESSION: 1. No acute findings. No intracranial mass, hemorrhage or edema. 2. Atrophy and chronic ischemic changes, as detailed above.   Assessment/Plan: 58 year old male with multiple medical problems including seizures and ETOH abuse.  Patient with previous history of DT's.  Poor recent  po intake and now s/p two vascular surgeries.  Wernicke's is very likely etiology for altered mental status but suspect there is some multifactorial nature to his mental status as well.  Patient with infection and multiple metabolic abnormalities.  Head CT performed on 7/6 reviewed and shows no acute changes.   B12, folate are normal.  B1 pending.    Recommendations: 1. Agree with continuing to address medical issues 2. EEG 3. Repeat brain imaging.  Due to stent placement patient may not be a MRI candidate.  May need to repeat head CT instead. 4. B1 pending.  Patient should also have CMET and ammonia level.     Alexis Goodell, MD Neurology [phone] 03/21/2019, 10:34 AM

## 2019-03-21 NOTE — Progress Notes (Signed)
Physical Therapy Treatment Patient Details Name: Donald [name]. MRN: [id number] DOB: [birth year] Today's Date: 03/21/2019    History of Present Illness Donald [name] is a 73yoM who comes [facility] on 7/6 c continued BLE pain and AMS, found to be septic 2/2 UTI. PMH: PAD, ETOH, COPD, HTN, BPH. Pt underwent PTA in BLE/iliac AA on 7/6, and on 7/9 BLE endarterectomies (femoral/iliac) and stent placement (iliac and aortic). Pt was recently admitted 6/27-7/1 after fall/LOC and with ETOH withdrawl.    PT Comments    Pt with considerably limited ROM in b/l LEs, c/o pain with even minimal A/PROM and generally was too distracted and off topic t/o session to fully participate.  Pt needed excessive cuing and encouragement to get a modicum of exercises. Pt with knee flexion contractures and became agitated each time PT tried to do much to do PROM or try to elicit any movement to aid leg extensions.  Pt willing to do some minimal tasks, but ultimately this was a very limited PT session 2/2 pain and confusion.    Follow Up Recommendations  Supervision for mobility/OOB;SNF;Supervision/Assistance - 24 hour     Equipment Recommendations  (TBD at next venue)    Recommendations for Other Services       Precautions / Restrictions Precautions Precautions: Fall Precaution Comments: confused, fidgeting, mittens donned. Restrictions Weight Bearing Restrictions: No    Mobility  Bed Mobility               General bed mobility comments: Attempted to encourage getting up to sitting t/o session, pt flatly refusing and appearing to become more agitated with suggestion that he try at least some mobility  Transfers                    Ambulation/Gait                 Stairs             Wheelchair Mobility    Modified Rankin (Stroke Patients Only)       Balance                                            Cognition Arousal/Alertness: Awake/alert Behavior  During Therapy: Restless;Impulsive Overall Cognitive Status: Impaired/Different from baseline                   Orientation Level: Disoriented to;Time;Situation     Following Commands: Follows one step commands inconsistently;Follows one step commands with increased time Safety/Judgement: Decreased awareness of safety;Decreased awareness of deficits     General Comments: Pt still confused, with regular off topic/tangential conversation      Exercises General Exercises - Lower Extremity Ankle Circles/Pumps: 5 reps;10 reps;AROM(tolerated minimal AROM, attempts to do PROM were rebuffed) Short Arc Quad: AAROM;5 reps;Both(limited mid range only as flexion contractures present) Heel Slides: PROM;5 reps Hip ABduction/ADduction: AAROM;10 reps;Both(pt c/o pain t/o exercises, limited ROM tolerated) Straight Leg Raises: (attempted PROM refused 2/2 pain (no TKE))    General Comments        Pertinent Vitals/Pain Pain Assessment: Faces Faces Pain Scale: Hurts whole lot Pain Location: b/l LEs R>L, c/o signficant pain with any movement    Home Living                      Prior Function  PT Goals (current goals can now be found in the care plan section) Progress towards PT goals: Not progressing toward goals - comment(limited tolerance to and acceptance of exercises)    Frequency    Min 2X/week      PT Plan Current plan remains appropriate    Co-evaluation              AM-PAC PT "6 Clicks" Mobility   Outcome Measure  Help needed turning from your back to your side while in a flat bed without using bedrails?: Total Help needed moving from lying on your back to sitting on the side of a flat bed without using bedrails?: Total Help needed moving to and from a bed to a chair (including a wheelchair)?: Total Help needed standing up from a chair using your arms (e.g., wheelchair or bedside chair)?: Total Help needed to walk in hospital room?:  Total Help needed climbing 3-5 steps with a railing? : Total 6 Click Score: 6    End of Session   Activity Tolerance: Patient limited by pain Patient left: in bed;with bed alarm set;with call bell/phone within reach Nurse Communication: Mobility status PT Visit Diagnosis: Unsteadiness on feet (R26.81);Other abnormalities of gait and mobility (R26.89);Muscle weakness (generalized) (M62.81);Difficulty in walking, not elsewhere classified (R26.2)     Time: 1547-1600 PT Time Calculation (min) (ACUTE ONLY): 13 min  Charges:  $Therapeutic Exercise: 8-22 mins                     Kreg Shropshire, DPT 03/21/2019, 5:14 PM

## 2019-03-21 NOTE — Progress Notes (Signed)
Patient's three separate wound care orders completed at this time. Patient tolerated fairly. Appreciate help of Darlene, [place], [place]. Dressing timed, dated, and initialed. Wound dressing status to be monitored for remainder of shift. Rest of foam pads look clean, dry, and intact. Will pass along in shift report. Wenda Low [facility]

## 2019-03-21 NOTE — Progress Notes (Addendum)
Palliative: [name], [name], is resting quietly in bed.  He greets me, making and somewhat keeping eye cotnact.  He appears chronically ill and frail.  His appears to be contacted, but is able to use his upper extremities.  Donald [name] denies pain.  He is oriented to self only. There is no family at bedside at this time due to visitor restrictions.   Call to mother/healthcare surrogate, Donald [name] and step father Donald [name] at [phone]. We talk about neurology and podiatry consults and recommendations. We talk about skin breakdown, foley cath, UTI. Donald [name] asks about prostate issues.  Donald [name] shares that sacral wound was "almost healed" at home, but Donald [name] was in fetal position at home for a few days prior to hospitalization.     We talk in detail about pregangrenous changes of right foot.  Donald [name] and Donald [name] share that they don't think Donald [name] would want surgery for amputation, would not want to be a burden.  They share that he did not want to come to the hospital this time.  We talk about malnutrition, wound healing.   Donald [name] shares that Donald [name] told his Donald [name] he was "waiting to die" a few days prior to this hospitalization.   Donald [name] tells me that she has told both of Donald [name] adult children how ill Donald [name] is, and they would agree with any choice Donald [name] made.  Both are tearful on phone.  We talk about time for further recommended tests and outcomes.   PMT to continue to follow.      Plan: Continue to treat the treatable, Family is considering how to care for [name]. 24-48 hours for outcomes, Neurology, Podiatry results/recs.    55 minutes, extended time Donald Axe, NP Palliative Medicine Team Team Phone # [phone] Greater than 50% of this time was spent counseling and coordinating care related to the above assessment and plan.

## 2019-03-21 NOTE — Discharge Instructions (Addendum)
°  Follow-up with primary care physician in 2 to 3 days Follow-up with cardiology Dr. Clayborn Bigness in 1 week Follow-up with vascular surgery Dr. Rutherford Guys 1 week Follow-up with podiatry Dr. Cleda Mccreedy  in 2 weeks Continue Foley catheter Continue wound care Follow-up with neurology in 1 month  Vascular Surgery Discharge Instructions 1) Please clean bilateral groins with normal saline daily and gently pat dry to avoid incision breakdown and wound dehiscence. 2) Please place dry gauze to bilateral groins every four hours in an attempt to keep the patients groins / incisions dry to avoid incision breakdown and wound dehiscence.

## 2019-03-21 NOTE — Progress Notes (Signed)
eeg completed ° °

## 2019-03-21 NOTE — Progress Notes (Signed)
[facility] Daily Progress Note   Subjective: 5 Days Post-Op: 1. Left common femoral, profunda femoris, and superficial femoral artery endarterectomies 2. Right common femoral, profunda femoris, and superficial femoral artery endarterectomies 3. Catheter placement into aorta from bilateral femoral approach 4.Bilateral external iliac stent placements with 8 mm diameter by 10 cm length via bond stents 5.Bilateral common iliac stent placements with 8 mm diameter by 58 mm length lifestream stents 6.4 stent placements to the aorta to realign both iliac arteries up to the base of both renal arteries usingtwo9 mm diameter by 58 mm length lifestream stents and two 10 mm diameter by 58 mm length lifestream stents  Patient seems comfortable this AM. No issues overnight.   Objective: Vitals:   03/20/19 1944 03/20/19 1947 03/21/19 0436 03/21/19 0751  BP:  (!) 122/91 129/82 (!) 154/93  Pulse:  (!) 109 (!) 111 (!) 113  Resp:   20 18  Temp:  98.1 F (36.7 C) 98.2 F (36.8 C) 97.9 F (36.6 C)  TempSrc:  Oral Oral   SpO2: 100% 100% 100% 100%  Weight:      Height:        Intake/Output Summary (Last 24 hours) at 03/21/2019 1116 Last data filed at 03/21/2019 1012 Gross per 24 hour  Intake 0 ml  Output 3200 ml  Net -3200 ml   Physical Exam: A&Ox1, NAD CV: RRR Pulmonary: CTA Bilaterally Abdomen: Soft, Nontender, Nondistended Right Groin:  Femoral endarterectomy incision: Clean dry and intact.  No erythema. Left Groin:   Femoral endarterectomy incision: Clean dry and intact.  No erythema. Vascular:  Right Lower Extremity: Thigh soft.  Calf soft.  Extremity is distally warm to toes.  Third toe with some dark discoloration.  First toe with the beginning of discoloration.  Dorsal aspect of foot with reddish discoloration.  Left Lower Extremity: Thigh soft.  Calf soft.  Extremity is distally warm to toes.   Laboratory: CBC    Component Value Date/Time   WBC 17.2 (H) 03/21/2019 0929   HGB 9.1 (L) 03/21/2019 0929   HGB 14.9 07/31/2015 1117   HCT 28.2 (L) 03/21/2019 0929   HCT 42.5 07/31/2015 1117   PLT 372 03/21/2019 0929   PLT 161 07/31/2015 1117   BMET    Component Value Date/Time   NA 147 (H) 03/20/2019 0459   NA 125 (L) 02/07/2016 1305   NA 130 (L) 11/20/2011 0510   K 4.7 03/20/2019 0459   K 4.0 11/20/2011 0510   CL 121 (H) 03/20/2019 0459   CL 98 11/20/2011 0510   CO2 19 (L) 03/20/2019 0459   CO2 18 (L) 11/20/2011 0510   GLUCOSE 101 (H) 03/20/2019 0459   GLUCOSE 82 11/20/2011 0510   BUN 22 (H) 03/20/2019 0459   BUN 5 (L) 02/07/2016 1305   BUN 1 (L) 11/20/2011 0510   CREATININE 0.94 03/21/2019 0929   CREATININE 0.93 01/05/2019 0800   CALCIUM 8.8 (L) 03/20/2019 0459   CALCIUM 8.5 11/20/2011 0510   GFRNONAA >60 03/21/2019 0929   GFRNONAA 92 01/05/2019 0800   GFRAA >60 03/21/2019 0929   GFRAA 107 01/05/2019 0800   Assessment/Planning: The patient is a 58 year old male with multiple medical issues including peripheral artery disease status post bilateral femoral endarterectomy with multiple stent placements postop day #5 1) bilateral femoral endarterectomy incisions are healing well.  Worrisome for wound breakdown due to the patient constantly being in the fetal position.  We will place an order for groins to be  cleaned daily with dry dressing placed in groin changed multiple times a day to try to absorb any moisture to avoid breakdown. 2) seen by podiatry about toe discoloration.  Most likely will progress to gangrene as per podiatry. 3) from a vascular surgery standpoint, no further vascular interventions are indicated at this time.  The patient can be discharged when medically stable.  We will continue to see him as an outpatient to surveilled his disease.  Discussed with Dr. Ellis Parents Admiral Marcucci PA-C 03/21/2019 11:16 AM

## 2019-03-21 NOTE — Plan of Care (Signed)
  Problem: Education: Goal: Knowledge of General Education information will improve Description: Including pain rating scale, medication(s)/side effects and non-pharmacologic comfort measures Outcome: Not Progressing Note: Patient confused and delirious. Palliative care and neurology following. Today's EEG abnormal. Multiple wounds. Will continue to monitor overall progression. Wenda Low [facility]

## 2019-03-21 NOTE — Progress Notes (Signed)
ANTICOAGULATION CONSULT NOTE - Initial Consult  Pharmacy Consult for apixaban Indication: PVD  No Known Allergies  Patient Measurements: Height: 5\' 9"  (175.3 cm) Weight: 108 lb 7.5 oz (49.2 kg) IBW/kg (Calculated) : 70.7   Vital Signs: Temp: 97.9 F (36.6 C) (07/14 0751) Temp Source: Oral (07/14 0436) BP: 154/93 (07/14 0751) Pulse Rate: 113 (07/14 0751)  Labs: Recent Labs    03/19/19 0521 03/20/19 0459 03/21/19 0929  HGB 8.9* 9.0* 9.1*  HCT 27.1* 27.8* 28.2*  PLT 252 289 372  HEPARINUNFRC 0.52 0.66 0.84*  CREATININE 0.91 0.97 0.94    Estimated Creatinine Clearance: 61.8 mL/min (by C-G formula based on SCr of 0.94 mg/dL).  Assessment: 58 yo male transitioning from heparin drip to apixaban.  Pt status post bilateral femoral endarterectomy with multiple stent placements   Goal of Therapy:  Monitor platelets by anticoagulation protocol: Yes   Plan:  Apixaban 5 mg PO BID per MD Scr and CBC at least every three days  Pharmacy will continue to follow  Rayna Sexton L 03/21/2019,12:19 PM

## 2019-03-21 NOTE — Progress Notes (Signed)
Patient refused his Q 4 hr. groin site closure dressing change at this time. Won't open legs, and I feel if I force it I may end up hurting a bone, ligament, etc. Dressings from what I can tell still appear clean, dry, and intact. Will pass along in shift report. Wenda Low [facility]

## 2019-03-21 NOTE — Procedures (Signed)
ELECTROENCEPHALOGRAM REPORT   Patient: Donald [name].       Room #: 242A-AA EEG No. ID: [id number] Age: 58 y.o.        Sex: male Referring Physician: Gouru Report Date:  03/21/2019        Interpreting Physician: Alexis Goodell  History: Donald [name]. is an 58 y.o. male with altered mental status and history of seizures.  Medications:  Geodon, Vancomycin, Toprol, Plavix, Catapres, Norvasc, Eliquis, ASA  Conditions of Recording:  This is a 21 channel routine scalp EEG performed with bipolar and monopolar montages arranged in accordance to the international 10/20 system of electrode placement. One channel was dedicated to EKG recording.  The patient is in the awake state.  Description:  Artifact is prominent during the recording often obscuring the background rhythm. When able to be visualized the background is disorganized.  It consists of a low voltage mixture of frequencies with delta and theta frequencies being most prominent.  The best sustained posterior background rhythm is that of a 6-7 Hz alpha activity, seen from the parieto-occipital and posterior temporal regions. The patient does not drowse or sleep. No epileptiform activity is noted.   Hyperventilation was not performed.  Intermittent photic stimulation was performed but failed to illicit any change in the tracing.   IMPRESSION: This is an abnormal EEG secondary to general background slowing.  This finding may be seen with a diffuse disturbance that is etiologically nonspecific, but may include a metabolic encephalopathy, among other possibilities.  No epileptiform activity was noted.     Alexis Goodell, MD Neurology [phone] 03/21/2019, 2:06 PM

## 2019-03-22 ENCOUNTER — Inpatient Hospital Stay: Payer: Medicaid Other | Admitting: Family Medicine

## 2019-03-22 ENCOUNTER — Inpatient Hospital Stay: Payer: Medicaid Other

## 2019-03-22 DIAGNOSIS — I8290 Acute embolism and thrombosis of unspecified vein: Secondary | ICD-10-CM

## 2019-03-22 LAB — CBC
HCT: 25.7 % — ABNORMAL LOW (ref 39.0–52.0)
Hemoglobin: 8.3 g/dL — ABNORMAL LOW (ref 13.0–17.0)
MCH: 34 pg (ref 26.0–34.0)
MCHC: 32.3 g/dL (ref 30.0–36.0)
MCV: 105.3 fL — ABNORMAL HIGH (ref 80.0–100.0)
Platelets: 391 10*3/uL (ref 150–400)
RBC: 2.44 MIL/uL — ABNORMAL LOW (ref 4.22–5.81)
RDW: 15.2 % (ref 11.5–15.5)
WBC: 16.5 10*3/uL — ABNORMAL HIGH (ref 4.0–10.5)
nRBC: 0.2 % (ref 0.0–0.2)

## 2019-03-22 LAB — COMPREHENSIVE METABOLIC PANEL
ALT: 126 U/L — ABNORMAL HIGH (ref 0–44)
AST: 57 U/L — ABNORMAL HIGH (ref 15–41)
Albumin: 2.3 g/dL — ABNORMAL LOW (ref 3.5–5.0)
Alkaline Phosphatase: 73 U/L (ref 38–126)
Anion gap: 8 (ref 5–15)
BUN: 18 mg/dL (ref 6–20)
CO2: 19 mmol/L — ABNORMAL LOW (ref 22–32)
Calcium: 8.4 mg/dL — ABNORMAL LOW (ref 8.9–10.3)
Chloride: 116 mmol/L — ABNORMAL HIGH (ref 98–111)
Creatinine, Ser: 0.96 mg/dL (ref 0.61–1.24)
GFR calc Af Amer: >60 mL/min (ref >60)
GFR calc non Af Amer: >60 mL/min (ref >60)
Glucose, Bld: 102 mg/dL — ABNORMAL HIGH (ref 70–99)
Potassium: 3.9 mmol/L (ref 3.5–5.1)
Sodium: 143 mmol/L (ref 135–145)
Total Bilirubin: 0.8 mg/dL (ref 0.3–1.2)
Total Protein: 6.3 g/dL — ABNORMAL LOW (ref 6.5–8.1)

## 2019-03-22 LAB — AMMONIA: Ammonia: <9 umol/L — ABNORMAL LOW (ref 9–35)

## 2019-03-22 MED ORDER — LACTULOSE 10 GM/15ML PO SOLN
10.0000 g | Freq: Once | ORAL | Status: DC
  Filled 2019-03-22: qty 30

## 2019-03-22 MED ORDER — POLYETHYLENE GLYCOL 3350 17 G PO PACK: 17.0000 g | PACK | Freq: Once | ORAL | Status: DC

## 2019-03-22 MED ORDER — SENNA 8.6 MG PO TABS
1.0000 | ORAL_TABLET | Freq: Once | ORAL | Status: DC
  Filled 2019-03-22: qty 1

## 2019-03-22 MED ORDER — BISACODYL 5 MG PO TBEC: 5.0000 mg | DELAYED_RELEASE_TABLET | Freq: Once | ORAL | Status: DC

## 2019-03-22 NOTE — Progress Notes (Signed)
ID Fluctuating mental status.  Some days he is very alert other days he is drowsy. Refusing to eat No fever   Patient Vitals for the past 24 hrs:  BP Temp Temp src Pulse Resp SpO2  03/22/19 0852 - - - - - 100 %  03/22/19 0822 117/77 97.6 F (36.4 C) Oral (!) 117 18 100 %  03/22/19 0613 (!) 156/85 - - (!) 115 - -  03/22/19 0427 (!) 147/83 97.9 F (36.6 C) Oral (!) 116 20 94 %  03/22/19 0002 121/77 - - (!) 120 - -  03/21/19 2357 127/84 - - (!) 126 - -  03/21/19 2052 - - - - - 98 %  03/21/19 2021 130/85 (!) 97.1 F (36.2 C) Oral (!) 133 20 97 %  03/21/19 1723 134/86 - - (!) 126 - -    B/l inguinal surgical sites- clean and no erythema or discharge- no wound vac Penile erosions improving Foley catheter  CBC Latest Ref Rng & Units 03/22/2019 03/21/2019 03/20/2019  WBC 4.0 - 10.5 K/uL 16.5(H) 17.2(H) 19.4(H)  Hemoglobin 13.0 - 17.0 g/dL 8.3(L) 9.1(L) 9.0(L)  Hematocrit 39.0 - 52.0 % 25.7(L) 28.2(L) 27.8(L)  Platelets 150 - 400 K/uL 391 372 289   CMP Latest Ref Rng & Units 03/22/2019 03/21/2019 03/20/2019  Glucose 70 - 99 mg/dL 102(H) - 101(H)  BUN 6 - 20 mg/dL 18 - 22(H)  Creatinine 0.61 - 1.24 mg/dL 0.96 0.94 0.97  Sodium 135 - 145 mmol/L 143 - 147(H)  Potassium 3.5 - 5.1 mmol/L 3.9 - 4.7  Chloride 98 - 111 mmol/L 116(H) - 121(H)  CO2 22 - 32 mmol/L 19(L) - 19(L)  Calcium 8.9 - 10.3 mg/dL 8.4(L) - 8.8(L)  Total Protein 6.5 - 8.1 g/dL 6.3(L) - -  Total Bilirubin 0.3 - 1.2 mg/dL 0.8 - -  Alkaline Phos 38 - 126 U/L 73 - -  AST 15 - 41 U/L 57(H) - -  ALT 0 - 44 U/L 126(H) - -   Impression /recommendation 58 year old male with history of COPD, EtOH abuse, recent hospitalization for DVT, peripheral arterial disease presents with inability to walk with discoloration of the toes and and for hallucination   leukocytosis likely related to the ischemia of the toes with peripheral arterial disease.  Patienthasundergone extensive endarterectomies on bilateral lower extremities arteries  on 03/16/2019.  Leukocytosis is improving.  He does have ischemic /toes s especially on the right side.  The wound VAC has been removed.  He has completed 7 days of antibiotics today and they can be stopped as his cultures have all been negative.    Staph epidermidis in the urinary culture is a contaminant from the skin.  Penile erosions. Likely due to the condom cath use in the previous hospitalization.  No herpetic lesion or bacterial infection. He currently has a Foley catheter in place.  Stage II sacral decub   Failure to thrive   Delirium, hallucination could be due to the alcohol use also need to rule out Wernicke's encephalopathy or Korsakoff psychosis. HIV nonreactive in June. RPR NR_ Patient has been seen by neurologist and MRI has been requested EEG is also pending.__   palliative on board- appreciate their help  Discussed the management with his nurse and hospitalist  ID will sign off- call if needed

## 2019-03-22 NOTE — Progress Notes (Signed)
Subjective: Patient lethargic this morning but arousable.   Objective: Current vital signs: BP 117/77 (BP Location: Left Arm)   Pulse (!) 117   Temp 97.6 F (36.4 C) (Oral)   Resp 18   Ht 5\' 9"  (1.753 m)   Wt 49.2 kg   SpO2 100%   BMI 16.02 kg/m  Vital signs in last 24 hours: Temp:  [97.1 F (36.2 C)-97.9 F (36.6 C)] 97.6 F (36.4 C) (07/15 0822) Pulse Rate:  [115-133] 117 (07/15 0822) Resp:  [18-20] 18 (07/15 0822) BP: (117-156)/(77-86) 117/77 (07/15 0822) SpO2:  [94 %-100 %] 100 % (07/15 0852)  Intake/Output from previous day: 07/14 0701 - 07/15 0700 In: 1270.2 [I.V.:970.6; IV Piggyback:299.6] Out: 1300 [Urine:1300] Intake/Output this shift: Total I/O In: 10 [I.V.:10] Out: -  Nutritional status:  Diet Order            DIET - DYS 1 Room service appropriate? Yes with Assist; Fluid consistency: Nectar Thick  Diet effective now              Neurologic Exam: Mental Status: Lethargic but arousable.  Does not follow commands.  Minimal speech Cranial Nerves: II: Blinks to bilateral confrontation. Pupils equal, round, reactive to light and accommodation III,IV, VI: ptosis not present, extra-ocular motions grossly intact bilaterally V,VII: grimace symmetric Motor: Able to lift both upper extremities against gravity with no focal weakness noted.  Legs flexed in fetal position   Lab Results: Basic Metabolic Panel: Recent Labs  Lab 03/16/19 0557 03/17/19 0344 03/18/19 0330 03/19/19 0521 03/20/19 0459 03/21/19 0929 03/22/19 0600  NA 149* 151* 146*  --  147*  --  143  K 3.6 3.7 3.9  --  4.7  --  3.9  CL 118* 125* 120*  --  121*  --  116*  CO2 21* 20* 19*  --  19*  --  19*  GLUCOSE 94 114* 89  --  101*  --  102*  BUN 17 23* 23*  --  22*  --  18  CREATININE 0.83 0.90 0.89 0.91 0.97 0.94 0.96  CALCIUM 9.3 8.8* 8.7*  --  8.8*  --  8.4*  MG 2.0  --   --   --   --   --   --     Liver Function Tests: Recent Labs  Lab 03/22/19 0600  AST 57*  ALT 126*   ALKPHOS 73  BILITOT 0.8  PROT 6.3*  ALBUMIN 2.3*   No results for input(s): LIPASE, AMYLASE in the last 168 hours. Recent Labs  Lab 03/22/19 0600  AMMONIA <9*    CBC: Recent Labs  Lab 03/18/19 0330 03/19/19 0521 03/20/19 0459 03/21/19 0929 03/22/19 0600  WBC 23.0* 18.5* 19.4* 17.2* 16.5*  HGB 9.2* 8.9* 9.0* 9.1* 8.3*  HCT 28.4* 27.1* 27.8* 28.2* 25.7*  MCV 104.4* 103.8* 106.1* 106.0* 105.3*  PLT 243 252 289 372 391    Cardiac Enzymes: No results for input(s): CKTOTAL, CKMB, CKMBINDEX, TROPONINI in the last 168 hours.  Lipid Panel: No results for input(s): CHOL, TRIG, HDL, CHOLHDL, VLDL, LDLCALC in the last 168 hours.  CBG: No results for input(s): GLUCAP in the last 168 hours.  Microbiology: Results for orders placed or performed during the hospital encounter of 03/13/19  Urine culture     Status: Abnormal   Collection Time: 03/13/19  9:56 AM   Specimen: Urine, Clean Catch  Result Value Ref Range Status   Specimen Description   Final    URINE, CLEAN  CATCH Performed at [facility], [address] [place], [place] [address]    Special Requests   Final    NONE Performed at [facility], [place], [place] [address]    Culture >=100,000 COLONIES/mL STAPHYLOCOCCUS EPIDERMIDIS (A)  Final   Report Status 03/15/2019 FINAL  Final   Organism ID, Bacteria STAPHYLOCOCCUS EPIDERMIDIS (A)  Final      Susceptibility   Staphylococcus epidermidis - MIC*    CIPROFLOXACIN <=0.5 SENSITIVE Sensitive     GENTAMICIN <=0.5 SENSITIVE Sensitive     NITROFURANTOIN <=16 SENSITIVE Sensitive     OXACILLIN <=0.25 SENSITIVE Sensitive     TETRACYCLINE >=16 RESISTANT Resistant     VANCOMYCIN 2 SENSITIVE Sensitive     TRIMETH/SULFA <=10 SENSITIVE Sensitive     CLINDAMYCIN <=0.25 SENSITIVE Sensitive     RIFAMPIN <=0.5 SENSITIVE Sensitive     Inducible Clindamycin NEGATIVE Sensitive     * >=100,000 COLONIES/mL STAPHYLOCOCCUS EPIDERMIDIS  SARS  Coronavirus 2 (CEPHEID - Performed in [place] hospital lab), Hosp Order     Status: None   Collection Time: 03/13/19  9:56 AM   Specimen: Nasopharyngeal Swab  Result Value Ref Range Status   SARS Coronavirus 2 NEGATIVE NEGATIVE Final    Comment: (NOTE) If result is NEGATIVE SARS-CoV-2 target nucleic acids are NOT DETECTED. The SARS-CoV-2 RNA is generally detectable in upper and lower  respiratory specimens during the acute phase of infection. The lowest  concentration of SARS-CoV-2 viral copies this assay can detect is 250  copies / mL. A negative result does not preclude SARS-CoV-2 infection  and should not be used as the sole basis for treatment or other  patient management decisions.  A negative result may occur with  improper specimen collection / handling, submission of specimen other  than nasopharyngeal swab, presence of viral mutation(s) within the  areas targeted by this assay, and inadequate number of viral copies  (<250 copies / mL). A negative result must be combined with clinical  observations, patient history, and epidemiological information. If result is POSITIVE SARS-CoV-2 target nucleic acids are DETECTED. The SARS-CoV-2 RNA is generally detectable in upper and lower  respiratory specimens dur ing the acute phase of infection.  Positive  results are indicative of active infection with SARS-CoV-2.  Clinical  correlation with patient history and other diagnostic information is  necessary to determine patient infection status.  Positive results do  not rule out bacterial infection or co-infection with other viruses. If result is PRESUMPTIVE POSTIVE SARS-CoV-2 nucleic acids MAY BE PRESENT.   A presumptive positive result was obtained on the submitted specimen  and confirmed on repeat testing.  While 2019 novel coronavirus  (SARS-CoV-2) nucleic acids may be present in the submitted sample  additional confirmatory testing may be necessary for epidemiological  and / or  clinical management purposes  to differentiate between  SARS-CoV-2 and other Sarbecovirus currently known to infect humans.  If clinically indicated additional testing with an alternate test  methodology [phone]) is advised. The SARS-CoV-2 RNA is generally  detectable in upper and lower respiratory sp ecimens during the acute  phase of infection. The expected result is Negative. Fact Sheet for Patients:  StrictlyIdeas.no Fact Sheet for Healthcare Providers: BankingDealers.co.za This test is not yet approved or cleared by the [place] FDA and has been authorized for detection and/or diagnosis of SARS-CoV-2 by FDA under an Emergency Use Authorization (EUA).  This EUA will remain in effect (meaning this test can be used)  for the duration of the COVID-19 declaration under Section 564(b)(1) of the Act, 21 U.S.C. section 360bbb-3(b)(1), unless the authorization is terminated or revoked sooner. Performed at [facility], [address] [place], [place] [address]   Blood Culture (routine x 2)     Status: None   Collection Time: 03/13/19 10:39 AM   Specimen: BLOOD  Result Value Ref Range Status   Specimen Description BLOOD BLOOD RIGHT FOREARM  Final   Special Requests   Final    BOTTLES DRAWN AEROBIC AND ANAEROBIC Blood Culture results may not be optimal due to an excessive volume of blood received in culture bottles   Culture   Final    NO GROWTH 5 DAYS Performed at [facility], [address] [place], [place] [address]    Report Status 03/18/2019 FINAL  Final  Blood Culture (routine x 2)     Status: None   Collection Time: 03/13/19 10:39 AM   Specimen: BLOOD  Result Value Ref Range Status   Specimen Description BLOOD FOREARM  Final   Special Requests   Final    BOTTLES DRAWN AEROBIC AND ANAEROBIC Blood Culture adequate volume   Culture   Final    NO GROWTH 5 DAYS Performed at [facility], [address] [place], [place] [address]    Report Status 03/18/2019 FINAL  Final  MRSA PCR Screening     Status: Abnormal   Collection Time: 03/13/19  7:04 PM   Specimen: Nasal Mucosa; Nasopharyngeal  Result Value Ref Range Status   MRSA by PCR POSITIVE (A) NEGATIVE Final    Comment:        The GeneXpert MRSA Assay (FDA approved for NASAL specimens only), is one component of a comprehensive MRSA colonization surveillance program. It is not intended to diagnose MRSA infection nor to guide or monitor treatment for MRSA infections. RESULT CALLED TO, READ BACK BY AND VERIFIED WITH: VIVIAN ALI @2021  03/13/19 MJU Performed at [facility], [address] [place], [place] [address]   Aerobic Culture (superficial specimen)     Status: None   Collection Time: 03/16/19  9:52 PM   Specimen: Wound  Result Value Ref Range Status   Specimen Description   Final    WOUND Performed at [facility], [address], [place], [place] [address]    Special Requests   Final    NONE Performed at [facility], [address] [place], [place] [address]    Gram Stain NO WBC SEEN NO ORGANISMS SEEN   Final   Culture   Final    NO GROWTH 2 DAYS Performed at [facility], [place] [address] [place], [place] [address]    Report Status 03/19/2019 FINAL  Final  Hsv Culture And Typing     Status: None   Collection Time: 03/16/19  9:52 PM   Specimen: Penile; Other  Result Value Ref Range Status   HSV Culture/Type Comment  Final    Comment: (NOTE) Negative No Herpes simplex virus isolated. Performed At: [facility] [place], [place] [id number] Rush Farmer MD QZ:[id number]    Source of Sample PENIS  Final    Comment: Performed at [facility], [address] [place], [place] [address]    Coagulation Studies: No results for input(s): LABPROT, INR in the last 72 hours.  Imaging: Ct Head Wo Contrast  Result Date:  03/21/2019 CLINICAL DATA:  58 y/o M; acute delirium in urosepsis, sepsis  improved, persistent altered mental status. EXAM: CT HEAD WITHOUT CONTRAST TECHNIQUE: Contiguous axial images were obtained from the base of the skull through the vertex without intravenous contrast. COMPARISON:  03/13/2019 CT head FINDINGS: Brain: No evidence of acute infarction, hemorrhage, hydrocephalus, extra-axial collection or mass lesion/mass effect. 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 Stable chronic microvascular ischemic changes of white matter and volume loss of the brain. Vascular: Calcific atherosclerosis of internal carotid arteries and the vertebral arteries. Skull: Normal. Negative for fracture or focal lesion. Sinuses/Orbits: No acute finding. Other: Bilateral intra-ocular lens replacement. IMPRESSION: 1. No acute intracranial abnormality identified. 2. Stable chronic microvascular ischemic changes and volume loss of the brain. 3. 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 Electronically Signed   By: Kristine Garbe M.D.   On: 03/21/2019 19:19    Medications:  I have reviewed the patient's current medications. Scheduled: . amLODipine  10 mg Oral Daily  . apixaban  5 mg Oral BID  . aspirin EC  81 mg Oral Daily  . bisacodyl  5 mg Oral Once  . cholecalciferol  1,000 Units Oral Daily  . cloNIDine  0.1 mg Oral BID  . diltiazem  60 mg Oral Q8H  . feeding supplement (NEPRO CARB STEADY)  237 mL Oral BID BM  . folic acid  1 mg Oral Daily  . Gerhardt's butt cream   Topical TID  . ipratropium-albuterol  3 mL Nebulization BID  . megestrol  200 mg Oral BID  . metoprolol succinate  100 mg Oral Daily  . metoprolol succinate  50 mg Oral Daily  . multivitamin with minerals  1 tablet Oral Daily  . polyethylene glycol  17 g Oral Once  . sodium chloride flush  10-40 mL Intracatheter Q12H  . tamsulosin  0.4 mg Oral QPC supper  . thiamine  100 mg Oral  Daily  . vitamin C  250 mg Oral BID  . ziprasidone  10 mg Intramuscular Once    Assessment/Plan: Patient more lethargic today.  Otherwise exam unchanged.  Repeat head CT reviewed and shows no acute changes.  Afebrile.  WBC count improving although remains elevated.  On no sedating medications.  LFT's worsneing but patient with normal ammonia level.  Thiamine level pending.  EEG only significant for slowing.  Recommendations: 1. MRI of the brain without contrast 2. Thiamine level pending     LOS: 9 days   Alexis Goodell, MD Neurology [phone] 03/22/2019  12:08 PM

## 2019-03-22 NOTE — Progress Notes (Signed)
Palliative: Mr. Griffyn, Kucinski, is resting quietly in bed.  He appears chronically ill and frail, very thin.  He will make but not keep eye contact.  He is very difficult to understand today, but I believe, can make his basic needs known.  He remains with his lower extremities contracted.  He denies pain, but his right foot has signs and symptoms of vascular disease.  There is no family at bedside at this time due to visitor restrictions.  Call to mother and stepfather, Blanch Media and Cammie Mcgee at [phone].  We talked about Crisanto's continued frailty, neurology consult, watchful waiting for vascular problems of the toe/right foot.  We also talked about poor nutritional status, decreased functional status, frailty.  Family is agreeable for rehab at [facility], they feel like he would likely need to be transition to custodial/long-term care.  We talked about outpatient palliative services, family is agreeable.  I encourage Blanch Media and Mariea Clonts to consider how they will care for Maddix in the future when he become sick again.  Mariea Clonts states several times that he will "listen to the medical providers".  I encouraged them to keep [place] at the center of decision-making.  Plan: Agreeable to rehab/likely LTC at [facility].  Outpatient palliative to follow.  Watchful waiting for declines.   50 minutes [name], NP Palliative Medicine Team Team Phone # [phone] Greater than 50% of this time was spent counseling and coordinating care related to the above assessment and plan.

## 2019-03-22 NOTE — Progress Notes (Signed)
Physical Therapy Treatment Patient Details Name: Donald [name]. MRN: [id number] DOB: 12-30-1960 Today's Date: 03/22/2019    History of Present Illness Donald [name] is a 9yoM who comes to [facility] on 7/6 c continued BLE pain and AMS, found to be septic 2/2 UTI. PMH: PAD, ETOH, COPD, HTN, BPH. Pt underwent PTA in BLE/iliac AA on 7/6, and on 7/9 BLE endarterectomies (femoral/iliac) and stent placement (iliac and aortic). Pt was recently admitted 6/27-7/1 after fall/LOC and with ETOH withdrawl.    PT Comments    Patient is pleasant upon PT entering room, limited in ability to communicate with intermittent confusion. Patient is able to state that when moving slowly his legs hurt less, allowing PT to perform slow extension to reduce hip flexion/knee flexion contracture with verbalization of what PT was performing. Patient challenged with AAROM due to pain however with PT assistance for reduction of tone was able to move toes/ankles in lengthened position. Patient limited in tolerance of activities due to pain. Current POC remains appropriate at this time.     Follow Up Recommendations  Supervision for mobility/OOB;SNF;Supervision/Assistance - 24 hour     Equipment Recommendations  (TBD at next venue)    Recommendations for Other Services       Precautions / Restrictions Precautions Precautions: Fall Precaution Comments: confused, fidgeting, mittens donned. Restrictions Weight Bearing Restrictions: No    Mobility  Bed Mobility Overal bed mobility: Needs Assistance             General bed mobility comments: Pt very limited for bed mobility. Unable to attempt semi-sup to sit 2/2 increased pain/cognitive status. pt required mod assit for sup>sit.  Transfers                 General transfer comment: Deferred for pt safety.  Ambulation/Gait                 Stairs             Wheelchair Mobility    Modified Rankin (Stroke Patients Only)       Balance                                             Cognition Arousal/Alertness: Awake/alert Behavior During Therapy: Restless;Impulsive Overall Cognitive Status: Impaired/Different from baseline                   Orientation Level: Disoriented to;Time;Situation     Following Commands: Follows one step commands inconsistently;Follows one step commands with increased time Safety/Judgement: Decreased awareness of safety;Decreased awareness of deficits     General Comments: Patient confused. requests slow movement of LE's      Exercises Other Exercises Other Exercises: slow LE extension out of hip flexion and knee flexion to reduce flexion contractions of BLE, gentle hold at tolerable end range x 2 minutes x 4 trials. wiggling of toes/ankles at end range. Attempt AAROM heel slide with patient challenged and painful, improved with cueing for breathing and relaxation. IR/ER rolling of LE"s for tone reduction/pain reduction Other Exercises: bed mobility/positioning for improved alignment in bed, deferred OOB at this time.    General Comments        Pertinent Vitals/Pain Pain Assessment: Faces Faces Pain Scale: Hurts little more Pain Location: hurts with LE movement, less painful with slow gentle movements Pain Descriptors / Indicators: Constant;Grimacing Pain Intervention(s): Limited activity within  patient's tolerance;Other (comment)(slow steady stretch)    Home Living                      Prior Function            PT Goals (current goals can now be found in the care plan section) Progress towards PT goals: Not progressing toward goals - comment(limited ability to perform OOB)    Frequency    Min 2X/week      PT Plan Current plan remains appropriate    Co-evaluation              AM-PAC PT "6 Clicks" Mobility   Outcome Measure  Help needed turning from your back to your side while in a flat bed without using bedrails?: Total Help  needed moving from lying on your back to sitting on the side of a flat bed without using bedrails?: Total Help needed moving to and from a bed to a chair (including a wheelchair)?: Total Help needed standing up from a chair using your arms (e.g., wheelchair or bedside chair)?: Total Help needed to walk in hospital room?: Total Help needed climbing 3-5 steps with a railing? : Total 6 Click Score: 6    End of Session   Activity Tolerance: Patient limited by pain Patient left: in bed;with bed alarm set;with call bell/phone within reach Nurse Communication: Mobility status PT Visit Diagnosis: Unsteadiness on feet (R26.81);Other abnormalities of gait and mobility (R26.89);Muscle weakness (generalized) (M62.81);Difficulty in walking, not elsewhere classified (R26.2)     Time: 7412-8786 PT Time Calculation (min) (ACUTE ONLY): 16 min  Charges:  $Therapeutic Exercise: 8-22 mins                    Janna Arch, PT, DPT     Janna Arch 03/22/2019, 5:16 PM

## 2019-03-22 NOTE — Progress Notes (Signed)
Reevaluation of right foot wound.  The right third toe looks to be progressing with necrosis.  Also further progressive necrosis of the distal aspect of the right great toe.  Skin on the dorsal foot is still erythematous.  It is concerning for further necrosis in this region.  We will continue to hold debridement for now.  Awaiting further demarcation.  Patient is still quite confused and a poor historian.  Suspect he will need debridement in the future but this may take 1 to 2 weeks to completely demarcate.  Stable from podiatry standpoint when medically ready for discharge.  Can reconsult as needed.

## 2019-03-22 NOTE — Progress Notes (Addendum)
Speech Therapy note: reviewed chart notes; consulted NSG. Pt's oral diet was modified yesterday d/t pt's increased risk for aspiration thus potential pulmonary decline. Pt is mod-severely distracted and off topic to fully participate in tasks. Pt needs excessive cuing and encouragement for follow through. Pt's medical and Cognitive status' appear so declined which can significantly impact safety of swallowing. Per chart/Palliative care notes, family is considering how to care for Pilar Plate in light of his medical issues(ETOH abuse - recent admit forDTs, Severe, protein calorie malnutrition - likely 2/2 ETOH use, potential gangrene per podiatry, multiple wounds, deliriumurosepsis) and determining outcomes w/ Neurology, Podiatry, Vascular surgery results/recs.  Recommend pt continue a modified dysphagia diet of Dysphagia level 1 (puree) w/ Nectar liquids in order to reduce risk for oropharyngeal phase dysphagia and aspiration; pills crushed in puree. MD/NSG to reconsult ST services if/when pt's overall medical and Cognitive status' improve for safe reassessment of swallow function; diet upgrade. This diet consistency is recommended at this time in order to increase safety w/ oral intake. Recommend frequent oral care; assistance w/ meals.     Orinda Kenner, [place], CCC-SLP

## 2019-03-22 NOTE — Progress Notes (Addendum)
[place] at [place] NAME: Donald [name]    MR#:  [id number]  DATE OF BIRTH:  1961/02/22  SUBJECTIVE:  CHIEF COMPLAINT:   Chief Complaint  Patient presents with  . Altered Mental Status   Patient is pleasantly confused.  Mumbling, no agitation noticed REVIEW OF SYSTEMS:  Review of Systems  Unable to perform ROS: Acuity of condition  Respiratory: Negative for shortness of breath.   Cardiovascular: Negative for chest pain.    DRUG ALLERGIES:  No Known Allergies VITALS:  Blood pressure 121/68, pulse 91, temperature 97.8 F (36.6 C), temperature source Oral, resp. rate 16, height 5\' 9"  (1.753 m), weight 49.2 kg, SpO2 99 %. PHYSICAL EXAMINATION:  Physical Exam Vitals signs reviewed.  Constitutional:      General: He is not in acute distress.    Appearance: He is ill-appearing. He is not toxic-appearing.     Comments: malnourished  HENT:     Head: Normocephalic and atraumatic.  Cardiovascular:     Rate and Rhythm: Normal rate and regular rhythm.     Comments: Some more color to BLLE with purple discoloration still present along toes Pulmonary:     Effort: Pulmonary effort is normal.     Breath sounds: Normal breath sounds.  Abdominal:     General: Abdomen is flat.     Palpations: Abdomen is soft.  Neurological:     General: No focal deficit present.     Comments: A&O to self only    LABORATORY PANEL:  Male CBC Recent Labs  Lab 03/22/19 0600  WBC 16.5*  HGB 8.3*  HCT 25.7*  PLT 391   ------------------------------------------------------------------------------------------------------------------ Chemistries  Recent Labs  Lab 03/16/19 0557  03/22/19 0600  NA 149*   < > 143  K 3.6   < > 3.9  CL 118*   < > 116*  CO2 21*   < > 19*  GLUCOSE 94   < > 102*  BUN 17   < > 18  CREATININE 0.83   < > 0.96  CALCIUM 9.3   < > 8.4*  MG 2.0  --   --   AST  --   --  57*  ALT  --   --  126*  ALKPHOS  --   --  73  BILITOT   --   --  0.8   < > = values in this interval not displayed.   RADIOLOGY:  No results found. ASSESSMENT AND PLAN:  ZAMAR ODWYER Jr.is a 58yo male with a PMH of PAD, h/o ETOH use disorder, COPD, HTN, BPH, h/o tobacco use disorder, h/o seizure, and protein-calorie malnutrition who presented with worsening AMS found to be septic.  1.  Acute deliriumurosepsis, with history of severe alcohol abuse  sepsis improved;  Leukocytosis: pt remains afebrile but still altered - ID following, appreciate recommednations and patient restarted on cefepime and vanc - Leukocytosis believed to be 2/2 PAD and ischemia, improved after procedure; WBCs (26.4-23.2-17.2-16.5 - CXR with no signs of acute process, procalcitonin 0.53>0.34  -Blood cultures no growth, aerobic cultures with no growth - Urine culturewith staphepidermidis, which is likely contaminate per ID - MRSA PCRpositive - IVFs and PRN bolus given to keep MAP >60 per sepsis protocol - palliative care following, patient now DNR -B12 and folate levels are normal.  B1 level is pending.  Ammonia level normal  -Seen by neurology -EEG significant for slowing,  repeat brain imaging-CT scan No acute  abnormalities.  MRI of the brain ordered -Completed 7 days of antibiotic course and antibiotics discontinued.  ID signed off  2.Tachycardia -IV fluid bolus given and continue IV fluids -Monitor patient telemetry - EKG-with sinus tachycardia seen by cardiology -Dr. Clayborn Bigness appreciate their recommendations -IV Lopressor as needed.  Toprol dose increased 200 mg we will continue close monitoring.  Tachycardia improved  3.PAD with ischemic feet BL s/p PCI 03/13/19, for revascularization on 03/16/2019 - Vascular surgeryfollowing-patient to go forsurgery for revascularization with possible femoral endarterectomy/extensive aortoiliac stenting -Heparin drip discontinued and changed to Eliquis.  Plavix discontinued.  Continue baby aspirin as per my  discussion with vascular surgery -Wound vacs from the the groin removed   4. ETOH abuse - Recent hospitalizationforDTs -Noactivesigns of withdrawal, CIWA monitoring - Check daily BMP+Mg- wnl 7/9 - Daily Folate, MVI;continued high-dose IV thiamine x3 days - PT/OT evaluation for mobility-  5. Penile/ sacral (stage II)/ scrotal decubiti ulcerss/p revascularization  - woundcare  -Foley catheter in place to help keep the area dry - Likely due to the condom cath use in the previous hospitalization -   6. Hypokalemia Hypomagnesemia -Monitor K and mag, repleteas indicated  7.HTN-Goal BP <140/90 -OnClonidine, metoprolol and amlodipine - IV labetalol PRN  8.Severe, protein calorie malnutrition- likely 2/2 ETOH use disorder - nutritionfollowing, supplements -Speech therapy recommending modified dysphagia diet with a dysphagia level 1 pured with nectar liquids.  Frequent oral care and assistance with meals  9.  Right foot third toe discoloration-podiatry consult placed.  Most likely will progress to gangrene per podiatry.  Awaiting for clear demarcation which will take approximately 1 to 2 weeks no other interventions are recommended at this point from podiatry or vascular standpoint.  Podiatry signed off Heparin drip changed to Eliquis  Disposition skilled nursing facility with 24-hour supervision.  Family is agreeable for rehab at [facility]  Continue outpatient palliative care  All the records are reviewed and case discussed with Care Management/Social Worker. Management plans discussed with the patient, mom over phone and they are in agreement.  CODE STATUS: DNR  POSSIBLE D/C IN 2-3 DAYS, DEPENDING ON CLINICAL CONDITION.  [name], DO PGY-3, [name]  03/22/2019 at 8:45 PM  Between 7am to 6pm - Pager - [phone]  After 6pm go to [email or web address] - Proofreader  [facility]  Office   [phone]  CC: Primary care physician; Olin Hauser, DO

## 2019-03-22 NOTE — Progress Notes (Signed)
Notify Gardiner Barefoot NP if patient can have IV Ativan before he goes to MRI of his brain, awaiting for her response. Page her again and sent it to RN's phone, let RN know.

## 2019-03-22 NOTE — TOC Progression Note (Signed)
Transition of Care (TOC) - Progression Note    Patient Details  Name: Donald [name]. MRN: [id number] Date of Birth: 1960/12/29  Transition of Care [facility]) CM/SW Contact  Elza Rafter, RN Phone Number: 03/22/2019, 12:30 PM  Clinical Narrative:   [name] to discharge-Pending EEG and CT scan per MD.  Damaris Schooner with [facility] and they can accept him when medically ready with confusion as long as no behavioral issues.      Expected Discharge Plan: [place] Barriers to Discharge: Continued Medical Work up  Expected Discharge Plan and Services Expected Discharge Plan: [place] In-house Referral: Clinical Social Work   Post Acute Care Choice: [place] Living arrangements for the past 2 months: Single Family Home                                       Social Determinants of Health (SDOH) Interventions    Readmission Risk Interventions Readmission Risk Prevention Plan 03/14/2019  Transportation Screening Complete  Home Care Screening Not Complete  Home Care Screening Not Completed Comments Pt needs SNF placement  Medication Review (RN CM) Complete  Some recent data might be hidden

## 2019-03-23 ENCOUNTER — Inpatient Hospital Stay
Admit: 2019-03-23 | Discharge: 2019-03-23 | Disposition: A | Discharge status: Home / Self Care | Payer: Medicaid Other | Attending: Internal Medicine | Admitting: Internal Medicine

## 2019-03-23 ENCOUNTER — Inpatient Hospital Stay: Payer: Medicaid Other

## 2019-03-23 DIAGNOSIS — J189 Pneumonia, unspecified organism: Secondary | ICD-10-CM

## 2019-03-23 LAB — COMPREHENSIVE METABOLIC PANEL
ALT: 116 U/L — ABNORMAL HIGH (ref 0–44)
AST: 62 U/L — ABNORMAL HIGH (ref 15–41)
Albumin: 2.1 g/dL — ABNORMAL LOW (ref 3.5–5.0)
Alkaline Phosphatase: 64 U/L (ref 38–126)
Anion gap: 7 (ref 5–15)
BUN: 17 mg/dL (ref 6–20)
CO2: 20 mmol/L — ABNORMAL LOW (ref 22–32)
Calcium: 8.5 mg/dL — ABNORMAL LOW (ref 8.9–10.3)
Chloride: 113 mmol/L — ABNORMAL HIGH (ref 98–111)
Creatinine, Ser: 0.95 mg/dL (ref 0.61–1.24)
GFR calc Af Amer: >60 mL/min (ref >60)
GFR calc non Af Amer: >60 mL/min (ref >60)
Glucose, Bld: 102 mg/dL — ABNORMAL HIGH (ref 70–99)
Potassium: 4 mmol/L (ref 3.5–5.1)
Sodium: 140 mmol/L (ref 135–145)
Total Bilirubin: 0.9 mg/dL (ref 0.3–1.2)
Total Protein: 5.8 g/dL — ABNORMAL LOW (ref 6.5–8.1)

## 2019-03-23 LAB — CBC
HCT: 25.1 % — ABNORMAL LOW (ref 39.0–52.0)
Hemoglobin: 8 g/dL — ABNORMAL LOW (ref 13.0–17.0)
MCH: 33.8 pg (ref 26.0–34.0)
MCHC: 31.9 g/dL (ref 30.0–36.0)
MCV: 105.9 fL — ABNORMAL HIGH (ref 80.0–100.0)
Platelets: 419 10*3/uL — ABNORMAL HIGH (ref 150–400)
RBC: 2.37 MIL/uL — ABNORMAL LOW (ref 4.22–5.81)
RDW: 15.4 % (ref 11.5–15.5)
WBC: 16.9 10*3/uL — ABNORMAL HIGH (ref 4.0–10.5)
nRBC: 0 % (ref 0.0–0.2)

## 2019-03-23 LAB — VITAMIN B1: Vitamin B1 (Thiamine): 262.4 nmol/L — ABNORMAL HIGH (ref 66.5–200.0)

## 2019-03-23 MED ORDER — SENNA 8.6 MG PO TABS
1.0000 | ORAL_TABLET | Freq: Once | ORAL | Status: AC
  Administered 2019-03-23: 8.6 mg via ORAL
  Filled 2019-03-23: qty 1

## 2019-03-23 MED ORDER — IPRATROPIUM-ALBUTEROL 0.5-2.5 (3) MG/3ML IN SOLN: 3.0000 mL | RESPIRATORY_TRACT | Status: DC | PRN

## 2019-03-23 MED ORDER — LORAZEPAM 2 MG/ML IJ SOLN
0.5000 mg | Freq: Once | INTRAMUSCULAR | Status: AC
  Administered 2019-03-23: 0.5 mg via INTRAVENOUS
  Filled 2019-03-23: qty 1

## 2019-03-23 MED ORDER — SENNA 8.6 MG PO TABS
1.0000 | ORAL_TABLET | Freq: Every day | ORAL | Status: DC
  Administered 2019-03-24: 8.6 mg via ORAL
  Filled 2019-03-23: qty 1

## 2019-03-23 MED ORDER — POLYETHYLENE GLYCOL 3350 17 G PO PACK
17.0000 g | PACK | Freq: Every day | ORAL | Status: DC
  Administered 2019-03-24: 17 g via ORAL
  Filled 2019-03-23: qty 1

## 2019-03-23 NOTE — Progress Notes (Signed)
*  PRELIMINARY RESULTS* Echocardiogram 2D Echocardiogram has been performed.  Donald [name] 03/23/2019, 5:59 PM

## 2019-03-23 NOTE — Progress Notes (Signed)
Notify Gardiner Barefoot, NP about patient's BP at 109/67, HR 103 has scheduled cardizem 60 mg and clonidine 0.1 mg, order to hold clonidine and to give cardizem. RN will continue to monitor.

## 2019-03-23 NOTE — Progress Notes (Signed)
Occupational Therapy Treatment Patient Details Name: Donald [name]. MRN: [id number] DOB: [birth year] Today's Date: 03/23/2019    History of present illness Torri Langston is a 89yoM who comes to [facility] on 7/6 c continued BLE pain and AMS, found to be septic 2/2 UTI. PMH: PAD, ETOH, COPD, HTN, BPH. Pt underwent PTA in BLE/iliac AA on 7/6, and on 7/9 BLE endarterectomies (femoral/iliac) and stent placement (iliac and aortic). Pt was recently admitted 6/27-7/1 after fall/LOC and with ETOH withdrawl.   OT comments  Pt seen for ADLs and hand to mouth since he had lunch tray bedside and was reaching for Ensure.  Spoke to nurse Ria Comment and she said it was [facility] to remove hand mitt while working iwth him.  He was angry at first and seemed to be upset about not being able to feed himself and was imitating motions like he was being treated like a child.  He needed min assist to open Ensure and able to drink with RUE and hand with only supervision and no signs of swallowing problems and no wet vocal quality or cough.  SInce NSG indicated it was hard to get hand mitts back on him, only R one was removed to complete face washing and drying and oral swab to mouth. Tasks take a long time for sequencing and processing but able to complete with only set up and min cues.  Pt declined further UE exercises after this to work on increasing functional strength.  Rec OT start to work on patient feeding self vs someone feeding him as tolerated.    Follow Up Recommendations  SNF;Supervision/Assistance - 24 hour    Equipment Recommendations  3 in 1 bedside commode    Recommendations for Other Services      Precautions / Restrictions Precautions Precautions: Fall Precaution Comments: confused, fidgeting, mittens donned. Restrictions Weight Bearing Restrictions: No       Mobility Bed Mobility                  Transfers                      Balance                                            ADL either performed or assessed with clinical judgement   ADL                                         General ADL Comments: Pt seen for ADLs and hand to mouth since he had lunch tray bedside and was reaching for Ensure.  Spoke to nurse Ria Comment and she said it was [facility] to remove hand mitt while working iwth him.  He was angry at first and seemed to be upset about not being able to feed himself and was imitating motions like he was being treated like a child.  He needed min assist to open Ensure and able to drink with RUE and hand with only supervision and no signs of swallowing problems and no wet vocal quality or cough.  SInce NSG indicated it was hard to get hand mitts back on him, only R one was removed to complete face washing and drying and oral swab to mouth. Tasks take a long  time for sequencing and processing but able to complete with only set up and min cues.  Pt declined further UE exercises after this to work on increasing functional strength.     Vision       Perception     Praxis      Cognition Arousal/Alertness: Awake/alert Behavior During Therapy: Restless;Impulsive Overall Cognitive Status: Impaired/Different from baseline Area of Impairment: Memory;Safety/judgement;Orientation;Following commands;Problem solving                 Orientation Level: Disoriented to;Time;Situation Current Attention Level: Selective;Alternating;Divided Memory: Decreased recall of precautions;Decreased short-term memory Following Commands: Follows one step commands with increased time;Follows one step commands consistently Safety/Judgement: Decreased awareness of safety;Decreased awareness of deficits   Problem Solving: Requires verbal cues;Requires tactile cues;Slow processing;Difficulty sequencing;Decreased initiation General Comments: Needs extra time for processing and is hesitant to participate at first but does well with supportive listening and  explanation.        Exercises     Shoulder Instructions       General Comments      Pertinent Vitals/ Pain       Pain Assessment: No/denies pain  Home Living                                          Prior Functioning/Environment              Frequency  Min 1X/week        Progress Toward Goals  OT Goals(current goals can now be found in the care plan section)  Progress towards OT goals: Progressing toward goals  Acute Rehab OT Goals Patient Stated Goal: To do more for myself again OT Goal Formulation: With patient Time For Goal Achievement: 04/01/19 Potential to Achieve Goals: [place] Discharge plan remains appropriate    Co-evaluation                 AM-PAC OT "6 Clicks" Daily Activity     Outcome Measure   Help from another person eating meals?: A Lot Help from another person taking care of personal grooming?: A Little Help from another person toileting, which includes using toliet, bedpan, or urinal?: Total Help from another person bathing (including washing, rinsing, drying)?: Total Help from another person to put on and taking off regular upper body clothing?: A Lot Help from another person to put on and taking off regular lower body clothing?: Total 6 Click Score: 10    End of Session    OT Visit Diagnosis: Other abnormalities of gait and mobility (R26.89);Muscle weakness (generalized) (M62.81);Pain   Activity Tolerance Patient tolerated treatment well   Patient Left in bed;with call bell/phone within reach;with bed alarm set;Other (comment)(B hand mitts reapplied)   Nurse Communication          Time: 5625-6389 OT Time Calculation (min): 29 min  Charges: OT General Charges $OT Visit: 1 Visit OT Treatments $Self Care/Home Management : 23-37 mins  Chrys Racer, OTR/L, [place] ascom [phone] 03/23/19, 2:50 PM

## 2019-03-23 NOTE — Progress Notes (Signed)
Nutrition Follow Up Note   DOCUMENTATION CODES:   Severe malnutrition in context of chronic illness  INTERVENTION:   Nepro Shake po BID, each supplement provides 425 kcal and 19 grams protein  Magic cup TID with meals, each supplement provides 290 kcal and 9 grams of protein  Continue thiamine, MVI and folic acid daily in setting of etoh abuse.  Vitamin C 250mg  po BID  Megace 200mg  po BID  Bowel regimen per MD  Monitor potassium, magnesium, and phosphorus as patient is at risk for refeeding syndrome.  NUTRITION DIAGNOSIS:   Severe Malnutrition related to chronic illness(emphysema, etoh abuse) as evidenced by severe muscle depletion, severe fat depletion.  GOAL:   Patient will meet greater than or equal to 90% of their needs -progressing   MONITOR:   PO intake, Supplement acceptance, Labs, Weight trends, Skin, I & O's  ASSESSMENT:   58 y.o. male with pertinent past medical history of emphysema, hypertension, BPH, former tobacco abuse, alcohol abuse, peripheral neuropathy, PVD, anemia, sensory ataxia, colon cancer, depression, hypertension, and seizure presenting to the ED with altered mental status.   Pt continues to be confused. Appetite improved slightly with addition of megace. Pt changed to dysphagia 1/nectar thick diet; Ensure changed to Nepro as this is nectar thick. Pt is drinking some Nepro. Magic Cups being added to meal trays. Pt is at high refeed risk; recommend monitor electrolytes. No BM noted since admit; MD notified. Recommend bowel regimen per MD. No new weight since 7/6; weekly weight requested.   Medications reviewed and include: aspirin, D3, plavix, folic acid, lactulose, megace, MVI, thiamine, vitamin C, NaCl @75ml /hr  Labs reviewed: K 4.0 wnl Wbc- 16.9(H), Hgb 8.0(L), Hct 25.1(L)  Diet Order:   Diet Order            DIET - DYS 1 Room service appropriate? Yes with Assist; Fluid consistency: Nectar Thick  Diet effective now              EDUCATION NEEDS:   Education needs have been addressed  Skin:  Skin Assessment: Reviewed RN Assessment(Coccyx: 1cm x 0.5cm x 0.2cm with wound bed partially obscured by the presence of yellow fibrinous slough. Stage 3, incision groin) VAC  Last BM:  unknown  Height:   Ht Readings from Last 1 Encounters:  03/13/19 5\' 9"  (1.753 m)   Weight:   Wt Readings from Last 1 Encounters:  03/13/19 49.2 kg   Ideal Body Weight:  72.7 kg  BMI:  Body mass index is 16.02 kg/m.  Estimated Nutritional Needs:   Kcal:  1700-2000kcal/day  Protein:  85-100g/day  Fluid:  >1.5L/day  Koleen Distance MS, RD, LDN Pager #- [phone] Office#- [phone] After Hours Pager: [phone]

## 2019-03-23 NOTE — Progress Notes (Addendum)
[place] at [place] NAME: Donald [name]    MR#:  [id number]  DATE OF BIRTH:  1961/04/28  SUBJECTIVE:  CHIEF COMPLAINT:   Chief Complaint  Patient presents with  . Altered Mental Status   Patient is pleasantly confused, repeat CT head is negative REVIEW OF SYSTEMS:  Review of Systems  Unable to perform ROS: Acuity of condition  Respiratory: Negative for shortness of breath.   Cardiovascular: Negative for chest pain.    DRUG ALLERGIES:  No Known Allergies VITALS:  Blood pressure 113/71, pulse (!) 103, temperature 97.9 F (36.6 C), temperature source Oral, resp. rate 19, height 5\' 9"  (1.753 m), weight 49.2 kg, SpO2 98 %. PHYSICAL EXAMINATION:  Physical Exam Vitals signs reviewed.  Constitutional:      General: He is not in acute distress.    Appearance: He is ill-appearing. He is not toxic-appearing.     Comments: malnourished  HENT:     Head: Normocephalic and atraumatic.  Cardiovascular:     Rate and Rhythm: Normal rate and regular rhythm.     Comments: Some more color to BLLE with purple discoloration still present along toes Pulmonary:     Effort: Pulmonary effort is normal.     Breath sounds: Normal breath sounds.  Abdominal:     General: Abdomen is flat.     Palpations: Abdomen is soft.  Neurological:     General: No focal deficit present.     Comments: A&O to self only    LABORATORY PANEL:  Male CBC Recent Labs  Lab 03/23/19 0357  WBC 16.9*  HGB 8.0*  HCT 25.1*  PLT 419*   ------------------------------------------------------------------------------------------------------------------ Chemistries  Recent Labs  Lab 03/23/19 0357  NA 140  K 4.0  CL 113*  CO2 20*  GLUCOSE 102*  BUN 17  CREATININE 0.95  CALCIUM 8.5*  AST 62*  ALT 116*  ALKPHOS 64  BILITOT 0.9   RADIOLOGY:  Mr Brain Wo Contrast  Result Date: 03/23/2019 CLINICAL DATA:  Encephalopathy EXAM: MRI HEAD WITHOUT CONTRAST TECHNIQUE:  Multiplanar, multiecho pulse sequences of the brain and surrounding structures were obtained without intravenous contrast. COMPARISON:  Head CT from 2 days ago FINDINGS: Brain: Subcentimeter acute infarct along the right parietal cortex. Diffusion hyperintense focus in the left posterior cerebral white matter on coronal diffusion imaging has no ADC correlate, suspect shine through. Chronic lacunes in the right thalamus, left corona radiata, bilateral pons, and left centrum semiovale. There is superimposed ischemic gliosis in the white matter that is confluent. Generalized atrophy. No evidence of acute hemorrhage, hydrocephalus, collection, or mass. Vascular: Grossly preserved major flow voids Skull and upper cervical spine: No gross bony lesion Sinuses/Orbits: Bilateral cataract resection Other: Significantly motion degraded study which was truncated. IMPRESSION: 1. Subcentimeter acute infarct in the low right parietal cortex. 2. Background of atrophy and chronic small vessel ischemia that is advanced-especially for age. 3. Motion degraded and truncated study. Electronically Signed   By: Monte Fantasia M.D.   On: 03/23/2019 08:55   ASSESSMENT AND PLAN:  IRVAN TIEDT Jr.is a 58yo male with a PMH of PAD, h/o ETOH use disorder, COPD, HTN, BPH, h/o tobacco use disorder, h/o seizure, and protein-calorie malnutrition who presented with worsening AMS found to be septic.  #.  Acute deliriumurosepsis, with history of severe alcohol abuse  sepsis improved; with new CVA per MRI of the brain  Leukocytosis: pt remains afebrile but still altered - ID following, appreciate recommednations and  patient restarted on cefepime and vanc - Leukocytosis believed to be 2/2 PAD and ischemia, improved after procedure; WBCs (26.4-23.2-17.2-16.5 - CXR with no signs of acute process, procalcitonin 0.53>0.34  -Blood cultures no growth, aerobic cultures with no growth - Urine culturewith staphepidermidis, which is likely  contaminate per ID - MRSA PCRpositive - IVFs and PRN bolus given to keep MAP >60 per sepsis protocol - palliative care following, patient now DNR -B12 and folate levels are normal.  B1 level is pending.  Ammonia level normal  -Completed 7 days of antibiotic course and antibiotics discontinued.  ID signed off  - #Acute CVA Repeat CT head is negative but MRI of the brain has revealed a new subcentimeter acute infarct in the low right parietal cortex Seen by neurology -EEG significant for slowing,  repeat brain imaging-CT scan no acute findings Continue neurochecks, follow-up with neurology, carotid Dopplers and 2D echocardiogram Seen by speech therapy on 03/22/2019  #Tachycardia -IV fluid bolus given and continue IV fluids -Monitor patient telemetry - EKG-with sinus tachycardia seen by cardiology -Dr. Clayborn Bigness appreciate their recommendations -IV Lopressor as needed.  Toprol dose increased 200 mg we will continue close monitoring.  Tachycardia improved  3.PAD with ischemic feet BL s/p PCI 03/13/19, for revascularization on 03/16/2019 - Vascular surgeryfollowing-patient to go forsurgery for revascularization with possible femoral endarterectomy/extensive aortoiliac stenting -Heparin drip discontinued and changed to Eliquis.  Plavix discontinued.  Continue baby aspirin as per my discussion with vascular surgery -Wound vacs from the the groin removed   4. ETOH abuse - Recent hospitalizationforDTs -Noactivesigns of withdrawal, CIWA monitoring - Check daily BMP+Mg- wnl 7/9 - Daily Folate, MVI;continued high-dose IV thiamine x3 days - PT/OT evaluation for mobility-  5. Penile/ sacral (stage II)/ scrotal decubiti ulcerss/p revascularization  - woundcare  -Foley catheter in place to help keep the area dry - Likely due to the condom cath use in the previous hospitalization -   6. Hypokalemia Hypomagnesemia -Monitor K and mag, repleteas indicated  7.HTN-Goal  BP <140/90 -OnClonidine, metoprolol and amlodipine - IV labetalol PRN  8.Severe, protein calorie malnutrition- likely 2/2 ETOH use disorder - nutritionfollowing, supplements -Speech therapy recommending modified dysphagia diet with a dysphagia level 1 pured with nectar liquids.  Frequent oral care and assistance with meals  9.  Right foot third toe discoloration-podiatry consult placed.  Most likely will progress to gangrene per podiatry.  Awaiting for clear demarcation which will take approximately 1 to 2 weeks no other interventions are recommended at this point from podiatry or vascular standpoint.  Podiatry signed off Heparin drip changed to Eliquis  Disposition skilled nursing facility with 24-hour supervision.  Family is agreeable for rehab at [facility].  Continue outpatient palliative care  All the records are reviewed and case discussed with Care Management/Social Worker. Management plans discussed with the patient, mom over phone and they are in agreement.  CODE STATUS: DNR  POSSIBLE D/C IN 2-3 DAYS, DEPENDING ON CLINICAL CONDITION.  [name], DO PGY-3, [name]  03/23/2019 at 2:54 PM  Between 7am to 6pm - Pager - [phone]  After 6pm go to [email or web address] - Proofreader  [facility] [place] [facility]  Office  [phone]  CC: Primary care physician; Olin Hauser, DO

## 2019-03-23 NOTE — Progress Notes (Signed)
Palliative: [name], Donald [name], is resting quietly in bed.  He greets me making and somewhat keeping eye contact.  Donald [name] is oriented to self only at this time.  He continues to look acutely/chronically ill and frail.  He is somewhat contracted wound in his lower extremities.  Right third toe continues to look gangrenous changes, but no pain.  There is no family at bedside at this time due to visitor restrictions.  Donald [name] and I talked about his discharge to rehab.  Donald [name] shares that he does not want to go to rehab, and I share that he is unable to care for himself at home.  Donald [name] tells me that he also told his mother that he does not want to go to rehab.  We talked about his gangrenous toe, and at this point Donald [name] is agreeable to any amputations as needed to save his life.  Plan is to reach out to mother and stepfather tomorrow.  Plan: Continue to treat the treatable but no CPR, no intubation.  At this point Donald [name] is agreeable to surgical intervention if needed on right foot.  Discharged to rehab.  25 minutes [name], NP Palliative Medicine Team Team Phone # [phone] Greater than 50% of this time was spent counseling and coordinating care related to the above assessment and plan.

## 2019-03-24 DIAGNOSIS — I639 Cerebral infarction, unspecified: Secondary | ICD-10-CM

## 2019-03-24 LAB — COMPREHENSIVE METABOLIC PANEL
ALT: 96 U/L — ABNORMAL HIGH (ref 0–44)
AST: 46 U/L — ABNORMAL HIGH (ref 15–41)
Albumin: 2.3 g/dL — ABNORMAL LOW (ref 3.5–5.0)
Alkaline Phosphatase: 62 U/L (ref 38–126)
Anion gap: 8 (ref 5–15)
BUN: 14 mg/dL (ref 6–20)
CO2: 18 mmol/L — ABNORMAL LOW (ref 22–32)
Calcium: 8.5 mg/dL — ABNORMAL LOW (ref 8.9–10.3)
Chloride: 111 mmol/L (ref 98–111)
Creatinine, Ser: 0.89 mg/dL (ref 0.61–1.24)
GFR calc Af Amer: >60 mL/min (ref >60)
GFR calc non Af Amer: >60 mL/min (ref >60)
Glucose, Bld: 93 mg/dL (ref 70–99)
Potassium: 3.5 mmol/L (ref 3.5–5.1)
Sodium: 137 mmol/L (ref 135–145)
Total Bilirubin: 0.8 mg/dL (ref 0.3–1.2)
Total Protein: 5.8 g/dL — ABNORMAL LOW (ref 6.5–8.1)

## 2019-03-24 LAB — CBC
HCT: 23.6 % — ABNORMAL LOW (ref 39.0–52.0)
Hemoglobin: 7.9 g/dL — ABNORMAL LOW (ref 13.0–17.0)
MCH: 34.6 pg — ABNORMAL HIGH (ref 26.0–34.0)
MCHC: 33.5 g/dL (ref 30.0–36.0)
MCV: 103.5 fL — ABNORMAL HIGH (ref 80.0–100.0)
Platelets: 485 10*3/uL — ABNORMAL HIGH (ref 150–400)
RBC: 2.28 MIL/uL — ABNORMAL LOW (ref 4.22–5.81)
RDW: 15.2 % (ref 11.5–15.5)
WBC: 15.2 10*3/uL — ABNORMAL HIGH (ref 4.0–10.5)
nRBC: 0 % (ref 0.0–0.2)

## 2019-03-24 LAB — SARS CORONAVIRUS 2 BY RT PCR (HOSPITAL ORDER, PERFORMED IN ~~LOC~~ HOSPITAL LAB): SARS Coronavirus 2: NEGATIVE

## 2019-03-24 LAB — ECHOCARDIOGRAM COMPLETE
Height: 69 in
Weight: 1735.46 oz

## 2019-03-24 LAB — LIPID PANEL
Cholesterol: 113 mg/dL (ref 0–200)
HDL: 25 mg/dL — ABNORMAL LOW (ref >40)
LDL Cholesterol: 60 mg/dL (ref 0–99)
Total CHOL/HDL Ratio: 4.5 RATIO
Triglycerides: 140 mg/dL (ref <150)
VLDL: 28 mg/dL (ref 0–40)

## 2019-03-24 LAB — MAGNESIUM: Magnesium: 1.7 mg/dL (ref 1.7–2.4)

## 2019-03-24 LAB — PHOSPHORUS: Phosphorus: 3.5 mg/dL (ref 2.5–4.6)

## 2019-03-24 MED ORDER — NEPRO/CARBSTEADY PO LIQD: 237.0000 mL | Freq: Two times a day (BID) | ORAL | 0 refills | Status: DC

## 2019-03-24 MED ORDER — DILTIAZEM HCL 30 MG PO TABS: 60.0000 mg | ORAL_TABLET | Freq: Three times a day (TID) | ORAL | Status: DC

## 2019-03-24 MED ORDER — ASPIRIN 81 MG PO TBEC: 81.0000 mg | DELAYED_RELEASE_TABLET | Freq: Every day | ORAL | Status: DC

## 2019-03-24 MED ORDER — ADULT MULTIVITAMIN W/MINERALS CH: 1.0000 | ORAL_TABLET | Freq: Every day | ORAL | Status: DC

## 2019-03-24 MED ORDER — ATORVASTATIN CALCIUM 40 MG PO TABS: 40.0000 mg | ORAL_TABLET | Freq: Every day | ORAL | Status: DC

## 2019-03-24 MED ORDER — THIAMINE HCL 100 MG PO TABS: 100.0000 mg | ORAL_TABLET | Freq: Every day | ORAL | Status: DC

## 2019-03-24 MED ORDER — DILTIAZEM HCL ER COATED BEADS 240 MG PO CP24: 180.0000 mg | ORAL_CAPSULE | Freq: Every day | ORAL | Status: AC

## 2019-03-24 MED ORDER — FOLIC ACID 1 MG PO TABS: 1.0000 mg | ORAL_TABLET | Freq: Every day | ORAL | Status: DC

## 2019-03-24 MED ORDER — METOPROLOL SUCCINATE ER 50 MG PO TB24: 100.0000 mg | ORAL_TABLET | Freq: Every day | ORAL | 1 refills | Status: DC

## 2019-03-24 MED ORDER — GERHARDT'S BUTT CREAM: 1.0000 "application " | TOPICAL_CREAM | Freq: Three times a day (TID) | CUTANEOUS | Status: DC

## 2019-03-24 MED ORDER — APIXABAN 5 MG PO TABS: 5.0000 mg | ORAL_TABLET | Freq: Two times a day (BID) | ORAL | Status: DC

## 2019-03-24 MED ORDER — POLYETHYLENE GLYCOL 3350 17 G PO PACK: 17.0000 g | PACK | Freq: Every day | ORAL | 0 refills | Status: DC

## 2019-03-24 MED ORDER — ATORVASTATIN CALCIUM 20 MG PO TABS: 40.0000 mg | ORAL_TABLET | Freq: Every day | ORAL | Status: DC

## 2019-03-24 MED ORDER — VITAMIN D3 25 MCG PO TABS: 1000.0000 [IU] | ORAL_TABLET | Freq: Every day | ORAL | Status: DC

## 2019-03-24 MED ORDER — DILTIAZEM HCL ER COATED BEADS 180 MG PO CP24: 180.0000 mg | ORAL_CAPSULE | Freq: Every day | ORAL | Status: DC

## 2019-03-24 MED ORDER — ASCORBIC ACID 250 MG PO TABS: 250.0000 mg | ORAL_TABLET | Freq: Two times a day (BID) | ORAL | Status: AC

## 2019-03-24 NOTE — Progress Notes (Signed)
PT Cancellation Note  Patient Details Name: Donald [name]. MRN: [id number] DOB: 1961/06/20   Cancelled Treatment:    Reason Eval/Treat Not Completed: Patient declined, no reason specified Pt is having his breakfast, unsure if he is leaving today but did not feel like PT this AM.  States he may be willing to work later if he is not discharging.  Kreg Shropshire, DPT 03/24/2019, 11:21 AM

## 2019-03-24 NOTE — TOC Transition Note (Signed)
Transition of Care [facility]) - CM/SW Discharge Note   Patient Details  Name: Donald [name]. MRN: [id number] Date of Birth: [birth year]  Transition of [facility]) CM/SW Contact:  Katrina Stack, RN Phone Number: 03/24/2019, 3:37 PM   Clinical Narrative:   Much discussion this morning regarding competency and  per attending, patient is not capable of making informed decisions regarding his care. Discussed need to document this in the medical record.  Spoke with patient's mother [name] and informed of discharge to [facility] today. She discussed this with the patient over the phone and he is in agreement. Spoke with Doug from the facility and confirmed that all clinical information required hs been received. CM reported that [facility] team members state there have been no behavior concerns observed.  Negative Covid test today. Patient will discharge with foley to keep pressure ulcers areas dry    Final next level of care: Skilled Nursing Facility Barriers to Discharge: No Barriers Identified   Patient Goals and CMS Choice Patient states their goals for this hospitalization and ongoing recovery are:: Go Home CMS Medicare.gov Compare Post Acute Care list provided to:: Other (Comment Required)(mother) Choice offered to / list presented to : Parent  Discharge Placement              Patient chooses bed at: [facility]) Patient to be transferred to facility by: EMS Name of family member notified: Patient and Mother Patient and family notified of of transfer: 03/24/19  Discharge Plan and Services In-house Referral: Clinical Social Work   Post Acute Care Choice: [place]                               Social Determinants of Health ([place]) Interventions     Readmission Risk Interventions Readmission Risk Prevention Plan 03/14/2019  Transportation Screening Complete  Home Care Screening Not Complete  Home Care Screening Not Completed  Comments Pt needs SNF placement  Medication Review (RN CM) Complete  Some recent data might be hidden

## 2019-03-24 NOTE — Progress Notes (Addendum)
attempted to call discharge report called to [facility]/1 st attempt phone answered and RN put on hold 20 min/ multiple  Attempts made after- phone ringing busy/ social worker and case mag made aware/ they also attempted to contact [facility] with no success/    midline IV and tele removed/ MD orders for pt to be discharged with foley in place to protect bilateral groin sites and sacrum pressure ulcer/ EMS called to transport.

## 2019-03-24 NOTE — Progress Notes (Addendum)
Palliative: Donald [name], Donald [name], is resting quietly in bed. He greets me making and mostly keeping eye contact.  He continues to appear frail and weak.  He continues to be oriented to self only, unable to make decisions.  There is no family at bedside at this time due to visitor restrictions.   He 3rd great toe remains purple, but is not markedly worsened. He has a good appetite, and is able to make his needs know.   Conference with bedside nursing staff, attending, and RN CM.   Plan: Rehab, likely long term care. Outpatient palliative to follow. Follow up with Podiatry.   50 minutes Donald Axe, NP Palliative Medicine Team Team Phone # [phone] Greater than 50% of this time was spent counseling and coordinating care related to the above assessment and plan.

## 2019-03-24 NOTE — Progress Notes (Addendum)
Subjective: Patient much improved today.  Awake and alert.    Objective: Current vital signs: BP 107/73 (BP Location: Right Arm)   Pulse (!) 117   Temp 98.2 F (36.8 C) (Oral)   Resp 14   Ht 5\' 9"  (1.753 m)   Wt 49.2 kg   SpO2 100%   BMI 16.02 kg/m  Vital signs in last 24 hours: Temp:  [97.8 F (36.6 C)-98.5 F (36.9 C)] 98.2 F (36.8 C) (07/17 0716) Pulse Rate:  [96-117] 117 (07/17 0716) Resp:  [14-20] 14 (07/17 0541) BP: (107-124)/(67-73) 107/73 (07/17 0716) SpO2:  [100 %] 100 % (07/17 0716)  Intake/Output from previous day: 07/16 0701 - 07/17 0700 In: 3569.9 [I.V.:3569.9] Out: 700 [Urine:700] Intake/Output this shift: No intake/output data recorded. Nutritional status:  Diet Order            DIET - DYS 1 Room service appropriate? Yes with Assist; Fluid consistency: Nectar Thick  Diet effective now              Neurologic Exam: Mental Status: Alert, oriented to name and that in the hospital.  Reports that it is 2019.  Speech fluent without evidence of aphasia.  Dysarhtric.  Able to follow commands without difficulty. Cranial Nerves: II: Visual fields grossly normal, pupils equal, round, reactive to light and accommodation III,IV, VI: ptosis not present, extra-ocular motions intact bilaterally V,VII: smile symmetric VIII: hearing normal bilaterally IX,X: gag reflex present XI: bilateral shoulder shrug XII: midline tongue extension Motor: 5/5 in the BUE's.  Seated with legs flexed but able to move both lower extremities.   Lab Results: Basic Metabolic Panel: Recent Labs  Lab 03/18/19 0330  03/20/19 0459 03/21/19 0929 03/22/19 0600 03/23/19 0357 03/24/19 0842  NA 146*  --  147*  --  143 140 137  K 3.9  --  4.7  --  3.9 4.0 3.5  CL 120*  --  121*  --  116* 113* 111  CO2 19*  --  19*  --  19* 20* 18*  GLUCOSE 89  --  101*  --  102* 102* 93  BUN 23*  --  22*  --  18 17 14   CREATININE 0.89   < > 0.97 0.94 0.96 0.95 0.89  CALCIUM 8.7*  --  8.8*  --   8.4* 8.5* 8.5*  MG  --   --   --   --   --   --  1.7  PHOS  --   --   --   --   --   --  3.5   < > = values in this interval not displayed.    Liver Function Tests: Recent Labs  Lab 03/22/19 0600 03/23/19 0357 03/24/19 0842  AST 57* 62* 46*  ALT 126* 116* 96*  ALKPHOS 73 64 62  BILITOT 0.8 0.9 0.8  PROT 6.3* 5.8* 5.8*  ALBUMIN 2.3* 2.1* 2.3*   No results for input(s): LIPASE, AMYLASE in the last 168 hours. Recent Labs  Lab 03/22/19 0600  AMMONIA <9*    CBC: Recent Labs  Lab 03/20/19 0459 03/21/19 0929 03/22/19 0600 03/23/19 0357 03/24/19 0842  WBC 19.4* 17.2* 16.5* 16.9* 15.2*  HGB 9.0* 9.1* 8.3* 8.0* 7.9*  HCT 27.8* 28.2* 25.7* 25.1* 23.6*  MCV 106.1* 106.0* 105.3* 105.9* 103.5*  PLT 289 372 391 419* 485*    Cardiac Enzymes: No results for input(s): CKTOTAL, CKMB, CKMBINDEX, TROPONINI in the last 168 hours.  Lipid Panel: Recent Labs  Lab  03/24/19 0842  CHOL 113  TRIG 140  HDL 25*  CHOLHDL 4.5  VLDL 28  LDLCALC 60    CBG: No results for input(s): GLUCAP in the last 168 hours.  Microbiology: Results for orders placed or performed during the hospital encounter of 03/13/19  Urine culture     Status: Abnormal   Collection Time: 03/13/19  9:56 AM   Specimen: Urine, Clean Catch  Result Value Ref Range Status   Specimen Description   Final    URINE, CLEAN CATCH Performed at [facility], [address] [place], [place] [address]    Special Requests   Final    NONE Performed at [facility], [place], [place] [address]    Culture >=100,000 COLONIES/mL STAPHYLOCOCCUS EPIDERMIDIS (A)  Final   Report Status 03/15/2019 FINAL  Final   Organism ID, Bacteria STAPHYLOCOCCUS EPIDERMIDIS (A)  Final      Susceptibility   Staphylococcus epidermidis - MIC*    CIPROFLOXACIN <=0.5 SENSITIVE Sensitive     GENTAMICIN <=0.5 SENSITIVE Sensitive     NITROFURANTOIN <=16 SENSITIVE Sensitive     OXACILLIN <=0.25 SENSITIVE Sensitive      TETRACYCLINE >=16 RESISTANT Resistant     VANCOMYCIN 2 SENSITIVE Sensitive     TRIMETH/SULFA <=10 SENSITIVE Sensitive     CLINDAMYCIN <=0.25 SENSITIVE Sensitive     RIFAMPIN <=0.5 SENSITIVE Sensitive     Inducible Clindamycin NEGATIVE Sensitive     * >=100,000 COLONIES/mL STAPHYLOCOCCUS EPIDERMIDIS  SARS Coronavirus 2 (CEPHEID - Performed in [place] hospital lab), Hosp Order     Status: None   Collection Time: 03/13/19  9:56 AM   Specimen: Nasopharyngeal Swab  Result Value Ref Range Status   SARS Coronavirus 2 NEGATIVE NEGATIVE Final    Comment: (NOTE) If result is NEGATIVE SARS-CoV-2 target nucleic acids are NOT DETECTED. The SARS-CoV-2 RNA is generally detectable in upper and lower  respiratory specimens during the acute phase of infection. The lowest  concentration of SARS-CoV-2 viral copies this assay can detect is 250  copies / mL. A negative result does not preclude SARS-CoV-2 infection  and should not be used as the sole basis for treatment or other  patient management decisions.  A negative result may occur with  improper specimen collection / handling, submission of specimen other  than nasopharyngeal swab, presence of viral mutation(s) within the  areas targeted by this assay, and inadequate number of viral copies  (<250 copies / mL). A negative result must be combined with clinical  observations, patient history, and epidemiological information. If result is POSITIVE SARS-CoV-2 target nucleic acids are DETECTED. The SARS-CoV-2 RNA is generally detectable in upper and lower  respiratory specimens dur ing the acute phase of infection.  Positive  results are indicative of active infection with SARS-CoV-2.  Clinical  correlation with patient history and other diagnostic information is  necessary to determine patient infection status.  Positive results do  not rule out bacterial infection or co-infection with other viruses. If result is PRESUMPTIVE  POSTIVE SARS-CoV-2 nucleic acids MAY BE PRESENT.   A presumptive positive result was obtained on the submitted specimen  and confirmed on repeat testing.  While 2019 novel coronavirus  (SARS-CoV-2) nucleic acids may be present in the submitted sample  additional confirmatory testing may be necessary for epidemiological  and / or clinical management purposes  to differentiate between  SARS-CoV-2 and other Sarbecovirus currently known to infect humans.  If clinically indicated additional testing with an  alternate test  methodology [id number]) is advised. The SARS-CoV-2 RNA is generally  detectable in upper and lower respiratory sp ecimens during the acute  phase of infection. The expected result is Negative. Fact Sheet for Patients:  StrictlyIdeas.no Fact Sheet for Healthcare Providers: BankingDealers.co.za This test is not yet approved or cleared by the [place] FDA and has been authorized for detection and/or diagnosis of SARS-CoV-2 by FDA under an Emergency Use Authorization (EUA).  This EUA will remain in effect (meaning this test can be used) for the duration of the COVID-19 declaration under Section 564(b)(1) of the Act, 21 U.S.C. section 360bbb-3(b)(1), unless the authorization is terminated or revoked sooner. Performed at [facility], [address] [place], [place] [address]   Blood Culture (routine x 2)     Status: None   Collection Time: 03/13/19 10:39 AM   Specimen: BLOOD  Result Value Ref Range Status   Specimen Description BLOOD BLOOD RIGHT FOREARM  Final   Special Requests   Final    BOTTLES DRAWN AEROBIC AND ANAEROBIC Blood Culture results may not be optimal due to an excessive volume of blood received in culture bottles   Culture   Final    NO GROWTH 5 DAYS Performed at [facility], [address] [place], [place] [address]    Report Status 03/18/2019 FINAL  Final  Blood Culture (routine x  2)     Status: None   Collection Time: 03/13/19 10:39 AM   Specimen: BLOOD  Result Value Ref Range Status   Specimen Description BLOOD FOREARM  Final   Special Requests   Final    BOTTLES DRAWN AEROBIC AND ANAEROBIC Blood Culture adequate volume   Culture   Final    NO GROWTH 5 DAYS Performed at [facility], [address] [place], [place] [address]    Report Status 03/18/2019 FINAL  Final  MRSA PCR Screening     Status: Abnormal   Collection Time: 03/13/19  7:04 PM   Specimen: Nasal Mucosa; Nasopharyngeal  Result Value Ref Range Status   MRSA by PCR POSITIVE (A) NEGATIVE Final    Comment:        The GeneXpert MRSA Assay (FDA approved for NASAL specimens only), is one component of a comprehensive MRSA colonization surveillance program. It is not intended to diagnose MRSA infection nor to guide or monitor treatment for MRSA infections. RESULT CALLED TO, READ BACK BY AND VERIFIED WITH: VIVIAN ALI @2021  03/13/19 MJU Performed at [facility], [address] [place], [place] [address]   Aerobic Culture (superficial specimen)     Status: None   Collection Time: 03/16/19  9:52 PM   Specimen: Wound  Result Value Ref Range Status   Specimen Description   Final    WOUND Performed at [facility], [address], [place], [place] [address]    Special Requests   Final    NONE Performed at [facility], [place], [place] [address]    Gram Stain NO WBC SEEN NO ORGANISMS SEEN   Final   Culture   Final    NO GROWTH 2 DAYS Performed at [facility], [place] [address], [place], [place] [address]    Report Status 03/19/2019 FINAL  Final  Hsv Culture And Typing     Status: None   Collection Time: 03/16/19  9:52 PM   Specimen: Penile; Other  Result Value Ref Range Status   HSV Culture/Type Comment  Final  Comment: (NOTE) Negative No Herpes simplex virus isolated. Performed At: [facility] [place], [place] [id number] Rush Farmer MD XA:[id number]    Source of Sample PENIS  Final    Comment: Performed at [facility], [address] [place], [place] [address]    Coagulation Studies: No results for input(s): LABPROT, INR in the last 72 hours.  Imaging: Mr Brain Wo Contrast  Result Date: 03/23/2019 CLINICAL DATA:  Encephalopathy EXAM: MRI HEAD WITHOUT CONTRAST TECHNIQUE: Multiplanar, multiecho pulse sequences of the brain and surrounding structures were obtained without intravenous contrast. COMPARISON:  Head CT from 2 days ago FINDINGS: Brain: Subcentimeter acute infarct along the right parietal cortex. Diffusion hyperintense focus in the left posterior cerebral white matter on coronal diffusion imaging has no ADC correlate, suspect shine through. Chronic lacunes in the right thalamus, left corona radiata, bilateral pons, and left centrum semiovale. There is superimposed ischemic gliosis in the white matter that is confluent. Generalized atrophy. No evidence of acute hemorrhage, hydrocephalus, collection, or mass. Vascular: Grossly preserved major flow voids Skull and upper cervical spine: No gross bony lesion Sinuses/Orbits: Bilateral cataract resection Other: Significantly motion degraded study which was truncated. IMPRESSION: 1. Subcentimeter acute infarct in the low right parietal cortex. 2. Background of atrophy and chronic small vessel ischemia that is advanced-especially for age. 3. Motion degraded and truncated study. Electronically Signed   By: Monte Fantasia M.D.   On: 03/23/2019 08:55   US Carotid Bilateral  Result Date: 03/24/2019 CLINICAL DATA:  Acute stroke.  Hypertension, syncope. EXAM: BILATERAL CAROTID DUPLEX ULTRASOUND TECHNIQUE: Pearline Cables scale imaging, color Doppler and duplex ultrasound were performed of bilateral carotid and vertebral arteries in the neck. COMPARISON:  05/30/2011 FINDINGS: Criteria: Quantification of carotid stenosis is based on velocity  parameters that correlate the residual internal carotid diameter with NASCET-based stenosis levels, using the diameter of the distal internal carotid lumen as the denominator for stenosis measurement. The following velocity measurements were obtained: RIGHT ICA: 87/33 cm/sec CCA: 96/28 cm/sec SYSTOLIC ICA/CCA RATIO:  1.9 ECA: 61 cm/sec LEFT ICA: 90/34 cm/sec CCA: 36/62 cm/sec SYSTOLIC ICA/CCA RATIO:  1.3 ECA: 87 cm/sec RIGHT CAROTID ARTERY: Mild partially calcified plaque in distal common carotid artery, bulb, extending into proximal internal and external carotid arteries resulting in at least mild stenosis. Normal waveforms and color Doppler signal throughout however. RIGHT VERTEBRAL ARTERY:  Normal flow direction and waveform. LEFT CAROTID ARTERY: Eccentric partially calcified plaque in the bulb extending into proximal external and internal carotid arteries resulting in at least mild stenosis. Normal waveforms and color Doppler signal throughout however. LEFT VERTEBRAL ARTERY:  Normal flow direction and waveform. IMPRESSION: 1. Bilateral carotid bifurcation plaque resulting in less than 50% diameter ICA stenosis. 2. Antegrade bilateral vertebral arterial flow. Electronically Signed   By: Lucrezia Europe M.D.   On: 03/24/2019 07:36    Medications:  I have reviewed the patient's current medications. Scheduled: . amLODipine  10 mg Oral Daily  . apixaban  5 mg Oral BID  . aspirin EC  81 mg Oral Daily  . cholecalciferol  1,000 Units Oral Daily  . cloNIDine  0.1 mg Oral BID  . diltiazem  60 mg Oral Q8H  . feeding supplement (NEPRO CARB STEADY)  237 mL Oral BID BM  . folic acid  1 mg Oral Daily  . Gerhardt's butt cream   Topical TID  . lactulose  10 g Oral Once  . megestrol  200 mg Oral BID  . metoprolol succinate  100 mg Oral Daily  . metoprolol succinate  50 mg Oral Daily  . multivitamin with minerals  1 tablet Oral Daily  . polyethylene glycol  17 g Oral Daily  . senna  1 tablet Oral Daily  . sodium  chloride flush  10-40 mL Intracatheter Q12H  . tamsulosin  0.4 mg Oral QPC supper  . thiamine  100 mg Oral Daily  . vitamin C  250 mg Oral BID  . ziprasidone  10 mg Intramuscular Once    Assessment/Plan: Patient improved today.  Cooperative and more alert.  Thiamine level is 262. MRI reviewed and shows small, subcentimeter right parietal acute infarct.  Do not suspect this to be cause of mental status change.  Carotid dopplers show no evidence of hemodynamically significant stenosis.  Echocardiogram pending.  LDL 60.  Patient on Apixaban and ASA.  Therapy following patient.    No further neurologic intervention is recommended at this time.  If further questions arise, please call or page at that time.  Thank you for allowing neurology to participate in the care of this patient.   LOS: 11 days   Alexis Goodell, MD Neurology [phone] 03/24/2019  10:59 AM

## 2019-03-24 NOTE — Progress Notes (Signed)
[facility] Daily Progress Note   Subjective: 8 Days Post-Op 1. Left common femoral, profunda femoris, and superficial femoral artery endarterectomies 2. Right common femoral, profunda femoris, and superficial femoral artery endarterectomies 3. Catheter placement into aorta from bilateral femoral approach 4.Bilateral external iliac stent placements with 8 mm diameter by 10 cm length via bond stents 5.Bilateral common iliac stent placements with 8 mm diameter by 58 mm length lifestream stents 6.4 stent placements to the aorta to realign both iliac arteries up to the base of both renal arteries usingtwo9 mm diameter by 58 mm length lifestream stents and two 10 mm diameter by 58 mm length lifestream stents  Patient without complaint this AM. Seems to be resting comfortably in bed.   Objective: Vitals:   03/23/19 1514 03/23/19 1958 03/24/19 0541 03/24/19 0716  BP: 109/68 109/67 124/69 107/73  Pulse: (!) 102 96 (!) 103 (!) 117  Resp: 19 20 14    Temp: 97.8 F (36.6 C) 98.4 F (36.9 C) 98.5 F (36.9 C) 98.2 F (36.8 C)  TempSrc: Oral Oral Oral Oral  SpO2: 100% 100% 100% 100%  Weight:      Height:        Intake/Output Summary (Last 24 hours) at 03/24/2019 1059 Last data filed at 03/24/2019 8119 Gross per 24 hour  Intake 3559.85 ml  Output 700 ml  Net 2859.85 ml   Physical Exam: A&Ox2, NAD CV: RRR Pulmonary: CTA Bilaterally Abdomen: Soft, Nontender, Nondistended Right Groin:  Dressing clean and dry.              Femoral endarterectomy incision: Clean dry and intact.  No erythema. Left Groin: Dressing clean and dry.              Femoral endarterectomy incision: Clean dry and intact.  No erythema. Vascular:             Right Lower Extremity: Thigh soft.  Calf soft.  Extremity is distally warm to toes.  First / Third toe with some dark discoloration. Dorsal aspect of foot with reddish discoloration.             Left Lower Extremity: Thigh  soft.  Calf soft.  Extremity is distally warm to toes.   Laboratory: CBC    Component Value Date/Time   WBC 15.2 (H) 03/24/2019 0842   HGB 7.9 (L) 03/24/2019 0842   HGB 14.9 07/31/2015 1117   HCT 23.6 (L) 03/24/2019 0842   HCT 42.5 07/31/2015 1117   PLT 485 (H) 03/24/2019 0842   PLT 161 07/31/2015 1117   BMET    Component Value Date/Time   NA 137 03/24/2019 0842   NA 125 (L) 02/07/2016 1305   NA 130 (L) 11/20/2011 0510   K 3.5 03/24/2019 0842   K 4.0 11/20/2011 0510   CL 111 03/24/2019 0842   CL 98 11/20/2011 0510   CO2 18 (L) 03/24/2019 0842   CO2 18 (L) 11/20/2011 0510   GLUCOSE 93 03/24/2019 0842   GLUCOSE 82 11/20/2011 0510   BUN 14 03/24/2019 0842   BUN 5 (L) 02/07/2016 1305   BUN 1 (L) 11/20/2011 0510   CREATININE 0.89 03/24/2019 0842   CREATININE 0.93 01/05/2019 0800   CALCIUM 8.5 (L) 03/24/2019 0842   CALCIUM 8.5 11/20/2011 0510   GFRNONAA >60 03/24/2019 0842   GFRNONAA 92 01/05/2019 0800   GFRAA >60 03/24/2019 0842   GFRAA 107 01/05/2019 0800   Assessment/Planning: The patient is a 58 year old male with multiple medical  issues including peripheral artery disease status post bilateral femoral endarterectomy with multiple stent placements postop day #8 1) bilateral femoral endarterectomy incisions are still clean dry and intact.  Continue cleaning the incision/groin daily and placing a dry Kerlix as the patient continues to lay in a fetal position. 2) patient will most likely need amputation to the right foot in the future.  As per podiatry will need to wait for the foot to demarcate.  The patient's extremities are warm distally to the toes.  Discussed with Dr. Ellis Parents Sayan Aldava PA-C 03/24/2019 10:59 AM

## 2019-03-24 NOTE — Discharge Summary (Addendum)
[facility] NAME: Donald [name]    MR#:  [id number]  DATE OF BIRTH:  1961/04/24  DATE OF ADMISSION:  03/13/2019 ADMITTING PHYSICIAN: Lang Snow, NP  DATE OF DISCHARGE:  03/24/19   PRIMARY CARE PHYSICIAN: Olin Hauser, DO    ADMISSION DIAGNOSIS:  Dehydration [E86.0] Healthcare-associated pneumonia [J18.9] Acute cystitis without hematuria [N30.00] Sepsis, due to unspecified organism, unspecified whether acute organ dysfunction present ([place]) [A41.9] Sepsis due to urinary tract infection ([place]) [A41.9, N39.0]  DISCHARGE DIAGNOSIS:  Active Problems:   Sepsis ([place])   Goals of care, counseling/discussion   Palliative care by specialist   DNR (do not resuscitate) discussion   Healthcare-associated pneumonia   Acute CVA (cerebrovascular accident) ([place])   SECONDARY DIAGNOSIS:   Past Medical History:  Diagnosis Date   Depression    Emphysema of lung ([place])    Essential hypertension    History of chicken pox    Low weight    Neuropathy    bilateral lower legs   Peripheral vascular disease ([place])    Prostate disease    Seasonal allergies    Seizures ([place])    Wears dentures    full upper    HOSPITAL COURSE:  HPI  58 y.o. male with pertinent past medical history of hypertension, BPH, former tobacco abuse, alcohol abuse, peripheral neuropathy, PVD, anemia, sensory ataxia, colon cancer, depression, hypertension, and seizure presenting to the ED with altered mental status.  Patient is not a good historian so history mostly obtained from patient's chart.  Per PCP notes, patient's mother called with concerns about the patient being disoriented.  She stated patient has had poor p.o. intake since last admission for delirium tremens with worsening over the past 3 days.  She reported patient is currently only taking a few sips of water and small amount of food.  His mother also states that he has been  up all night hallucinating, and talking to himself.  Patient normally uses crutches to ambulate and lives on his own however since he was discharged he has been living with the mom due to bilateral lower extremity pain with discoloration of his toes.  She is also noted urinary incontinence and foul urine odor.  She was concerned that patient is declining and may need to go back to the hospital for further evaluation.  On arrival to the ED, he was afebrile with blood pressure 174/107 mm Hg and pulse rate 162 beats/min. There were no focal neurological deficits; he was alert and oriented x 3.  Initial labs revealed elevated WBC 21.5, platelets 478,K+ 3.1, AST 86, total bilirubin 1.8, lactic acid 2.3, UDS positive for benzo, urinalysis with positive nitrates, leukocytes and elevated WBC.  ECG showed sinus rhythm of 51 beats per minute and the chest X-ray showed hyperinflation and bronchitic changes, atelectasis versus infiltrate in the medial left lung base. A non-contrast head CT showed no acute intracranial abnormality.  Patient will be admitted to hospitalist service for further management.   #.  Acute deliriumurosepsis, with history of severe alcohol abuse  sepsis improved-slow clinical improvement  Leukocytosis:pt remains afebrile but still altered, could be his new baseline.  Not agitated - ID following, appreciate recommednations and patient restarted on cefepime and vanc - Leukocytosis believed to be 2/2 PAD and ischemia, improved after procedure; WBCs (26.4-23.2-17.2-16.5--15 -CXR with no signs of acute process, procalcitonin 0.53>0.34  -Blood cultures no growth, aerobic cultures with no growth - Urine culturewithstaphepidermidis, which  is likely contaminate per ID - MRSA PCRpositive - IVFs and PRN bolus given to keep MAP >60 per sepsis protocol - palliative carefollowing,patient now DNR -B12 and folate levels are normal.  B1 level is 260.  Ammonia level normal  -Completed 7 days  of antibiotic course and antibiotics discontinued.  ID signed off  - #Acute CVA Repeat CT head is negative but MRI of the brain has revealed a new subcentimeter acute infarct in the low right parietal cortex Seen by neurology -EEG significant for slowing,  repeat brain imaging-CT scan no acute findings Continue neurochecks, follow-up with neurology, carotid Dopplers and 2D echocardiogram-55 to 60% ejection fraction no pericardial effusion Seen by speech therapy on 03/22/2019  #Tachycardia -IV fluid bolus given and continue IV fluids  -Monitor patient telemetry - EKG-with sinus tachycardia seen by cardiology -Dr. Clayborn Bigness appreciate their recommendations -IV Lopressor as needed.  Toprol dose increased  we will continue close monitoring.  Tachycardia improved  3.PAD with ischemic feet BL s/p PCI 03/13/19, for revascularization on 03/16/2019 - Vascular surgery-Left common femoral, profunda femoris, and superficial femoral artery endarterectomies Right common femoral, profunda femoris, and superficial femoral artery endarterectomies Catheter placement into aorta from bilateral femoral approach Bilateral external iliac stent placements with 8 mm diameter by 10 cm length via bond stents Bilateral common iliac stent placements with 8 mm diameter by 58 mm length lifestream stents femoral endarterectomy/extensive aortoiliac stenting  -Heparin drip discontinued and changed to Eliquis.  Plavix discontinued.  Continue baby aspirin as per my discussion with vascular surgery -Wound vacs from the the groin removed   4. ETOH abuse - Recent hospitalizationforDTs -Noactivesigns of withdrawal, CIWA monitoring - Checkdaily BMP+Mg- wnl 7/9 - Daily Folate, MVI;continued high-dose IV thiamine x3 days - PT/OT evaluation for mobility- SNF  5. Penile/ sacral (stage II)/ scrotal decubiti ulcerss/p revascularization  - woundcare  -Foley catheter in place to help keep the area dry -  Likely due to the condom cath use in the previous hospitalization -   6. Hypokalemia  Hypomagnesemia -Monitor K and mag, repleteas indicated  7.HTN-Goal BP <140/90 -OnClonidine, metoprolol and amlodipine - IV labetalol PRN  8.Severe, protein calorie malnutrition- likely 2/2 ETOH use disorder - nutritionfollowing, supplements -Speech therapy recommending modified dysphagia diet with a dysphagia level 1 pured with nectar liquids.  Frequent oral care and assistance with meals  9.  Right foot third toe discoloration-podiatry consult placed.  Most likely will progress to gangrene per podiatry.  Awaiting for clear demarcation which will take approximately 1 to 2 weeks no other interventions are recommended at this point from podiatry or vascular standpoint.  Podiatry signed off Heparin drip changed to Eliquis  Disposition skilled nursing facility with 24-hour supervision.  Family is agreeable for rehab at [facility].  Continue outpatient palliative care     DISCHARGE CONDITIONS:   sta ble  CONSULTS OBTAINED:  Treatment Team:  Algernon Huxley, MD Yolonda Kida, MD Samara Deist, DPM Catarina Hartshorn, MD   PROCEDURES    DRUG ALLERGIES:  No Known Allergies  DISCHARGE MEDICATIONS:   Allergies as of 03/24/2019   No Known Allergies     Medication List    STOP taking these medications   amLODipine 10 MG tablet Commonly known as: NORVASC   aspirin 81 MG tablet Replaced by: aspirin 81 MG EC tablet   chlordiazePOXIDE 25 MG capsule Commonly known as: LIBRIUM     TAKE these medications   acetaminophen 500 MG tablet Commonly known as: TYLENOL Take  500 mg by mouth every 6 (six) hours as needed.   albuterol 108 (90 Base) MCG/ACT inhaler Commonly known as: VENTOLIN HFA Inhale 2 puffs into the lungs every 6 (six) hours as needed for wheezing or shortness of breath.   apixaban 5 MG Tabs tablet Commonly known as: ELIQUIS Take 1 tablet (5  mg total) by mouth 2 (two) times daily.   ascorbic acid 250 MG tablet Commonly known as: VITAMIN C Take 1 tablet (250 mg total) by mouth 2 (two) times daily.   aspirin 81 MG EC tablet Take 1 tablet (81 mg total) by mouth daily. Start taking on: March 25, 2019 Replaces: aspirin 81 MG tablet   atorvastatin 40 MG tablet Commonly known as: LIPITOR Take 1 tablet (40 mg total) by mouth daily at 6 PM.   cloNIDine 0.1 MG tablet Commonly known as: CATAPRES Take 1 tablet (0.1 mg total) by mouth 2 (two) times daily.   diltiazem 240 MG 24 hr capsule Commonly known as: CARDIZEM CD Take 1 capsule (240 mg total) by mouth daily. Start taking on: March 25, 2019   feeding supplement (NEPRO CARB STEADY) Liqd Take 237 mLs by mouth 2 (two) times daily between meals.   Fluticasone-Salmeterol 250-50 MCG/DOSE Aepb Commonly known as: ADVAIR Inhale 1 puff into the lungs 2 (two) times daily.   folic acid 1 MG tablet Commonly known as: FOLVITE Take 1 tablet (1 mg total) by mouth daily. Start taking on: March 25, 2019   Gerhardt's butt cream Crea Apply 1 application topically 3 (three) times daily.   megestrol 40 MG tablet Commonly known as: MEGACE Take 2-4 tablets (80-160 mg total) by mouth daily. Start with 2 tablets a day, then gradually increase as tolerated up to 4 per day   metoprolol succinate 50 MG 24 hr tablet Commonly known as: TOPROL-XL Take 2 tablets (100 mg total) by mouth daily. What changed: how much to take   multivitamin with minerals Tabs tablet Take 1 tablet by mouth daily. Start taking on: March 25, 2019   polyethylene glycol 17 g packet Commonly known as: MIRALAX / GLYCOLAX Take 17 g by mouth daily. Start taking on: March 25, 2019   tamsulosin 0.4 MG Caps capsule Commonly known as: FLOMAX Take 1 capsule (0.4 mg total) by mouth daily after supper.   thiamine 100 MG tablet Take 1 tablet (100 mg total) by mouth daily. Start taking on: March 25, 2019   Vitamin D3 25 MCG  tablet Commonly known as: Vitamin D Take 1 tablet (1,000 Units total) by mouth daily.        DISCHARGE INSTRUCTIONS:   Follow-up with primary care physician in 2 to 3 days Follow-up with cardiology Dr. Clayborn Bigness in 1 week Follow-up with vascular surgery Dr. Rutherford Guys 1 week Follow-up with podiatry Dr. Cleda Mccreedy  in 2 weeks Continue Foley catheter Continue wound care Follow-up with neurology in 1 month  DIET:  Dysphagia 1 diet with nectar thick liquids nutrition supplements  DISCHARGE CONDITION:  Fair  ACTIVITY:  Activity as tolerated  OXYGEN:  Home Oxygen: No.   Oxygen Delivery: room air  DISCHARGE LOCATION:  nursing home   If you experience worsening of your admission symptoms, develop shortness of breath, life threatening emergency, suicidal or homicidal thoughts you must seek medical attention immediately by calling 911 or calling your MD immediately  if symptoms less severe.  You Must read complete instructions/literature along with all the possible adverse reactions/side effects for all the Medicines you take and that  have been prescribed to you. Take any new Medicines after you have completely understood and accpet all the possible adverse reactions/side effects.   Please note  You were cared for by a hospitalist during your hospital stay. If you have any questions about your discharge medications or the care you received while you were in the hospital after you are discharged, you can call the unit and asked to speak with the hospitalist on call if the hospitalist that took care of you is not available. Once you are discharged, your primary care physician will handle any further medical issues. Please note that NO REFILLS for any discharge medications will be authorized once you are discharged, as it is imperative that you return to your primary care physician (or establish a relationship with a primary care physician if you do not have one) for your aftercare needs so that  they can reassess your need for medications and monitor your lab values.     Today  Chief Complaint  Patient presents with   Altered Mental Status    Patient is feeling okay oriented x1-2, pleasant confusion ROS: Limited CONSTITUTIONAL:  weakness.  RESPIRATORY: Denies cough, wheeze, shortness of breath.  CARDIOVASCULAR: Denies chest pain GASTROINTESTINAL: Denies nausea, vomiting, diarrhea, abdominal pain   VITAL SIGNS:  Blood pressure 107/73, pulse (!) 117, temperature 98.2 F (36.8 C), temperature source Oral, resp. rate 14, height 5\' 9"  (1.753 m), weight 49.2 kg, SpO2 100 %.  I/O:    Intake/Output Summary (Last 24 hours) at 03/24/2019 1457 Last data filed at 03/24/2019 0653 Gross per 24 hour  Intake 3559.85 ml  Output 700 ml  Net 2859.85 ml    PHYSICAL EXAMINATION:  GENERAL:  58 y.o.-year-old patient lying in the bed with no acute distress.  EYES: Pupils equal, round, reactive to light and accommodation. No scleral icterus. Extraocular muscles intact.  HEENT: Head atraumatic, normocephalic. Oropharynx and nasopharynx clear.  NECK:  Supple, no jugular venous distention. No thyroid enlargement, no tenderness.  LUNGS: Normal breath sounds bilaterally, no wheezing, rales,rhonchi or crepitation. No use of accessory muscles of respiration.  CARDIOVASCULAR: S1, S2 normal. No murmurs, rubs, or gallops.  ABDOMEN: Soft, non-tender, non-distended. Bowel sounds present. EXTREMITIES: No pedal edema, cyanosis, or clubbing.  NEUROLOGIC: Awake and alert and oriented x1  pSYCHIATRIC: The patient is alert and oriented x 1  SKIN: Multiple wounds, bilateral groin wounds, third toe discoloration        DATA REVIEW:   CBC Recent Labs  Lab 03/24/19 0842  WBC 15.2*  HGB 7.9*  HCT 23.6*  PLT 485*    Chemistries  Recent Labs  Lab 03/24/19 0842  NA 137  K 3.5  CL 111  CO2 18*  GLUCOSE 93  BUN 14  CREATININE 0.89  CALCIUM 8.5*  MG 1.7  AST 46*  ALT 96*  ALKPHOS 62   BILITOT 0.8    Cardiac Enzymes No results for input(s): TROPONINI in the last 168 hours.  Microbiology Results  Results for orders placed or performed during the hospital encounter of 03/13/19  Urine culture     Status: Abnormal   Collection Time: 03/13/19  9:56 AM   Specimen: Urine, Clean Catch  Result Value Ref Range Status   Specimen Description   Final    URINE, CLEAN CATCH Performed at [facility], [address], [place], [place] [address]    Special Requests   Final    NONE Performed at [facility], [place],  [place] [address]    Culture >=100,000 COLONIES/mL STAPHYLOCOCCUS EPIDERMIDIS (A)  Final   Report Status 03/15/2019 FINAL  Final   Organism ID, Bacteria STAPHYLOCOCCUS EPIDERMIDIS (A)  Final      Susceptibility   Staphylococcus epidermidis - MIC*    CIPROFLOXACIN <=0.5 SENSITIVE Sensitive     GENTAMICIN <=0.5 SENSITIVE Sensitive     NITROFURANTOIN <=16 SENSITIVE Sensitive     OXACILLIN <=0.25 SENSITIVE Sensitive     TETRACYCLINE >=16 RESISTANT Resistant     VANCOMYCIN 2 SENSITIVE Sensitive     TRIMETH/SULFA <=10 SENSITIVE Sensitive     CLINDAMYCIN <=0.25 SENSITIVE Sensitive     RIFAMPIN <=0.5 SENSITIVE Sensitive     Inducible Clindamycin NEGATIVE Sensitive     * >=100,000 COLONIES/mL STAPHYLOCOCCUS EPIDERMIDIS  SARS Coronavirus 2 (CEPHEID - Performed in [facility] hospital lab), Hosp Order     Status: None   Collection Time: 03/13/19  9:56 AM   Specimen: Nasopharyngeal Swab  Result Value Ref Range Status   SARS Coronavirus 2 NEGATIVE NEGATIVE Final    Comment: (NOTE) If result is NEGATIVE SARS-CoV-2 target nucleic acids are NOT DETECTED. The SARS-CoV-2 RNA is generally detectable in upper and lower  respiratory specimens during the acute phase of infection. The lowest  concentration of SARS-CoV-2 viral copies this assay can detect is 250  copies / mL. A negative result does not preclude SARS-CoV-2 infection  and  should not be used as the sole basis for treatment or other  patient management decisions.  A negative result may occur with  improper specimen collection / handling, submission of specimen other  than nasopharyngeal swab, presence of viral mutation(s) within the  areas targeted by this assay, and inadequate number of viral copies  (<250 copies / mL). A negative result must be combined with clinical  observations, patient history, and epidemiological information. If result is POSITIVE SARS-CoV-2 target nucleic acids are DETECTED. The SARS-CoV-2 RNA is generally detectable in upper and lower  respiratory specimens dur ing the acute phase of infection.  Positive  results are indicative of active infection with SARS-CoV-2.  Clinical  correlation with patient history and other diagnostic information is  necessary to determine patient infection status.  Positive results do  not rule out bacterial infection or co-infection with other viruses. If result is PRESUMPTIVE POSTIVE SARS-CoV-2 nucleic acids MAY BE PRESENT.   A presumptive positive result was obtained on the submitted specimen  and confirmed on repeat testing.  While 2019 novel coronavirus  (SARS-CoV-2) nucleic acids may be present in the submitted sample  additional confirmatory testing may be necessary for epidemiological  and / or clinical management purposes  to differentiate between  SARS-CoV-2 and other Sarbecovirus currently known to infect humans.  If clinically indicated additional testing with an alternate test  methodology [phone]) is advised. The SARS-CoV-2 RNA is generally  detectable in upper and lower respiratory sp ecimens during the acute  phase of infection. The expected result is Negative. Fact Sheet for Patients:  StrictlyIdeas.no Fact Sheet for Healthcare Providers: BankingDealers.co.za This test is not yet approved or cleared by the [place] FDA and has been  authorized for detection and/or diagnosis of SARS-CoV-2 by FDA under an Emergency Use Authorization (EUA).  This EUA will remain in effect (meaning this test can be used) for the duration of the COVID-19 declaration under Section 564(b)(1) of the Act, 21 U.S.C. section 360bbb-3(b)(1), unless the authorization is terminated or revoked sooner. Performed at [facility], [address] [place],  [place] [address]   Blood Culture (routine x 2)     Status: None   Collection Time: 03/13/19 10:39 AM   Specimen: BLOOD  Result Value Ref Range Status   Specimen Description BLOOD BLOOD RIGHT FOREARM  Final   Special Requests   Final    BOTTLES DRAWN AEROBIC AND ANAEROBIC Blood Culture results may not be optimal due to an excessive volume of blood received in culture bottles   Culture   Final    NO GROWTH 5 DAYS Performed at [facility], [address] [place], [place] [address]    Report Status 03/18/2019 FINAL  Final  Blood Culture (routine x 2)     Status: None   Collection Time: 03/13/19 10:39 AM   Specimen: BLOOD  Result Value Ref Range Status   Specimen Description BLOOD FOREARM  Final   Special Requests   Final    BOTTLES DRAWN AEROBIC AND ANAEROBIC Blood Culture adequate volume   Culture   Final    NO GROWTH 5 DAYS Performed at [facility], [address] [place], [place] [address]    Report Status 03/18/2019 FINAL  Final  MRSA PCR Screening     Status: Abnormal   Collection Time: 03/13/19  7:04 PM   Specimen: Nasal Mucosa; Nasopharyngeal  Result Value Ref Range Status   MRSA by PCR POSITIVE (A) NEGATIVE Final    Comment:        The GeneXpert MRSA Assay (FDA approved for NASAL specimens only), is one component of a comprehensive MRSA colonization surveillance program. It is not intended to diagnose MRSA infection nor to guide or monitor treatment for MRSA infections. RESULT CALLED TO, READ BACK BY AND VERIFIED WITH: VIVIAN ALI @2021   03/13/19 MJU Performed at [facility], [address] [place], [place] [address]   Aerobic Culture (superficial specimen)     Status: None   Collection Time: 03/16/19  9:52 PM   Specimen: Wound  Result Value Ref Range Status   Specimen Description   Final    WOUND Performed at [facility], [address] [place], [place] [address]    Special Requests   Final    NONE Performed at [facility], [place], [place] [address]    Gram Stain NO WBC SEEN NO ORGANISMS SEEN   Final   Culture   Final    NO GROWTH 2 DAYS Performed at [facility], [place] [address] [place], [place] [address]    Report Status 03/19/2019 FINAL  Final  Hsv Culture And Typing     Status: None   Collection Time: 03/16/19  9:52 PM   Specimen: Penile; Other  Result Value Ref Range Status   HSV Culture/Type Comment  Final    Comment: (NOTE) Negative No Herpes simplex virus isolated. Performed At: [facility] [place], [place] [id number] Rush Farmer MD WC:[id number]    Source of Sample PENIS  Final    Comment: Performed at [facility], [address] [place], [place] [address]  SARS Coronavirus 2 ([place] - Performed in [facility] hospital lab), Hosp Order     Status: None   Collection Time: 03/24/19 11:20 AM   Specimen: Nasopharyngeal Swab  Result Value Ref Range Status   SARS Coronavirus 2 NEGATIVE NEGATIVE Final    Comment: (NOTE) If result is NEGATIVE SARS-CoV-2 target nucleic acids are NOT DETECTED. The SARS-CoV-2 RNA is generally detectable in upper and lower  respiratory specimens during the acute phase of infection. The lowest  concentration of SARS-CoV-2 viral copies this assay can detect is 250  copies / mL. A negative result does not preclude SARS-CoV-2 infection  and should not be used as the sole basis for treatment or other  patient management decisions.  A negative result may occur with  improper  specimen collection / handling, submission of specimen other  than nasopharyngeal swab, presence of viral mutation(s) within the  areas targeted by this assay, and inadequate number of viral copies  (<250 copies / mL). A negative result must be combined with clinical  observations, patient history, and epidemiological information. If result is POSITIVE SARS-CoV-2 target nucleic acids are DETECTED. The SARS-CoV-2 RNA is generally detectable in upper and lower  respiratory specimens dur ing the acute phase of infection.  Positive  results are indicative of active infection with SARS-CoV-2.  Clinical  correlation with patient history and other diagnostic information is  necessary to determine patient infection status.  Positive results do  not rule out bacterial infection or co-infection with other viruses. If result is PRESUMPTIVE POSTIVE SARS-CoV-2 nucleic acids MAY BE PRESENT.   A presumptive positive result was obtained on the submitted specimen  and confirmed on repeat testing.  While 2019 novel coronavirus  (SARS-CoV-2) nucleic acids may be present in the submitted sample  additional confirmatory testing may be necessary for epidemiological  and / or clinical management purposes  to differentiate between  SARS-CoV-2 and other Sarbecovirus currently known to infect humans.  If clinically indicated additional testing with an alternate test  methodology [phone]) is advised. The SARS-CoV-2 RNA is generally  detectable in upper and lower respiratory sp ecimens during the acute  phase of infection. The expected result is Negative. Fact Sheet for Patients:  StrictlyIdeas.no Fact Sheet for Healthcare Providers: BankingDealers.co.za This test is not yet approved or cleared by the [place] FDA and has been authorized for detection and/or diagnosis of SARS-CoV-2 by FDA under an Emergency Use Authorization (EUA).  This EUA will remain in  effect (meaning this test can be used) for the duration of the COVID-19 declaration under Section 564(b)(1) of the Act, 21 U.S.C. section 360bbb-3(b)(1), unless the authorization is terminated or revoked sooner. Performed at [facility], [place], [place] [address]     RADIOLOGY:  Ct Head Wo Contrast  Result Date: 03/21/2019 CLINICAL DATA:  58 y/o M; acute delirium in urosepsis, sepsis improved, persistent altered mental status. EXAM: CT HEAD WITHOUT CONTRAST TECHNIQUE: Contiguous axial images were obtained from the base of the skull through the vertex without intravenous contrast. COMPARISON:  03/13/2019 CT head FINDINGS: Brain: No evidence of acute infarction, hemorrhage, hydrocephalus, extra-axial collection or mass lesion/mass effect. 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 Stable chronic microvascular ischemic changes of white matter and volume loss of the brain. Vascular: Calcific atherosclerosis of internal carotid arteries and the vertebral arteries. Skull: Normal. Negative for fracture or focal lesion. Sinuses/Orbits: No acute finding. Other: Bilateral intra-ocular lens replacement. IMPRESSION: 1. No acute intracranial abnormality identified. 2. Stable chronic microvascular ischemic changes and volume loss of the brain. 3. 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 Electronically Signed   By: Kristine Garbe M.D.   On: 03/21/2019 19:19   Mr Brain Wo Contrast  Result Date: 03/23/2019 CLINICAL DATA:  Encephalopathy EXAM: MRI HEAD WITHOUT CONTRAST TECHNIQUE: Multiplanar, multiecho pulse sequences of the brain and surrounding structures were  obtained without intravenous contrast. COMPARISON:  Head CT from 2 days ago FINDINGS: Brain: Subcentimeter acute infarct along the right parietal cortex. Diffusion hyperintense focus in the left posterior cerebral white matter on coronal diffusion imaging has no  ADC correlate, suspect shine through. Chronic lacunes in the right thalamus, left corona radiata, bilateral pons, and left centrum semiovale. There is superimposed ischemic gliosis in the white matter that is confluent. Generalized atrophy. No evidence of acute hemorrhage, hydrocephalus, collection, or mass. Vascular: Grossly preserved major flow voids Skull and upper cervical spine: No gross bony lesion Sinuses/Orbits: Bilateral cataract resection Other: Significantly motion degraded study which was truncated. IMPRESSION: 1. Subcentimeter acute infarct in the low right parietal cortex. 2. Background of atrophy and chronic small vessel ischemia that is advanced-especially for age. 3. Motion degraded and truncated study. Electronically Signed   By: Monte Fantasia M.D.   On: 03/23/2019 08:55   US Carotid Bilateral  Result Date: 03/24/2019 CLINICAL DATA:  Acute stroke.  Hypertension, syncope. EXAM: BILATERAL CAROTID DUPLEX ULTRASOUND TECHNIQUE: Pearline Cables scale imaging, color Doppler and duplex ultrasound were performed of bilateral carotid and vertebral arteries in the neck. COMPARISON:  05/30/2011 FINDINGS: Criteria: Quantification of carotid stenosis is based on velocity parameters that correlate the residual internal carotid diameter with NASCET-based stenosis levels, using the diameter of the distal internal carotid lumen as the denominator for stenosis measurement. The following velocity measurements were obtained: RIGHT ICA: 87/33 cm/sec CCA: 53/29 cm/sec SYSTOLIC ICA/CCA RATIO:  1.9 ECA: 61 cm/sec LEFT ICA: 90/34 cm/sec CCA: 92/42 cm/sec SYSTOLIC ICA/CCA RATIO:  1.3 ECA: 87 cm/sec RIGHT CAROTID ARTERY: Mild partially calcified plaque in distal common carotid artery, bulb, extending into proximal internal and external carotid arteries resulting in at least mild stenosis. Normal waveforms and color Doppler signal throughout however. RIGHT VERTEBRAL ARTERY:  Normal flow direction and waveform. LEFT CAROTID ARTERY:  Eccentric partially calcified plaque in the bulb extending into proximal external and internal carotid arteries resulting in at least mild stenosis. Normal waveforms and color Doppler signal throughout however. LEFT VERTEBRAL ARTERY:  Normal flow direction and waveform. IMPRESSION: 1. Bilateral carotid bifurcation plaque resulting in less than 50% diameter ICA stenosis. 2. Antegrade bilateral vertebral arterial flow. Electronically Signed   By: Lucrezia Europe M.D.   On: 03/24/2019 07:36    EKG:   Orders placed or performed during the hospital encounter of 03/13/19   EKG 12-Lead   EKG 12-Lead   EKG 12-Lead   EKG 12-Lead      Management plans discussed with the patient, family and they are in agreement.  CODE STATUS:     Code Status Orders  (From admission, onward)         Start     Ordered   03/15/19 1347  Do not attempt resuscitation (DNR)  Continuous    Question Answer Comment  In the event of cardiac or respiratory ARREST Do not call a code blue   In the event of cardiac or respiratory ARREST Do not perform Intubation, CPR, defibrillation or ACLS   In the event of cardiac or respiratory ARREST Use medication by any route, position, wound care, and other measures to relive pain and suffering. May use oxygen, suction and manual treatment of airway obstruction as needed for comfort.      03/15/19 1346        Code Status History    Date Active Date Inactive Code Status Order ID Comments User Context   03/13/2019 1213 03/15/2019 1346 Full Code [id number]  Lang Snow, NP ED   03/05/2019 0316 03/08/2019 1856 Full Code [id number]  Lance Coon, MD Inpatient   Advance Care Planning Activity      TOTAL TIME TAKING CARE OF THIS PATIENT: 45  minutes.   Note: This dictation was prepared with Dragon dictation along with smaller phrase technology. Any transcriptional errors that result from this process are unintentional.   @[facility] @  on 03/24/2019 at 2:57 PM  Between 7am to  6pm - Pager - [phone]  After 6pm go to [email or web address] - password [email or web address]  Office  [phone]  CC: Primary care physician; Olin Hauser, DO

## 2019-03-24 NOTE — Plan of Care (Signed)
  Problem: Safety: Goal: Ability to remain free from injury will improve Outcome: Progressing   

## 2019-03-24 NOTE — Progress Notes (Signed)
Report called to [facility] / verbalized an understanding

## 2019-03-24 NOTE — Progress Notes (Signed)
Medline was removed by pt on assessment. On assessment midline was intact. Will continue to monitor.

## 2019-03-27 ENCOUNTER — Telehealth: Payer: Self-pay

## 2019-03-27 LAB — HCV RNA QUANT RFLX ULTRA OR GENOTYP
HCV RNA Qnt(log copy/mL): UNDETERMINED log10 IU/mL
HepC Qn: NOT DETECTED IU/mL

## 2019-03-27 NOTE — Telephone Encounter (Signed)
Patient discharged to SNF.

## 2019-03-30 ENCOUNTER — Telehealth: Payer: Medicaid Other

## 2019-03-30 ENCOUNTER — Ambulatory Visit: Payer: Self-pay | Admitting: Licensed Clinical Social Worker

## 2019-03-30 NOTE — Chronic Care Management (AMB) (Signed)
  Care Management   Follow Up Note   03/30/2019 Name: Donald [name]. MRN: [id number] DOB: Jan 23, 1961  Referred by: Olin Hauser, DO Reason for referral : Care Coordination   Donald [name]. is a 58 y.o. year old male who is a primary care patient of Olin Hauser, DO. The care management team was consulted for assistance with care management and care coordination needs.    Review of patient status, including review of consultants reports, relevant laboratory and other test results, and collaboration with appropriate care team members and the patient's provider was performed as part of comprehensive patient evaluation and provision of chronic care management services.    LCSW completed CCM outreach attempt today but was unable to reach patient successfully. A HIPPA compliant voice message was left encouraging patient to return call once available. LCSW rescheduled CCM SW appointment as well.  A HIPPA compliant phone message was left for the patient providing contact information and requesting a return call.   Donald [name], BSW, MSW, [email or web address] Phone: [phone]

## 2019-03-31 ENCOUNTER — Encounter (INDEPENDENT_AMBULATORY_CARE_PROVIDER_SITE_OTHER): Payer: Medicaid Other | Admitting: Vascular Surgery

## 2019-04-03 ENCOUNTER — Ambulatory Visit: Payer: Medicaid Other | Admitting: Family Medicine

## 2019-04-10 ENCOUNTER — Ambulatory Visit: Payer: Medicaid Other | Admitting: *Deleted

## 2019-04-10 ENCOUNTER — Ambulatory Visit (INDEPENDENT_AMBULATORY_CARE_PROVIDER_SITE_OTHER): Payer: Medicaid Other | Admitting: Nurse Practitioner

## 2019-04-10 DIAGNOSIS — I739 Peripheral vascular disease, unspecified: Secondary | ICD-10-CM

## 2019-04-10 NOTE — Chronic Care Management (AMB) (Signed)
  Chronic Care Management   Follow Up Note   04/10/2019 Name: Donald [name]. MRN: [id number] DOB: [birth year]  Chronic Care Management   Follow Up Note   04/10/2019 Name: Donald [name]. MRN: [id number] DOB: [birth year]  Referred by: Olin Hauser, DO Reason for referral : Chronic Care Management (F/U on Caregiver/patient pla)   [name] is a 58 y.o. year old male who is a primary care patient of Olin Hauser, DO. The CCM team was consulted for assistance with chronic disease management and care coordination needs.    Review of patient status, including review of consultants reports, relevant laboratory and other test results, and collaboration with appropriate care team members and the patient's provider was performed as part of comprehensive patient evaluation and provision of chronic care management services.    Goals Addressed            This Visit's Progress   . RNCM- I can not care for my son he's not well (pt-stated)       Current Barriers:  Marland Kitchen Knowledge Deficits related to long term care process . Lacks caregiver support.   Nurse Case Manager Clinical Goal(s):  Marland Kitchen Over the next 90 days, patient's care giver will verbalize understanding of the long term plan of care for the patient.   Interventions:  . Advised patient's care giver to inquire about meeting patient and facility rep at patient's upcoming MD appts(cardiology and podiatry both 8/4)  . Provided education to patient re: Long term Care Medicaid, requested patient talk with the SNF SW about getting this started ASAP . Collaborated with LCSW via EMR regarding patient's current facility placement. . Discussed plans with patient for ongoing care management follow up and provided patient with direct contact information for care management team. . Empathetic listening provided to caregiver about recent frustrations related to patient's admissions and sickness. She stated she was able to  visit him at [facility] last Friday and was encouraged he recognized her. She stated he still did not look well to her. She stated she was worried about him losing some toes or his whole foot. She stated they are working with him in therapy. She is hopeful he could be moved closer related to she has poor vision and can not see well driving to see him.    Patient Self Care Activities:  . Currently UNABLE TO independently complete ADLs or IADLs   Initial goal documentation         The care management team will reach out to the patient again over the next 30 days.  The patient has been provided with contact information for the care management team and has been advised to call with any health related questions or concerns.    Merlene Morse Marisa Hage RN, BSN Nurse Case Pharmacist, [facility]  [phone]) [employer]

## 2019-04-17 ENCOUNTER — Other Ambulatory Visit (INDEPENDENT_AMBULATORY_CARE_PROVIDER_SITE_OTHER): Payer: Self-pay | Admitting: Nurse Practitioner

## 2019-04-17 ENCOUNTER — Ambulatory Visit (INDEPENDENT_AMBULATORY_CARE_PROVIDER_SITE_OTHER): Payer: Medicaid Other | Admitting: Nurse Practitioner

## 2019-04-17 ENCOUNTER — Other Ambulatory Visit: Payer: Self-pay

## 2019-04-17 ENCOUNTER — Encounter (INDEPENDENT_AMBULATORY_CARE_PROVIDER_SITE_OTHER): Payer: Self-pay | Admitting: Nurse Practitioner

## 2019-04-17 VITALS — BP 132/95 | HR 110 | Resp 10 | Ht 69.0 in | Wt 120.0 lb

## 2019-04-17 DIAGNOSIS — I10 Essential (primary) hypertension: Secondary | ICD-10-CM

## 2019-04-17 DIAGNOSIS — G6289 Other specified polyneuropathies: Secondary | ICD-10-CM

## 2019-04-17 DIAGNOSIS — I739 Peripheral vascular disease, unspecified: Secondary | ICD-10-CM | POA: Diagnosis not present

## 2019-04-17 NOTE — Progress Notes (Signed)
SUBJECTIVE:  Patient ID: Donald Handy., male    DOB: 07/02/1961, 58 y.o.   MRN: [id number] Chief Complaint  Patient presents with  . Follow-up    HPI  Donald [name]. is a 58 y.o. male presents today after recent extensive invasive procedure including:  1. Left common femoral, profunda femoris, and superficial femoral artery endarterectomies 2.   Right common femoral, profunda femoris, and superficial femoral artery endarterectomies 3.   Catheter placement into aorta from bilateral femoral approach 4.   Bilateral external iliac stent placements with 8 mm diameter by 10 cm length via bond stents 5.   Bilateral common iliac stent placements with 8 mm diameter by 58 mm length lifestream stents 6.   4 stent placements to the aorta to realign both iliac arteries up to the base of both renal arteries using two 9 mm diameter by 58 mm length lifestream stents and two 10 mm diameter by 58 mm length lifestream stents  Today the patient states that the wounds are doing well.  His right groin had a little bit of a look like a blistered area that was ruptured by the physician at his facility per the patient.  The left lower extremity wound is largely intact with a little bit of slough near the top portion.  Patient has some lower extremity wounds currently being managed by the wound center.  Past Medical History:  Diagnosis Date  . Depression   . Emphysema of lung ([place])   . Essential hypertension   . History of chicken pox   . Low weight   . Neuropathy    bilateral lower legs  . Peripheral vascular disease ([place])   . Prostate disease   . Seasonal allergies   . Seizures ([place])   . Wears dentures    full upper    Past Surgical History:  Procedure Laterality Date  . ABDOMINAL AORTIC ENDOVASCULAR STENT GRAFT Bilateral 03/16/2019   Procedure: AORTIC ILIAC STENT;  Surgeon: Algernon Huxley, MD;  Location: [facility] ORS;  Service: Vascular;  Laterality: Bilateral;  . CATARACT EXTRACTION W/PHACO  Left 05/18/2018   Procedure: CATARACT EXTRACTION PHACO AND INTRAOCULAR LENS PLACEMENT ([place]) LEFT;  Surgeon: Leandrew Koyanagi, MD;  Location: [place];  Service: Ophthalmology;  Laterality: Left;  . COLONOSCOPY WITH PROPOFOL N/A 02/25/2016   Procedure: COLONOSCOPY WITH PROPOFOL;  Surgeon: [name], MD;  Location: [facility] ENDOSCOPY;  Service: Endoscopy;  Laterality: N/A;  . ENDARTERECTOMY FEMORAL Bilateral 03/16/2019   Procedure: ENDARTERECTOMY FEMORAL;  Surgeon: Algernon Huxley, MD;  Location: [facility] ORS;  Service: Vascular;  Laterality: Bilateral;  . EYE SURGERY  2015   Cataract  . LOWER EXTREMITY ANGIOGRAPHY Left 03/13/2019   Procedure: Lower Extremity Angiography (LEFT);  Surgeon: Algernon Huxley, MD;  Location: [place] [facility];  Service: Cardiovascular;  Laterality: Left;  Marland Kitchen VASCULAR SURGERY     "7 stents in legs"    Social History   Socioeconomic History  . Marital status: Single    Spouse name: Not on file  . Number of children: Not on file  . Years of education: Not on file  . Highest education level: Not on file  Occupational History  . Not on file  Social Needs  . Financial resource strain: Not on file  . Food insecurity    Worry: Not on file    Inability: Not on file  . Transportation needs    Medical: Not on file    Non-medical: Not on  file  Tobacco Use  . Smoking status: Former Smoker    Packs/day: 0.50    Years: 35.00    Pack years: 17.50    Types: Cigarettes    Quit date: 01/25/2018    Years since quitting: 1.2  . Smokeless tobacco: Former Systems developer    Quit date: 08/31/2016  . Tobacco comment: Previously quit in 09/2016 on Chantix until 09/2017  Substance and Sexual Activity  . Alcohol use: Not Currently    Alcohol/week: 0.0 standard drinks    Comment: occ  . Drug use: No  . Sexual activity: Not on file  Lifestyle  . Physical activity    Days per week: 0 days    Minutes per session: Not on file  . Stress: Not on file  Relationships  . Social  Herbalist on phone: Not on file    Gets together: Not on file    Attends religious service: Not on file    Active member of club or organization: Not on file    Attends meetings of clubs or organizations: Not on file    Relationship status: Not on file  . Intimate partner violence    Fear of current or ex partner: No    Emotionally abused: No    Physically abused: No    Forced sexual activity: No  Other Topics Concern  . Not on file  Social History Narrative  . Not on file    Family History  Problem Relation Age of Onset  . Ulcers Father        bleeding  . Cancer Mother        Breast    No Known Allergies   Review of Systems   Review of Systems: Negative Unless Checked Constitutional: [] Weight loss  [] Fever  [] Chills Cardiac: [] Chest pain   []  Atrial Fibrillation  [] Palpitations   [] Shortness of breath when laying flat   [] Shortness of breath with exertion. [] Shortness of breath at rest Vascular:  [] Pain in legs with walking   [] Pain in legs with standing [] Pain in legs when laying flat   [] Claudication    [] Pain in feet when laying flat    [] History of DVT   [] Phlebitis   [] Swelling in legs   [] Varicose veins   [] Non-healing ulcers Pulmonary:   [] Uses home oxygen   [] Productive cough   [] Hemoptysis   [] Wheeze  [] COPD   [] Asthma Neurologic:  [] Dizziness   [] Seizures  [] Blackouts [] History of stroke   [] History of TIA  [] Aphasia   [] Temporary Blindness   [] Weakness or numbness in arm   [x] Weakness or numbness in leg Musculoskeletal:   [] Joint swelling   [] Joint pain   [] Low back pain  []  History of Knee Replacement [] Arthritis [] back Surgeries  []  Spinal Stenosis    Hematologic:  [] Easy bruising  [] Easy bleeding   [] Hypercoagulable state   [] Anemic Gastrointestinal:  [] Diarrhea   [] Vomiting  [] Gastroesophageal reflux/heartburn   [] Difficulty swallowing. [] Abdominal pain Genitourinary:  [] Chronic kidney disease   [] Difficult urination  [] Anuric   [] Blood in urine  [] Frequent urination  [] Burning with urination   [] Hematuria Skin:  [] Rashes   [] Ulcers [x] Wounds Psychological:  [] History of anxiety   []  History of major depression  []  Memory Difficulties      OBJECTIVE:   Physical Exam  BP (!) 132/95 (BP Location: Left Arm, Patient Position: Sitting, Cuff Size: Normal)   Pulse (!) 110   Resp 10   Ht 5\' 9"  (1.753 m)  Wt 120 lb (54.4 kg)   BMI 17.72 kg/m   Gen: WD/WN, NAD Head: [place]/AT, No temporalis wasting.  Ear/Nose/Throat: Hearing grossly intact, nares w/o erythema or drainage Eyes: PER, EOMI, sclera nonicteric.  Neck: Supple, no masses.  No JVD.  Pulmonary:  Good air movement, no use of accessory muscles.  Cardiac: RRR Vascular:  Unable to palpate pedal pulses due to bandages however  feet are warm with good capillary refill. Vessel Right Left  Radial Palpable Palpable  Gastrointestinal: soft, non-distended. No guarding/no peritoneal signs.  Musculoskeletal: M/S 5/5 throughout.  No deformity or atrophy.  Neurologic: Pain and light touch intact in extremities.  Symmetrical.  Speech is fluent. Motor exam as listed above. Psychiatric: Judgment intact, Mood & affect appropriate for pt's clinical situation. Dermatologic: No Venous rashes. No Ulcers Noted.  No changes consistent with cellulitis. Lymph : No Cervical lymphadenopathy, no lichenification or skin changes of chronic lymphedema.       ASSESSMENT AND PLAN:  1. PAD (peripheral artery disease) (HCC) Overall the patient's groin sites look well.  We will have the patient return to the office in 4 weeks for noninvasive studies.  Patient is advised to contact her office sooner for appears that his wounds are appearing to delay healing or worsen.  Overall the patient feels much better and denies any rest pain at this time. - VAS [place] ABI WITH/WO TBI; Future  2. Other polyneuropathy Patient still endorses some numbness and tingling in his lower extremities likely due to known neuropathy   3. Essential hypertension Continue antihypertensive medications as already ordered, these medications have been reviewed and there are no changes at this time.    Current Outpatient Medications on File Prior to Visit  Medication Sig Dispense Refill  . acetaminophen (TYLENOL) 500 MG tablet Take 500 mg by mouth every 6 (six) hours as needed.    Marland Kitchen albuterol (VENTOLIN HFA) 108 (90 Base) MCG/ACT inhaler Inhale 2 puffs into the lungs every 6 (six) hours as needed for wheezing or shortness of breath. 8.5 g 2  . apixaban (ELIQUIS) 5 MG TABS tablet Take 1 tablet (5 mg total) by mouth 2 (two) times daily. 60 tablet   . aspirin EC 81 MG EC tablet Take 1 tablet (81 mg total) by mouth daily.    Marland Kitchen atorvastatin (LIPITOR) 40 MG tablet Take 1 tablet (40 mg total) by mouth daily at 6 PM.    . cholecalciferol (VITAMIN D) 25 MCG tablet Take 1 tablet (1,000 Units total) by mouth daily.    . cloNIDine (CATAPRES) 0.1 MG tablet Take 1 tablet (0.1 mg total) by mouth 2 (two) times daily. 60 tablet 0  . diltiazem (CARDIZEM CD) 240 MG 24 hr capsule Take 1 capsule (240 mg total) by mouth daily.    . Fluticasone-Salmeterol (ADVAIR) 250-50 MCG/DOSE AEPB Inhale 1 puff into the lungs 2 (two) times daily. 60 each 11  . folic acid (FOLVITE) 1 MG tablet Take 1 tablet (1 mg total) by mouth daily.    Marland Kitchen gabapentin (NEURONTIN) 100 MG capsule Take 100 mg by mouth daily.    . metoprolol succinate (TOPROL-XL) 50 MG 24 hr tablet Take 2 tablets (100 mg total) by mouth daily. 90 tablet 1  . Multiple Vitamin (MULTIVITAMIN WITH MINERALS) TABS tablet Take 1 tablet by mouth daily.    . Nutritional Supplements (FEEDING SUPPLEMENT, NEPRO CARB STEADY,) LIQD Take 237 mLs by mouth 2 (two) times daily between meals.  0  . oxyCODONE-acetaminophen (PERCOCET/ROXICET) 5-325 MG tablet Take  by mouth every 4 (four) hours as needed for severe pain.    . polyethylene glycol (MIRALAX / GLYCOLAX) 17 g packet Take 17 g by mouth daily. 14 each 0  . tamsulosin  (FLOMAX) 0.4 MG CAPS capsule Take 1 capsule (0.4 mg total) by mouth daily after supper. 90 capsule 3  . thiamine 100 MG tablet Take 1 tablet (100 mg total) by mouth daily.    . vitamin C (VITAMIN C) 250 MG tablet Take 1 tablet (250 mg total) by mouth 2 (two) times daily.    . Hydrocortisone (GERHARDT'S BUTT CREAM) CREA Apply 1 application topically 3 (three) times daily.    . megestrol (MEGACE) 40 MG tablet Take 2-4 tablets (80-160 mg total) by mouth daily. Start with 2 tablets a day, then gradually increase as tolerated up to 4 per day 120 tablet 0   No current facility-administered medications on file prior to visit.     There are no Patient Instructions on file for this visit. No follow-ups on file.   Kris Hartmann, NP  This note was completed with Sales executive.  Any errors are purely unintentional.

## 2019-04-20 ENCOUNTER — Other Ambulatory Visit: Payer: Self-pay | Admitting: Internal Medicine

## 2019-04-20 DIAGNOSIS — I208 Other forms of angina pectoris: Secondary | ICD-10-CM

## 2019-04-27 ENCOUNTER — Other Ambulatory Visit: Payer: Self-pay

## 2019-04-27 ENCOUNTER — Encounter
Admission: RE | Admit: 2019-04-27 | Discharge: 2019-04-27 | Disposition: A | Discharge status: Home / Self Care | Payer: Medicaid Other | Source: Ambulatory Visit | Attending: Internal Medicine | Admitting: Internal Medicine

## 2019-04-27 DIAGNOSIS — I208 Other forms of angina pectoris: Secondary | ICD-10-CM | POA: Insufficient documentation

## 2019-04-27 MED ORDER — TECHNETIUM TC 99M TETROFOSMIN IV KIT
31.3700 | PACK | Freq: Once | INTRAVENOUS | Status: AC | PRN
  Administered 2019-04-27: 12:00:00 31.37 via INTRAVENOUS

## 2019-04-27 MED ORDER — REGADENOSON 0.4 MG/5ML IV SOLN
0.4000 mg | Freq: Once | INTRAVENOUS | Status: AC
  Administered 2019-04-27: 12:00:00 0.4 mg via INTRAVENOUS
  Filled 2019-04-27: qty 5

## 2019-04-27 MED ORDER — TECHNETIUM TC 99M TETROFOSMIN IV KIT
10.4900 | PACK | Freq: Once | INTRAVENOUS | Status: AC | PRN
  Administered 2019-04-27: 11:00:00 10.49 via INTRAVENOUS

## 2019-05-04 ENCOUNTER — Ambulatory Visit: Payer: Self-pay | Admitting: Licensed Clinical Social Worker

## 2019-05-04 ENCOUNTER — Telehealth: Payer: Self-pay

## 2019-05-04 NOTE — Chronic Care Management (AMB) (Signed)
  Care Management   Follow Up Note   05/04/2019 Name: [name]. MRN: [id number] DOB: 18-Jul-1961  Referred by: Olin Hauser, DO Reason for referral : Care Coordination   Donald [name]. is a 58 y.o. year old male who is a primary care patient of Olin Hauser, DO. The care management team was consulted for assistance with care management and care coordination needs.    Review of patient status, including review of consultants reports, relevant laboratory and other test results, and collaboration with appropriate care team members and the patient's provider was performed as part of comprehensive patient evaluation and provision of chronic care management services.    LCSW completed CCM outreach attempt today but was unable to reach patient successfully. A HIPPA compliant voice message was left encouraging patient to return call once available. LCSW rescheduled CCM SW appointment as well.  A HIPPA compliant phone message was left for the patient providing contact information and requesting a return call.   Eula Fried, BSW, MSW, [email or web address] Phone: [phone]

## 2019-05-11 LAB — NM MYOCAR MULTI W/SPECT W/WALL MOTION / EF
Estimated workload: 1 METS
Exercise duration (min): 1 min
Exercise duration (sec): 0 s
LV dias vol: 64 mL (ref 62–150)
LV sys vol: 19 mL
MPHR: 164 {beats}/min
Peak HR: 93 {beats}/min
Percent HR: 56 %
Rest HR: 64 {beats}/min
SDS: 3
SRS: 0
SSS: 0
TID: 1.02

## 2019-05-16 ENCOUNTER — Encounter (INDEPENDENT_AMBULATORY_CARE_PROVIDER_SITE_OTHER): Payer: Self-pay | Admitting: Vascular Surgery

## 2019-05-16 ENCOUNTER — Other Ambulatory Visit: Payer: Self-pay

## 2019-05-16 ENCOUNTER — Ambulatory Visit (INDEPENDENT_AMBULATORY_CARE_PROVIDER_SITE_OTHER): Payer: Medicaid Other | Admitting: Vascular Surgery

## 2019-05-16 ENCOUNTER — Ambulatory Visit (INDEPENDENT_AMBULATORY_CARE_PROVIDER_SITE_OTHER): Payer: Medicaid Other

## 2019-05-16 VITALS — BP 128/87 | HR 90 | Resp 17 | Ht 68.0 in | Wt 114.0 lb

## 2019-05-16 DIAGNOSIS — I10 Essential (primary) hypertension: Secondary | ICD-10-CM

## 2019-05-16 DIAGNOSIS — I739 Peripheral vascular disease, unspecified: Secondary | ICD-10-CM | POA: Diagnosis not present

## 2019-05-16 DIAGNOSIS — E43 Unspecified severe protein-calorie malnutrition: Secondary | ICD-10-CM

## 2019-05-16 DIAGNOSIS — I70261 Atherosclerosis of native arteries of extremities with gangrene, right leg: Secondary | ICD-10-CM

## 2019-05-16 DIAGNOSIS — I70269 Atherosclerosis of native arteries of extremities with gangrene, unspecified extremity: Secondary | ICD-10-CM | POA: Insufficient documentation

## 2019-05-16 NOTE — Assessment & Plan Note (Signed)
blood pressure control important in reducing the progression of atherosclerotic disease. On appropriate oral medications.  

## 2019-05-16 NOTE — Assessment & Plan Note (Signed)
He has some persistent ulceration bilaterally and his right third toe may still need to be removed.  He saw podiatry last week and sees them again later this month. His ABIs today are 1.13 bilaterally with brisk waveforms and normal digital pressures bilaterally.  His blood flow is adequate for healing although a digital amputation may still be necessary.  This is still a marked improvement from what would have been expected at the time of his admission 2 months ago.  He will continue his current medical regimen.  We will see him back in 3 months with noninvasive studies.

## 2019-05-16 NOTE — Progress Notes (Signed)
Patient ID: Donald Handy., male   DOB: Oct 28, 1960, 58 y.o.   MRN: [id number]  Chief Complaint  Patient presents with  . Follow-up    ultrasound    HPI Donald [name]. is a 58 y.o. male.  Patient returns about 2 months after extensive revascularization realigning his aorta from the renal arteries down to the femoral arteries with bilateral femoral endarterectomies.  He has done amazingly well from this and much better than I would have expected.  He will probably lose his third toe on the right and has ulcers on the great toe bilaterally that the podiatrist think may heal.  At the time of his admission, we were expecting bilateral above-knee amputations if he survived.  He wants to try to walk.  He was almost in the fetal position at his presentation.  He is using a wheelchair at this point but seems to have much improvement in his legs since then.  His ABIs today are 1.13 bilaterally with brisk waveforms and normal digital pressures bilaterally   Past Medical History:  Diagnosis Date  . Depression   . Emphysema of lung ([place])   . Essential hypertension   . History of chicken pox   . Low weight   . Neuropathy    bilateral lower legs  . Peripheral vascular disease ([place])   . Prostate disease   . Seasonal allergies   . Seizures ([place])   . Wears dentures    full upper    Past Surgical History:  Procedure Laterality Date  . ABDOMINAL AORTIC ENDOVASCULAR STENT GRAFT Bilateral 03/16/2019   Procedure: AORTIC ILIAC STENT;  Surgeon: Algernon Huxley, MD;  Location: [facility];  Service: Vascular;  Laterality: Bilateral;  . CATARACT EXTRACTION W/PHACO Left 05/18/2018   Procedure: CATARACT EXTRACTION PHACO AND INTRAOCULAR LENS PLACEMENT ([place]) LEFT;  Surgeon: Leandrew Koyanagi, MD;  Location: [place];  Service: Ophthalmology;  Laterality: Left;  . COLONOSCOPY WITH PROPOFOL N/A 02/25/2016   Procedure: COLONOSCOPY WITH PROPOFOL;  Surgeon: [name], MD;  Location: [facility];   Service: Endoscopy;  Laterality: N/A;  . ENDARTERECTOMY FEMORAL Bilateral 03/16/2019   Procedure: ENDARTERECTOMY FEMORAL;  Surgeon: Algernon Huxley, MD;  Location: [facility];  Service: Vascular;  Laterality: Bilateral;  . EYE SURGERY  2015   Cataract  . LOWER EXTREMITY ANGIOGRAPHY Left 03/13/2019   Procedure: Lower Extremity Angiography (LEFT);  Surgeon: Algernon Huxley, MD;  Location: [place] [facility];  Service: Cardiovascular;  Laterality: Left;  Marland Kitchen VASCULAR SURGERY     "7 stents in legs"      No Known Allergies  Current Outpatient Medications  Medication Sig Dispense Refill  . acetaminophen (TYLENOL) 500 MG tablet Take 500 mg by mouth every 6 (six) hours as needed.    Marland Kitchen albuterol (VENTOLIN HFA) 108 (90 Base) MCG/ACT inhaler Inhale 2 puffs into the lungs every 6 (six) hours as needed for wheezing or shortness of breath. 8.5 g 2  . apixaban (ELIQUIS) 5 MG TABS tablet Take 1 tablet (5 mg total) by mouth 2 (two) times daily. 60 tablet   . aspirin EC 81 MG EC tablet Take 1 tablet (81 mg total) by mouth daily.    Marland Kitchen atorvastatin (LIPITOR) 40 MG tablet Take 1 tablet (40 mg total) by mouth daily at 6 PM.    . cholecalciferol (VITAMIN D) 25 MCG tablet Take 1 tablet (1,000 Units total) by mouth daily.    . cloNIDine (CATAPRES) 0.1 MG tablet Take  1 tablet (0.1 mg total) by mouth 2 (two) times daily. 60 tablet 0  . diltiazem (CARDIZEM CD) 240 MG 24 hr capsule Take 1 capsule (240 mg total) by mouth daily.    . Fluticasone-Salmeterol (ADVAIR) 250-50 MCG/DOSE AEPB Inhale 1 puff into the lungs 2 (two) times daily. 60 each 11  . folic acid (FOLVITE) 1 MG tablet Take 1 tablet (1 mg total) by mouth daily.    Marland Kitchen gabapentin (NEURONTIN) 100 MG capsule Take 100 mg by mouth daily.    . Hydrocortisone (GERHARDT'S BUTT CREAM) CREA Apply 1 application topically 3 (three) times daily.    . megestrol (MEGACE) 40 MG tablet Take 2-4 tablets (80-160 mg total) by mouth daily. Start with 2 tablets a day, then gradually increase  as tolerated up to 4 per day 120 tablet 0  . metoprolol succinate (TOPROL-XL) 50 MG 24 hr tablet Take 2 tablets (100 mg total) by mouth daily. 90 tablet 1  . Multiple Vitamin (MULTIVITAMIN WITH MINERALS) TABS tablet Take 1 tablet by mouth daily.    . Nutritional Supplements (FEEDING SUPPLEMENT, NEPRO CARB STEADY,) LIQD Take 237 mLs by mouth 2 (two) times daily between meals.  0  . oxyCODONE-acetaminophen (PERCOCET/ROXICET) 5-325 MG tablet Take by mouth every 4 (four) hours as needed for severe pain.    . polyethylene glycol (MIRALAX / GLYCOLAX) 17 g packet Take 17 g by mouth daily. 14 each 0  . tamsulosin (FLOMAX) 0.4 MG CAPS capsule Take 1 capsule (0.4 mg total) by mouth daily after supper. 90 capsule 3  . thiamine 100 MG tablet Take 1 tablet (100 mg total) by mouth daily.    . vitamin C (VITAMIN C) 250 MG tablet Take 1 tablet (250 mg total) by mouth 2 (two) times daily.     No current facility-administered medications for this visit.         Physical Exam BP 128/87 (BP Location: Left Arm)   Pulse 90   Resp 17   Ht 5\' 8"  (1.727 m)   Wt 114 lb (51.7 kg)   BMI 17.33 kg/m  Gen:  WD/WN, NAD Skin: Wounds on the foot are currently dressed. Ext: Feet are warm with good capillary refill and 1+ pedal pulses.     Assessment/Plan:  Essential hypertension blood pressure control important in reducing the progression of atherosclerotic disease. On appropriate oral medications.   Protein-calorie malnutrition, severe Improving his nutritional status would be important for wound healing as well as his rehabilitation and ultimate ambulation.  Atherosclerosis of native arteries of the extremities with gangrene [facility]) He has some persistent ulceration bilaterally and his right third toe may still need to be removed.  He saw podiatry last week and sees them again later this month. His ABIs today are 1.13 bilaterally with brisk waveforms and normal digital pressures bilaterally.  His blood flow  is adequate for healing although a digital amputation may still be necessary.  This is still a marked improvement from what would have been expected at the time of his admission 2 months ago.  He will continue his current medical regimen.  We will see him back in 3 months with noninvasive studies.      Leotis Pain 05/16/2019, 12:13 PM   This note was created with Dragon medical transcription system.  Any errors from dictation are unintentional.

## 2019-05-16 NOTE — Assessment & Plan Note (Signed)
Improving his nutritional status would be important for wound healing as well as his rehabilitation and ultimate ambulation.

## 2019-06-06 ENCOUNTER — Other Ambulatory Visit: Payer: Self-pay | Admitting: Podiatry

## 2019-06-08 ENCOUNTER — Other Ambulatory Visit: Payer: Self-pay

## 2019-06-08 ENCOUNTER — Encounter
Admission: RE | Admit: 2019-06-08 | Discharge: 2019-06-08 | Disposition: A | Discharge status: Home / Self Care | Payer: Medicaid Other | Source: Ambulatory Visit | Attending: Podiatry | Admitting: Podiatry

## 2019-06-08 HISTORY — DX: Hypo-osmolality and hyponatremia: E87.1

## 2019-06-08 HISTORY — DX: Carrier or suspected carrier of methicillin resistant Staphylococcus aureus: Z22.322

## 2019-06-08 HISTORY — DX: Insomnia, unspecified: G47.00

## 2019-06-08 HISTORY — DX: Other lack of coordination: R27.8

## 2019-06-08 HISTORY — DX: Pneumonia, unspecified organism: J18.9

## 2019-06-08 HISTORY — DX: Sepsis, unspecified organism: A41.9

## 2019-06-08 HISTORY — DX: Cerebral infarction, unspecified: I63.9

## 2019-06-08 NOTE — Patient Instructions (Addendum)
Your procedure is scheduled on: Friday 10/9 Report to Day Surgery. [facility] entrance To find out your arrival time please call [phone] between 1PM - 3PM on Thurs. 10/8.  Remember: Instructions that are not followed completely may result in serious medical risk,  up to and including death, or upon the discretion of your surgeon and anesthesiologist your  surgery may need to be rescheduled.     _X__ 1. Do not eat food after midnight the night before your procedure.                 No gum chewing or hard candies. You may drink clear liquids up to 2 hours                 before you are scheduled to arrive for your surgery- DO not drink clear                 liquids within 2 hours of the start of your surgery.                 Clear Liquids include:  water, apple juice without pulp, clear carbohydrate                 drink such as Clearfast of Gatorade, Black Coffee or Tea (Do not add                 anything to coffee or tea).  __X__2.  On the morning of surgery brush your teeth with toothpaste and water, you                may rinse your mouth with mouthwash if you wish.  Do not swallow any toothpaste of mouthwash.     ___ 3.  No Alcohol for 24 hours before or after surgery.   ___ 4.  Do Not Smoke or use e-cigarettes For 24 Hours Prior to Your Surgery.                 Do not use any chewable tobacco products for at least 6 hours prior to                 surgery.  ____  5.  Bring all medications with you on the day of surgery if instructed.   _x___  6.  Notify your doctor if there is any change in your medical condition      (cold, fever, infections).     Do not wear jewelry, make-up, hairpins, clips or nail polish. Do not wear lotions, powders, or perfumes. You may wear deodorant. Do not shave 48 hours prior to surgery. Men may shave face and neck. Do not bring valuables to the hospital.    [facility] is not responsible for any belongings or  valuables.  Contacts, dentures or bridgework may not be worn into surgery. Leave your suitcase in the car. After surgery it may be brought to your room. For patients admitted to the hospital, discharge time is determined by your treatment team.   Patients discharged the day of surgery will not be allowed to drive home.   Please read over the following fact sheets that you were given:    _x___ Take these medicines the morning of surgery with A SIP OF WATER:    1. acetaminophen (TYLENOL) 500 MG tablet  or oxyCODONE-acetaminophen (PERCOCET/ROXICET) 5-325 MG tablet if needed  2. gabapentin (NEURONTIN) 100 MG capsule  3. metoprolol succinate (TOPROL-XL) 50 MG 24 hr tablet  4.diltiazem (CARDIZEM CD) 240 MG 24 hr capsule  5.  6.  ____ Fleet Enema (as directed)   __x__ Use Sage wipes as directed  _x___ Use inhalers on the day of surgery  albuterol (VENTOLIN HFA) 108 (90 Base) MCG/ACT inhaler and bring it with you.            Fluticasone-Salmeterol (ADVAIR) 250-50 MCG/DOSE AEPB  ____ Stop metformin 2 days prior to surgery    ____ Take 1/2 of usual insulin dose the night before surgery. No insulin the morning          of surgery.   __x__ Continue Eliquis and aspirin as per vascular recommendation.  Do not take the morning of surgery  __x__ Stop Anti-inflammatories No ibuprofen Aleve     May take tylenol   _x___ Stop supplements until after surgery.vitamin C (VITAMIN C) 250 MG tablet    ____ Bring C-Pap to the hospital.     Please see instructions and Incentive spirometry for use after the procedure. Complete the enclosed drink 2 hours before arrival for surgery Use wipes as per enclosed instructions the night before and the morning of the procedure

## 2019-06-08 NOTE — Pre-Procedure Instructions (Signed)
Christy at [facility] in [place] received preop  Instructions.  Will send written instructions with Blanch Media (mother) next week.  Claiborne Billings with transportation notified of upcoming surgery to arrange for transportation. Alyse Low will have CBC drawn at the facility on Monday 10/5.

## 2019-06-08 NOTE — Pre-Procedure Instructions (Signed)
Eulogio Ditch NP with [facility] said to continue the Eliquis and the ASA.  Do not take the morning of surgery.

## 2019-06-15 MED ORDER — CEFAZOLIN SODIUM-DEXTROSE 2-4 GM/100ML-% IV SOLN
2.0000 g | INTRAVENOUS | Status: AC
  Administered 2019-06-16: 2 g via INTRAVENOUS

## 2019-06-16 ENCOUNTER — Other Ambulatory Visit: Payer: Self-pay

## 2019-06-16 ENCOUNTER — Encounter
Admission: RE | Disposition: A | Discharge status: Home / Self Care | Payer: Self-pay | Source: Home / Self Care | Attending: Podiatry

## 2019-06-16 ENCOUNTER — Ambulatory Visit
Admission: RE | Admit: 2019-06-16 | Discharge: 2019-06-16 | Disposition: A | Discharge status: Home / Self Care | Payer: Medicaid Other | Attending: Podiatry | Admitting: Podiatry

## 2019-06-16 ENCOUNTER — Other Ambulatory Visit
Admission: RE | Admit: 2019-06-16 | Discharge: 2019-06-16 | Disposition: A | Discharge status: Home / Self Care | Payer: Medicaid Other | Source: Ambulatory Visit | Attending: Podiatry | Admitting: Podiatry

## 2019-06-16 ENCOUNTER — Ambulatory Visit: Payer: Medicaid Other | Admitting: Certified Registered Nurse Anesthetist

## 2019-06-16 ENCOUNTER — Encounter: Payer: Self-pay | Admitting: Certified Registered Nurse Anesthetist

## 2019-06-16 DIAGNOSIS — I96 Gangrene, not elsewhere classified: Secondary | ICD-10-CM | POA: Insufficient documentation

## 2019-06-16 DIAGNOSIS — Z20828 Contact with and (suspected) exposure to other viral communicable diseases: Secondary | ICD-10-CM | POA: Diagnosis not present

## 2019-06-16 DIAGNOSIS — L97519 Non-pressure chronic ulcer of other part of right foot with unspecified severity: Secondary | ICD-10-CM | POA: Insufficient documentation

## 2019-06-16 DIAGNOSIS — M869 Osteomyelitis, unspecified: Secondary | ICD-10-CM | POA: Diagnosis not present

## 2019-06-16 HISTORY — PX: AMPUTATION TOE: SHX6595

## 2019-06-16 LAB — SARS CORONAVIRUS 2 BY RT PCR (HOSPITAL ORDER, PERFORMED IN ~~LOC~~ HOSPITAL LAB): SARS Coronavirus 2: NEGATIVE

## 2019-06-16 SURGERY — AMPUTATION, TOE
Anesthesia: General | Site: Toe | Laterality: Right

## 2019-06-16 MED ORDER — PROPOFOL 500 MG/50ML IV EMUL
INTRAVENOUS | Status: DC | PRN
  Administered 2019-06-16: 75 ug/kg/min via INTRAVENOUS

## 2019-06-16 MED ORDER — OXYCODONE-ACETAMINOPHEN 5-325 MG PO TABS: 1.0000 | ORAL_TABLET | Freq: Four times a day (QID) | ORAL | 0 refills | Status: AC | PRN

## 2019-06-16 MED ORDER — OXYCODONE HCL 5 MG PO TABS: 5.0000 mg | ORAL_TABLET | Freq: Once | ORAL | Status: DC | PRN

## 2019-06-16 MED ORDER — MIDAZOLAM HCL 2 MG/2ML IJ SOLN
INTRAMUSCULAR | Status: DC | PRN
  Administered 2019-06-16: 2 mg via INTRAVENOUS

## 2019-06-16 MED ORDER — LIDOCAINE HCL (CARDIAC) PF 100 MG/5ML IV SOSY
PREFILLED_SYRINGE | INTRAVENOUS | Status: DC | PRN
  Administered 2019-06-16: 60 mg via INTRAVENOUS

## 2019-06-16 MED ORDER — BUPIVACAINE HCL 0.5 % IJ SOLN
INTRAMUSCULAR | Status: DC | PRN
  Administered 2019-06-16: 3 mL

## 2019-06-16 MED ORDER — FENTANYL CITRATE (PF) 100 MCG/2ML IJ SOLN: 25.0000 ug | INTRAMUSCULAR | Status: DC | PRN

## 2019-06-16 MED ORDER — BUPIVACAINE HCL (PF) 0.5 % IJ SOLN
INTRAMUSCULAR | Status: AC
  Filled 2019-06-16: qty 30

## 2019-06-16 MED ORDER — DEXAMETHASONE SODIUM PHOSPHATE 10 MG/ML IJ SOLN
INTRAMUSCULAR | Status: AC
  Filled 2019-06-16: qty 1

## 2019-06-16 MED ORDER — ONDANSETRON HCL 4 MG PO TABS: 4.0000 mg | ORAL_TABLET | Freq: Four times a day (QID) | ORAL | Status: DC | PRN

## 2019-06-16 MED ORDER — MIDAZOLAM HCL 2 MG/2ML IJ SOLN
INTRAMUSCULAR | Status: AC
  Filled 2019-06-16: qty 2

## 2019-06-16 MED ORDER — LACTATED RINGERS IV SOLN
INTRAVENOUS | Status: DC
  Administered 2019-06-16: 10:00:00 via INTRAVENOUS

## 2019-06-16 MED ORDER — LIDOCAINE HCL (PF) 1 % IJ SOLN
INTRAMUSCULAR | Status: DC | PRN
  Administered 2019-06-16: 3 mL

## 2019-06-16 MED ORDER — PROPOFOL 10 MG/ML IV BOLUS
INTRAVENOUS | Status: DC | PRN
  Administered 2019-06-16: 20 mg via INTRAVENOUS
  Administered 2019-06-16: 70 mg via INTRAVENOUS

## 2019-06-16 MED ORDER — OXYCODONE HCL 5 MG/5ML PO SOLN: 5.0000 mg | Freq: Once | ORAL | Status: DC | PRN

## 2019-06-16 MED ORDER — LIDOCAINE HCL (PF) 2 % IJ SOLN
INTRAMUSCULAR | Status: AC
  Filled 2019-06-16: qty 10

## 2019-06-16 MED ORDER — CEFAZOLIN SODIUM-DEXTROSE 2-4 GM/100ML-% IV SOLN
INTRAVENOUS | Status: AC
  Filled 2019-06-16: qty 100

## 2019-06-16 MED ORDER — ONDANSETRON HCL 4 MG/2ML IJ SOLN
INTRAMUSCULAR | Status: DC | PRN
  Administered 2019-06-16: 4 mg via INTRAVENOUS

## 2019-06-16 MED ORDER — POVIDONE-IODINE 7.5 % EX SOLN
Freq: Once | CUTANEOUS | Status: DC
  Filled 2019-06-16: qty 118

## 2019-06-16 MED ORDER — DEXAMETHASONE SODIUM PHOSPHATE 4 MG/ML IJ SOLN
INTRAMUSCULAR | Status: DC | PRN
  Administered 2019-06-16: 5 mg via INTRAVENOUS

## 2019-06-16 MED ORDER — FENTANYL CITRATE (PF) 100 MCG/2ML IJ SOLN
INTRAMUSCULAR | Status: AC
  Filled 2019-06-16: qty 2

## 2019-06-16 MED ORDER — FAMOTIDINE 20 MG PO TABS
20.0000 mg | ORAL_TABLET | Freq: Once | ORAL | Status: AC
  Administered 2019-06-16: 20 mg via ORAL

## 2019-06-16 MED ORDER — PROPOFOL 10 MG/ML IV BOLUS
INTRAVENOUS | Status: AC
  Filled 2019-06-16: qty 20

## 2019-06-16 MED ORDER — ONDANSETRON HCL 4 MG/2ML IJ SOLN
INTRAMUSCULAR | Status: AC
  Filled 2019-06-16: qty 2

## 2019-06-16 MED ORDER — ONDANSETRON HCL 4 MG/2ML IJ SOLN: 4.0000 mg | Freq: Four times a day (QID) | INTRAMUSCULAR | Status: DC | PRN

## 2019-06-16 MED ORDER — FAMOTIDINE 20 MG PO TABS
ORAL_TABLET | ORAL | Status: AC
  Filled 2019-06-16: qty 1

## 2019-06-16 SURGICAL SUPPLY — 43 items
BLADE OSC/SAGITTAL MD 5.5X18 (BLADE) ×2 IMPLANT
BLADE SURG MINI STRL (BLADE) ×2 IMPLANT
BNDG CONFORM 2 STRL LF (GAUZE/BANDAGES/DRESSINGS) ×2 IMPLANT
BNDG CONFORM 3 STRL LF (GAUZE/BANDAGES/DRESSINGS) ×4 IMPLANT
BNDG ELASTIC 4X5.8 VLCR NS LF (GAUZE/BANDAGES/DRESSINGS) ×2 IMPLANT
BNDG ESMARK 4X12 TAN STRL LF (GAUZE/BANDAGES/DRESSINGS) IMPLANT
BNDG GAUZE 4.5X4.1 6PLY STRL (MISCELLANEOUS) ×2 IMPLANT
CANISTER SUCT 1200ML W/VALVE (MISCELLANEOUS) ×2 IMPLANT
COVER WAND RF STERILE (DRAPES) ×2 IMPLANT
CUFF TOURN SGL QUICK 12 (TOURNIQUET CUFF) IMPLANT
CUFF TOURN SGL QUICK 18X4 (TOURNIQUET CUFF) IMPLANT
DRAPE FLUOR MINI C-ARM 54X84 (DRAPES) IMPLANT
DRAPE XRAY CASSETTE 23X24 (DRAPES) IMPLANT
DURAPREP 26ML APPLICATOR (WOUND CARE) ×2 IMPLANT
ELECT REM PT RETURN 9FT ADLT (ELECTROSURGICAL) ×2
ELECTRODE REM PT RTRN 9FT ADLT (ELECTROSURGICAL) ×1 IMPLANT
GAUZE PACKING IODOFORM 1/2 (PACKING) ×2 IMPLANT
GAUZE SPONGE 4X4 12PLY STRL (GAUZE/BANDAGES/DRESSINGS) ×2 IMPLANT
GAUZE XEROFORM 1X8 LF (GAUZE/BANDAGES/DRESSINGS) ×2 IMPLANT
GLOVE BIO SURGEON STRL SZ7.5 (GLOVE) ×2 IMPLANT
GLOVE INDICATOR 8.0 STRL GRN (GLOVE) ×2 IMPLANT
GOWN STRL REUS W/ TWL LRG LVL3 (GOWN DISPOSABLE) ×2 IMPLANT
GOWN STRL REUS W/TWL LRG LVL3 (GOWN DISPOSABLE) ×2
KIT TURNOVER KIT A (KITS) ×2 IMPLANT
LABEL OR SOLS (LABEL) IMPLANT
NEEDLE FILTER BLUNT 18X 1/2SAF (NEEDLE) ×1
NEEDLE FILTER BLUNT 18X1 1/2 (NEEDLE) ×1 IMPLANT
NEEDLE HYPO 25X1 1.5 SAFETY (NEEDLE) ×2 IMPLANT
NS IRRIG 500ML POUR BTL (IV SOLUTION) ×2 IMPLANT
PACK EXTREMITY ARMC (MISCELLANEOUS) ×2 IMPLANT
PAD ABD DERMACEA PRESS 5X9 (GAUZE/BANDAGES/DRESSINGS) IMPLANT
PULSAVAC PLUS IRRIG FAN TIP (DISPOSABLE) ×2
SHIELD FULL FACE ANTIFOG 7M (MISCELLANEOUS) ×2 IMPLANT
SOL .9 NS 3000ML IRR  AL (IV SOLUTION)
SOL .9 NS 3000ML IRR UROMATIC (IV SOLUTION) IMPLANT
STOCKINETTE M/LG 89821 (MISCELLANEOUS) ×2 IMPLANT
STRAP SAFETY 5IN WIDE (MISCELLANEOUS) ×2 IMPLANT
SUT ETHILON 3-0 FS-10 30 BLK (SUTURE)
SUT ETHILON 5-0 FS-2 18 BLK (SUTURE) IMPLANT
SUT VIC AB 4-0 FS2 27 (SUTURE) ×4 IMPLANT
SUTURE EHLN 3-0 FS-10 30 BLK (SUTURE) IMPLANT
SYR 10ML LL (SYRINGE) ×6 IMPLANT
TIP FAN IRRIG PULSAVAC PLUS (DISPOSABLE) ×1 IMPLANT

## 2019-06-16 NOTE — Transfer of Care (Signed)
Immediate Anesthesia Transfer of Care Note  Patient: Donald [name].  Procedure(s) Performed: 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 (Right Toe)  Patient Location: PACU  Anesthesia Type:General  Level of Consciousness: awake, alert , oriented and patient cooperative  Airway & Oxygen Therapy: Patient Spontanous Breathing  Post-op Assessment: Report given to RN and Post -op Vital signs reviewed and stable  Post vital signs: Reviewed and stable  Last Vitals:  Vitals Value Taken Time  BP 143/93 06/16/19 1256  Temp 36.3 C 06/16/19 1256  Pulse 95 06/16/19 1258  Resp 14 06/16/19 1258  SpO2 98 % 06/16/19 1258  Vitals shown include unvalidated device data.  Last Pain:  Vitals:   06/16/19 1256  TempSrc:   PainSc: 0-No pain         Complications: No apparent anesthesia complications

## 2019-06-16 NOTE — H&P (Signed)
HISTORY AND PHYSICAL INTERVAL NOTE:  06/16/2019  12:05 PM  Donald [name].  has presented today for surgery, with the diagnosis of I96 - GANGRENE TOE RIGHT L97.512 ULCER TOE RIGHT FOOT I73.9 PVD.  The various methods of treatment have been discussed with the patient.  No guarantees were given.  After consideration of risks, benefits and other options for treatment, the patient has consented to surgery.  I have reviewed the patients' chart and labs.     A history and physical examination was performed in my office.  The patient was reexamined.  There have been no changes to this history and physical examination.  [name]

## 2019-06-16 NOTE — Anesthesia Postprocedure Evaluation (Signed)
Anesthesia Post Note  Patient: Donald [name].  Procedure(s) Performed: 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 (Right Toe)  Patient location during evaluation: PACU Anesthesia Type: General Level of consciousness: awake and alert Pain management: pain level controlled Vital Signs Assessment: post-procedure vital signs reviewed and stable Respiratory status: spontaneous breathing, nonlabored ventilation, respiratory function stable and patient connected to nasal cannula oxygen Cardiovascular status: blood pressure returned to baseline and stable Postop Assessment: no apparent nausea or vomiting Anesthetic complications: no     Last Vitals:  Vitals:   06/16/19 1326 06/16/19 1349  BP: (!) 154/78 (!) 159/85  Pulse: 82 86  Resp: 14 16  Temp: 36.7 C 36.7 C  SpO2: 99% 100%    Last Pain:  Vitals:   06/16/19 1349  TempSrc: Temporal  PainSc: 0-No pain                 Precious Haws Havyn Ramo

## 2019-06-16 NOTE — Anesthesia Preprocedure Evaluation (Signed)
Anesthesia Evaluation  Patient identified by MRN, date of birth, ID band Patient awake    Reviewed: Allergy & Precautions, H&P , NPO status , Patient's Chart, lab work & pertinent test results  History of Anesthesia Complications Negative for: history of anesthetic complications  Airway Mallampati: III  TM Distance: >3 FB Neck ROM: limited    Dental  (+) Chipped, Poor Dentition, Missing, Upper Dentures   Pulmonary pneumonia, COPD, former smoker,           Cardiovascular Exercise Tolerance: Poor hypertension, (-) angina+ Peripheral Vascular Disease  (-) Past MI and (-) DOE      Neuro/Psych Seizures -,  PSYCHIATRIC DISORDERS  Neuromuscular disease CVA, Residual Symptoms    GI/Hepatic negative GI ROS, Neg liver ROS, neg GERD  ,  Endo/Other  negative endocrine ROS  Renal/GU negative Renal ROS  negative genitourinary   Musculoskeletal   Abdominal   Peds  Hematology negative hematology ROS (+)   Anesthesia Other Findings Past Medical History: No date: Depression No date: Emphysema of lung ([facility]) No date: Essential hypertension No date: History of chicken pox No date: Hyponatremia No date: Insomnia No date: Low weight No date: MRSA nasal colonization No date: Neuropathy     Comment:  bilateral lower legs No date: Peripheral vascular disease ([facility]) No date: Pneumonia No date: Prostate disease No date: Seasonal allergies No date: Seizures ([place]) No date: Sensory ataxia No date: Sepsis ([place]) No date: Stroke [facility]) No date: Wears dentures     Comment:  full upper  Past Surgical History: 03/16/2019: ABDOMINAL AORTIC ENDOVASCULAR STENT GRAFT; Bilateral     Comment:  Procedure: AORTIC ILIAC STENT;  Surgeon: Algernon Huxley,               MD;  Location: [facility];  Service: Vascular;  Laterality:              Bilateral; 05/18/2018: CATARACT EXTRACTION W/PHACO; Left     Comment:  Procedure: CATARACT EXTRACTION PHACO AND  INTRAOCULAR               LENS PLACEMENT ([place]) LEFT;  Surgeon: Leandrew Koyanagi, MD;  Location: [place];  Service:               Ophthalmology;  Laterality: Left; 02/25/2016: COLONOSCOPY WITH PROPOFOL; N/A     Comment:  Procedure: COLONOSCOPY WITH PROPOFOL;  Surgeon: [name], MD;  Location: [facility];  Service: Endoscopy;              Laterality: N/A; 03/16/2019: ENDARTERECTOMY FEMORAL; Bilateral     Comment:  Procedure: ENDARTERECTOMY FEMORAL;  Surgeon: Algernon Huxley, MD;  Location: [facility];  Service: Vascular;                Laterality: Bilateral; 2015: EYE SURGERY     Comment:  Cataract No date: FRACTURE SURGERY     Comment:  ORIF wrist 03/13/2019: LOWER EXTREMITY ANGIOGRAPHY; Left     Comment:  Procedure: Lower Extremity Angiography (LEFT);  Surgeon:              Algernon Huxley, MD;  Location: [place] [facility];                Service: Cardiovascular;  Laterality: Left; No date: VASCULAR SURGERY     Comment:  "7 stents in legs"  BMI    Body Mass Index: 17.43 kg/m      Reproductive/Obstetrics negative OB ROS                            Anesthesia Physical Anesthesia Plan  ASA: IV  Anesthesia Plan: General   Post-op Pain Management:    Induction: Intravenous  PONV Risk Score and Plan: Propofol infusion and TIVA  Airway Management Planned: Natural Airway and Nasal Cannula  Additional Equipment:   Intra-op Plan:   Post-operative Plan:   Informed Consent: I have reviewed the patients History and Physical, chart, labs and discussed the procedure including the risks, benefits and alternatives for the proposed anesthesia with the patient or authorized representative who has indicated his/her understanding and acceptance.     Dental Advisory Given  Plan Discussed with: Anesthesiologist, CRNA and Surgeon  Anesthesia Plan Comments: (Patient consented for risks of anesthesia  including but not limited to:  - adverse reactions to medications - risk of intubation if required - damage to teeth, lips or other oral mucosa - sore throat or hoarseness - Damage to heart, brain, lungs or loss of life  Patient voiced understanding.)        Anesthesia Quick Evaluation

## 2019-06-16 NOTE — Anesthesia Post-op Follow-up Note (Signed)
Anesthesia QCDR form completed.        

## 2019-06-16 NOTE — Discharge Instructions (Signed)

## 2019-06-16 NOTE — Op Note (Signed)
Operative note   Surgeon:Akshith Moncus Lawyer: None    Preop diagnosis: Gangrene distal right third toe    Postop diagnosis: Same    Procedure: Amputation partial right third toe    EBL: Minimal    Anesthesia:local and IV sedation    Hemostasis: None    Specimen: Gangrenous right third toe    Complications: None    Operative indications:Donald [name]. is an 58 y.o. that presents today for surgical intervention.  The risks/benefits/alternatives/complications have been discussed and consent has been given.    Procedure:  Patient was brought into the OR and placed on the operating table in thesupine position. After anesthesia was obtained theright lower extremity was prepped and draped in usual sterile fashion.  Attention was directed to the distal aspect of the right third toe where gangrenous changes were noted to basically the distal aspect.  [place] incision was performed at the level of the PIPJ.  This was dissected back proximally just proximal to the head of the proximal phalanx.  An osteotomy was created and the toe was then disarticulated.  This was sent for pathological examination.  The wound was flushed with copious amounts of irrigation.  Closure was performed with a 4-0 nylon for the skin.  A bulky sterile dressing was applied.  A prescription for Percocet was provided.    Patient tolerated the procedure and anesthesia well.  Was transported from the OR to the PACU with all vital signs stable and vascular status intact. To be discharged per routine protocol.  Will follow up in approximately 1 week in the outpatient clinic.

## 2019-06-17 ENCOUNTER — Encounter: Payer: Self-pay | Admitting: Podiatry

## 2019-06-19 LAB — SURGICAL PATHOLOGY

## 2019-06-22 ENCOUNTER — Telehealth: Payer: Self-pay

## 2019-08-15 ENCOUNTER — Other Ambulatory Visit: Payer: Self-pay

## 2019-08-15 ENCOUNTER — Encounter (INDEPENDENT_AMBULATORY_CARE_PROVIDER_SITE_OTHER): Payer: Self-pay | Admitting: Vascular Surgery

## 2019-08-15 ENCOUNTER — Ambulatory Visit (INDEPENDENT_AMBULATORY_CARE_PROVIDER_SITE_OTHER): Payer: Medicaid Other

## 2019-08-15 ENCOUNTER — Ambulatory Visit (INDEPENDENT_AMBULATORY_CARE_PROVIDER_SITE_OTHER): Payer: Medicaid Other | Admitting: Vascular Surgery

## 2019-08-15 VITALS — BP 106/73 | HR 92 | Resp 16 | Ht 68.0 in | Wt 120.0 lb

## 2019-08-15 DIAGNOSIS — E43 Unspecified severe protein-calorie malnutrition: Secondary | ICD-10-CM | POA: Diagnosis not present

## 2019-08-15 DIAGNOSIS — I10 Essential (primary) hypertension: Secondary | ICD-10-CM | POA: Diagnosis not present

## 2019-08-15 DIAGNOSIS — I70261 Atherosclerosis of native arteries of extremities with gangrene, right leg: Secondary | ICD-10-CM | POA: Diagnosis not present

## 2019-08-15 NOTE — Progress Notes (Signed)
MRN : [id number]  Donald [name]. is a 58 y.o. (June 21, 1961) male who presents with chief complaint of  Chief Complaint  Patient presents with  . Follow-up    ultrasound  .  History of Present Illness: Patient returns today in follow up of PAD.  He is doing physical therapy and walking some now with a walker which is a vast improvement from when we first met him and he was essentially in the fetal position.  His noninvasive studies today show stable, normal ABIs of 0.93 on the right and 0.97 on the left with biphasic to triphasic waveforms.  This is status post an extensive revascularization about 4 to 5 months ago now.  He continues on Eliquis without any signs of bleeding.  He is on Lipitor and aspirin as well.  He is eating better and has gained 6 pounds since his last visit but remains quite underweight.  Current Outpatient Medications  Medication Sig Dispense Refill  . albuterol (VENTOLIN HFA) 108 (90 Base) MCG/ACT inhaler Inhale 2 puffs into the lungs every 6 (six) hours as needed for wheezing or shortness of breath. 8.5 g 2  . apixaban (ELIQUIS) 5 MG TABS tablet Take 1 tablet (5 mg total) by mouth 2 (two) times daily. 60 tablet   . aspirin EC 81 MG EC tablet Take 1 tablet (81 mg total) by mouth daily.    Marland Kitchen atorvastatin (LIPITOR) 40 MG tablet Take 1 tablet (40 mg total) by mouth daily at 6 PM.    . cholecalciferol (VITAMIN D) 25 MCG tablet Take 1 tablet (1,000 Units total) by mouth daily.    . cloNIDine (CATAPRES) 0.1 MG tablet Take 1 tablet (0.1 mg total) by mouth 2 (two) times daily. 60 tablet 0  . diltiazem (CARDIZEM CD) 240 MG 24 hr capsule Take 1 capsule (240 mg total) by mouth daily.    . Fluticasone-Salmeterol (ADVAIR) 250-50 MCG/DOSE AEPB Inhale 1 puff into the lungs 2 (two) times daily. 60 each 11  . folic acid (FOLVITE) 1 MG tablet Take 1 tablet (1 mg total) by mouth daily.    Marland Kitchen gabapentin (NEURONTIN) 100 MG capsule Take 100 mg by mouth daily.    . Hydrocortisone  (GERHARDT'S BUTT CREAM) CREA Apply 1 application topically 3 (three) times daily.    Marland Kitchen loperamide (IMODIUM A-D) 2 MG tablet Take 2 mg by mouth every 6 (six) hours as needed for diarrhea or loose stools.    . megestrol (MEGACE) 40 MG tablet Take 2-4 tablets (80-160 mg total) by mouth daily. Start with 2 tablets a day, then gradually increase as tolerated up to 4 per day 120 tablet 0  . metoprolol succinate (TOPROL-XL) 50 MG 24 hr tablet Take 2 tablets (100 mg total) by mouth daily. 90 tablet 1  . mirtazapine (REMERON) 15 MG tablet Take 15 mg by mouth at bedtime.    . Multiple Vitamin (MULTIVITAMIN WITH MINERALS) TABS tablet Take 1 tablet by mouth daily.    . Nutritional Supplements (FEEDING SUPPLEMENT, NEPRO CARB STEADY,) LIQD Take 237 mLs by mouth 2 (two) times daily between meals. (Patient taking differently: Take 237 mLs by mouth 4 (four) times daily. )  0  . oxyCODONE-acetaminophen (PERCOCET) 5-325 MG tablet Take 1-2 tablets by mouth every 6 (six) hours as needed for severe pain. 20 tablet 0  . polyethylene glycol (MIRALAX / GLYCOLAX) 17 g packet Take 17 g by mouth daily. 14 each 0  . tamsulosin (FLOMAX) 0.4 MG CAPS capsule Take  1 capsule (0.4 mg total) by mouth daily after supper. 90 capsule 3  . thiamine 100 MG tablet Take 1 tablet (100 mg total) by mouth daily.    . vitamin C (VITAMIN C) 250 MG tablet Take 1 tablet (250 mg total) by mouth 2 (two) times daily.    Marland Kitchen acetaminophen (TYLENOL) 500 MG tablet Take 500 mg by mouth every 6 (six) hours as needed for moderate pain.      No current facility-administered medications for this visit.     Past Medical History:  Diagnosis Date  . Depression   . Emphysema of lung ([place])   . Essential hypertension   . History of chicken pox   . Hyponatremia   . Insomnia   . Low weight   . MRSA nasal colonization   . Neuropathy    bilateral lower legs  . Peripheral vascular disease ([place])   . Pneumonia   . Prostate disease   . Seasonal allergies   .  Seizures ([place])   . Sensory ataxia   . Sepsis ([place])   . Stroke ([place])   . Wears dentures    full upper    Past Surgical History:  Procedure Laterality Date  . ABDOMINAL AORTIC ENDOVASCULAR STENT GRAFT Bilateral 03/16/2019   Procedure: AORTIC ILIAC STENT;  Surgeon: Algernon Huxley, MD;  Location: [facility];  Service: Vascular;  Laterality: Bilateral;  . 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 CATARACT EXTRACTION W/PHACO Left 05/18/2018   Procedure: CATARACT EXTRACTION PHACO AND INTRAOCULAR LENS PLACEMENT ([place]) LEFT;  Surgeon: Leandrew Koyanagi, MD;  Location: [facility];  Service: Ophthalmology;  Laterality: Left;  . COLONOSCOPY WITH PROPOFOL N/A 02/25/2016   Procedure: COLONOSCOPY WITH PROPOFOL;  Surgeon: [name], MD;  Location: [facility];  Service: Endoscopy;  Laterality: N/A;  . ENDARTERECTOMY FEMORAL Bilateral 03/16/2019   Procedure: ENDARTERECTOMY FEMORAL;  Surgeon: Algernon Huxley, MD;  Location: [facility];  Service: Vascular;  Laterality: Bilateral;  . EYE SURGERY  2015   Cataract  . FRACTURE SURGERY     ORIF wrist  . LOWER EXTREMITY ANGIOGRAPHY Left 03/13/2019   Procedure: Lower Extremity Angiography (LEFT);  Surgeon: Algernon Huxley, MD;  Location: [facility];  Service: Cardiovascular;  Laterality: Left;  Marland Kitchen VASCULAR SURGERY     "7 stents in legs"     Social History   Tobacco Use  . Smoking status: Former Smoker    Packs/day: 0.50    Years: 35.00    Pack years: 17.50    Types: Cigarettes    Quit date: 01/25/2018    Years since quitting: 1.5  . Smokeless tobacco: Former Systems developer    Quit date: 08/31/2016  . Tobacco comment: Previously quit in 09/2016 on Chantix until 09/2017  Substance Use Topics  . Alcohol use: Not Currently    Alcohol/week: 0.0 standard drinks    Comment: occ  . Drug use: No    Family History  Problem Relation  Age of Onset  . Ulcers Father        bleeding  . Cancer Mother        Breast  No bleeding or clotting disorders  No Known Allergies   REVIEW OF SYSTEMS (Negative unless checked)  Constitutional: '[]' Weight loss  '[]' Fever  '[]' Chills Cardiac: '[]' Chest pain   '[]' Chest pressure   '[]' Palpitations   '[]' Shortness of breath when  laying flat   '[]' Shortness of breath at rest   '[]' Shortness of breath with exertion. Vascular:  '[]' Pain in legs with walking   '[]' Pain in legs at rest   '[]' Pain in legs when laying flat   '[]' Claudication   '[x]' Pain in feet when walking  '[x]' Pain in feet at rest  '[]' Pain in feet when laying flat   '[]' History of DVT   '[]' Phlebitis   '[]' Swelling in legs   '[]' Varicose veins   '[]' Non-healing ulcers Pulmonary:   '[]' Uses home oxygen   '[]' Productive cough   '[]' Hemoptysis   '[]' Wheeze  '[]' COPD   '[]' Asthma Neurologic:  '[]' Dizziness  '[]' Blackouts   '[]' Seizures   '[]' History of stroke   '[]' History of TIA  '[]' Aphasia   '[]' Temporary blindness   '[]' Dysphagia   '[]' Weakness or numbness in arms   '[]' Weakness or numbness in legs Musculoskeletal:  '[x]' Arthritis   '[]' Joint swelling   '[]' Joint pain   '[]' Low back pain Hematologic:  '[]' Easy bruising  '[]' Easy bleeding   '[]' Hypercoagulable state   '[]' Anemic   Gastrointestinal:  '[]' Blood in stool   '[]' Vomiting blood  '[]' Gastroesophageal reflux/heartburn   '[]' Abdominal pain Genitourinary:  '[]' Chronic kidney disease   '[]' Difficult urination  '[]' Frequent urination  '[]' Burning with urination   '[]' Hematuria Skin:  '[]' Rashes   '[]' Ulcers   '[]' Wounds Psychological:  '[]' History of anxiety   '[]'  History of major depression.  Physical Examination  BP 106/73 (BP Location: Left Arm)   Pulse 92   Resp 16   Ht '5\' 8"'  (1.727 m)   Wt 120 lb (54.4 kg)   BMI 18.25 kg/m  Gen:  WD/WN, NAD Head: [place]/AT, No temporalis wasting. Ear/Nose/Throat: Hearing grossly intact, nares w/o erythema or drainage Eyes: Conjunctiva clear. Sclera non-icteric Neck: Supple.  Trachea midline Pulmonary:  Good air movement, no use of accessory  muscles.  Cardiac: RRR, no JVD Vascular:  Vessel Right Left  Radial Palpable Palpable                          PT Palpable Palpable  DP Palpable Palpable    Musculoskeletal: M/S 5/5 throughout.  In a wheelchair today.  No significant lower extremity edema. Neurologic: Sensation grossly intact in extremities.  Symmetrical.  Speech is fluent.  Psychiatric: Judgment intact, Mood & affect appropriate for pt's clinical situation. Dermatologic: No rashes or ulcers noted.  No cellulitis or open wounds.       Labs Recent Results (from the past 2160 hour(s))  SARS Coronavirus 2 by RT PCR (hospital order, performed in [facility] hospital lab) Nasopharyngeal Nasopharyngeal Swab     Status: None   Collection Time: 06/16/19  7:52 AM   Specimen: Nasopharyngeal Swab  Result Value Ref Range   SARS Coronavirus 2 NEGATIVE NEGATIVE    Comment: (NOTE) If result is NEGATIVE SARS-CoV-2 target nucleic acids are NOT DETECTED. The SARS-CoV-2 RNA is generally detectable in upper and lower  respiratory specimens during the acute phase of infection. The lowest  concentration of SARS-CoV-2 viral copies this assay can detect is 250  copies / mL. A negative result does not preclude SARS-CoV-2 infection  and should not be used as the sole basis for treatment or other  patient management decisions.  A negative result may occur with  improper specimen collection / handling, submission of specimen other  than nasopharyngeal swab, presence of viral mutation(s) within the  areas targeted by this assay, and inadequate number of viral copies  (<250 copies / mL). A negative result must be  combined with clinical  observations, patient history, and epidemiological information. If result is POSITIVE SARS-CoV-2 target nucleic acids are DETECTED. The SARS-CoV-2 RNA is generally detectable in upper and lower  respiratory specimens dur ing the acute phase of infection.  Positive  results are indicative of  active infection with SARS-CoV-2.  Clinical  correlation with patient history and other diagnostic information is  necessary to determine patient infection status.  Positive results do  not rule out bacterial infection or co-infection with other viruses. If result is PRESUMPTIVE POSTIVE SARS-CoV-2 nucleic acids MAY BE PRESENT.   A presumptive positive result was obtained on the submitted specimen  and confirmed on repeat testing.  While 2019 novel coronavirus  (SARS-CoV-2) nucleic acids may be present in the submitted sample  additional confirmatory testing may be necessary for epidemiological  and / or clinical management purposes  to differentiate between  SARS-CoV-2 and other Sarbecovirus currently known to infect humans.  If clinically indicated additional testing with an alternate test  methodology [phone]) is advised. The SARS-CoV-2 RNA is generally  detectable in upper and lower respiratory sp ecimens during the acute  phase of infection. The expected result is Negative. Fact Sheet for Patients:  StrictlyIdeas.no Fact Sheet for Healthcare Providers: BankingDealers.co.za This test is not yet approved or cleared by the [place] FDA and has been authorized for detection and/or diagnosis of SARS-CoV-2 by FDA under an Emergency Use Authorization (EUA).  This EUA will remain in effect (meaning this test can be used) for the duration of the COVID-19 declaration under Section 564(b)(1) of the Act, 21 U.S.C. section 360bbb-3(b)(1), unless the authorization is terminated or revoked sooner. Performed at [facility], [address] [place], [place] [address]   Surgical pathology     Status: None   Collection Time: 06/16/19 12:40 PM  Result Value Ref Range   SURGICAL PATHOLOGY      SURGICAL PATHOLOGY CASE: [id number] PATIENT: Lajean Silvius Surgical Pathology Report     Specimen Submitted: A. Third toe, right,  gangrene  Clinical History: I96 - gangrene toe right, L97.512 ulcer toe right foot, I73.9 PVD      DIAGNOSIS: A.  TOE, THIRD RIGHT; AMPUTATION: - NECROSIS OF DISTAL SKIN, SOFT TISSUE, AND UNDERLYING BONE. - CHRONIC ACTIVE OSTEOMYELITIS. - VIABLE SOFT TISSUE AT INKED MARGIN; NEGATIVE FOR OSTEOMYELITIS AT INKED MARGIN.  GROSS DESCRIPTION: A.  Labeled: Gangrene right third toe. Received: In formalin Tissue fragment(s): 1 Size: 2.5 x 1.0 x 1.0 cm Description: Exarticulated toe covered by white skin which shows deep mummified necrotic changes at the tip region. Representatively submitted in 2 cassettes after decalcification as follow: 1-surgical margin perpendicular section 2-representative section   Final Diagnosis performed by Quay Burow, MD.   Electronically signed 06/19/2019 1:04:47PM The electronic signature indicat es that the named Attending Pathologist has evaluated the specimen Technical component performed at [facility], [address] [place], [place] [address] Lab: [phone] Dir: Rush Farmer, MD, MMM  Professional component performed at [facility], [facility], [place], [place], [place] [address] Lab: [phone] Dir: Dellia Nims. Reuel Derby, MD     Radiology No results found.  Assessment/Plan Essential hypertension blood pressure control important in reducing the progression of atherosclerotic disease. On appropriate oral medications.   Protein-calorie malnutrition, severe Improving his nutritional status would be important for wound healing as well as his rehabilitation and ultimate ambulation.  He has gained 6 pounds from his last visit but remains underweight.  Atherosclerosis of native arteries of the extremities with  gangrene ([place]) His digital gangrenous changes were debrided and have healed nicely.  He is increasing his activity. His noninvasive studies today show stable, normal ABIs of 0.93 on the right and 0.97 on the left  with biphasic to triphasic waveforms.  Continue current medical regimen and recheck in 6 months.  At that time, we can discuss potential cessation of Eliquis and transitioning over to aspirin and Plavix.    Leotis Pain, MD  08/15/2019 2:43 PM    This note was created with Dragon medical transcription system.  Any errors from dictation are purely unintentional

## 2019-08-15 NOTE — Assessment & Plan Note (Signed)
His digital gangrenous changes were debrided and have healed nicely.  He is increasing his activity. His noninvasive studies today show stable, normal ABIs of 0.93 on the right and 0.97 on the left with biphasic to triphasic waveforms.  Continue current medical regimen and recheck in 6 months.  At that time, we can discuss potential cessation of Eliquis and transitioning over to aspirin and Plavix.

## 2020-01-10 ENCOUNTER — Telehealth: Payer: Self-pay | Admitting: Family Medicine

## 2020-01-12 NOTE — Telephone Encounter (Signed)
Error

## 2020-01-15 ENCOUNTER — Other Ambulatory Visit: Payer: Medicaid Other

## 2020-01-19 ENCOUNTER — Encounter: Payer: Medicaid Other | Admitting: Family Medicine

## 2020-02-20 ENCOUNTER — Ambulatory Visit (INDEPENDENT_AMBULATORY_CARE_PROVIDER_SITE_OTHER): Payer: Medicaid Other

## 2020-02-20 ENCOUNTER — Ambulatory Visit (INDEPENDENT_AMBULATORY_CARE_PROVIDER_SITE_OTHER): Payer: Medicaid Other | Admitting: Vascular Surgery

## 2020-02-20 ENCOUNTER — Encounter (INDEPENDENT_AMBULATORY_CARE_PROVIDER_SITE_OTHER): Payer: Self-pay | Admitting: Vascular Surgery

## 2020-02-20 ENCOUNTER — Other Ambulatory Visit: Payer: Self-pay

## 2020-02-20 VITALS — BP 153/88 | HR 84 | Ht 68.0 in | Wt 117.0 lb

## 2020-02-20 DIAGNOSIS — E440 Moderate protein-calorie malnutrition: Secondary | ICD-10-CM | POA: Diagnosis not present

## 2020-02-20 DIAGNOSIS — I70261 Atherosclerosis of native arteries of extremities with gangrene, right leg: Secondary | ICD-10-CM

## 2020-02-20 DIAGNOSIS — I10 Essential (primary) hypertension: Secondary | ICD-10-CM

## 2020-02-20 NOTE — Assessment & Plan Note (Signed)
His ABIs today are 1.01 bilaterally with brisk triphasic waveforms and normal digital pressures bilaterally marked improvement after extensive revascularization almost a year ago.  We will do another 41-month checkup and if that looks good we can go to a once annual follow-up.  Continue current medical regimen for now.

## 2020-02-20 NOTE — Progress Notes (Signed)
MRN : [id number]  Donald [name]. is a 59 y.o. (18-Mar-1961) male who presents with chief complaint of  Chief Complaint  Patient presents with   Follow-up    6 mo Abi  .  History of Present Illness: Patient returns today in follow up of his PAD.  Almost a year ago, he underwent extensive revascularization with bilateral femoral endarterectomies and rebuilding of his aorta and iliac arteries with stents.  He was essentially in the fetal position with gangrenous changes when we did this.  He has since done much better and is now walking and has no further wounds or ulcers.  His ABIs today are 1.01 bilaterally with brisk triphasic waveforms and normal digital pressures bilaterally  Current Outpatient Medications  Medication Sig Dispense Refill   acetaminophen (TYLENOL) 500 MG tablet Take 500 mg by mouth every 6 (six) hours as needed for moderate pain.      albuterol (VENTOLIN HFA) 108 (90 Base) MCG/ACT inhaler Inhale 2 puffs into the lungs every 6 (six) hours as needed for wheezing or shortness of breath. 8.5 g 2   apixaban (ELIQUIS) 5 MG TABS tablet Take 1 tablet (5 mg total) by mouth 2 (two) times daily. 60 tablet    apixaban (ELIQUIS) 5 MG TABS tablet Take by mouth.     ascorbic acid (VITAMIN C) 1000 MG tablet Take by mouth.     aspirin EC 81 MG EC tablet Take 1 tablet (81 mg total) by mouth daily.     atorvastatin (LIPITOR) 40 MG tablet Take 1 tablet (40 mg total) by mouth daily at 6 PM.     atorvastatin (LIPITOR) 40 MG tablet Take by mouth.     cholecalciferol (VITAMIN D) 25 MCG tablet Take 1 tablet (1,000 Units total) by mouth daily.     cloNIDine (CATAPRES) 0.1 MG tablet Take 1 tablet (0.1 mg total) by mouth 2 (two) times daily. 60 tablet 0   diltiazem (CARDIZEM CD) 240 MG 24 hr capsule Take 1 capsule (240 mg total) by mouth daily.     diltiazem (TIAZAC) 240 MG 24 hr capsule Take by mouth.     fluticasone (FLONASE) 50 MCG/ACT nasal spray Place into the nose.      Fluticasone-Salmeterol (ADVAIR) 250-50 MCG/DOSE AEPB Inhale 1 puff into the lungs 2 (two) times daily. 60 each 11   folic acid (FOLVITE) 1 MG tablet Take 1 tablet (1 mg total) by mouth daily.     gabapentin (NEURONTIN) 100 MG capsule Take 100 mg by mouth daily.     gabapentin (NEURONTIN) 100 MG capsule Take by mouth.     Hydrocortisone (GERHARDT'S BUTT CREAM) CREA Apply 1 application topically 3 (three) times daily.     loperamide (IMODIUM A-D) 2 MG tablet Take 2 mg by mouth every 6 (six) hours as needed for diarrhea or loose stools.     megestrol (MEGACE) 40 MG tablet Take 2-4 tablets (80-160 mg total) by mouth daily. Start with 2 tablets a day, then gradually increase as tolerated up to 4 per day 120 tablet 0   Melatonin 1 MG/4ML LIQD Take by mouth.     metoprolol succinate (TOPROL-XL) 50 MG 24 hr tablet Take 2 tablets (100 mg total) by mouth daily. 90 tablet 1   metoprolol succinate (TOPROL-XL) 50 MG 24 hr tablet Take by mouth.     mirtazapine (REMERON) 15 MG tablet Take 15 mg by mouth at bedtime.     Multiple Vitamin (MULTIVITAMIN WITH MINERALS) TABS tablet  Take 1 tablet by mouth daily.     Nutritional Supplements (FEEDING SUPPLEMENT, NEPRO CARB STEADY,) LIQD Take 237 mLs by mouth 2 (two) times daily between meals. (Patient taking differently: Take 237 mLs by mouth 4 (four) times daily. )  0   oxyCODONE-acetaminophen (PERCOCET) 5-325 MG tablet Take 1-2 tablets by mouth every 6 (six) hours as needed for severe pain. 20 tablet 0   polyethylene glycol (MIRALAX / GLYCOLAX) 17 g packet Take 17 g by mouth daily. 14 each 0   tamsulosin (FLOMAX) 0.4 MG CAPS capsule Take 1 capsule (0.4 mg total) by mouth daily after supper. 90 capsule 3   tamsulosin (FLOMAX) 0.4 MG CAPS capsule Take by mouth.     thiamine 100 MG tablet Take 1 tablet (100 mg total) by mouth daily.     vitamin C (VITAMIN C) 250 MG tablet Take 1 tablet (250 mg total) by mouth 2 (two) times daily.     No current  facility-administered medications for this visit.    Past Medical History:  Diagnosis Date   Depression    Emphysema of lung ([place])    Essential hypertension    History of chicken pox    Hyponatremia    Insomnia    Low weight    MRSA nasal colonization    Neuropathy    bilateral lower legs   Peripheral vascular disease (H[facility])    Pneumonia    Prostate disease    Seasonal allergies    Seizures ([place])    Sensory ataxia    Sepsis ([place])    Stroke ([place])    Wears dentures    full upper    Past Surgical History:  Procedure Laterality Date   ABDOMINAL AORTIC ENDOVASCULAR STENT GRAFT Bilateral 03/16/2019   Procedure: AORTIC ILIAC STENT;  Surgeon: Algernon Huxley, MD;  Location: [facility];  Service: Vascular;  Laterality: Bilateral;   AMPUTATION TOE Right 06/16/2019   Procedure: 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;  Surgeon: Samara Deist, DPM;  Location: [facility];  Service: Podiatry;  Laterality: Right;   CATARACT EXTRACTION W/PHACO Left 05/18/2018   Procedure: CATARACT EXTRACTION PHACO AND INTRAOCULAR LENS PLACEMENT ([place]) LEFT;  Surgeon: Leandrew Koyanagi, MD;  Location: [place];  Service: Ophthalmology;  Laterality: Left;   COLONOSCOPY WITH PROPOFOL N/A 02/25/2016   Procedure: COLONOSCOPY WITH PROPOFOL;  Surgeon: [name], MD;  Location: [facility];  Service: Endoscopy;  Laterality: N/A;   ENDARTERECTOMY FEMORAL Bilateral 03/16/2019   Procedure: ENDARTERECTOMY FEMORAL;  Surgeon: Algernon Huxley, MD;  Location: [facility];  Service: Vascular;  Laterality: Bilateral;   EYE SURGERY  2015   Cataract   FRACTURE SURGERY     ORIF wrist   LOWER EXTREMITY ANGIOGRAPHY Left 03/13/2019   Procedure: Lower Extremity Angiography (LEFT);  Surgeon: Algernon Huxley, MD;  Location: [facility];  Service: Cardiovascular;  Laterality: Left;   VASCULAR SURGERY     "7 stents in legs"    Social History        Tobacco Use   Smoking status: Former Smoker     Packs/day: 0.50    Years: 35.00    Pack years: 17.50    Types: Cigarettes    Quit date: 01/25/2018    Years since quitting: 1.5   Smokeless tobacco: Former Systems developer    Quit date: 08/31/2016   Tobacco comment: Previously quit in 09/2016 on Chantix until 09/2017  Substance Use Topics   Alcohol use: Not Currently    Alcohol/week: 0.0 standard  drinks    Comment: occ   Drug use: No         Family History  Problem Relation Age of Onset   Ulcers Father        bleeding   Cancer Mother        Breast  No bleeding or clotting disorders  No Known Allergies   REVIEW OF SYSTEMS (Negative unless checked)  Constitutional: [] ?Weight loss  [] ?Fever  [] ?Chills Cardiac: [] ?Chest pain   [] ?Chest pressure   [] ?Palpitations   [] ?Shortness of breath when laying flat   [] ?Shortness of breath at rest   [] ?Shortness of breath with exertion. Vascular:  [] ?Pain in legs with walking   [] ?Pain in legs at rest   [] ?Pain in legs when laying flat   [] ?Claudication   [x] ?Pain in feet when walking  [x] ?Pain in feet at rest  [] ?Pain in feet when laying flat   [] ?History of DVT   [] ?Phlebitis   [] ?Swelling in legs   [] ?Varicose veins   [] ?Non-healing ulcers Pulmonary:   [] ?Uses home oxygen   [] ?Productive cough   [] ?Hemoptysis   [] ?Wheeze  [] ?COPD   [] ?Asthma Neurologic:  [] ?Dizziness  [] ?Blackouts   [] ?Seizures   [] ?History of stroke   [] ?History of TIA  [] ?Aphasia   [] ?Temporary blindness   [] ?Dysphagia   [] ?Weakness or numbness in arms   [] ?Weakness or numbness in legs Musculoskeletal:  [x] ?Arthritis   [] ?Joint swelling   [] ?Joint pain   [] ?Low back pain Hematologic:  [] ?Easy bruising  [] ?Easy bleeding   [] ?Hypercoagulable state   [] ?Anemic   Gastrointestinal:  [] ?Blood in stool   [] ?Vomiting blood  [] ?Gastroesophageal reflux/heartburn   [] ?Abdominal pain Genitourinary:  [] ?Chronic kidney disease   [] ?Difficult urination  [] ?Frequent urination  [] ?Burning with urination    [] ?Hematuria Skin:  [] ?Rashes   [] ?Ulcers   [] ?Wounds Psychological:  [] ?History of anxiety   [] ? History of major depression.    Physical Examination  BP (!) 153/88    Pulse 84    Ht 5\' 8"  (1.727 m)    Wt 117 lb (53.1 kg)    BMI 17.79 kg/m  Gen:  WD/WN, NAD.  Thin but no longer cachectic Head: [place]/AT, No temporalis wasting. Ear/Nose/Throat: Hearing grossly intact, nares w/o erythema or drainage Eyes: Conjunctiva clear. Sclera non-icteric Neck: Supple.  Trachea midline Pulmonary:  Good air movement, no use of accessory muscles.  Cardiac: RRR, no JVD Vascular:  Vessel Right Left  Radial Palpable Palpable                          PT Palpable Palpable  DP Palpable Palpable    Musculoskeletal: M/S 5/5 throughout.  No deformity or atrophy.  No edema. Neurologic: Sensation grossly intact in extremities.  Symmetrical.  Speech is fluent.  Psychiatric: Judgment intact, Mood & affect appropriate for pt's clinical situation. Dermatologic: No rashes or ulcers noted.  No cellulitis or open wounds.       Labs No results found for this or any previous visit (from the past 2160 hour(s)).  Radiology No results found.  Assessment/Plan  Protein calorie malnutrition ([place]) Seems to be improving.  Much more functional.  Essential hypertension blood pressure control important in reducing the progression of atherosclerotic disease. On appropriate oral medications.   Atherosclerosis of native arteries of the extremities with gangrene ([place]) His ABIs today are 1.01 bilaterally with brisk triphasic waveforms and normal digital pressures bilaterally marked improvement after extensive revascularization almost a year ago.  We will do another 56-month checkup and if that looks good we can go to a once annual follow-up.  Continue current medical regimen for now.    Leotis Pain, MD  02/20/2020 3:30 PM    This note was created with Dragon medical transcription system.  Any errors from  dictation are purely unintentional

## 2020-02-20 NOTE — Assessment & Plan Note (Signed)
Seems to be improving.  Much more functional.

## 2020-02-20 NOTE — Assessment & Plan Note (Signed)
blood pressure control important in reducing the progression of atherosclerotic disease. On appropriate oral medications.  

## 2020-08-20 ENCOUNTER — Other Ambulatory Visit: Payer: Self-pay

## 2020-08-20 ENCOUNTER — Ambulatory Visit (INDEPENDENT_AMBULATORY_CARE_PROVIDER_SITE_OTHER): Payer: Medicaid Other

## 2020-08-20 ENCOUNTER — Ambulatory Visit (INDEPENDENT_AMBULATORY_CARE_PROVIDER_SITE_OTHER): Payer: Medicaid Other | Admitting: Vascular Surgery

## 2020-08-20 ENCOUNTER — Encounter (INDEPENDENT_AMBULATORY_CARE_PROVIDER_SITE_OTHER): Payer: Self-pay | Admitting: Vascular Surgery

## 2020-08-20 VITALS — BP 132/86 | HR 93 | Ht 68.0 in | Wt 108.0 lb

## 2020-08-20 DIAGNOSIS — I70261 Atherosclerosis of native arteries of extremities with gangrene, right leg: Secondary | ICD-10-CM

## 2020-08-20 DIAGNOSIS — E440 Moderate protein-calorie malnutrition: Secondary | ICD-10-CM

## 2020-08-20 DIAGNOSIS — I10 Essential (primary) hypertension: Secondary | ICD-10-CM

## 2020-08-20 NOTE — Assessment & Plan Note (Signed)
ABIs today are normal at 0.94 and 0.95 with brisk waveforms.  Clinically doing well.  Extend his follow-up to once a year.  Continue current medical regimen.

## 2020-08-20 NOTE — Progress Notes (Signed)
MRN : [id number]  Donald [name]. is a 59 y.o. ([birth year]) male who presents with chief complaint of  Chief Complaint  Patient presents with  . [name]   .  History of Present Illness: Patient returns today in follow up of his PAD.  He had extensive ischemia and underwent extensive revascularization about a year and a half ago.  He is still very thin and not entirely self-sufficient, but is far more functional than he was at the time of his revascularization he was bedbound and almost in the fetal position.  No new ulceration or infection.  Walks well without significant claudication at this time.  Noninvasive studies today showed normal flow in both lower extremities with normal ABIs and waveforms.  Current Outpatient Medications  Medication Sig Dispense Refill  . gabapentin (NEURONTIN) 100 MG capsule Take 100 mg by mouth daily.    Marland Kitchen gabapentin (NEURONTIN) 100 MG capsule Take by mouth.    Marland Kitchen acetaminophen (TYLENOL) 500 MG tablet Take 500 mg by mouth every 6 (six) hours as needed for moderate pain.     Marland Kitchen albuterol (VENTOLIN HFA) 108 (90 Base) MCG/ACT inhaler Inhale 2 puffs into the lungs every 6 (six) hours as needed for wheezing or shortness of breath. 8.5 g 2  . apixaban (ELIQUIS) 5 MG TABS tablet Take 1 tablet (5 mg total) by mouth 2 (two) times daily. 60 tablet   . apixaban (ELIQUIS) 5 MG TABS tablet Take by mouth.    Marland Kitchen ascorbic acid (VITAMIN C) 1000 MG tablet Take by mouth.    Marland Kitchen aspirin EC 81 MG EC tablet Take 1 tablet (81 mg total) by mouth daily.    Marland Kitchen atorvastatin (LIPITOR) 40 MG tablet Take 1 tablet (40 mg total) by mouth daily at 6 PM.    . atorvastatin (LIPITOR) 40 MG tablet Take by mouth.    . cholecalciferol (VITAMIN D) 25 MCG tablet Take 1 tablet (1,000 Units total) by mouth daily.    . cloNIDine (CATAPRES) 0.1 MG tablet Take 1 tablet (0.1 mg total) by mouth 2 (two) times daily. 60 tablet 0  . diltiazem (CARDIZEM CD) 240 MG 24 hr capsule Take 1 capsule (240 mg  total) by mouth daily.    Marland Kitchen diltiazem (TIAZAC) 240 MG 24 hr capsule Take by mouth.    . fluticasone (FLONASE) 50 MCG/ACT nasal spray Place into the nose.    Marland Kitchen Fluticasone-Salmeterol (ADVAIR) 250-50 MCG/DOSE AEPB Inhale 1 puff into the lungs 2 (two) times daily. 60 each 11  . folic acid (FOLVITE) 1 MG tablet Take 1 tablet (1 mg total) by mouth daily.    . Hydrocortisone (GERHARDT'S BUTT CREAM) CREA Apply 1 application topically 3 (three) times daily.    Marland Kitchen loperamide (IMODIUM A-D) 2 MG tablet Take 2 mg by mouth every 6 (six) hours as needed for diarrhea or loose stools.    . megestrol (MEGACE) 40 MG tablet Take 2-4 tablets (80-160 mg total) by mouth daily. Start with 2 tablets a day, then gradually increase as tolerated up to 4 per day 120 tablet 0  . Melatonin 1 MG/4ML LIQD Take by mouth.    . metoprolol succinate (TOPROL-XL) 50 MG 24 hr tablet Take 2 tablets (100 mg total) by mouth daily. 90 tablet 1  . metoprolol succinate (TOPROL-XL) 50 MG 24 hr tablet Take by mouth.    . mirtazapine (REMERON) 15 MG tablet Take 15 mg by mouth at bedtime.    Marland Kitchen  Multiple Vitamin (MULTIVITAMIN WITH MINERALS) TABS tablet Take 1 tablet by mouth daily.    . Nutritional Supplements (FEEDING SUPPLEMENT, NEPRO CARB STEADY,) LIQD Take 237 mLs by mouth 2 (two) times daily between meals. (Patient taking differently: Take 237 mLs by mouth 4 (four) times daily. )  0  . polyethylene glycol (MIRALAX / GLYCOLAX) 17 g packet Take 17 g by mouth daily. 14 each 0  . tamsulosin (FLOMAX) 0.4 MG CAPS capsule Take 1 capsule (0.4 mg total) by mouth daily after supper. 90 capsule 3  . tamsulosin (FLOMAX) 0.4 MG CAPS capsule Take by mouth.    . thiamine 100 MG tablet Take 1 tablet (100 mg total) by mouth daily.    . vitamin C (VITAMIN C) 250 MG tablet Take 1 tablet (250 mg total) by mouth 2 (two) times daily.     No current facility-administered medications for this visit.    Past Medical History:  Diagnosis Date  . Depression   .  Emphysema of lung ([place])   . Essential hypertension   . History of chicken pox   . Hyponatremia   . Insomnia   . Low weight   . MRSA nasal colonization   . Neuropathy    bilateral lower legs  . Peripheral vascular disease ([place])   . Pneumonia   . Prostate disease   . Seasonal allergies   . Seizures ([place])   . Sensory ataxia   . Sepsis ([place])   . Stroke ([place])   . Wears dentures    full upper    Past Surgical History:  Procedure Laterality Date  . ABDOMINAL AORTIC ENDOVASCULAR STENT GRAFT Bilateral 03/16/2019   Procedure: AORTIC ILIAC STENT;  Surgeon: Algernon Huxley, MD;  Location: [facility];  Service: Vascular;  Laterality: Bilateral;  . 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 CATARACT EXTRACTION W/PHACO Left 05/18/2018   Procedure: CATARACT EXTRACTION PHACO AND INTRAOCULAR LENS PLACEMENT ([place]) LEFT;  Surgeon: Leandrew Koyanagi, MD;  Location: [place];  Service: Ophthalmology;  Laterality: Left;  . COLONOSCOPY WITH PROPOFOL N/A 02/25/2016   Procedure: COLONOSCOPY WITH PROPOFOL;  Surgeon: [name], MD;  Location: [facility];  Service: Endoscopy;  Laterality: N/A;  . ENDARTERECTOMY FEMORAL Bilateral 03/16/2019   Procedure: ENDARTERECTOMY FEMORAL;  Surgeon: Algernon Huxley, MD;  Location: [facility];  Service: Vascular;  Laterality: Bilateral;  . EYE SURGERY  2015   Cataract  . FRACTURE SURGERY     ORIF wrist  . LOWER EXTREMITY ANGIOGRAPHY Left 03/13/2019   Procedure: Lower Extremity Angiography (LEFT);  Surgeon: Algernon Huxley, MD;  Location: [facility];  Service: Cardiovascular;  Laterality: Left;  Marland Kitchen VASCULAR SURGERY     "7 stents in legs"     Social History        Tobacco Use  . Smoking status: Former Smoker    Packs/day: 0.50    Years: 35.00    Pack years: 17.50    Types: Cigarettes    Quit date: 01/25/2018     Years since quitting: 1.5  . Smokeless tobacco: Former Systems developer    Quit date: 08/31/2016  . Tobacco comment: Previously quit in 09/2016 on Chantix until 09/2017  Substance Use Topics  . Alcohol use: Not Currently    Alcohol/week: 0.0 standard drinks    Comment: occ  . Drug use: No  Family History  Problem Relation Age of Onset  . Ulcers Father    bleeding  . Cancer Mother    Breast  No bleeding or clotting disorders  No Known Allergies   REVIEW OF SYSTEMS(Negative unless checked)  Constitutional: [] ??Weight loss [] ??Fever [] ??Chills Cardiac: [] ??Chest pain [] ??Chest pressure [] ??Palpitations [] ??Shortness of breath when laying flat [] ??Shortness of breath at rest [] ??Shortness of breath with exertion. Vascular: [] ??Pain in legs with walking [] ??Pain in legs at rest [] ??Pain in legs when laying flat [] ??Claudication [x] ??Pain in feet when walking [x] ??Pain in feet at rest [] ??Pain in feet when laying flat [] ??History of DVT [] ??Phlebitis [] ??Swelling in legs [] ??Varicose veins [] ??Non-healing ulcers Pulmonary: [] ??Uses home oxygen [] ??Productive cough [] ??Hemoptysis [] ??Wheeze [] ??COPD [] ??Asthma Neurologic: [] ??Dizziness [] ??Blackouts [] ??Seizures [] ??History of stroke [] ??History of TIA [] ??Aphasia [] ??Temporary blindness [] ??Dysphagia [] ??Weakness or numbness in arms [] ??Weakness or numbness in legs Musculoskeletal: [x] ??Arthritis [] ??Joint swelling [] ??Joint pain [] ??Low back pain Hematologic: [] ??Easy bruising [] ??Easy bleeding [] ??Hypercoagulable state [] ??Anemic  Gastrointestinal: [] ??Blood in stool [] ??Vomiting blood [] ??Gastroesophageal reflux/heartburn [] ??Abdominal pain Genitourinary: [] ??Chronic kidney disease [] ??Difficult urination [] ??Frequent urination [] ??Burning with urination [] ??Hematuria Skin: [] ??Rashes [] ??Ulcers [] ??Wounds Psychological:  [] ??History of anxiety [] ??History of major depression.     Physical Examination  BP 132/86   Pulse 93   Ht 5\' 8"  (1.727 m)   Wt 108 lb (49 kg)   BMI 16.42 kg/m  Gen:  Very thin, NAD Head: [place]/AT, + temporalis wasting. Ear/Nose/Throat: Hearing grossly intact, nares w/o erythema or drainage Eyes: Conjunctiva clear. Sclera non-icteric Neck: Supple.  Trachea midline Pulmonary:  Good air movement, no use of accessory muscles.  Cardiac: RRR, no JVD Vascular:  Vessel Right Left  Radial Palpable Palpable                          PT Palpable Palpable  DP Palpable Palpable   Gastrointestinal: soft, non-tender/non-distended. No guarding/reflex.  Musculoskeletal: M/S 5/5 throughout.  No deformity or atrophy. No edema. Neurologic: Sensation grossly intact in extremities.  Symmetrical.  Speech is fluent.  Psychiatric: Judgment intact, Mood & affect appropriate for pt's clinical situation. Dermatologic: No rashes or ulcers noted.  No cellulitis or open wounds.      Labs No results found for this or any previous visit (from the past 2160 hour(s)).  Radiology No results found.  Assessment/Plan Protein calorie malnutrition ([place]) Significant.  Much more functional though.  Essential hypertension blood pressure control important in reducing the progression of atherosclerotic disease. On appropriate oral medications.  Atherosclerosis of native arteries of the extremities with gangrene ([place]) ABIs today are normal at 0.94 and 0.95 with brisk waveforms.  Clinically doing well.  Extend his follow-up to once a year.  Continue current medical regimen.    Leotis Pain, MD  08/20/2020 2:55 PM    This note was created with Dragon medical transcription system.  Any errors from dictation are purely unintentional

## 2020-12-06 DIAGNOSIS — I6330 Cerebral infarction due to thrombosis of unspecified cerebral artery: Secondary | ICD-10-CM | POA: Diagnosis not present

## 2020-12-06 DIAGNOSIS — Z419 Encounter for procedure for purposes other than remedying health state, unspecified: Secondary | ICD-10-CM | POA: Diagnosis not present

## 2020-12-07 DIAGNOSIS — I6330 Cerebral infarction due to thrombosis of unspecified cerebral artery: Secondary | ICD-10-CM | POA: Diagnosis not present

## 2020-12-08 DIAGNOSIS — I6330 Cerebral infarction due to thrombosis of unspecified cerebral artery: Secondary | ICD-10-CM | POA: Diagnosis not present

## 2020-12-09 DIAGNOSIS — I6330 Cerebral infarction due to thrombosis of unspecified cerebral artery: Secondary | ICD-10-CM | POA: Diagnosis not present

## 2020-12-10 DIAGNOSIS — N400 Enlarged prostate without lower urinary tract symptoms: Secondary | ICD-10-CM | POA: Diagnosis not present

## 2020-12-10 DIAGNOSIS — G4700 Insomnia, unspecified: Secondary | ICD-10-CM | POA: Diagnosis not present

## 2020-12-10 DIAGNOSIS — R5381 Other malaise: Secondary | ICD-10-CM | POA: Diagnosis not present

## 2020-12-10 DIAGNOSIS — E46 Unspecified protein-calorie malnutrition: Secondary | ICD-10-CM | POA: Diagnosis not present

## 2020-12-10 DIAGNOSIS — I6330 Cerebral infarction due to thrombosis of unspecified cerebral artery: Secondary | ICD-10-CM | POA: Diagnosis not present

## 2020-12-10 DIAGNOSIS — I10 Essential (primary) hypertension: Secondary | ICD-10-CM | POA: Diagnosis not present

## 2020-12-10 DIAGNOSIS — E785 Hyperlipidemia, unspecified: Secondary | ICD-10-CM | POA: Diagnosis not present

## 2020-12-10 DIAGNOSIS — G629 Polyneuropathy, unspecified: Secondary | ICD-10-CM | POA: Diagnosis not present

## 2020-12-11 DIAGNOSIS — I6330 Cerebral infarction due to thrombosis of unspecified cerebral artery: Secondary | ICD-10-CM | POA: Diagnosis not present

## 2020-12-12 DIAGNOSIS — I6330 Cerebral infarction due to thrombosis of unspecified cerebral artery: Secondary | ICD-10-CM | POA: Diagnosis not present

## 2020-12-13 DIAGNOSIS — I6330 Cerebral infarction due to thrombosis of unspecified cerebral artery: Secondary | ICD-10-CM | POA: Diagnosis not present

## 2020-12-14 DIAGNOSIS — I6330 Cerebral infarction due to thrombosis of unspecified cerebral artery: Secondary | ICD-10-CM | POA: Diagnosis not present

## 2020-12-15 DIAGNOSIS — I6330 Cerebral infarction due to thrombosis of unspecified cerebral artery: Secondary | ICD-10-CM | POA: Diagnosis not present

## 2020-12-16 DIAGNOSIS — I6330 Cerebral infarction due to thrombosis of unspecified cerebral artery: Secondary | ICD-10-CM | POA: Diagnosis not present

## 2020-12-17 DIAGNOSIS — I6330 Cerebral infarction due to thrombosis of unspecified cerebral artery: Secondary | ICD-10-CM | POA: Diagnosis not present

## 2020-12-18 DIAGNOSIS — I6330 Cerebral infarction due to thrombosis of unspecified cerebral artery: Secondary | ICD-10-CM | POA: Diagnosis not present

## 2020-12-19 DIAGNOSIS — I6330 Cerebral infarction due to thrombosis of unspecified cerebral artery: Secondary | ICD-10-CM | POA: Diagnosis not present

## 2020-12-20 DIAGNOSIS — I6330 Cerebral infarction due to thrombosis of unspecified cerebral artery: Secondary | ICD-10-CM | POA: Diagnosis not present

## 2020-12-21 DIAGNOSIS — I6330 Cerebral infarction due to thrombosis of unspecified cerebral artery: Secondary | ICD-10-CM | POA: Diagnosis not present

## 2020-12-22 DIAGNOSIS — I6330 Cerebral infarction due to thrombosis of unspecified cerebral artery: Secondary | ICD-10-CM | POA: Diagnosis not present

## 2020-12-23 DIAGNOSIS — I6330 Cerebral infarction due to thrombosis of unspecified cerebral artery: Secondary | ICD-10-CM | POA: Diagnosis not present

## 2020-12-24 DIAGNOSIS — I6330 Cerebral infarction due to thrombosis of unspecified cerebral artery: Secondary | ICD-10-CM | POA: Diagnosis not present

## 2020-12-25 DIAGNOSIS — I6330 Cerebral infarction due to thrombosis of unspecified cerebral artery: Secondary | ICD-10-CM | POA: Diagnosis not present

## 2020-12-26 DIAGNOSIS — I6330 Cerebral infarction due to thrombosis of unspecified cerebral artery: Secondary | ICD-10-CM | POA: Diagnosis not present

## 2020-12-27 DIAGNOSIS — I6330 Cerebral infarction due to thrombosis of unspecified cerebral artery: Secondary | ICD-10-CM | POA: Diagnosis not present

## 2020-12-28 DIAGNOSIS — I6330 Cerebral infarction due to thrombosis of unspecified cerebral artery: Secondary | ICD-10-CM | POA: Diagnosis not present

## 2020-12-29 DIAGNOSIS — I6330 Cerebral infarction due to thrombosis of unspecified cerebral artery: Secondary | ICD-10-CM | POA: Diagnosis not present

## 2020-12-30 DIAGNOSIS — I6330 Cerebral infarction due to thrombosis of unspecified cerebral artery: Secondary | ICD-10-CM | POA: Diagnosis not present

## 2020-12-31 DIAGNOSIS — I6330 Cerebral infarction due to thrombosis of unspecified cerebral artery: Secondary | ICD-10-CM | POA: Diagnosis not present

## 2021-01-01 DIAGNOSIS — I6330 Cerebral infarction due to thrombosis of unspecified cerebral artery: Secondary | ICD-10-CM | POA: Diagnosis not present

## 2021-01-01 DIAGNOSIS — F419 Anxiety disorder, unspecified: Secondary | ICD-10-CM | POA: Diagnosis not present

## 2021-01-02 DIAGNOSIS — I6330 Cerebral infarction due to thrombosis of unspecified cerebral artery: Secondary | ICD-10-CM | POA: Diagnosis not present

## 2021-01-03 DIAGNOSIS — I6330 Cerebral infarction due to thrombosis of unspecified cerebral artery: Secondary | ICD-10-CM | POA: Diagnosis not present

## 2021-01-04 DIAGNOSIS — I6330 Cerebral infarction due to thrombosis of unspecified cerebral artery: Secondary | ICD-10-CM | POA: Diagnosis not present

## 2021-01-05 DIAGNOSIS — Z419 Encounter for procedure for purposes other than remedying health state, unspecified: Secondary | ICD-10-CM | POA: Diagnosis not present

## 2021-01-05 DIAGNOSIS — I6330 Cerebral infarction due to thrombosis of unspecified cerebral artery: Secondary | ICD-10-CM | POA: Diagnosis not present

## 2021-01-06 DIAGNOSIS — I6330 Cerebral infarction due to thrombosis of unspecified cerebral artery: Secondary | ICD-10-CM | POA: Diagnosis not present

## 2021-01-07 DIAGNOSIS — J439 Emphysema, unspecified: Secondary | ICD-10-CM | POA: Diagnosis not present

## 2021-01-07 DIAGNOSIS — I739 Peripheral vascular disease, unspecified: Secondary | ICD-10-CM | POA: Diagnosis not present

## 2021-01-07 DIAGNOSIS — I6330 Cerebral infarction due to thrombosis of unspecified cerebral artery: Secondary | ICD-10-CM | POA: Diagnosis not present

## 2021-01-07 DIAGNOSIS — E559 Vitamin D deficiency, unspecified: Secondary | ICD-10-CM | POA: Diagnosis not present

## 2021-01-07 DIAGNOSIS — R5381 Other malaise: Secondary | ICD-10-CM | POA: Diagnosis not present

## 2021-01-07 DIAGNOSIS — G8929 Other chronic pain: Secondary | ICD-10-CM | POA: Diagnosis not present

## 2021-01-07 DIAGNOSIS — K5900 Constipation, unspecified: Secondary | ICD-10-CM | POA: Diagnosis not present

## 2021-01-07 DIAGNOSIS — N400 Enlarged prostate without lower urinary tract symptoms: Secondary | ICD-10-CM | POA: Diagnosis not present

## 2021-01-07 DIAGNOSIS — I10 Essential (primary) hypertension: Secondary | ICD-10-CM | POA: Diagnosis not present

## 2021-01-07 DIAGNOSIS — G629 Polyneuropathy, unspecified: Secondary | ICD-10-CM | POA: Diagnosis not present

## 2021-01-07 DIAGNOSIS — G4700 Insomnia, unspecified: Secondary | ICD-10-CM | POA: Diagnosis not present

## 2021-01-08 DIAGNOSIS — I6330 Cerebral infarction due to thrombosis of unspecified cerebral artery: Secondary | ICD-10-CM | POA: Diagnosis not present

## 2021-01-09 DIAGNOSIS — I6330 Cerebral infarction due to thrombosis of unspecified cerebral artery: Secondary | ICD-10-CM | POA: Diagnosis not present

## 2021-01-10 DIAGNOSIS — I6330 Cerebral infarction due to thrombosis of unspecified cerebral artery: Secondary | ICD-10-CM | POA: Diagnosis not present

## 2021-01-11 DIAGNOSIS — I6330 Cerebral infarction due to thrombosis of unspecified cerebral artery: Secondary | ICD-10-CM | POA: Diagnosis not present

## 2021-01-12 DIAGNOSIS — I6330 Cerebral infarction due to thrombosis of unspecified cerebral artery: Secondary | ICD-10-CM | POA: Diagnosis not present

## 2021-01-13 DIAGNOSIS — I6330 Cerebral infarction due to thrombosis of unspecified cerebral artery: Secondary | ICD-10-CM | POA: Diagnosis not present

## 2021-01-14 DIAGNOSIS — I6330 Cerebral infarction due to thrombosis of unspecified cerebral artery: Secondary | ICD-10-CM | POA: Diagnosis not present

## 2021-01-15 DIAGNOSIS — I6330 Cerebral infarction due to thrombosis of unspecified cerebral artery: Secondary | ICD-10-CM | POA: Diagnosis not present

## 2021-01-16 DIAGNOSIS — I6330 Cerebral infarction due to thrombosis of unspecified cerebral artery: Secondary | ICD-10-CM | POA: Diagnosis not present

## 2021-01-17 DIAGNOSIS — I6330 Cerebral infarction due to thrombosis of unspecified cerebral artery: Secondary | ICD-10-CM | POA: Diagnosis not present

## 2021-01-18 DIAGNOSIS — I6330 Cerebral infarction due to thrombosis of unspecified cerebral artery: Secondary | ICD-10-CM | POA: Diagnosis not present

## 2021-01-19 DIAGNOSIS — I6330 Cerebral infarction due to thrombosis of unspecified cerebral artery: Secondary | ICD-10-CM | POA: Diagnosis not present

## 2021-01-20 DIAGNOSIS — I6330 Cerebral infarction due to thrombosis of unspecified cerebral artery: Secondary | ICD-10-CM | POA: Diagnosis not present

## 2021-01-21 DIAGNOSIS — I6330 Cerebral infarction due to thrombosis of unspecified cerebral artery: Secondary | ICD-10-CM | POA: Diagnosis not present

## 2021-01-22 DIAGNOSIS — I6330 Cerebral infarction due to thrombosis of unspecified cerebral artery: Secondary | ICD-10-CM | POA: Diagnosis not present

## 2021-01-23 DIAGNOSIS — I6330 Cerebral infarction due to thrombosis of unspecified cerebral artery: Secondary | ICD-10-CM | POA: Diagnosis not present

## 2021-01-24 DIAGNOSIS — I6330 Cerebral infarction due to thrombosis of unspecified cerebral artery: Secondary | ICD-10-CM | POA: Diagnosis not present

## 2021-01-25 DIAGNOSIS — I6330 Cerebral infarction due to thrombosis of unspecified cerebral artery: Secondary | ICD-10-CM | POA: Diagnosis not present

## 2021-01-26 DIAGNOSIS — I6330 Cerebral infarction due to thrombosis of unspecified cerebral artery: Secondary | ICD-10-CM | POA: Diagnosis not present

## 2021-01-27 DIAGNOSIS — I6330 Cerebral infarction due to thrombosis of unspecified cerebral artery: Secondary | ICD-10-CM | POA: Diagnosis not present

## 2021-01-28 DIAGNOSIS — I6330 Cerebral infarction due to thrombosis of unspecified cerebral artery: Secondary | ICD-10-CM | POA: Diagnosis not present

## 2021-01-29 DIAGNOSIS — I6330 Cerebral infarction due to thrombosis of unspecified cerebral artery: Secondary | ICD-10-CM | POA: Diagnosis not present

## 2021-01-30 DIAGNOSIS — I6330 Cerebral infarction due to thrombosis of unspecified cerebral artery: Secondary | ICD-10-CM | POA: Diagnosis not present

## 2021-01-31 DIAGNOSIS — I6330 Cerebral infarction due to thrombosis of unspecified cerebral artery: Secondary | ICD-10-CM | POA: Diagnosis not present

## 2021-02-01 DIAGNOSIS — I6330 Cerebral infarction due to thrombosis of unspecified cerebral artery: Secondary | ICD-10-CM | POA: Diagnosis not present

## 2021-02-02 DIAGNOSIS — I6330 Cerebral infarction due to thrombosis of unspecified cerebral artery: Secondary | ICD-10-CM | POA: Diagnosis not present

## 2021-02-03 DIAGNOSIS — I6330 Cerebral infarction due to thrombosis of unspecified cerebral artery: Secondary | ICD-10-CM | POA: Diagnosis not present

## 2021-02-04 DIAGNOSIS — I6330 Cerebral infarction due to thrombosis of unspecified cerebral artery: Secondary | ICD-10-CM | POA: Diagnosis not present

## 2021-02-05 DIAGNOSIS — I6330 Cerebral infarction due to thrombosis of unspecified cerebral artery: Secondary | ICD-10-CM | POA: Diagnosis not present

## 2021-02-05 DIAGNOSIS — Z419 Encounter for procedure for purposes other than remedying health state, unspecified: Secondary | ICD-10-CM | POA: Diagnosis not present

## 2021-02-06 DIAGNOSIS — I6330 Cerebral infarction due to thrombosis of unspecified cerebral artery: Secondary | ICD-10-CM | POA: Diagnosis not present

## 2021-02-07 DIAGNOSIS — I6330 Cerebral infarction due to thrombosis of unspecified cerebral artery: Secondary | ICD-10-CM | POA: Diagnosis not present

## 2021-02-08 DIAGNOSIS — I6330 Cerebral infarction due to thrombosis of unspecified cerebral artery: Secondary | ICD-10-CM | POA: Diagnosis not present

## 2021-02-09 DIAGNOSIS — I6330 Cerebral infarction due to thrombosis of unspecified cerebral artery: Secondary | ICD-10-CM | POA: Diagnosis not present

## 2021-02-10 DIAGNOSIS — I6330 Cerebral infarction due to thrombosis of unspecified cerebral artery: Secondary | ICD-10-CM | POA: Diagnosis not present

## 2021-02-11 DIAGNOSIS — R5381 Other malaise: Secondary | ICD-10-CM | POA: Diagnosis not present

## 2021-02-11 DIAGNOSIS — E785 Hyperlipidemia, unspecified: Secondary | ICD-10-CM | POA: Diagnosis not present

## 2021-02-11 DIAGNOSIS — I6330 Cerebral infarction due to thrombosis of unspecified cerebral artery: Secondary | ICD-10-CM | POA: Diagnosis not present

## 2021-02-11 DIAGNOSIS — K5900 Constipation, unspecified: Secondary | ICD-10-CM | POA: Diagnosis not present

## 2021-02-11 DIAGNOSIS — N400 Enlarged prostate without lower urinary tract symptoms: Secondary | ICD-10-CM | POA: Diagnosis not present

## 2021-02-11 DIAGNOSIS — I10 Essential (primary) hypertension: Secondary | ICD-10-CM | POA: Diagnosis not present

## 2021-02-11 DIAGNOSIS — E559 Vitamin D deficiency, unspecified: Secondary | ICD-10-CM | POA: Diagnosis not present

## 2021-02-11 DIAGNOSIS — I739 Peripheral vascular disease, unspecified: Secondary | ICD-10-CM | POA: Diagnosis not present

## 2021-02-11 DIAGNOSIS — E46 Unspecified protein-calorie malnutrition: Secondary | ICD-10-CM | POA: Diagnosis not present

## 2021-02-12 DIAGNOSIS — F419 Anxiety disorder, unspecified: Secondary | ICD-10-CM | POA: Diagnosis not present

## 2021-02-12 DIAGNOSIS — I6330 Cerebral infarction due to thrombosis of unspecified cerebral artery: Secondary | ICD-10-CM | POA: Diagnosis not present

## 2021-02-13 DIAGNOSIS — I6330 Cerebral infarction due to thrombosis of unspecified cerebral artery: Secondary | ICD-10-CM | POA: Diagnosis not present

## 2021-02-14 DIAGNOSIS — I6330 Cerebral infarction due to thrombosis of unspecified cerebral artery: Secondary | ICD-10-CM | POA: Diagnosis not present

## 2021-02-15 DIAGNOSIS — I6330 Cerebral infarction due to thrombosis of unspecified cerebral artery: Secondary | ICD-10-CM | POA: Diagnosis not present

## 2021-02-16 DIAGNOSIS — I6330 Cerebral infarction due to thrombosis of unspecified cerebral artery: Secondary | ICD-10-CM | POA: Diagnosis not present

## 2021-02-17 DIAGNOSIS — I6330 Cerebral infarction due to thrombosis of unspecified cerebral artery: Secondary | ICD-10-CM | POA: Diagnosis not present

## 2021-02-18 DIAGNOSIS — I6330 Cerebral infarction due to thrombosis of unspecified cerebral artery: Secondary | ICD-10-CM | POA: Diagnosis not present

## 2021-02-19 DIAGNOSIS — I6330 Cerebral infarction due to thrombosis of unspecified cerebral artery: Secondary | ICD-10-CM | POA: Diagnosis not present

## 2021-02-20 DIAGNOSIS — I6330 Cerebral infarction due to thrombosis of unspecified cerebral artery: Secondary | ICD-10-CM | POA: Diagnosis not present

## 2021-02-21 DIAGNOSIS — I6330 Cerebral infarction due to thrombosis of unspecified cerebral artery: Secondary | ICD-10-CM | POA: Diagnosis not present

## 2021-02-22 DIAGNOSIS — I6330 Cerebral infarction due to thrombosis of unspecified cerebral artery: Secondary | ICD-10-CM | POA: Diagnosis not present

## 2021-02-23 DIAGNOSIS — I6330 Cerebral infarction due to thrombosis of unspecified cerebral artery: Secondary | ICD-10-CM | POA: Diagnosis not present

## 2021-02-24 DIAGNOSIS — I6330 Cerebral infarction due to thrombosis of unspecified cerebral artery: Secondary | ICD-10-CM | POA: Diagnosis not present

## 2021-02-25 DIAGNOSIS — I6330 Cerebral infarction due to thrombosis of unspecified cerebral artery: Secondary | ICD-10-CM | POA: Diagnosis not present

## 2021-02-26 DIAGNOSIS — I6330 Cerebral infarction due to thrombosis of unspecified cerebral artery: Secondary | ICD-10-CM | POA: Diagnosis not present

## 2021-02-27 DIAGNOSIS — I6330 Cerebral infarction due to thrombosis of unspecified cerebral artery: Secondary | ICD-10-CM | POA: Diagnosis not present

## 2021-02-28 DIAGNOSIS — I6330 Cerebral infarction due to thrombosis of unspecified cerebral artery: Secondary | ICD-10-CM | POA: Diagnosis not present

## 2021-03-01 DIAGNOSIS — I6330 Cerebral infarction due to thrombosis of unspecified cerebral artery: Secondary | ICD-10-CM | POA: Diagnosis not present

## 2021-03-02 DIAGNOSIS — I6330 Cerebral infarction due to thrombosis of unspecified cerebral artery: Secondary | ICD-10-CM | POA: Diagnosis not present

## 2021-03-03 DIAGNOSIS — I6330 Cerebral infarction due to thrombosis of unspecified cerebral artery: Secondary | ICD-10-CM | POA: Diagnosis not present

## 2021-03-04 DIAGNOSIS — I6330 Cerebral infarction due to thrombosis of unspecified cerebral artery: Secondary | ICD-10-CM | POA: Diagnosis not present

## 2021-03-05 DIAGNOSIS — I6330 Cerebral infarction due to thrombosis of unspecified cerebral artery: Secondary | ICD-10-CM | POA: Diagnosis not present

## 2021-03-06 DIAGNOSIS — I10 Essential (primary) hypertension: Secondary | ICD-10-CM | POA: Diagnosis not present

## 2021-03-06 DIAGNOSIS — I6330 Cerebral infarction due to thrombosis of unspecified cerebral artery: Secondary | ICD-10-CM | POA: Diagnosis not present

## 2021-03-07 DIAGNOSIS — Z419 Encounter for procedure for purposes other than remedying health state, unspecified: Secondary | ICD-10-CM | POA: Diagnosis not present

## 2021-03-07 DIAGNOSIS — I6330 Cerebral infarction due to thrombosis of unspecified cerebral artery: Secondary | ICD-10-CM | POA: Diagnosis not present

## 2021-03-08 DIAGNOSIS — I6330 Cerebral infarction due to thrombosis of unspecified cerebral artery: Secondary | ICD-10-CM | POA: Diagnosis not present

## 2021-03-09 DIAGNOSIS — I6330 Cerebral infarction due to thrombosis of unspecified cerebral artery: Secondary | ICD-10-CM | POA: Diagnosis not present

## 2021-03-10 DIAGNOSIS — I6330 Cerebral infarction due to thrombosis of unspecified cerebral artery: Secondary | ICD-10-CM | POA: Diagnosis not present

## 2021-03-11 DIAGNOSIS — I6330 Cerebral infarction due to thrombosis of unspecified cerebral artery: Secondary | ICD-10-CM | POA: Diagnosis not present

## 2021-03-12 DIAGNOSIS — I6330 Cerebral infarction due to thrombosis of unspecified cerebral artery: Secondary | ICD-10-CM | POA: Diagnosis not present

## 2021-03-13 DIAGNOSIS — I6330 Cerebral infarction due to thrombosis of unspecified cerebral artery: Secondary | ICD-10-CM | POA: Diagnosis not present

## 2021-03-14 DIAGNOSIS — I6330 Cerebral infarction due to thrombosis of unspecified cerebral artery: Secondary | ICD-10-CM | POA: Diagnosis not present

## 2021-03-14 DIAGNOSIS — E785 Hyperlipidemia, unspecified: Secondary | ICD-10-CM | POA: Diagnosis not present

## 2021-03-14 DIAGNOSIS — G629 Polyneuropathy, unspecified: Secondary | ICD-10-CM | POA: Diagnosis not present

## 2021-03-14 DIAGNOSIS — E559 Vitamin D deficiency, unspecified: Secondary | ICD-10-CM | POA: Diagnosis not present

## 2021-03-14 DIAGNOSIS — I739 Peripheral vascular disease, unspecified: Secondary | ICD-10-CM | POA: Diagnosis not present

## 2021-03-14 DIAGNOSIS — K5900 Constipation, unspecified: Secondary | ICD-10-CM | POA: Diagnosis not present

## 2021-03-14 DIAGNOSIS — R5381 Other malaise: Secondary | ICD-10-CM | POA: Diagnosis not present

## 2021-03-14 DIAGNOSIS — J439 Emphysema, unspecified: Secondary | ICD-10-CM | POA: Diagnosis not present

## 2021-03-14 DIAGNOSIS — N400 Enlarged prostate without lower urinary tract symptoms: Secondary | ICD-10-CM | POA: Diagnosis not present

## 2021-03-14 DIAGNOSIS — E46 Unspecified protein-calorie malnutrition: Secondary | ICD-10-CM | POA: Diagnosis not present

## 2021-03-15 DIAGNOSIS — I6330 Cerebral infarction due to thrombosis of unspecified cerebral artery: Secondary | ICD-10-CM | POA: Diagnosis not present

## 2021-03-16 DIAGNOSIS — I6330 Cerebral infarction due to thrombosis of unspecified cerebral artery: Secondary | ICD-10-CM | POA: Diagnosis not present

## 2021-03-17 DIAGNOSIS — I6330 Cerebral infarction due to thrombosis of unspecified cerebral artery: Secondary | ICD-10-CM | POA: Diagnosis not present

## 2021-03-18 DIAGNOSIS — I6330 Cerebral infarction due to thrombosis of unspecified cerebral artery: Secondary | ICD-10-CM | POA: Diagnosis not present

## 2021-03-19 DIAGNOSIS — I6330 Cerebral infarction due to thrombosis of unspecified cerebral artery: Secondary | ICD-10-CM | POA: Diagnosis not present

## 2021-03-20 DIAGNOSIS — I6330 Cerebral infarction due to thrombosis of unspecified cerebral artery: Secondary | ICD-10-CM | POA: Diagnosis not present

## 2021-03-21 DIAGNOSIS — I6330 Cerebral infarction due to thrombosis of unspecified cerebral artery: Secondary | ICD-10-CM | POA: Diagnosis not present

## 2021-03-22 DIAGNOSIS — I6330 Cerebral infarction due to thrombosis of unspecified cerebral artery: Secondary | ICD-10-CM | POA: Diagnosis not present

## 2021-03-23 ENCOUNTER — Emergency Department
Admission: EM | Admit: 2021-03-23 | Discharge: 2021-03-23 | Disposition: A | Discharge status: Home / Self Care | Payer: Medicaid Other | Attending: Emergency Medicine | Admitting: Emergency Medicine

## 2021-03-23 ENCOUNTER — Other Ambulatory Visit: Payer: Self-pay

## 2021-03-23 DIAGNOSIS — Z7901 Long term (current) use of anticoagulants: Secondary | ICD-10-CM | POA: Insufficient documentation

## 2021-03-23 DIAGNOSIS — K068 Other specified disorders of gingiva and edentulous alveolar ridge: Secondary | ICD-10-CM | POA: Insufficient documentation

## 2021-03-23 DIAGNOSIS — I10 Essential (primary) hypertension: Secondary | ICD-10-CM | POA: Diagnosis not present

## 2021-03-23 DIAGNOSIS — Z79899 Other long term (current) drug therapy: Secondary | ICD-10-CM | POA: Diagnosis not present

## 2021-03-23 DIAGNOSIS — Z87891 Personal history of nicotine dependence: Secondary | ICD-10-CM | POA: Insufficient documentation

## 2021-03-23 DIAGNOSIS — I6330 Cerebral infarction due to thrombosis of unspecified cerebral artery: Secondary | ICD-10-CM | POA: Diagnosis not present

## 2021-03-23 DIAGNOSIS — R58 Hemorrhage, not elsewhere classified: Secondary | ICD-10-CM | POA: Diagnosis not present

## 2021-03-23 DIAGNOSIS — R000 Tachycardia, unspecified: Secondary | ICD-10-CM | POA: Diagnosis not present

## 2021-03-23 LAB — CBC
HCT: 34.1 % — ABNORMAL LOW (ref 39.0–52.0)
Hemoglobin: 12.2 g/dL — ABNORMAL LOW (ref 13.0–17.0)
MCH: 34.6 pg — ABNORMAL HIGH (ref 26.0–34.0)
MCHC: 35.8 g/dL (ref 30.0–36.0)
MCV: 96.6 fL (ref 80.0–100.0)
Platelets: 196 10*3/uL (ref 150–400)
RBC: 3.53 MIL/uL — ABNORMAL LOW (ref 4.22–5.81)
RDW: 13.1 % (ref 11.5–15.5)
WBC: 18.1 10*3/uL — ABNORMAL HIGH (ref 4.0–10.5)
nRBC: 0 % (ref 0.0–0.2)

## 2021-03-23 LAB — COMPREHENSIVE METABOLIC PANEL
ALT: 22 U/L (ref 0–44)
AST: 20 U/L (ref 15–41)
Albumin: 3.8 g/dL (ref 3.5–5.0)
Alkaline Phosphatase: 57 U/L (ref 38–126)
Anion gap: 10 (ref 5–15)
BUN: 53 mg/dL — ABNORMAL HIGH (ref 6–20)
CO2: 24 mmol/L (ref 22–32)
Calcium: 9.5 mg/dL (ref 8.9–10.3)
Chloride: 105 mmol/L (ref 98–111)
Creatinine, Ser: 0.89 mg/dL (ref 0.61–1.24)
GFR, Estimated: >60 mL/min (ref >60)
Glucose, Bld: 124 mg/dL — ABNORMAL HIGH (ref 70–99)
Potassium: 3.8 mmol/L (ref 3.5–5.1)
Sodium: 139 mmol/L (ref 135–145)
Total Bilirubin: 0.8 mg/dL (ref 0.3–1.2)
Total Protein: 7.1 g/dL (ref 6.5–8.1)

## 2021-03-23 LAB — PROTIME-INR
INR: 1.3 — ABNORMAL HIGH (ref 0.8–1.2)
Prothrombin Time: 16.1 seconds — ABNORMAL HIGH (ref 11.4–15.2)

## 2021-03-23 MED ORDER — OXYCODONE HCL 5 MG PO TABS
10.0000 mg | ORAL_TABLET | Freq: Once | ORAL | Status: AC
  Administered 2021-03-23: 10 mg via ORAL
  Filled 2021-03-23: qty 2

## 2021-03-23 MED ORDER — TRANEXAMIC ACID-NACL 1000-0.7 MG/100ML-% IV SOLN
1000.0000 mg | Freq: Once | INTRAVENOUS | Status: AC
  Administered 2021-03-23: 1000 mg via INTRAVENOUS
  Filled 2021-03-23: qty 100

## 2021-03-23 NOTE — ED Notes (Signed)
Pts bleeding has stopped. Mother is at bedside. VSS. NAD. Call light within reach. Will continue to monitor.

## 2021-03-23 NOTE — ED Provider Notes (Signed)
[facility] Emergency Department Provider Note ____________________________________________   Event Date/Time   First MD Initiated Contact with Patient 03/23/21 1109     (approximate)  I have reviewed the triage vital signs and the nursing notes.  HISTORY  Chief Complaint Post-op Problem   HPI Donald Wilford. is a 60 y.o. malewho presents to the ED for evaluation of post op pain and bleeding.   Chart review indicates hx PAD prescribed Eliquis.  Patient reports he electively had mandibular 9 teeth removed last week in preparation for getting dentures.  He reports that he has not been taking his Eliquis since before the procedure, and has not resumed his Eliquis yet.  Patient reports no fevers and that his pain has been well controlled, but reports concern that he began bleeding profusely from his mandibular wounds over the past 12 hours.  He reports poorly controlled bleeding despite trying to apply some direct pressure.  He reports passage of small clots and having to spit frequently.   Denies syncope, falls or additional trauma.  Past Medical History:  Diagnosis Date   Depression    Emphysema of lung ([place])    Essential hypertension    History of chicken pox    Hyponatremia    Insomnia    Low weight    MRSA nasal colonization    Neuropathy    bilateral lower legs   Peripheral vascular disease (HCC)    Pneumonia    Prostate disease    Seasonal allergies    Seizures ([place])    Sensory ataxia    Sepsis ([place])    Stroke [facility])    Wears dentures    full upper    Patient Active Problem List   Diagnosis Date Noted   Atherosclerosis of native arteries of the extremities with gangrene ([place]) 05/16/2019   Acute CVA (cerebrovascular accident) ([place])    Healthcare-associated pneumonia    Goals of care, counseling/discussion    Palliative care by specialist    DNR (do not resuscitate) discussion    Sepsis ([place]) 03/13/2019   Protein-calorie  malnutrition, severe 03/06/2019   Pressure injury of skin 03/05/2019   DTs (delirium tremens) ([place]) 03/04/2019   Protein calorie malnutrition ([place]) 04/12/2018   Insomnia 04/12/2018   Low weight 07/06/2017   Environmental and seasonal allergies 12/29/2016   Special screening for malignant neoplasms, colon    Benign neoplasm of cecum    Benign neoplasm of transverse colon    Benign neoplasm of sigmoid colon    Alcohol dependence, in remission ([place]) 01/20/2016   Mononeuropathy of both lower extremities 01/20/2016   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   Hyponatremia 11/14/2015   Peripheral neuropathy 07/29/2015   Essential hypertension 06/20/2015   Benign prostatic hyperplasia 06/20/2015   PAD (peripheral artery disease) ([place]) 06/20/2015   Former smoker 06/20/2015   Centrilobular emphysema ([place]) 06/20/2015    Past Surgical History:  Procedure Laterality Date   ABDOMINAL AORTIC ENDOVASCULAR STENT GRAFT Bilateral 03/16/2019   Procedure: AORTIC ILIAC STENT;  Surgeon: Algernon Huxley, MD;  Location: [facility] OR[facility];  Service: Vascular;  Laterality: Bilateral;   AMPUTATION TOE Right 06/16/2019   Procedure: 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;  Surgeon: Samara Deist, DPM;  Location: [facility];  Service: Podiatry;  Laterality: Right;   CATARACT EXTRACTION W/PHACO Left 05/18/2018   Procedure: CATARACT EXTRACTION PHACO AND INTRAOCULAR LENS PLACEMENT ([place]) LEFT;  Surgeon: Leandrew Koyanagi, MD;  Location: [place];  Service: Ophthalmology;  Laterality: Left;   COLONOSCOPY  WITH PROPOFOL N/A 02/25/2016   Procedure: COLONOSCOPY WITH PROPOFOL;  Surgeon: [name], MD;  Location: [facility];  Service: Endoscopy;  Laterality: N/A;   ENDARTERECTOMY FEMORAL Bilateral 03/16/2019   Procedure: ENDARTERECTOMY FEMORAL;  Surgeon: Algernon Huxley, MD;  Location: [facility];  Service: Vascular;  Laterality: Bilateral;   EYE SURGERY  2015   Cataract   FRACTURE SURGERY     ORIF wrist   LOWER EXTREMITY ANGIOGRAPHY Left 03/13/2019    Procedure: Lower Extremity Angiography (LEFT);  Surgeon: Algernon Huxley, MD;  Location: [place] [facility];  Service: Cardiovascular;  Laterality: Left;   VASCULAR SURGERY     "7 stents in legs"    Prior to Admission medications   Medication Sig Start Date End Date Taking? Authorizing Provider  acetaminophen (TYLENOL) 500 MG tablet Take 500 mg by mouth every 6 (six) hours as needed for moderate pain.     [provider]  albuterol (VENTOLIN HFA) 108 (90 Base) MCG/ACT inhaler Inhale 2 puffs into the lungs every 6 (six) hours as needed for wheezing or shortness of breath. 02/15/19   Karamalegos, Devonne Doughty, DO  apixaban (ELIQUIS) 5 MG TABS tablet Take 1 tablet (5 mg total) by mouth 2 (two) times daily. 03/24/19   Nicholes Mango, MD  apixaban (ELIQUIS) 5 MG TABS tablet Take by mouth. 03/24/19   [provider]  ascorbic acid (VITAMIN C) 1000 MG tablet Take by mouth.    [provider]  aspirin EC 81 MG EC tablet Take 1 tablet (81 mg total) by mouth daily. 03/25/19   Nicholes Mango, MD  atorvastatin (LIPITOR) 40 MG tablet Take 1 tablet (40 mg total) by mouth daily at 6 PM. 03/24/19   Gouru, Aruna, MD  atorvastatin (LIPITOR) 40 MG tablet Take by mouth.    [provider]  cholecalciferol (VITAMIN D) 25 MCG tablet Take 1 tablet (1,000 Units total) by mouth daily. 03/24/19   Nicholes Mango, MD  cloNIDine (CATAPRES) 0.1 MG tablet Take 1 tablet (0.1 mg total) by mouth 2 (two) times daily. 03/08/19   Stark Jock Jude, MD  diltiazem (CARDIZEM CD) 240 MG 24 hr capsule Take 1 capsule (240 mg total) by mouth daily. 03/25/19   Gouru, Illene Silver, MD  diltiazem (TIAZAC) 240 MG 24 hr capsule Take by mouth.    [provider]  fluticasone (FLONASE) 50 MCG/ACT nasal spray Place into the nose.    [provider]  Fluticasone-Salmeterol (ADVAIR) 250-50 MCG/DOSE AEPB Inhale 1 puff into the lungs 2 (two) times daily. 07/06/17   Karamalegos, Devonne Doughty, DO  folic acid (FOLVITE) 1 MG  tablet Take 1 tablet (1 mg total) by mouth daily. 03/25/19   Gouru, Illene Silver, MD  gabapentin (NEURONTIN) 100 MG capsule Take 100 mg by mouth daily.    [provider]  gabapentin (NEURONTIN) 100 MG capsule Take by mouth.    [provider]  Hydrocortisone (GERHARDT'S BUTT CREAM) CREA Apply 1 application topically 3 (three) times daily. 03/24/19   Nicholes Mango, MD  loperamide (IMODIUM A-D) 2 MG tablet Take 2 mg by mouth every 6 (six) hours as needed for diarrhea or loose stools.    [provider]  megestrol (MEGACE) 40 MG tablet Take 2-4 tablets (80-160 mg total) by mouth daily. Start with 2 tablets a day, then gradually increase as tolerated up to 4 per day 01/12/19   Olin Hauser, DO  Melatonin 1 MG/4ML LIQD Take by mouth.    [provider]  metoprolol  succinate (TOPROL-XL) 50 MG 24 hr tablet Take 2 tablets (100 mg total) by mouth daily. 03/24/19   Gouru, Illene Silver, MD  metoprolol succinate (TOPROL-XL) 50 MG 24 hr tablet Take by mouth.    [provider]  mirtazapine (REMERON) 15 MG tablet Take 15 mg by mouth at bedtime.    [provider]  Multiple Vitamin (MULTIVITAMIN WITH MINERALS) TABS tablet Take 1 tablet by mouth daily. 03/25/19   Nicholes Mango, MD  Nutritional Supplements (FEEDING SUPPLEMENT, NEPRO CARB STEADY,) LIQD Take 237 mLs by mouth 2 (two) times daily between meals. Patient taking differently: Take 237 mLs by mouth 4 (four) times daily. 03/24/19   Gouru, Illene Silver, MD  polyethylene glycol (MIRALAX / GLYCOLAX) 17 g packet Take 17 g by mouth daily. 03/25/19   Nicholes Mango, MD  tamsulosin (FLOMAX) 0.4 MG CAPS capsule Take 1 capsule (0.4 mg total) by mouth daily after supper. 01/12/19   Karamalegos, Devonne Doughty, DO  tamsulosin (FLOMAX) 0.4 MG CAPS capsule Take by mouth.    [provider]  thiamine 100 MG tablet Take 1 tablet (100 mg total) by mouth daily. 03/25/19   Nicholes Mango, MD  vitamin C (VITAMIN C) 250 MG tablet Take 1 tablet  (250 mg total) by mouth 2 (two) times daily. 03/24/19   Nicholes Mango, MD    Allergies Patient has no known allergies.  Family History  Problem Relation Age of Onset   Ulcers Father        bleeding   Cancer Mother        Breast    Social History Social History   Tobacco Use   Smoking status: Former    Packs/day: 0.50    Years: 35.00    Pack years: 17.50    Types: Cigarettes    Quit date: 01/25/2018    Years since quitting: 3.1   Smokeless tobacco: Former    Quit date: 08/31/2016   Tobacco comments:    Previously quit in 09/2016 on Chantix until 09/2017  Vaping Use   Vaping Use: Never used  Substance Use Topics   Alcohol use: Not Currently    Alcohol/week: 0.0 standard drinks    Comment: occ   Drug use: No    Review of Systems  Constitutional: No fever/chills Eyes: No visual changes. ENT: No sore throat.  Positive for bleeding gums postoperatively Cardiovascular: Denies chest pain. Respiratory: Denies shortness of breath. Gastrointestinal: No abdominal pain.  No nausea, no vomiting.  No diarrhea.  No constipation. Genitourinary: Negative for dysuria. Musculoskeletal: Negative for back pain. Skin: Negative for rash. Neurological: Negative for headaches, focal weakness or numbness.  ____________________________________________   PHYSICAL EXAM:  VITAL SIGNS: Vitals:   03/23/21 1256 03/23/21 1423  BP: 136/67 129/74  Pulse: (!) 109 (!) 102  Resp: 16 16  Temp:    SpO2: 98% 99%      Constitutional: Alert and oriented.  Sitting upright in bed without distress.  Emesis bag between his legs with small volume bright red blood and blood clots noted Eyes: Conjunctivae are normal. PERRL. EOMI. Head: Atraumatic. Nose: No congestion/rhinnorhea. Mouth/Throat: Mucous membranes are moist.  Patient is completely edentulous.  No evidence of maxillary trauma or bleeding in the posterior oropharynx, tongue or maxillary gumline.  Healing surgical wounds to his mandibular  gumline from recent tooth extraction.  I see no gaping wounds to necessitate suture repair.  Oozing from multiple sites, primarily anteriorly around tooth 26 and 25 Neck: No stridor. No cervical spine tenderness to palpation.  Cardiovascular: Tachycardic rate, regular rhythm. Grossly normal heart sounds.  Good peripheral circulation. Respiratory: Normal respiratory effort.  No retractions. Lungs CTAB. Gastrointestinal: Soft , nondistended, nontender to palpation. No CVA tenderness. Musculoskeletal: No lower extremity tenderness nor edema.  No joint effusions. No signs of acute trauma. Neurologic:  Normal speech and language. No gross focal neurologic deficits are appreciated. No gait instability noted. Skin:  Skin is warm, dry and intact. No rash noted. Psychiatric: Mood and affect are normal. Speech and behavior are normal.  ____________________________________________   LABS (all labs ordered are listed, but only abnormal results are displayed)  Labs Reviewed  CBC - Abnormal; Notable for the following components:      Result Value   WBC 18.1 (*)    RBC 3.53 (*)    Hemoglobin 12.2 (*)    HCT 34.1 (*)    MCH 34.6 (*)    All other components within normal limits  COMPREHENSIVE METABOLIC PANEL - Abnormal; Notable for the following components:   Glucose, Bld 124 (*)    BUN 53 (*)    All other components within normal limits  PROTIME-INR - Abnormal; Notable for the following components:   Prothrombin Time 16.1 (*)    INR 1.3 (*)    All other components within normal limits   ____________________________________________  12 Lead EKG   ____________________________________________  RADIOLOGY  ED MD interpretation:    Official radiology report(s): No results found.  ____________________________________________   PROCEDURES and INTERVENTIONS  Procedure(s) performed (including Critical Care):  Procedures  Medications  tranexamic acid (CYKLOKAPRON) IVPB 1,000 mg (0 mg  Intravenous Stopped 03/23/21 1232)  oxyCODONE (Oxy IR/ROXICODONE) immediate release tablet 10 mg (10 mg Oral Given 03/23/21 1255)    ____________________________________________   MDM / ED COURSE   60 year old male previously on Eliquis due to PAD presents to the ED with postoperative bleeding gums after a recent tooth extraction, with hemostasis achieved with direct pressure and single dose of IV TXA, ultimately amenable to outpatient management.  Who presents with continuously oozing mandibular gumline from his tooth extraction.  No signs of periodontal abscess or acute infectious etiology.  No signs of acute trauma.  No wounds or signs of bleeding beyond his mandibular gumline from recent extraction.  Hemoglobin without significant drop compared to previous.  Initially started with teabags and direct pressure with some slowing of his bleeding, but no hemostasis.  Therefore progressed to IV TXA and had good hemostasis after this.  He was observed for greater than an hour after this without rebleeding.  I discussed with him the importance of not dislodging delicate clots and we discussed following up with his OMFS, we discussed return precautions for the ED.  Clinical Course as of 03/23/21 1452  Sun Mar 23, 2021  1121 Left patient with teabag between gum lines, suction set up [DS]  1143 Reassessed. Mom at the bedside. Clinically similar. Still oozing [DS]  8366 Reassessed.  Bleeding has stopped and teabag is now out of his gumline.  TXA has finished.  I discussed with patient the importance of not irritating his gumline due to delicate clots [DS]    Clinical Course User Index [DS] Vladimir Crofts, MD    ____________________________________________   FINAL CLINICAL IMPRESSION(S) / ED DIAGNOSES  Final diagnoses:  Gums, bleeding     ED Discharge Orders     None        Ginelle Bays Tamala Julian   Note:  This document was prepared using Dragon voice recognition software and may  include  unintentional dictation errors.    Vladimir Crofts, MD 03/23/21 [phone]

## 2021-03-23 NOTE — ED Triage Notes (Addendum)
Pt comes into the ED via EMS from [place] with c/o states he had oral surgery last week and last night the stitches came loss and has been having bleeding since. Pt states he had 9 teeth removed. States he is on a blood thinner but does not know the name of it.

## 2021-03-24 DIAGNOSIS — F419 Anxiety disorder, unspecified: Secondary | ICD-10-CM | POA: Diagnosis not present

## 2021-03-24 DIAGNOSIS — I6330 Cerebral infarction due to thrombosis of unspecified cerebral artery: Secondary | ICD-10-CM | POA: Diagnosis not present

## 2021-03-25 DIAGNOSIS — I6330 Cerebral infarction due to thrombosis of unspecified cerebral artery: Secondary | ICD-10-CM | POA: Diagnosis not present

## 2021-03-26 DIAGNOSIS — I6330 Cerebral infarction due to thrombosis of unspecified cerebral artery: Secondary | ICD-10-CM | POA: Diagnosis not present

## 2021-03-27 DIAGNOSIS — I6330 Cerebral infarction due to thrombosis of unspecified cerebral artery: Secondary | ICD-10-CM | POA: Diagnosis not present

## 2021-03-28 DIAGNOSIS — I6330 Cerebral infarction due to thrombosis of unspecified cerebral artery: Secondary | ICD-10-CM | POA: Diagnosis not present

## 2021-03-29 DIAGNOSIS — I6330 Cerebral infarction due to thrombosis of unspecified cerebral artery: Secondary | ICD-10-CM | POA: Diagnosis not present

## 2021-03-30 DIAGNOSIS — I6330 Cerebral infarction due to thrombosis of unspecified cerebral artery: Secondary | ICD-10-CM | POA: Diagnosis not present

## 2021-03-31 DIAGNOSIS — I6330 Cerebral infarction due to thrombosis of unspecified cerebral artery: Secondary | ICD-10-CM | POA: Diagnosis not present

## 2021-04-01 DIAGNOSIS — I6330 Cerebral infarction due to thrombosis of unspecified cerebral artery: Secondary | ICD-10-CM | POA: Diagnosis not present

## 2021-04-02 DIAGNOSIS — I6330 Cerebral infarction due to thrombosis of unspecified cerebral artery: Secondary | ICD-10-CM | POA: Diagnosis not present

## 2021-04-03 DIAGNOSIS — I6330 Cerebral infarction due to thrombosis of unspecified cerebral artery: Secondary | ICD-10-CM | POA: Diagnosis not present

## 2021-04-04 DIAGNOSIS — I6330 Cerebral infarction due to thrombosis of unspecified cerebral artery: Secondary | ICD-10-CM | POA: Diagnosis not present

## 2021-04-05 DIAGNOSIS — I6330 Cerebral infarction due to thrombosis of unspecified cerebral artery: Secondary | ICD-10-CM | POA: Diagnosis not present

## 2021-04-06 DIAGNOSIS — I6330 Cerebral infarction due to thrombosis of unspecified cerebral artery: Secondary | ICD-10-CM | POA: Diagnosis not present

## 2021-04-07 DIAGNOSIS — Z419 Encounter for procedure for purposes other than remedying health state, unspecified: Secondary | ICD-10-CM | POA: Diagnosis not present

## 2021-04-07 DIAGNOSIS — I6330 Cerebral infarction due to thrombosis of unspecified cerebral artery: Secondary | ICD-10-CM | POA: Diagnosis not present

## 2021-04-08 DIAGNOSIS — J439 Emphysema, unspecified: Secondary | ICD-10-CM | POA: Diagnosis not present

## 2021-04-08 DIAGNOSIS — I739 Peripheral vascular disease, unspecified: Secondary | ICD-10-CM | POA: Diagnosis not present

## 2021-04-08 DIAGNOSIS — K5900 Constipation, unspecified: Secondary | ICD-10-CM | POA: Diagnosis not present

## 2021-04-08 DIAGNOSIS — I6330 Cerebral infarction due to thrombosis of unspecified cerebral artery: Secondary | ICD-10-CM | POA: Diagnosis not present

## 2021-04-08 DIAGNOSIS — I10 Essential (primary) hypertension: Secondary | ICD-10-CM | POA: Diagnosis not present

## 2021-04-08 DIAGNOSIS — E559 Vitamin D deficiency, unspecified: Secondary | ICD-10-CM | POA: Diagnosis not present

## 2021-04-08 DIAGNOSIS — G629 Polyneuropathy, unspecified: Secondary | ICD-10-CM | POA: Diagnosis not present

## 2021-04-08 DIAGNOSIS — G4700 Insomnia, unspecified: Secondary | ICD-10-CM | POA: Diagnosis not present

## 2021-04-08 DIAGNOSIS — G8929 Other chronic pain: Secondary | ICD-10-CM | POA: Diagnosis not present

## 2021-04-08 DIAGNOSIS — E785 Hyperlipidemia, unspecified: Secondary | ICD-10-CM | POA: Diagnosis not present

## 2021-04-08 DIAGNOSIS — N400 Enlarged prostate without lower urinary tract symptoms: Secondary | ICD-10-CM | POA: Diagnosis not present

## 2021-04-09 DIAGNOSIS — I6330 Cerebral infarction due to thrombosis of unspecified cerebral artery: Secondary | ICD-10-CM | POA: Diagnosis not present

## 2021-04-10 DIAGNOSIS — I6330 Cerebral infarction due to thrombosis of unspecified cerebral artery: Secondary | ICD-10-CM | POA: Diagnosis not present

## 2021-04-11 DIAGNOSIS — I6330 Cerebral infarction due to thrombosis of unspecified cerebral artery: Secondary | ICD-10-CM | POA: Diagnosis not present

## 2021-04-12 DIAGNOSIS — I6330 Cerebral infarction due to thrombosis of unspecified cerebral artery: Secondary | ICD-10-CM | POA: Diagnosis not present

## 2021-04-13 DIAGNOSIS — I6330 Cerebral infarction due to thrombosis of unspecified cerebral artery: Secondary | ICD-10-CM | POA: Diagnosis not present

## 2021-04-14 DIAGNOSIS — I6330 Cerebral infarction due to thrombosis of unspecified cerebral artery: Secondary | ICD-10-CM | POA: Diagnosis not present

## 2021-04-15 DIAGNOSIS — I6330 Cerebral infarction due to thrombosis of unspecified cerebral artery: Secondary | ICD-10-CM | POA: Diagnosis not present

## 2021-04-16 DIAGNOSIS — I6330 Cerebral infarction due to thrombosis of unspecified cerebral artery: Secondary | ICD-10-CM | POA: Diagnosis not present

## 2021-04-17 DIAGNOSIS — I6330 Cerebral infarction due to thrombosis of unspecified cerebral artery: Secondary | ICD-10-CM | POA: Diagnosis not present

## 2021-04-18 DIAGNOSIS — I6330 Cerebral infarction due to thrombosis of unspecified cerebral artery: Secondary | ICD-10-CM | POA: Diagnosis not present

## 2021-04-19 DIAGNOSIS — I6330 Cerebral infarction due to thrombosis of unspecified cerebral artery: Secondary | ICD-10-CM | POA: Diagnosis not present

## 2021-04-20 DIAGNOSIS — I6330 Cerebral infarction due to thrombosis of unspecified cerebral artery: Secondary | ICD-10-CM | POA: Diagnosis not present

## 2021-04-21 DIAGNOSIS — I6330 Cerebral infarction due to thrombosis of unspecified cerebral artery: Secondary | ICD-10-CM | POA: Diagnosis not present

## 2021-04-22 DIAGNOSIS — I6330 Cerebral infarction due to thrombosis of unspecified cerebral artery: Secondary | ICD-10-CM | POA: Diagnosis not present

## 2021-04-23 DIAGNOSIS — I6330 Cerebral infarction due to thrombosis of unspecified cerebral artery: Secondary | ICD-10-CM | POA: Diagnosis not present

## 2021-04-24 DIAGNOSIS — I6330 Cerebral infarction due to thrombosis of unspecified cerebral artery: Secondary | ICD-10-CM | POA: Diagnosis not present

## 2021-04-25 DIAGNOSIS — I6330 Cerebral infarction due to thrombosis of unspecified cerebral artery: Secondary | ICD-10-CM | POA: Diagnosis not present

## 2021-04-26 DIAGNOSIS — I6330 Cerebral infarction due to thrombosis of unspecified cerebral artery: Secondary | ICD-10-CM | POA: Diagnosis not present

## 2021-04-27 DIAGNOSIS — I6330 Cerebral infarction due to thrombosis of unspecified cerebral artery: Secondary | ICD-10-CM | POA: Diagnosis not present

## 2021-04-28 DIAGNOSIS — I6330 Cerebral infarction due to thrombosis of unspecified cerebral artery: Secondary | ICD-10-CM | POA: Diagnosis not present

## 2021-04-29 DIAGNOSIS — I6330 Cerebral infarction due to thrombosis of unspecified cerebral artery: Secondary | ICD-10-CM | POA: Diagnosis not present

## 2021-04-30 DIAGNOSIS — I6330 Cerebral infarction due to thrombosis of unspecified cerebral artery: Secondary | ICD-10-CM | POA: Diagnosis not present

## 2021-04-30 DIAGNOSIS — F419 Anxiety disorder, unspecified: Secondary | ICD-10-CM | POA: Diagnosis not present

## 2021-04-30 DIAGNOSIS — I10 Essential (primary) hypertension: Secondary | ICD-10-CM | POA: Diagnosis not present

## 2021-05-01 DIAGNOSIS — I6330 Cerebral infarction due to thrombosis of unspecified cerebral artery: Secondary | ICD-10-CM | POA: Diagnosis not present

## 2021-05-01 IMAGING — MR MRI HEAD WITHOUT CONTRAST
16 series · 48 of 48 positions shown · non-contrast
Comparison: Head CT from 2 days ago

CLINICAL DATA: Encephalopathy

EXAM:
MRI HEAD WITHOUT CONTRAST
TECHNIQUE: Multiplanar, multiecho pulse sequences of the brain and surrounding
structures were obtained without intravenous contrast.

[Series 2: ax dwi_tracew · axial · 3.0mm · 1.31mm/px · z∈[-61,+92]mm · 6 of 94 slices shown (1 of 2)]
[im 1/94]
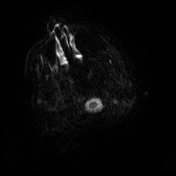
[im 19/94]
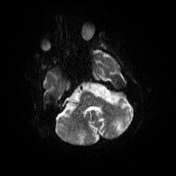
[im 38/94]
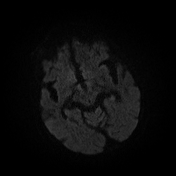
[im 56/94]
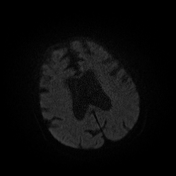
[im 75/94]
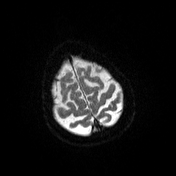
[im 94/94]
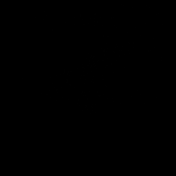

[Series 3: ax dwi_adc · axial · 3.0mm · 1.31mm/px · z∈[-61,+85]mm · 3 of 46 slices shown (1 of 2)]
[im 1/46]
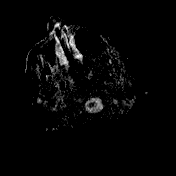
[im 23/46]
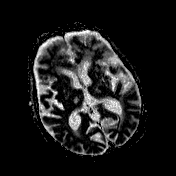
[im 46/46]
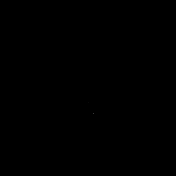

[Series 4: cor dwi_tracew · coronal · 5.0mm · 1.31mm/px · 5 of 76 slices shown]
[im 1/76]
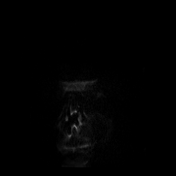
[im 19/76]
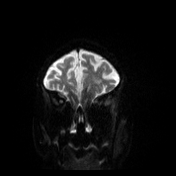
[im 38/76]
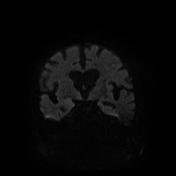
[im 57/76]
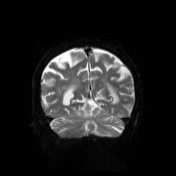
[im 76/76]
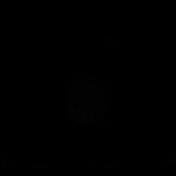

[Series 5: cor dwi_adc · coronal · 5.0mm · 1.31mm/px · 2 of 36 slices shown]
[im 1/36]
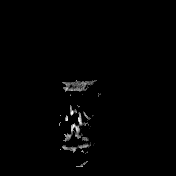
[im 36/36]
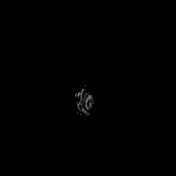

[Series 6: T1 · sagittal · 5.0mm · 0.94mm/px · 2 of 23 slices shown (1 of 3)]
[im 1/23]
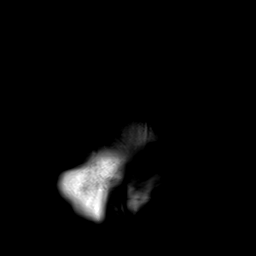
[im 23/23]
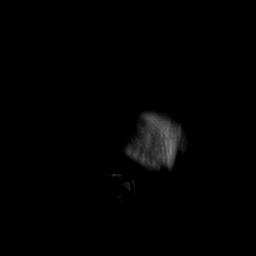

[Series 7: T2 · axial · 5.0mm · 0.45mm/px · z∈[-72,+83]mm · 2 of 27 slices shown (1 of 2)]
[im 1/27]
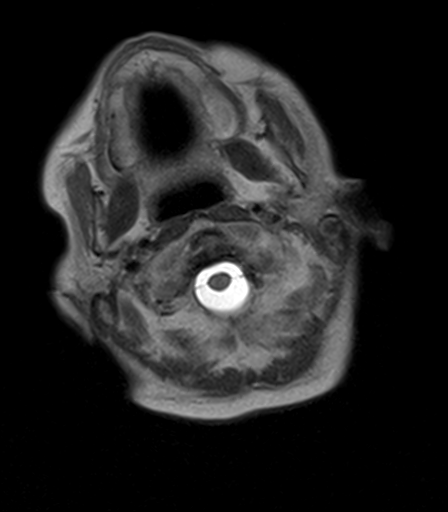
[im 27/27]
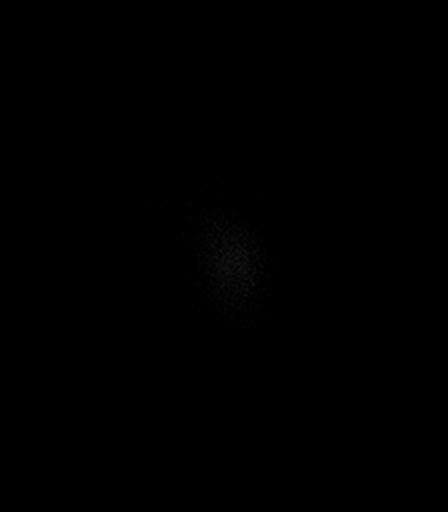

[Series 8: T1 · sagittal · 5.0mm · 0.94mm/px · 2 of 23 slices shown (2 of 3)]
[im 1/23]
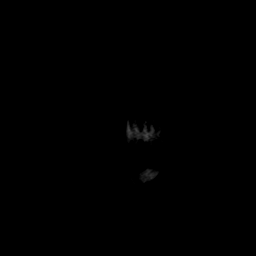
[im 23/23]
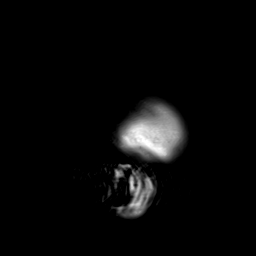

[Series 9: T2-star · axial · 5.0mm · 0.45mm/px · z∈[-72,+83]mm · 2 of 27 slices shown]
[im 1/27]
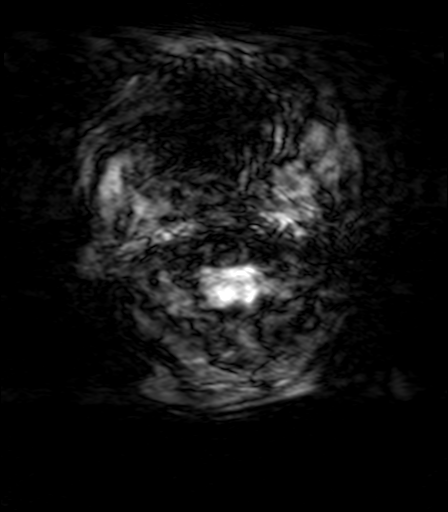
[im 27/27]
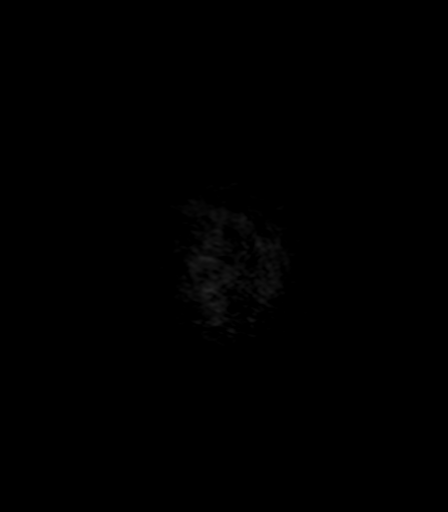

[Series 10: FLAIR · axial · 5.0mm · 1.20mm/px · z∈[-72,+83]mm · 2 of 27 slices shown]
[im 1/27]
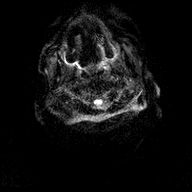
[im 27/27]
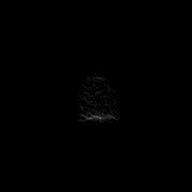

[Series 11: T1 · axial · 5.0mm · 0.90mm/px · z∈[-72,+83]mm · 2 of 27 slices shown (3 of 3)]
[im 1/27]
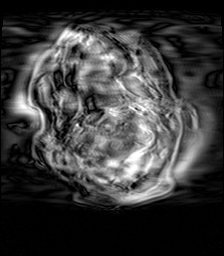
[im 27/27]
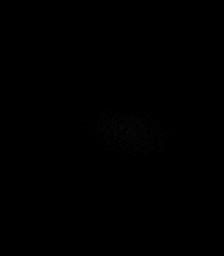

[Series 12: T2 · coronal · 5.0mm · 0.45mm/px · 2 of 31 slices shown (2 of 2)]
[im 1/31]
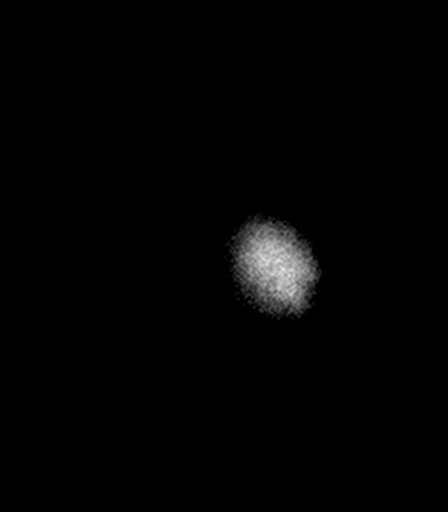
[im 31/31]
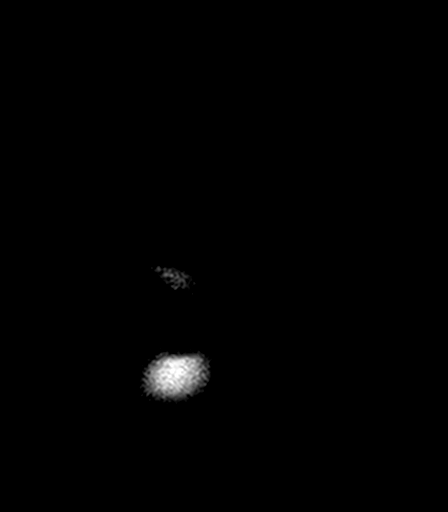

[Series 13: ax dwi_tracew · axial · 3.0mm · 1.31mm/px · z∈[-61,+92]mm · 6 of 95 slices shown (2 of 2)]
[im 1/95]
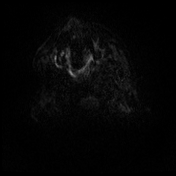
[im 19/95]
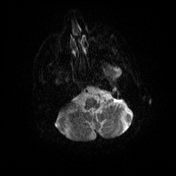
[im 38/95]
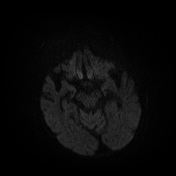
[im 57/95]
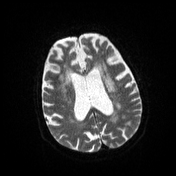
[im 76/95]
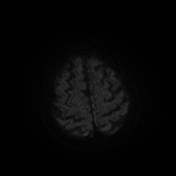
[im 95/95]
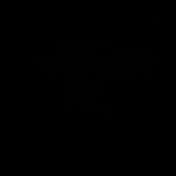

[Series 14: ax dwi_adc · axial · 3.0mm · 1.31mm/px · z∈[-61,+85]mm · 3 of 46 slices shown (2 of 2)]
[im 1/46]
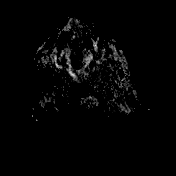
[im 23/46]
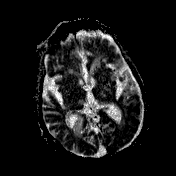
[im 46/46]
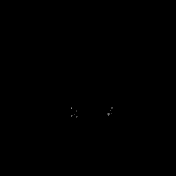

[Series 1003: results calc_(id)_b0_(id)_ax dwi_tracew · axial · 3.0mm · 1.31mm/px · z∈[-61,+88]mm · 3 of 47 slices shown (1 of 2)]
[im 1/47]
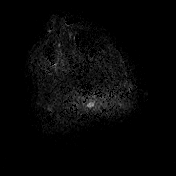
[im 24/47]
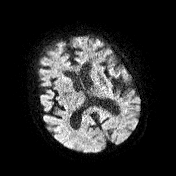
[im 47/47]
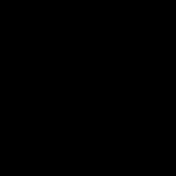

[Series 1003: results calc_(id)_b0_(id)_cor dwi_tracew · coronal · 5.0mm · 1.31mm/px · 3 of 38 slices shown]
[im 1/38]
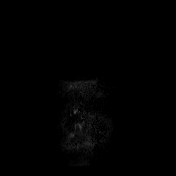
[im 19/38]
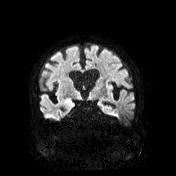
[im 38/38]
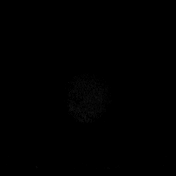

[Series 1003: results calc_(id)_b0_(id)_ax dwi_tracew · axial · 3.0mm · 1.31mm/px · z∈[-61,+88]mm · 3 of 47 slices shown (2 of 2)]
[im 1/47]
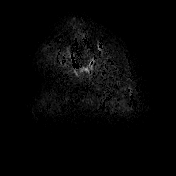
[im 24/47]
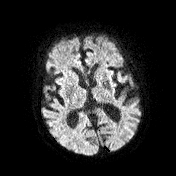
[im 47/47]
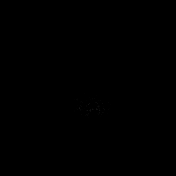

[48 of 48 positions shown; findings below may reference images not displayed]

FINDINGS: Brain: Subcentimeter acute infarct along the right parietal cortex.
Diffusion hyperintense focus in the left posterior cerebral white
matter on coronal diffusion imaging has no ADC correlate, suspect
shine through.

Chronic lacunes in the right thalamus, left corona radiata,
bilateral pons, and left centrum semiovale. There is superimposed
ischemic gliosis in the white matter that is confluent. Generalized
atrophy.

No evidence of acute hemorrhage, hydrocephalus, collection, or mass.

Vascular: Grossly preserved major flow voids

Skull and upper cervical spine: No gross bony lesion

Sinuses/Orbits: Bilateral cataract resection

Other: Significantly motion degraded study which was truncated.
IMPRESSION: 1. Subcentimeter acute infarct in the low right parietal cortex.
2. Background of atrophy and chronic small vessel ischemia that is
advanced-especially for age.
3. Motion degraded and truncated study.

## 2021-05-02 DIAGNOSIS — I6330 Cerebral infarction due to thrombosis of unspecified cerebral artery: Secondary | ICD-10-CM | POA: Diagnosis not present

## 2021-05-03 DIAGNOSIS — I6330 Cerebral infarction due to thrombosis of unspecified cerebral artery: Secondary | ICD-10-CM | POA: Diagnosis not present

## 2021-05-04 DIAGNOSIS — I6330 Cerebral infarction due to thrombosis of unspecified cerebral artery: Secondary | ICD-10-CM | POA: Diagnosis not present

## 2021-05-05 DIAGNOSIS — I6330 Cerebral infarction due to thrombosis of unspecified cerebral artery: Secondary | ICD-10-CM | POA: Diagnosis not present

## 2021-05-06 DIAGNOSIS — I6330 Cerebral infarction due to thrombosis of unspecified cerebral artery: Secondary | ICD-10-CM | POA: Diagnosis not present

## 2021-05-06 DIAGNOSIS — E46 Unspecified protein-calorie malnutrition: Secondary | ICD-10-CM | POA: Diagnosis not present

## 2021-05-06 DIAGNOSIS — E559 Vitamin D deficiency, unspecified: Secondary | ICD-10-CM | POA: Diagnosis not present

## 2021-05-06 DIAGNOSIS — G4700 Insomnia, unspecified: Secondary | ICD-10-CM | POA: Diagnosis not present

## 2021-05-06 DIAGNOSIS — G629 Polyneuropathy, unspecified: Secondary | ICD-10-CM | POA: Diagnosis not present

## 2021-05-06 DIAGNOSIS — R5381 Other malaise: Secondary | ICD-10-CM | POA: Diagnosis not present

## 2021-05-06 DIAGNOSIS — I10 Essential (primary) hypertension: Secondary | ICD-10-CM | POA: Diagnosis not present

## 2021-05-06 DIAGNOSIS — E785 Hyperlipidemia, unspecified: Secondary | ICD-10-CM | POA: Diagnosis not present

## 2021-05-06 DIAGNOSIS — N400 Enlarged prostate without lower urinary tract symptoms: Secondary | ICD-10-CM | POA: Diagnosis not present

## 2021-05-06 DIAGNOSIS — K5900 Constipation, unspecified: Secondary | ICD-10-CM | POA: Diagnosis not present

## 2021-05-07 DIAGNOSIS — I6330 Cerebral infarction due to thrombosis of unspecified cerebral artery: Secondary | ICD-10-CM | POA: Diagnosis not present

## 2021-05-08 DIAGNOSIS — I6330 Cerebral infarction due to thrombosis of unspecified cerebral artery: Secondary | ICD-10-CM | POA: Diagnosis not present

## 2021-05-08 DIAGNOSIS — Z419 Encounter for procedure for purposes other than remedying health state, unspecified: Secondary | ICD-10-CM | POA: Diagnosis not present

## 2021-05-09 DIAGNOSIS — E559 Vitamin D deficiency, unspecified: Secondary | ICD-10-CM | POA: Diagnosis not present

## 2021-05-09 DIAGNOSIS — I6330 Cerebral infarction due to thrombosis of unspecified cerebral artery: Secondary | ICD-10-CM | POA: Diagnosis not present

## 2021-05-09 DIAGNOSIS — E7849 Other hyperlipidemia: Secondary | ICD-10-CM | POA: Diagnosis not present

## 2021-05-09 DIAGNOSIS — E119 Type 2 diabetes mellitus without complications: Secondary | ICD-10-CM | POA: Diagnosis not present

## 2021-05-09 DIAGNOSIS — Z79899 Other long term (current) drug therapy: Secondary | ICD-10-CM | POA: Diagnosis not present

## 2021-05-09 DIAGNOSIS — E038 Other specified hypothyroidism: Secondary | ICD-10-CM | POA: Diagnosis not present

## 2021-05-09 DIAGNOSIS — D518 Other vitamin B12 deficiency anemias: Secondary | ICD-10-CM | POA: Diagnosis not present

## 2021-05-10 DIAGNOSIS — I6330 Cerebral infarction due to thrombosis of unspecified cerebral artery: Secondary | ICD-10-CM | POA: Diagnosis not present

## 2021-05-11 DIAGNOSIS — I6330 Cerebral infarction due to thrombosis of unspecified cerebral artery: Secondary | ICD-10-CM | POA: Diagnosis not present

## 2021-05-12 DIAGNOSIS — I6330 Cerebral infarction due to thrombosis of unspecified cerebral artery: Secondary | ICD-10-CM | POA: Diagnosis not present

## 2021-05-13 DIAGNOSIS — I6330 Cerebral infarction due to thrombosis of unspecified cerebral artery: Secondary | ICD-10-CM | POA: Diagnosis not present

## 2021-05-14 DIAGNOSIS — I6330 Cerebral infarction due to thrombosis of unspecified cerebral artery: Secondary | ICD-10-CM | POA: Diagnosis not present

## 2021-05-15 DIAGNOSIS — I6330 Cerebral infarction due to thrombosis of unspecified cerebral artery: Secondary | ICD-10-CM | POA: Diagnosis not present

## 2021-05-16 DIAGNOSIS — I6330 Cerebral infarction due to thrombosis of unspecified cerebral artery: Secondary | ICD-10-CM | POA: Diagnosis not present

## 2021-05-17 DIAGNOSIS — I6330 Cerebral infarction due to thrombosis of unspecified cerebral artery: Secondary | ICD-10-CM | POA: Diagnosis not present

## 2021-05-18 DIAGNOSIS — I6330 Cerebral infarction due to thrombosis of unspecified cerebral artery: Secondary | ICD-10-CM | POA: Diagnosis not present

## 2021-05-19 DIAGNOSIS — I6330 Cerebral infarction due to thrombosis of unspecified cerebral artery: Secondary | ICD-10-CM | POA: Diagnosis not present

## 2021-05-20 DIAGNOSIS — I10 Essential (primary) hypertension: Secondary | ICD-10-CM | POA: Diagnosis not present

## 2021-05-20 DIAGNOSIS — I6330 Cerebral infarction due to thrombosis of unspecified cerebral artery: Secondary | ICD-10-CM | POA: Diagnosis not present

## 2021-05-21 DIAGNOSIS — F419 Anxiety disorder, unspecified: Secondary | ICD-10-CM | POA: Diagnosis not present

## 2021-05-21 DIAGNOSIS — I6330 Cerebral infarction due to thrombosis of unspecified cerebral artery: Secondary | ICD-10-CM | POA: Diagnosis not present

## 2021-05-22 DIAGNOSIS — I6330 Cerebral infarction due to thrombosis of unspecified cerebral artery: Secondary | ICD-10-CM | POA: Diagnosis not present

## 2021-05-23 DIAGNOSIS — I6330 Cerebral infarction due to thrombosis of unspecified cerebral artery: Secondary | ICD-10-CM | POA: Diagnosis not present

## 2021-05-24 DIAGNOSIS — I6330 Cerebral infarction due to thrombosis of unspecified cerebral artery: Secondary | ICD-10-CM | POA: Diagnosis not present

## 2021-05-25 DIAGNOSIS — I6330 Cerebral infarction due to thrombosis of unspecified cerebral artery: Secondary | ICD-10-CM | POA: Diagnosis not present

## 2021-05-26 DIAGNOSIS — I6330 Cerebral infarction due to thrombosis of unspecified cerebral artery: Secondary | ICD-10-CM | POA: Diagnosis not present

## 2021-05-27 DIAGNOSIS — I6330 Cerebral infarction due to thrombosis of unspecified cerebral artery: Secondary | ICD-10-CM | POA: Diagnosis not present

## 2021-05-28 DIAGNOSIS — I6330 Cerebral infarction due to thrombosis of unspecified cerebral artery: Secondary | ICD-10-CM | POA: Diagnosis not present

## 2021-05-29 DIAGNOSIS — I6330 Cerebral infarction due to thrombosis of unspecified cerebral artery: Secondary | ICD-10-CM | POA: Diagnosis not present

## 2021-05-30 DIAGNOSIS — I6330 Cerebral infarction due to thrombosis of unspecified cerebral artery: Secondary | ICD-10-CM | POA: Diagnosis not present

## 2021-05-31 DIAGNOSIS — I6330 Cerebral infarction due to thrombosis of unspecified cerebral artery: Secondary | ICD-10-CM | POA: Diagnosis not present

## 2021-06-01 DIAGNOSIS — I6330 Cerebral infarction due to thrombosis of unspecified cerebral artery: Secondary | ICD-10-CM | POA: Diagnosis not present

## 2021-06-02 DIAGNOSIS — I6330 Cerebral infarction due to thrombosis of unspecified cerebral artery: Secondary | ICD-10-CM | POA: Diagnosis not present

## 2021-06-03 DIAGNOSIS — K5900 Constipation, unspecified: Secondary | ICD-10-CM | POA: Diagnosis not present

## 2021-06-03 DIAGNOSIS — G629 Polyneuropathy, unspecified: Secondary | ICD-10-CM | POA: Diagnosis not present

## 2021-06-03 DIAGNOSIS — E46 Unspecified protein-calorie malnutrition: Secondary | ICD-10-CM | POA: Diagnosis not present

## 2021-06-03 DIAGNOSIS — E559 Vitamin D deficiency, unspecified: Secondary | ICD-10-CM | POA: Diagnosis not present

## 2021-06-03 DIAGNOSIS — N400 Enlarged prostate without lower urinary tract symptoms: Secondary | ICD-10-CM | POA: Diagnosis not present

## 2021-06-03 DIAGNOSIS — I6330 Cerebral infarction due to thrombosis of unspecified cerebral artery: Secondary | ICD-10-CM | POA: Diagnosis not present

## 2021-06-03 DIAGNOSIS — R5381 Other malaise: Secondary | ICD-10-CM | POA: Diagnosis not present

## 2021-06-03 DIAGNOSIS — E785 Hyperlipidemia, unspecified: Secondary | ICD-10-CM | POA: Diagnosis not present

## 2021-06-03 DIAGNOSIS — I10 Essential (primary) hypertension: Secondary | ICD-10-CM | POA: Diagnosis not present

## 2021-06-04 DIAGNOSIS — I6330 Cerebral infarction due to thrombosis of unspecified cerebral artery: Secondary | ICD-10-CM | POA: Diagnosis not present

## 2021-06-05 DIAGNOSIS — I6330 Cerebral infarction due to thrombosis of unspecified cerebral artery: Secondary | ICD-10-CM | POA: Diagnosis not present

## 2021-06-06 DIAGNOSIS — I6330 Cerebral infarction due to thrombosis of unspecified cerebral artery: Secondary | ICD-10-CM | POA: Diagnosis not present

## 2021-06-07 DIAGNOSIS — Z419 Encounter for procedure for purposes other than remedying health state, unspecified: Secondary | ICD-10-CM | POA: Diagnosis not present

## 2021-06-07 DIAGNOSIS — I6330 Cerebral infarction due to thrombosis of unspecified cerebral artery: Secondary | ICD-10-CM | POA: Diagnosis not present

## 2021-06-08 DIAGNOSIS — I6330 Cerebral infarction due to thrombosis of unspecified cerebral artery: Secondary | ICD-10-CM | POA: Diagnosis not present

## 2021-06-09 DIAGNOSIS — I6330 Cerebral infarction due to thrombosis of unspecified cerebral artery: Secondary | ICD-10-CM | POA: Diagnosis not present

## 2021-06-10 DIAGNOSIS — I6330 Cerebral infarction due to thrombosis of unspecified cerebral artery: Secondary | ICD-10-CM | POA: Diagnosis not present

## 2021-06-11 DIAGNOSIS — I6330 Cerebral infarction due to thrombosis of unspecified cerebral artery: Secondary | ICD-10-CM | POA: Diagnosis not present

## 2021-06-12 DIAGNOSIS — I6330 Cerebral infarction due to thrombosis of unspecified cerebral artery: Secondary | ICD-10-CM | POA: Diagnosis not present

## 2021-06-13 DIAGNOSIS — I6330 Cerebral infarction due to thrombosis of unspecified cerebral artery: Secondary | ICD-10-CM | POA: Diagnosis not present

## 2021-06-14 DIAGNOSIS — I6330 Cerebral infarction due to thrombosis of unspecified cerebral artery: Secondary | ICD-10-CM | POA: Diagnosis not present

## 2021-06-15 DIAGNOSIS — I6330 Cerebral infarction due to thrombosis of unspecified cerebral artery: Secondary | ICD-10-CM | POA: Diagnosis not present

## 2021-06-16 DIAGNOSIS — I6330 Cerebral infarction due to thrombosis of unspecified cerebral artery: Secondary | ICD-10-CM | POA: Diagnosis not present

## 2021-06-17 DIAGNOSIS — I6330 Cerebral infarction due to thrombosis of unspecified cerebral artery: Secondary | ICD-10-CM | POA: Diagnosis not present

## 2021-06-18 DIAGNOSIS — I6330 Cerebral infarction due to thrombosis of unspecified cerebral artery: Secondary | ICD-10-CM | POA: Diagnosis not present

## 2021-06-19 DIAGNOSIS — I6330 Cerebral infarction due to thrombosis of unspecified cerebral artery: Secondary | ICD-10-CM | POA: Diagnosis not present

## 2021-06-20 DIAGNOSIS — I6330 Cerebral infarction due to thrombosis of unspecified cerebral artery: Secondary | ICD-10-CM | POA: Diagnosis not present

## 2021-06-21 DIAGNOSIS — I6330 Cerebral infarction due to thrombosis of unspecified cerebral artery: Secondary | ICD-10-CM | POA: Diagnosis not present

## 2021-06-22 DIAGNOSIS — I6330 Cerebral infarction due to thrombosis of unspecified cerebral artery: Secondary | ICD-10-CM | POA: Diagnosis not present

## 2021-06-23 DIAGNOSIS — F419 Anxiety disorder, unspecified: Secondary | ICD-10-CM | POA: Diagnosis not present

## 2021-06-23 DIAGNOSIS — I6330 Cerebral infarction due to thrombosis of unspecified cerebral artery: Secondary | ICD-10-CM | POA: Diagnosis not present

## 2021-06-24 DIAGNOSIS — I6330 Cerebral infarction due to thrombosis of unspecified cerebral artery: Secondary | ICD-10-CM | POA: Diagnosis not present

## 2021-06-25 DIAGNOSIS — I6330 Cerebral infarction due to thrombosis of unspecified cerebral artery: Secondary | ICD-10-CM | POA: Diagnosis not present

## 2021-06-26 DIAGNOSIS — I6330 Cerebral infarction due to thrombosis of unspecified cerebral artery: Secondary | ICD-10-CM | POA: Diagnosis not present

## 2021-06-27 DIAGNOSIS — I6330 Cerebral infarction due to thrombosis of unspecified cerebral artery: Secondary | ICD-10-CM | POA: Diagnosis not present

## 2021-06-28 DIAGNOSIS — I6330 Cerebral infarction due to thrombosis of unspecified cerebral artery: Secondary | ICD-10-CM | POA: Diagnosis not present

## 2021-06-29 DIAGNOSIS — I6330 Cerebral infarction due to thrombosis of unspecified cerebral artery: Secondary | ICD-10-CM | POA: Diagnosis not present

## 2021-06-30 DIAGNOSIS — I6330 Cerebral infarction due to thrombosis of unspecified cerebral artery: Secondary | ICD-10-CM | POA: Diagnosis not present

## 2021-07-01 DIAGNOSIS — J439 Emphysema, unspecified: Secondary | ICD-10-CM | POA: Diagnosis not present

## 2021-07-01 DIAGNOSIS — G4700 Insomnia, unspecified: Secondary | ICD-10-CM | POA: Diagnosis not present

## 2021-07-01 DIAGNOSIS — I6330 Cerebral infarction due to thrombosis of unspecified cerebral artery: Secondary | ICD-10-CM | POA: Diagnosis not present

## 2021-07-01 DIAGNOSIS — E785 Hyperlipidemia, unspecified: Secondary | ICD-10-CM | POA: Diagnosis not present

## 2021-07-01 DIAGNOSIS — K5900 Constipation, unspecified: Secondary | ICD-10-CM | POA: Diagnosis not present

## 2021-07-01 DIAGNOSIS — N400 Enlarged prostate without lower urinary tract symptoms: Secondary | ICD-10-CM | POA: Diagnosis not present

## 2021-07-01 DIAGNOSIS — R5381 Other malaise: Secondary | ICD-10-CM | POA: Diagnosis not present

## 2021-07-01 DIAGNOSIS — I10 Essential (primary) hypertension: Secondary | ICD-10-CM | POA: Diagnosis not present

## 2021-07-01 DIAGNOSIS — E559 Vitamin D deficiency, unspecified: Secondary | ICD-10-CM | POA: Diagnosis not present

## 2021-07-01 DIAGNOSIS — G629 Polyneuropathy, unspecified: Secondary | ICD-10-CM | POA: Diagnosis not present

## 2021-07-02 DIAGNOSIS — I6330 Cerebral infarction due to thrombosis of unspecified cerebral artery: Secondary | ICD-10-CM | POA: Diagnosis not present

## 2021-07-03 DIAGNOSIS — I6330 Cerebral infarction due to thrombosis of unspecified cerebral artery: Secondary | ICD-10-CM | POA: Diagnosis not present

## 2021-07-04 DIAGNOSIS — I6330 Cerebral infarction due to thrombosis of unspecified cerebral artery: Secondary | ICD-10-CM | POA: Diagnosis not present

## 2021-07-04 DIAGNOSIS — I10 Essential (primary) hypertension: Secondary | ICD-10-CM | POA: Diagnosis not present

## 2021-07-05 DIAGNOSIS — I6330 Cerebral infarction due to thrombosis of unspecified cerebral artery: Secondary | ICD-10-CM | POA: Diagnosis not present

## 2021-07-06 DIAGNOSIS — I6330 Cerebral infarction due to thrombosis of unspecified cerebral artery: Secondary | ICD-10-CM | POA: Diagnosis not present

## 2021-07-07 DIAGNOSIS — I6330 Cerebral infarction due to thrombosis of unspecified cerebral artery: Secondary | ICD-10-CM | POA: Diagnosis not present

## 2021-07-08 DIAGNOSIS — I6330 Cerebral infarction due to thrombosis of unspecified cerebral artery: Secondary | ICD-10-CM | POA: Diagnosis not present

## 2021-07-08 DIAGNOSIS — Z419 Encounter for procedure for purposes other than remedying health state, unspecified: Secondary | ICD-10-CM | POA: Diagnosis not present

## 2021-07-09 DIAGNOSIS — I6330 Cerebral infarction due to thrombosis of unspecified cerebral artery: Secondary | ICD-10-CM | POA: Diagnosis not present

## 2021-07-10 DIAGNOSIS — I6330 Cerebral infarction due to thrombosis of unspecified cerebral artery: Secondary | ICD-10-CM | POA: Diagnosis not present

## 2021-07-11 DIAGNOSIS — I6330 Cerebral infarction due to thrombosis of unspecified cerebral artery: Secondary | ICD-10-CM | POA: Diagnosis not present

## 2021-07-12 DIAGNOSIS — I6330 Cerebral infarction due to thrombosis of unspecified cerebral artery: Secondary | ICD-10-CM | POA: Diagnosis not present

## 2021-07-13 DIAGNOSIS — I6330 Cerebral infarction due to thrombosis of unspecified cerebral artery: Secondary | ICD-10-CM | POA: Diagnosis not present

## 2021-07-14 DIAGNOSIS — I6330 Cerebral infarction due to thrombosis of unspecified cerebral artery: Secondary | ICD-10-CM | POA: Diagnosis not present

## 2021-07-15 DIAGNOSIS — I6330 Cerebral infarction due to thrombosis of unspecified cerebral artery: Secondary | ICD-10-CM | POA: Diagnosis not present

## 2021-07-16 DIAGNOSIS — I6330 Cerebral infarction due to thrombosis of unspecified cerebral artery: Secondary | ICD-10-CM | POA: Diagnosis not present

## 2021-07-17 DIAGNOSIS — I6330 Cerebral infarction due to thrombosis of unspecified cerebral artery: Secondary | ICD-10-CM | POA: Diagnosis not present

## 2021-07-18 DIAGNOSIS — I6330 Cerebral infarction due to thrombosis of unspecified cerebral artery: Secondary | ICD-10-CM | POA: Diagnosis not present

## 2021-07-19 DIAGNOSIS — I6330 Cerebral infarction due to thrombosis of unspecified cerebral artery: Secondary | ICD-10-CM | POA: Diagnosis not present

## 2021-07-20 DIAGNOSIS — I6330 Cerebral infarction due to thrombosis of unspecified cerebral artery: Secondary | ICD-10-CM | POA: Diagnosis not present

## 2021-07-21 DIAGNOSIS — I6330 Cerebral infarction due to thrombosis of unspecified cerebral artery: Secondary | ICD-10-CM | POA: Diagnosis not present

## 2021-07-22 DIAGNOSIS — I6330 Cerebral infarction due to thrombosis of unspecified cerebral artery: Secondary | ICD-10-CM | POA: Diagnosis not present

## 2021-07-23 DIAGNOSIS — F419 Anxiety disorder, unspecified: Secondary | ICD-10-CM | POA: Diagnosis not present

## 2021-07-23 DIAGNOSIS — I6330 Cerebral infarction due to thrombosis of unspecified cerebral artery: Secondary | ICD-10-CM | POA: Diagnosis not present

## 2021-07-24 DIAGNOSIS — I6330 Cerebral infarction due to thrombosis of unspecified cerebral artery: Secondary | ICD-10-CM | POA: Diagnosis not present

## 2021-07-25 DIAGNOSIS — I6330 Cerebral infarction due to thrombosis of unspecified cerebral artery: Secondary | ICD-10-CM | POA: Diagnosis not present

## 2021-07-26 DIAGNOSIS — I6330 Cerebral infarction due to thrombosis of unspecified cerebral artery: Secondary | ICD-10-CM | POA: Diagnosis not present

## 2021-07-27 DIAGNOSIS — I6330 Cerebral infarction due to thrombosis of unspecified cerebral artery: Secondary | ICD-10-CM | POA: Diagnosis not present

## 2021-07-28 DIAGNOSIS — I6330 Cerebral infarction due to thrombosis of unspecified cerebral artery: Secondary | ICD-10-CM | POA: Diagnosis not present

## 2021-07-29 DIAGNOSIS — I6330 Cerebral infarction due to thrombosis of unspecified cerebral artery: Secondary | ICD-10-CM | POA: Diagnosis not present

## 2021-07-30 DIAGNOSIS — I6330 Cerebral infarction due to thrombosis of unspecified cerebral artery: Secondary | ICD-10-CM | POA: Diagnosis not present

## 2021-07-31 DIAGNOSIS — I6330 Cerebral infarction due to thrombosis of unspecified cerebral artery: Secondary | ICD-10-CM | POA: Diagnosis not present

## 2021-08-01 DIAGNOSIS — I6330 Cerebral infarction due to thrombosis of unspecified cerebral artery: Secondary | ICD-10-CM | POA: Diagnosis not present

## 2021-08-02 DIAGNOSIS — I6330 Cerebral infarction due to thrombosis of unspecified cerebral artery: Secondary | ICD-10-CM | POA: Diagnosis not present

## 2021-08-03 DIAGNOSIS — I6330 Cerebral infarction due to thrombosis of unspecified cerebral artery: Secondary | ICD-10-CM | POA: Diagnosis not present

## 2021-08-04 DIAGNOSIS — I6330 Cerebral infarction due to thrombosis of unspecified cerebral artery: Secondary | ICD-10-CM | POA: Diagnosis not present

## 2021-08-05 DIAGNOSIS — K5900 Constipation, unspecified: Secondary | ICD-10-CM | POA: Diagnosis not present

## 2021-08-05 DIAGNOSIS — J439 Emphysema, unspecified: Secondary | ICD-10-CM | POA: Diagnosis not present

## 2021-08-05 DIAGNOSIS — E785 Hyperlipidemia, unspecified: Secondary | ICD-10-CM | POA: Diagnosis not present

## 2021-08-05 DIAGNOSIS — I6330 Cerebral infarction due to thrombosis of unspecified cerebral artery: Secondary | ICD-10-CM | POA: Diagnosis not present

## 2021-08-05 DIAGNOSIS — E46 Unspecified protein-calorie malnutrition: Secondary | ICD-10-CM | POA: Diagnosis not present

## 2021-08-05 DIAGNOSIS — G8929 Other chronic pain: Secondary | ICD-10-CM | POA: Diagnosis not present

## 2021-08-05 DIAGNOSIS — I739 Peripheral vascular disease, unspecified: Secondary | ICD-10-CM | POA: Diagnosis not present

## 2021-08-05 DIAGNOSIS — G629 Polyneuropathy, unspecified: Secondary | ICD-10-CM | POA: Diagnosis not present

## 2021-08-05 DIAGNOSIS — N400 Enlarged prostate without lower urinary tract symptoms: Secondary | ICD-10-CM | POA: Diagnosis not present

## 2021-08-05 DIAGNOSIS — E559 Vitamin D deficiency, unspecified: Secondary | ICD-10-CM | POA: Diagnosis not present

## 2021-08-05 DIAGNOSIS — G4700 Insomnia, unspecified: Secondary | ICD-10-CM | POA: Diagnosis not present

## 2021-08-06 DIAGNOSIS — I6330 Cerebral infarction due to thrombosis of unspecified cerebral artery: Secondary | ICD-10-CM | POA: Diagnosis not present

## 2021-08-06 DIAGNOSIS — I10 Essential (primary) hypertension: Secondary | ICD-10-CM | POA: Diagnosis not present

## 2021-08-07 DIAGNOSIS — Z419 Encounter for procedure for purposes other than remedying health state, unspecified: Secondary | ICD-10-CM | POA: Diagnosis not present

## 2021-08-07 DIAGNOSIS — I6330 Cerebral infarction due to thrombosis of unspecified cerebral artery: Secondary | ICD-10-CM | POA: Diagnosis not present

## 2021-08-08 DIAGNOSIS — I6330 Cerebral infarction due to thrombosis of unspecified cerebral artery: Secondary | ICD-10-CM | POA: Diagnosis not present

## 2021-08-09 DIAGNOSIS — I6330 Cerebral infarction due to thrombosis of unspecified cerebral artery: Secondary | ICD-10-CM | POA: Diagnosis not present

## 2021-08-10 DIAGNOSIS — I6330 Cerebral infarction due to thrombosis of unspecified cerebral artery: Secondary | ICD-10-CM | POA: Diagnosis not present

## 2021-08-11 DIAGNOSIS — I6330 Cerebral infarction due to thrombosis of unspecified cerebral artery: Secondary | ICD-10-CM | POA: Diagnosis not present

## 2021-08-12 DIAGNOSIS — I6330 Cerebral infarction due to thrombosis of unspecified cerebral artery: Secondary | ICD-10-CM | POA: Diagnosis not present

## 2021-08-13 DIAGNOSIS — I6330 Cerebral infarction due to thrombosis of unspecified cerebral artery: Secondary | ICD-10-CM | POA: Diagnosis not present

## 2021-08-14 DIAGNOSIS — F419 Anxiety disorder, unspecified: Secondary | ICD-10-CM | POA: Diagnosis not present

## 2021-08-14 DIAGNOSIS — I6330 Cerebral infarction due to thrombosis of unspecified cerebral artery: Secondary | ICD-10-CM | POA: Diagnosis not present

## 2021-08-15 DIAGNOSIS — I6330 Cerebral infarction due to thrombosis of unspecified cerebral artery: Secondary | ICD-10-CM | POA: Diagnosis not present

## 2021-08-16 DIAGNOSIS — I6330 Cerebral infarction due to thrombosis of unspecified cerebral artery: Secondary | ICD-10-CM | POA: Diagnosis not present

## 2021-08-17 DIAGNOSIS — I6330 Cerebral infarction due to thrombosis of unspecified cerebral artery: Secondary | ICD-10-CM | POA: Diagnosis not present

## 2021-08-18 DIAGNOSIS — I6330 Cerebral infarction due to thrombosis of unspecified cerebral artery: Secondary | ICD-10-CM | POA: Diagnosis not present

## 2021-08-19 ENCOUNTER — Ambulatory Visit (INDEPENDENT_AMBULATORY_CARE_PROVIDER_SITE_OTHER): Payer: Medicaid Other

## 2021-08-19 ENCOUNTER — Other Ambulatory Visit: Payer: Self-pay

## 2021-08-19 ENCOUNTER — Ambulatory Visit (INDEPENDENT_AMBULATORY_CARE_PROVIDER_SITE_OTHER): Payer: Medicaid Other | Admitting: Vascular Surgery

## 2021-08-19 DIAGNOSIS — I70261 Atherosclerosis of native arteries of extremities with gangrene, right leg: Secondary | ICD-10-CM

## 2021-08-19 DIAGNOSIS — I6330 Cerebral infarction due to thrombosis of unspecified cerebral artery: Secondary | ICD-10-CM | POA: Diagnosis not present

## 2021-08-20 DIAGNOSIS — I6330 Cerebral infarction due to thrombosis of unspecified cerebral artery: Secondary | ICD-10-CM | POA: Diagnosis not present

## 2021-08-21 DIAGNOSIS — I6330 Cerebral infarction due to thrombosis of unspecified cerebral artery: Secondary | ICD-10-CM | POA: Diagnosis not present

## 2021-08-22 ENCOUNTER — Encounter (INDEPENDENT_AMBULATORY_CARE_PROVIDER_SITE_OTHER): Payer: Self-pay | Admitting: *Deleted

## 2021-08-22 DIAGNOSIS — I6330 Cerebral infarction due to thrombosis of unspecified cerebral artery: Secondary | ICD-10-CM | POA: Diagnosis not present

## 2021-08-23 DIAGNOSIS — I6330 Cerebral infarction due to thrombosis of unspecified cerebral artery: Secondary | ICD-10-CM | POA: Diagnosis not present

## 2021-08-24 DIAGNOSIS — I6330 Cerebral infarction due to thrombosis of unspecified cerebral artery: Secondary | ICD-10-CM | POA: Diagnosis not present

## 2021-08-25 DIAGNOSIS — I6330 Cerebral infarction due to thrombosis of unspecified cerebral artery: Secondary | ICD-10-CM | POA: Diagnosis not present

## 2021-08-26 DIAGNOSIS — I6330 Cerebral infarction due to thrombosis of unspecified cerebral artery: Secondary | ICD-10-CM | POA: Diagnosis not present

## 2021-08-27 DIAGNOSIS — I6330 Cerebral infarction due to thrombosis of unspecified cerebral artery: Secondary | ICD-10-CM | POA: Diagnosis not present

## 2021-08-28 DIAGNOSIS — I6330 Cerebral infarction due to thrombosis of unspecified cerebral artery: Secondary | ICD-10-CM | POA: Diagnosis not present

## 2021-08-29 DIAGNOSIS — I6330 Cerebral infarction due to thrombosis of unspecified cerebral artery: Secondary | ICD-10-CM | POA: Diagnosis not present

## 2021-08-30 DIAGNOSIS — I6330 Cerebral infarction due to thrombosis of unspecified cerebral artery: Secondary | ICD-10-CM | POA: Diagnosis not present

## 2021-08-31 DIAGNOSIS — I6330 Cerebral infarction due to thrombosis of unspecified cerebral artery: Secondary | ICD-10-CM | POA: Diagnosis not present

## 2021-09-01 DIAGNOSIS — I6330 Cerebral infarction due to thrombosis of unspecified cerebral artery: Secondary | ICD-10-CM | POA: Diagnosis not present

## 2021-09-02 DIAGNOSIS — I6330 Cerebral infarction due to thrombosis of unspecified cerebral artery: Secondary | ICD-10-CM | POA: Diagnosis not present

## 2021-09-03 DIAGNOSIS — I6330 Cerebral infarction due to thrombosis of unspecified cerebral artery: Secondary | ICD-10-CM | POA: Diagnosis not present

## 2021-09-04 DIAGNOSIS — I10 Essential (primary) hypertension: Secondary | ICD-10-CM | POA: Diagnosis not present

## 2021-09-04 DIAGNOSIS — I6330 Cerebral infarction due to thrombosis of unspecified cerebral artery: Secondary | ICD-10-CM | POA: Diagnosis not present

## 2021-09-05 DIAGNOSIS — I6330 Cerebral infarction due to thrombosis of unspecified cerebral artery: Secondary | ICD-10-CM | POA: Diagnosis not present

## 2021-09-06 DIAGNOSIS — I6330 Cerebral infarction due to thrombosis of unspecified cerebral artery: Secondary | ICD-10-CM | POA: Diagnosis not present

## 2021-09-07 DIAGNOSIS — I6330 Cerebral infarction due to thrombosis of unspecified cerebral artery: Secondary | ICD-10-CM | POA: Diagnosis not present

## 2021-09-07 DIAGNOSIS — Z419 Encounter for procedure for purposes other than remedying health state, unspecified: Secondary | ICD-10-CM | POA: Diagnosis not present

## 2021-09-08 DIAGNOSIS — I6330 Cerebral infarction due to thrombosis of unspecified cerebral artery: Secondary | ICD-10-CM | POA: Diagnosis not present

## 2021-09-09 DIAGNOSIS — K5900 Constipation, unspecified: Secondary | ICD-10-CM | POA: Diagnosis not present

## 2021-09-09 DIAGNOSIS — E785 Hyperlipidemia, unspecified: Secondary | ICD-10-CM | POA: Diagnosis not present

## 2021-09-09 DIAGNOSIS — G4700 Insomnia, unspecified: Secondary | ICD-10-CM | POA: Diagnosis not present

## 2021-09-09 DIAGNOSIS — R5381 Other malaise: Secondary | ICD-10-CM | POA: Diagnosis not present

## 2021-09-09 DIAGNOSIS — G629 Polyneuropathy, unspecified: Secondary | ICD-10-CM | POA: Diagnosis not present

## 2021-09-09 DIAGNOSIS — J439 Emphysema, unspecified: Secondary | ICD-10-CM | POA: Diagnosis not present

## 2021-09-09 DIAGNOSIS — E46 Unspecified protein-calorie malnutrition: Secondary | ICD-10-CM | POA: Diagnosis not present

## 2021-09-09 DIAGNOSIS — E559 Vitamin D deficiency, unspecified: Secondary | ICD-10-CM | POA: Diagnosis not present

## 2021-09-09 DIAGNOSIS — N400 Enlarged prostate without lower urinary tract symptoms: Secondary | ICD-10-CM | POA: Diagnosis not present

## 2021-09-09 DIAGNOSIS — G8929 Other chronic pain: Secondary | ICD-10-CM | POA: Diagnosis not present

## 2021-09-09 DIAGNOSIS — I6330 Cerebral infarction due to thrombosis of unspecified cerebral artery: Secondary | ICD-10-CM | POA: Diagnosis not present

## 2021-09-10 DIAGNOSIS — I6330 Cerebral infarction due to thrombosis of unspecified cerebral artery: Secondary | ICD-10-CM | POA: Diagnosis not present

## 2021-09-11 DIAGNOSIS — I6330 Cerebral infarction due to thrombosis of unspecified cerebral artery: Secondary | ICD-10-CM | POA: Diagnosis not present

## 2021-09-12 DIAGNOSIS — I6330 Cerebral infarction due to thrombosis of unspecified cerebral artery: Secondary | ICD-10-CM | POA: Diagnosis not present

## 2021-09-13 DIAGNOSIS — I6330 Cerebral infarction due to thrombosis of unspecified cerebral artery: Secondary | ICD-10-CM | POA: Diagnosis not present

## 2021-09-14 DIAGNOSIS — I6330 Cerebral infarction due to thrombosis of unspecified cerebral artery: Secondary | ICD-10-CM | POA: Diagnosis not present

## 2021-09-15 DIAGNOSIS — I6330 Cerebral infarction due to thrombosis of unspecified cerebral artery: Secondary | ICD-10-CM | POA: Diagnosis not present

## 2021-09-16 DIAGNOSIS — I6330 Cerebral infarction due to thrombosis of unspecified cerebral artery: Secondary | ICD-10-CM | POA: Diagnosis not present

## 2021-09-17 DIAGNOSIS — I6330 Cerebral infarction due to thrombosis of unspecified cerebral artery: Secondary | ICD-10-CM | POA: Diagnosis not present

## 2021-09-18 DIAGNOSIS — I6330 Cerebral infarction due to thrombosis of unspecified cerebral artery: Secondary | ICD-10-CM | POA: Diagnosis not present

## 2021-09-19 DIAGNOSIS — I6330 Cerebral infarction due to thrombosis of unspecified cerebral artery: Secondary | ICD-10-CM | POA: Diagnosis not present

## 2021-09-20 DIAGNOSIS — I6330 Cerebral infarction due to thrombosis of unspecified cerebral artery: Secondary | ICD-10-CM | POA: Diagnosis not present

## 2021-09-21 DIAGNOSIS — I6330 Cerebral infarction due to thrombosis of unspecified cerebral artery: Secondary | ICD-10-CM | POA: Diagnosis not present

## 2021-09-22 DIAGNOSIS — I6330 Cerebral infarction due to thrombosis of unspecified cerebral artery: Secondary | ICD-10-CM | POA: Diagnosis not present

## 2021-09-23 DIAGNOSIS — I6330 Cerebral infarction due to thrombosis of unspecified cerebral artery: Secondary | ICD-10-CM | POA: Diagnosis not present

## 2021-09-24 DIAGNOSIS — I6330 Cerebral infarction due to thrombosis of unspecified cerebral artery: Secondary | ICD-10-CM | POA: Diagnosis not present

## 2021-09-25 DIAGNOSIS — I6330 Cerebral infarction due to thrombosis of unspecified cerebral artery: Secondary | ICD-10-CM | POA: Diagnosis not present

## 2021-09-26 DIAGNOSIS — I6330 Cerebral infarction due to thrombosis of unspecified cerebral artery: Secondary | ICD-10-CM | POA: Diagnosis not present

## 2021-09-27 DIAGNOSIS — I6330 Cerebral infarction due to thrombosis of unspecified cerebral artery: Secondary | ICD-10-CM | POA: Diagnosis not present

## 2021-09-28 DIAGNOSIS — I6330 Cerebral infarction due to thrombosis of unspecified cerebral artery: Secondary | ICD-10-CM | POA: Diagnosis not present

## 2021-09-29 DIAGNOSIS — I6330 Cerebral infarction due to thrombosis of unspecified cerebral artery: Secondary | ICD-10-CM | POA: Diagnosis not present

## 2021-09-30 DIAGNOSIS — I6330 Cerebral infarction due to thrombosis of unspecified cerebral artery: Secondary | ICD-10-CM | POA: Diagnosis not present

## 2021-10-01 DIAGNOSIS — I6330 Cerebral infarction due to thrombosis of unspecified cerebral artery: Secondary | ICD-10-CM | POA: Diagnosis not present

## 2021-10-01 DIAGNOSIS — F419 Anxiety disorder, unspecified: Secondary | ICD-10-CM | POA: Diagnosis not present

## 2021-10-02 DIAGNOSIS — I6330 Cerebral infarction due to thrombosis of unspecified cerebral artery: Secondary | ICD-10-CM | POA: Diagnosis not present

## 2021-10-03 DIAGNOSIS — I6330 Cerebral infarction due to thrombosis of unspecified cerebral artery: Secondary | ICD-10-CM | POA: Diagnosis not present

## 2021-10-03 DIAGNOSIS — I10 Essential (primary) hypertension: Secondary | ICD-10-CM | POA: Diagnosis not present

## 2021-10-04 DIAGNOSIS — I6330 Cerebral infarction due to thrombosis of unspecified cerebral artery: Secondary | ICD-10-CM | POA: Diagnosis not present

## 2021-10-05 DIAGNOSIS — I6330 Cerebral infarction due to thrombosis of unspecified cerebral artery: Secondary | ICD-10-CM | POA: Diagnosis not present

## 2021-10-06 DIAGNOSIS — I6330 Cerebral infarction due to thrombosis of unspecified cerebral artery: Secondary | ICD-10-CM | POA: Diagnosis not present

## 2021-10-07 DIAGNOSIS — G629 Polyneuropathy, unspecified: Secondary | ICD-10-CM | POA: Diagnosis not present

## 2021-10-07 DIAGNOSIS — E559 Vitamin D deficiency, unspecified: Secondary | ICD-10-CM | POA: Diagnosis not present

## 2021-10-07 DIAGNOSIS — G4700 Insomnia, unspecified: Secondary | ICD-10-CM | POA: Diagnosis not present

## 2021-10-07 DIAGNOSIS — I6330 Cerebral infarction due to thrombosis of unspecified cerebral artery: Secondary | ICD-10-CM | POA: Diagnosis not present

## 2021-10-07 DIAGNOSIS — R5381 Other malaise: Secondary | ICD-10-CM | POA: Diagnosis not present

## 2021-10-07 DIAGNOSIS — G8929 Other chronic pain: Secondary | ICD-10-CM | POA: Diagnosis not present

## 2021-10-07 DIAGNOSIS — E46 Unspecified protein-calorie malnutrition: Secondary | ICD-10-CM | POA: Diagnosis not present

## 2021-10-07 DIAGNOSIS — E785 Hyperlipidemia, unspecified: Secondary | ICD-10-CM | POA: Diagnosis not present

## 2021-10-08 DIAGNOSIS — I6330 Cerebral infarction due to thrombosis of unspecified cerebral artery: Secondary | ICD-10-CM | POA: Diagnosis not present

## 2021-10-08 DIAGNOSIS — Z419 Encounter for procedure for purposes other than remedying health state, unspecified: Secondary | ICD-10-CM | POA: Diagnosis not present

## 2021-10-09 DIAGNOSIS — I6330 Cerebral infarction due to thrombosis of unspecified cerebral artery: Secondary | ICD-10-CM | POA: Diagnosis not present

## 2021-10-10 DIAGNOSIS — I6330 Cerebral infarction due to thrombosis of unspecified cerebral artery: Secondary | ICD-10-CM | POA: Diagnosis not present

## 2021-10-11 DIAGNOSIS — I6330 Cerebral infarction due to thrombosis of unspecified cerebral artery: Secondary | ICD-10-CM | POA: Diagnosis not present

## 2021-10-12 DIAGNOSIS — I6330 Cerebral infarction due to thrombosis of unspecified cerebral artery: Secondary | ICD-10-CM | POA: Diagnosis not present

## 2021-10-13 DIAGNOSIS — I6330 Cerebral infarction due to thrombosis of unspecified cerebral artery: Secondary | ICD-10-CM | POA: Diagnosis not present

## 2021-10-14 DIAGNOSIS — I6330 Cerebral infarction due to thrombosis of unspecified cerebral artery: Secondary | ICD-10-CM | POA: Diagnosis not present

## 2021-10-15 DIAGNOSIS — I6330 Cerebral infarction due to thrombosis of unspecified cerebral artery: Secondary | ICD-10-CM | POA: Diagnosis not present

## 2021-10-16 DIAGNOSIS — I6330 Cerebral infarction due to thrombosis of unspecified cerebral artery: Secondary | ICD-10-CM | POA: Diagnosis not present

## 2021-10-17 DIAGNOSIS — I6330 Cerebral infarction due to thrombosis of unspecified cerebral artery: Secondary | ICD-10-CM | POA: Diagnosis not present

## 2021-10-18 DIAGNOSIS — I6330 Cerebral infarction due to thrombosis of unspecified cerebral artery: Secondary | ICD-10-CM | POA: Diagnosis not present

## 2021-10-19 DIAGNOSIS — I6330 Cerebral infarction due to thrombosis of unspecified cerebral artery: Secondary | ICD-10-CM | POA: Diagnosis not present

## 2021-10-20 DIAGNOSIS — E038 Other specified hypothyroidism: Secondary | ICD-10-CM | POA: Diagnosis not present

## 2021-10-20 DIAGNOSIS — D518 Other vitamin B12 deficiency anemias: Secondary | ICD-10-CM | POA: Diagnosis not present

## 2021-10-20 DIAGNOSIS — E119 Type 2 diabetes mellitus without complications: Secondary | ICD-10-CM | POA: Diagnosis not present

## 2021-10-20 DIAGNOSIS — E7849 Other hyperlipidemia: Secondary | ICD-10-CM | POA: Diagnosis not present

## 2021-10-20 DIAGNOSIS — E559 Vitamin D deficiency, unspecified: Secondary | ICD-10-CM | POA: Diagnosis not present

## 2021-10-20 DIAGNOSIS — I6330 Cerebral infarction due to thrombosis of unspecified cerebral artery: Secondary | ICD-10-CM | POA: Diagnosis not present

## 2021-10-20 DIAGNOSIS — Z79899 Other long term (current) drug therapy: Secondary | ICD-10-CM | POA: Diagnosis not present

## 2021-10-21 DIAGNOSIS — I6330 Cerebral infarction due to thrombosis of unspecified cerebral artery: Secondary | ICD-10-CM | POA: Diagnosis not present

## 2021-10-22 DIAGNOSIS — I6330 Cerebral infarction due to thrombosis of unspecified cerebral artery: Secondary | ICD-10-CM | POA: Diagnosis not present

## 2021-10-23 DIAGNOSIS — I6330 Cerebral infarction due to thrombosis of unspecified cerebral artery: Secondary | ICD-10-CM | POA: Diagnosis not present

## 2021-10-24 DIAGNOSIS — I6330 Cerebral infarction due to thrombosis of unspecified cerebral artery: Secondary | ICD-10-CM | POA: Diagnosis not present

## 2021-10-25 DIAGNOSIS — I6330 Cerebral infarction due to thrombosis of unspecified cerebral artery: Secondary | ICD-10-CM | POA: Diagnosis not present

## 2021-10-26 DIAGNOSIS — I6330 Cerebral infarction due to thrombosis of unspecified cerebral artery: Secondary | ICD-10-CM | POA: Diagnosis not present

## 2021-10-27 DIAGNOSIS — I6330 Cerebral infarction due to thrombosis of unspecified cerebral artery: Secondary | ICD-10-CM | POA: Diagnosis not present

## 2021-10-28 DIAGNOSIS — I6330 Cerebral infarction due to thrombosis of unspecified cerebral artery: Secondary | ICD-10-CM | POA: Diagnosis not present

## 2021-10-29 DIAGNOSIS — I6330 Cerebral infarction due to thrombosis of unspecified cerebral artery: Secondary | ICD-10-CM | POA: Diagnosis not present

## 2021-10-30 DIAGNOSIS — I6330 Cerebral infarction due to thrombosis of unspecified cerebral artery: Secondary | ICD-10-CM | POA: Diagnosis not present

## 2021-10-31 DIAGNOSIS — I6330 Cerebral infarction due to thrombosis of unspecified cerebral artery: Secondary | ICD-10-CM | POA: Diagnosis not present

## 2021-11-01 DIAGNOSIS — I6330 Cerebral infarction due to thrombosis of unspecified cerebral artery: Secondary | ICD-10-CM | POA: Diagnosis not present

## 2021-11-02 DIAGNOSIS — I6330 Cerebral infarction due to thrombosis of unspecified cerebral artery: Secondary | ICD-10-CM | POA: Diagnosis not present

## 2021-11-03 DIAGNOSIS — I10 Essential (primary) hypertension: Secondary | ICD-10-CM | POA: Diagnosis not present

## 2021-11-03 DIAGNOSIS — I6330 Cerebral infarction due to thrombosis of unspecified cerebral artery: Secondary | ICD-10-CM | POA: Diagnosis not present

## 2021-11-04 DIAGNOSIS — J439 Emphysema, unspecified: Secondary | ICD-10-CM | POA: Diagnosis not present

## 2021-11-04 DIAGNOSIS — G629 Polyneuropathy, unspecified: Secondary | ICD-10-CM | POA: Diagnosis not present

## 2021-11-04 DIAGNOSIS — I6330 Cerebral infarction due to thrombosis of unspecified cerebral artery: Secondary | ICD-10-CM | POA: Diagnosis not present

## 2021-11-04 DIAGNOSIS — I10 Essential (primary) hypertension: Secondary | ICD-10-CM | POA: Diagnosis not present

## 2021-11-04 DIAGNOSIS — E559 Vitamin D deficiency, unspecified: Secondary | ICD-10-CM | POA: Diagnosis not present

## 2021-11-04 DIAGNOSIS — E785 Hyperlipidemia, unspecified: Secondary | ICD-10-CM | POA: Diagnosis not present

## 2021-11-04 DIAGNOSIS — G4700 Insomnia, unspecified: Secondary | ICD-10-CM | POA: Diagnosis not present

## 2021-11-04 DIAGNOSIS — I739 Peripheral vascular disease, unspecified: Secondary | ICD-10-CM | POA: Diagnosis not present

## 2021-11-04 DIAGNOSIS — K5900 Constipation, unspecified: Secondary | ICD-10-CM | POA: Diagnosis not present

## 2021-11-04 DIAGNOSIS — G8929 Other chronic pain: Secondary | ICD-10-CM | POA: Diagnosis not present

## 2021-11-04 DIAGNOSIS — N400 Enlarged prostate without lower urinary tract symptoms: Secondary | ICD-10-CM | POA: Diagnosis not present

## 2021-11-05 DIAGNOSIS — Z419 Encounter for procedure for purposes other than remedying health state, unspecified: Secondary | ICD-10-CM | POA: Diagnosis not present

## 2021-11-05 DIAGNOSIS — I6330 Cerebral infarction due to thrombosis of unspecified cerebral artery: Secondary | ICD-10-CM | POA: Diagnosis not present

## 2021-11-06 DIAGNOSIS — I6330 Cerebral infarction due to thrombosis of unspecified cerebral artery: Secondary | ICD-10-CM | POA: Diagnosis not present

## 2021-11-07 DIAGNOSIS — I6330 Cerebral infarction due to thrombosis of unspecified cerebral artery: Secondary | ICD-10-CM | POA: Diagnosis not present

## 2021-11-08 DIAGNOSIS — I6330 Cerebral infarction due to thrombosis of unspecified cerebral artery: Secondary | ICD-10-CM | POA: Diagnosis not present

## 2021-11-09 DIAGNOSIS — I6330 Cerebral infarction due to thrombosis of unspecified cerebral artery: Secondary | ICD-10-CM | POA: Diagnosis not present

## 2021-11-10 DIAGNOSIS — I6330 Cerebral infarction due to thrombosis of unspecified cerebral artery: Secondary | ICD-10-CM | POA: Diagnosis not present

## 2021-11-11 DIAGNOSIS — I6330 Cerebral infarction due to thrombosis of unspecified cerebral artery: Secondary | ICD-10-CM | POA: Diagnosis not present

## 2021-11-12 DIAGNOSIS — I6330 Cerebral infarction due to thrombosis of unspecified cerebral artery: Secondary | ICD-10-CM | POA: Diagnosis not present

## 2021-11-13 DIAGNOSIS — I6330 Cerebral infarction due to thrombosis of unspecified cerebral artery: Secondary | ICD-10-CM | POA: Diagnosis not present

## 2021-11-14 DIAGNOSIS — I6330 Cerebral infarction due to thrombosis of unspecified cerebral artery: Secondary | ICD-10-CM | POA: Diagnosis not present

## 2021-11-15 DIAGNOSIS — I6330 Cerebral infarction due to thrombosis of unspecified cerebral artery: Secondary | ICD-10-CM | POA: Diagnosis not present

## 2021-11-16 DIAGNOSIS — I6330 Cerebral infarction due to thrombosis of unspecified cerebral artery: Secondary | ICD-10-CM | POA: Diagnosis not present

## 2021-11-17 DIAGNOSIS — I6330 Cerebral infarction due to thrombosis of unspecified cerebral artery: Secondary | ICD-10-CM | POA: Diagnosis not present

## 2021-11-18 DIAGNOSIS — I6330 Cerebral infarction due to thrombosis of unspecified cerebral artery: Secondary | ICD-10-CM | POA: Diagnosis not present

## 2021-11-19 DIAGNOSIS — I6330 Cerebral infarction due to thrombosis of unspecified cerebral artery: Secondary | ICD-10-CM | POA: Diagnosis not present

## 2021-11-20 DIAGNOSIS — I6330 Cerebral infarction due to thrombosis of unspecified cerebral artery: Secondary | ICD-10-CM | POA: Diagnosis not present

## 2021-11-21 DIAGNOSIS — I6330 Cerebral infarction due to thrombosis of unspecified cerebral artery: Secondary | ICD-10-CM | POA: Diagnosis not present

## 2021-11-22 DIAGNOSIS — I6330 Cerebral infarction due to thrombosis of unspecified cerebral artery: Secondary | ICD-10-CM | POA: Diagnosis not present

## 2021-11-23 DIAGNOSIS — I6330 Cerebral infarction due to thrombosis of unspecified cerebral artery: Secondary | ICD-10-CM | POA: Diagnosis not present

## 2021-11-24 DIAGNOSIS — I6330 Cerebral infarction due to thrombosis of unspecified cerebral artery: Secondary | ICD-10-CM | POA: Diagnosis not present

## 2021-11-25 DIAGNOSIS — I6330 Cerebral infarction due to thrombosis of unspecified cerebral artery: Secondary | ICD-10-CM | POA: Diagnosis not present

## 2021-11-26 DIAGNOSIS — I6330 Cerebral infarction due to thrombosis of unspecified cerebral artery: Secondary | ICD-10-CM | POA: Diagnosis not present

## 2021-11-27 DIAGNOSIS — I6330 Cerebral infarction due to thrombosis of unspecified cerebral artery: Secondary | ICD-10-CM | POA: Diagnosis not present

## 2021-11-28 DIAGNOSIS — I6330 Cerebral infarction due to thrombosis of unspecified cerebral artery: Secondary | ICD-10-CM | POA: Diagnosis not present

## 2021-11-29 DIAGNOSIS — I6330 Cerebral infarction due to thrombosis of unspecified cerebral artery: Secondary | ICD-10-CM | POA: Diagnosis not present

## 2021-11-30 DIAGNOSIS — I6330 Cerebral infarction due to thrombosis of unspecified cerebral artery: Secondary | ICD-10-CM | POA: Diagnosis not present

## 2021-12-01 DIAGNOSIS — I6330 Cerebral infarction due to thrombosis of unspecified cerebral artery: Secondary | ICD-10-CM | POA: Diagnosis not present

## 2021-12-01 DIAGNOSIS — I10 Essential (primary) hypertension: Secondary | ICD-10-CM | POA: Diagnosis not present

## 2021-12-02 DIAGNOSIS — I6330 Cerebral infarction due to thrombosis of unspecified cerebral artery: Secondary | ICD-10-CM | POA: Diagnosis not present

## 2021-12-02 DIAGNOSIS — E785 Hyperlipidemia, unspecified: Secondary | ICD-10-CM | POA: Diagnosis not present

## 2021-12-02 DIAGNOSIS — G4700 Insomnia, unspecified: Secondary | ICD-10-CM | POA: Diagnosis not present

## 2021-12-02 DIAGNOSIS — I739 Peripheral vascular disease, unspecified: Secondary | ICD-10-CM | POA: Diagnosis not present

## 2021-12-02 DIAGNOSIS — J430 Unilateral pulmonary emphysema [MacLeod's syndrome]: Secondary | ICD-10-CM | POA: Diagnosis not present

## 2021-12-02 DIAGNOSIS — K5900 Constipation, unspecified: Secondary | ICD-10-CM | POA: Diagnosis not present

## 2021-12-02 DIAGNOSIS — G8929 Other chronic pain: Secondary | ICD-10-CM | POA: Diagnosis not present

## 2021-12-02 DIAGNOSIS — N400 Enlarged prostate without lower urinary tract symptoms: Secondary | ICD-10-CM | POA: Diagnosis not present

## 2021-12-02 DIAGNOSIS — E559 Vitamin D deficiency, unspecified: Secondary | ICD-10-CM | POA: Diagnosis not present

## 2021-12-02 DIAGNOSIS — G629 Polyneuropathy, unspecified: Secondary | ICD-10-CM | POA: Diagnosis not present

## 2021-12-03 DIAGNOSIS — I6330 Cerebral infarction due to thrombosis of unspecified cerebral artery: Secondary | ICD-10-CM | POA: Diagnosis not present

## 2021-12-03 DIAGNOSIS — F419 Anxiety disorder, unspecified: Secondary | ICD-10-CM | POA: Diagnosis not present

## 2021-12-04 DIAGNOSIS — I6330 Cerebral infarction due to thrombosis of unspecified cerebral artery: Secondary | ICD-10-CM | POA: Diagnosis not present

## 2021-12-05 DIAGNOSIS — I6330 Cerebral infarction due to thrombosis of unspecified cerebral artery: Secondary | ICD-10-CM | POA: Diagnosis not present

## 2021-12-06 DIAGNOSIS — I6330 Cerebral infarction due to thrombosis of unspecified cerebral artery: Secondary | ICD-10-CM | POA: Diagnosis not present

## 2021-12-06 DIAGNOSIS — Z419 Encounter for procedure for purposes other than remedying health state, unspecified: Secondary | ICD-10-CM | POA: Diagnosis not present

## 2021-12-07 DIAGNOSIS — I6330 Cerebral infarction due to thrombosis of unspecified cerebral artery: Secondary | ICD-10-CM | POA: Diagnosis not present

## 2021-12-08 DIAGNOSIS — I6330 Cerebral infarction due to thrombosis of unspecified cerebral artery: Secondary | ICD-10-CM | POA: Diagnosis not present

## 2021-12-09 DIAGNOSIS — I6330 Cerebral infarction due to thrombosis of unspecified cerebral artery: Secondary | ICD-10-CM | POA: Diagnosis not present

## 2021-12-10 DIAGNOSIS — I6330 Cerebral infarction due to thrombosis of unspecified cerebral artery: Secondary | ICD-10-CM | POA: Diagnosis not present

## 2021-12-11 DIAGNOSIS — I6330 Cerebral infarction due to thrombosis of unspecified cerebral artery: Secondary | ICD-10-CM | POA: Diagnosis not present

## 2021-12-12 DIAGNOSIS — I6330 Cerebral infarction due to thrombosis of unspecified cerebral artery: Secondary | ICD-10-CM | POA: Diagnosis not present

## 2021-12-13 DIAGNOSIS — I6330 Cerebral infarction due to thrombosis of unspecified cerebral artery: Secondary | ICD-10-CM | POA: Diagnosis not present

## 2021-12-14 DIAGNOSIS — I6330 Cerebral infarction due to thrombosis of unspecified cerebral artery: Secondary | ICD-10-CM | POA: Diagnosis not present

## 2021-12-15 DIAGNOSIS — I6330 Cerebral infarction due to thrombosis of unspecified cerebral artery: Secondary | ICD-10-CM | POA: Diagnosis not present

## 2021-12-16 DIAGNOSIS — I6330 Cerebral infarction due to thrombosis of unspecified cerebral artery: Secondary | ICD-10-CM | POA: Diagnosis not present

## 2021-12-17 DIAGNOSIS — I6330 Cerebral infarction due to thrombosis of unspecified cerebral artery: Secondary | ICD-10-CM | POA: Diagnosis not present

## 2021-12-18 DIAGNOSIS — I6330 Cerebral infarction due to thrombosis of unspecified cerebral artery: Secondary | ICD-10-CM | POA: Diagnosis not present

## 2021-12-19 DIAGNOSIS — I6330 Cerebral infarction due to thrombosis of unspecified cerebral artery: Secondary | ICD-10-CM | POA: Diagnosis not present

## 2021-12-20 DIAGNOSIS — I6330 Cerebral infarction due to thrombosis of unspecified cerebral artery: Secondary | ICD-10-CM | POA: Diagnosis not present

## 2021-12-21 DIAGNOSIS — I6330 Cerebral infarction due to thrombosis of unspecified cerebral artery: Secondary | ICD-10-CM | POA: Diagnosis not present

## 2021-12-22 DIAGNOSIS — I6330 Cerebral infarction due to thrombosis of unspecified cerebral artery: Secondary | ICD-10-CM | POA: Diagnosis not present

## 2021-12-23 DIAGNOSIS — I6330 Cerebral infarction due to thrombosis of unspecified cerebral artery: Secondary | ICD-10-CM | POA: Diagnosis not present

## 2021-12-24 DIAGNOSIS — I6330 Cerebral infarction due to thrombosis of unspecified cerebral artery: Secondary | ICD-10-CM | POA: Diagnosis not present

## 2021-12-25 DIAGNOSIS — I6330 Cerebral infarction due to thrombosis of unspecified cerebral artery: Secondary | ICD-10-CM | POA: Diagnosis not present

## 2021-12-26 DIAGNOSIS — I6330 Cerebral infarction due to thrombosis of unspecified cerebral artery: Secondary | ICD-10-CM | POA: Diagnosis not present

## 2021-12-27 DIAGNOSIS — I6330 Cerebral infarction due to thrombosis of unspecified cerebral artery: Secondary | ICD-10-CM | POA: Diagnosis not present

## 2021-12-28 DIAGNOSIS — I6330 Cerebral infarction due to thrombosis of unspecified cerebral artery: Secondary | ICD-10-CM | POA: Diagnosis not present

## 2021-12-29 DIAGNOSIS — I6330 Cerebral infarction due to thrombosis of unspecified cerebral artery: Secondary | ICD-10-CM | POA: Diagnosis not present

## 2021-12-30 DIAGNOSIS — G4700 Insomnia, unspecified: Secondary | ICD-10-CM | POA: Diagnosis not present

## 2021-12-30 DIAGNOSIS — G8929 Other chronic pain: Secondary | ICD-10-CM | POA: Diagnosis not present

## 2021-12-30 DIAGNOSIS — I739 Peripheral vascular disease, unspecified: Secondary | ICD-10-CM | POA: Diagnosis not present

## 2021-12-30 DIAGNOSIS — E785 Hyperlipidemia, unspecified: Secondary | ICD-10-CM | POA: Diagnosis not present

## 2021-12-30 DIAGNOSIS — J439 Emphysema, unspecified: Secondary | ICD-10-CM | POA: Diagnosis not present

## 2021-12-30 DIAGNOSIS — I10 Essential (primary) hypertension: Secondary | ICD-10-CM | POA: Diagnosis not present

## 2021-12-30 DIAGNOSIS — E559 Vitamin D deficiency, unspecified: Secondary | ICD-10-CM | POA: Diagnosis not present

## 2021-12-30 DIAGNOSIS — K5900 Constipation, unspecified: Secondary | ICD-10-CM | POA: Diagnosis not present

## 2021-12-30 DIAGNOSIS — N400 Enlarged prostate without lower urinary tract symptoms: Secondary | ICD-10-CM | POA: Diagnosis not present

## 2021-12-30 DIAGNOSIS — I6330 Cerebral infarction due to thrombosis of unspecified cerebral artery: Secondary | ICD-10-CM | POA: Diagnosis not present

## 2021-12-30 DIAGNOSIS — R5381 Other malaise: Secondary | ICD-10-CM | POA: Diagnosis not present

## 2021-12-31 DIAGNOSIS — I6330 Cerebral infarction due to thrombosis of unspecified cerebral artery: Secondary | ICD-10-CM | POA: Diagnosis not present

## 2021-12-31 DIAGNOSIS — F419 Anxiety disorder, unspecified: Secondary | ICD-10-CM | POA: Diagnosis not present

## 2022-01-01 DIAGNOSIS — I10 Essential (primary) hypertension: Secondary | ICD-10-CM | POA: Diagnosis not present

## 2022-01-01 DIAGNOSIS — I6330 Cerebral infarction due to thrombosis of unspecified cerebral artery: Secondary | ICD-10-CM | POA: Diagnosis not present

## 2022-01-02 DIAGNOSIS — I6330 Cerebral infarction due to thrombosis of unspecified cerebral artery: Secondary | ICD-10-CM | POA: Diagnosis not present

## 2022-01-03 DIAGNOSIS — I6330 Cerebral infarction due to thrombosis of unspecified cerebral artery: Secondary | ICD-10-CM | POA: Diagnosis not present

## 2022-01-04 DIAGNOSIS — I6330 Cerebral infarction due to thrombosis of unspecified cerebral artery: Secondary | ICD-10-CM | POA: Diagnosis not present

## 2022-01-05 DIAGNOSIS — I6330 Cerebral infarction due to thrombosis of unspecified cerebral artery: Secondary | ICD-10-CM | POA: Diagnosis not present

## 2022-01-05 DIAGNOSIS — Z419 Encounter for procedure for purposes other than remedying health state, unspecified: Secondary | ICD-10-CM | POA: Diagnosis not present

## 2022-01-06 DIAGNOSIS — I6330 Cerebral infarction due to thrombosis of unspecified cerebral artery: Secondary | ICD-10-CM | POA: Diagnosis not present

## 2022-01-07 DIAGNOSIS — I6330 Cerebral infarction due to thrombosis of unspecified cerebral artery: Secondary | ICD-10-CM | POA: Diagnosis not present

## 2022-01-08 DIAGNOSIS — I6330 Cerebral infarction due to thrombosis of unspecified cerebral artery: Secondary | ICD-10-CM | POA: Diagnosis not present

## 2022-01-09 DIAGNOSIS — I6330 Cerebral infarction due to thrombosis of unspecified cerebral artery: Secondary | ICD-10-CM | POA: Diagnosis not present

## 2022-01-10 DIAGNOSIS — I6330 Cerebral infarction due to thrombosis of unspecified cerebral artery: Secondary | ICD-10-CM | POA: Diagnosis not present

## 2022-01-11 DIAGNOSIS — I6330 Cerebral infarction due to thrombosis of unspecified cerebral artery: Secondary | ICD-10-CM | POA: Diagnosis not present

## 2022-01-12 DIAGNOSIS — I6330 Cerebral infarction due to thrombosis of unspecified cerebral artery: Secondary | ICD-10-CM | POA: Diagnosis not present

## 2022-01-13 DIAGNOSIS — I6330 Cerebral infarction due to thrombosis of unspecified cerebral artery: Secondary | ICD-10-CM | POA: Diagnosis not present

## 2022-01-14 DIAGNOSIS — I6330 Cerebral infarction due to thrombosis of unspecified cerebral artery: Secondary | ICD-10-CM | POA: Diagnosis not present

## 2022-01-15 DIAGNOSIS — I6330 Cerebral infarction due to thrombosis of unspecified cerebral artery: Secondary | ICD-10-CM | POA: Diagnosis not present

## 2022-01-16 DIAGNOSIS — I6330 Cerebral infarction due to thrombosis of unspecified cerebral artery: Secondary | ICD-10-CM | POA: Diagnosis not present

## 2022-01-17 DIAGNOSIS — I6330 Cerebral infarction due to thrombosis of unspecified cerebral artery: Secondary | ICD-10-CM | POA: Diagnosis not present

## 2022-01-18 DIAGNOSIS — I6330 Cerebral infarction due to thrombosis of unspecified cerebral artery: Secondary | ICD-10-CM | POA: Diagnosis not present

## 2022-01-19 DIAGNOSIS — I6330 Cerebral infarction due to thrombosis of unspecified cerebral artery: Secondary | ICD-10-CM | POA: Diagnosis not present

## 2022-01-20 DIAGNOSIS — I6330 Cerebral infarction due to thrombosis of unspecified cerebral artery: Secondary | ICD-10-CM | POA: Diagnosis not present

## 2022-01-21 DIAGNOSIS — I6330 Cerebral infarction due to thrombosis of unspecified cerebral artery: Secondary | ICD-10-CM | POA: Diagnosis not present

## 2022-01-22 DIAGNOSIS — I6330 Cerebral infarction due to thrombosis of unspecified cerebral artery: Secondary | ICD-10-CM | POA: Diagnosis not present

## 2022-01-23 DIAGNOSIS — I6330 Cerebral infarction due to thrombosis of unspecified cerebral artery: Secondary | ICD-10-CM | POA: Diagnosis not present

## 2022-01-24 DIAGNOSIS — I6330 Cerebral infarction due to thrombosis of unspecified cerebral artery: Secondary | ICD-10-CM | POA: Diagnosis not present

## 2022-01-25 DIAGNOSIS — I6330 Cerebral infarction due to thrombosis of unspecified cerebral artery: Secondary | ICD-10-CM | POA: Diagnosis not present

## 2022-01-26 DIAGNOSIS — I6330 Cerebral infarction due to thrombosis of unspecified cerebral artery: Secondary | ICD-10-CM | POA: Diagnosis not present

## 2022-01-27 DIAGNOSIS — N400 Enlarged prostate without lower urinary tract symptoms: Secondary | ICD-10-CM | POA: Diagnosis not present

## 2022-01-27 DIAGNOSIS — I739 Peripheral vascular disease, unspecified: Secondary | ICD-10-CM | POA: Diagnosis not present

## 2022-01-27 DIAGNOSIS — G629 Polyneuropathy, unspecified: Secondary | ICD-10-CM | POA: Diagnosis not present

## 2022-01-27 DIAGNOSIS — I6330 Cerebral infarction due to thrombosis of unspecified cerebral artery: Secondary | ICD-10-CM | POA: Diagnosis not present

## 2022-01-27 DIAGNOSIS — G8929 Other chronic pain: Secondary | ICD-10-CM | POA: Diagnosis not present

## 2022-01-27 DIAGNOSIS — K5900 Constipation, unspecified: Secondary | ICD-10-CM | POA: Diagnosis not present

## 2022-01-27 DIAGNOSIS — I10 Essential (primary) hypertension: Secondary | ICD-10-CM | POA: Diagnosis not present

## 2022-01-27 DIAGNOSIS — E559 Vitamin D deficiency, unspecified: Secondary | ICD-10-CM | POA: Diagnosis not present

## 2022-01-27 DIAGNOSIS — E785 Hyperlipidemia, unspecified: Secondary | ICD-10-CM | POA: Diagnosis not present

## 2022-01-27 DIAGNOSIS — R5381 Other malaise: Secondary | ICD-10-CM | POA: Diagnosis not present

## 2022-01-28 DIAGNOSIS — F419 Anxiety disorder, unspecified: Secondary | ICD-10-CM | POA: Diagnosis not present

## 2022-01-28 DIAGNOSIS — I6330 Cerebral infarction due to thrombosis of unspecified cerebral artery: Secondary | ICD-10-CM | POA: Diagnosis not present

## 2022-01-29 DIAGNOSIS — I6330 Cerebral infarction due to thrombosis of unspecified cerebral artery: Secondary | ICD-10-CM | POA: Diagnosis not present

## 2022-01-30 DIAGNOSIS — I6330 Cerebral infarction due to thrombosis of unspecified cerebral artery: Secondary | ICD-10-CM | POA: Diagnosis not present

## 2022-01-31 DIAGNOSIS — I10 Essential (primary) hypertension: Secondary | ICD-10-CM | POA: Diagnosis not present

## 2022-01-31 DIAGNOSIS — I6330 Cerebral infarction due to thrombosis of unspecified cerebral artery: Secondary | ICD-10-CM | POA: Diagnosis not present

## 2022-02-01 DIAGNOSIS — I6330 Cerebral infarction due to thrombosis of unspecified cerebral artery: Secondary | ICD-10-CM | POA: Diagnosis not present

## 2022-02-02 DIAGNOSIS — I6330 Cerebral infarction due to thrombosis of unspecified cerebral artery: Secondary | ICD-10-CM | POA: Diagnosis not present

## 2022-02-03 DIAGNOSIS — I6330 Cerebral infarction due to thrombosis of unspecified cerebral artery: Secondary | ICD-10-CM | POA: Diagnosis not present

## 2022-02-04 DIAGNOSIS — I6330 Cerebral infarction due to thrombosis of unspecified cerebral artery: Secondary | ICD-10-CM | POA: Diagnosis not present

## 2022-02-05 DIAGNOSIS — Z419 Encounter for procedure for purposes other than remedying health state, unspecified: Secondary | ICD-10-CM | POA: Diagnosis not present

## 2022-02-05 DIAGNOSIS — I6330 Cerebral infarction due to thrombosis of unspecified cerebral artery: Secondary | ICD-10-CM | POA: Diagnosis not present

## 2022-02-06 DIAGNOSIS — I6330 Cerebral infarction due to thrombosis of unspecified cerebral artery: Secondary | ICD-10-CM | POA: Diagnosis not present

## 2022-02-07 DIAGNOSIS — I6330 Cerebral infarction due to thrombosis of unspecified cerebral artery: Secondary | ICD-10-CM | POA: Diagnosis not present

## 2022-02-08 DIAGNOSIS — I6330 Cerebral infarction due to thrombosis of unspecified cerebral artery: Secondary | ICD-10-CM | POA: Diagnosis not present

## 2022-02-09 DIAGNOSIS — I6330 Cerebral infarction due to thrombosis of unspecified cerebral artery: Secondary | ICD-10-CM | POA: Diagnosis not present

## 2022-02-10 DIAGNOSIS — I6330 Cerebral infarction due to thrombosis of unspecified cerebral artery: Secondary | ICD-10-CM | POA: Diagnosis not present

## 2022-02-11 DIAGNOSIS — I6330 Cerebral infarction due to thrombosis of unspecified cerebral artery: Secondary | ICD-10-CM | POA: Diagnosis not present

## 2022-02-12 DIAGNOSIS — I6330 Cerebral infarction due to thrombosis of unspecified cerebral artery: Secondary | ICD-10-CM | POA: Diagnosis not present

## 2022-02-13 DIAGNOSIS — I6330 Cerebral infarction due to thrombosis of unspecified cerebral artery: Secondary | ICD-10-CM | POA: Diagnosis not present

## 2022-02-14 DIAGNOSIS — I6330 Cerebral infarction due to thrombosis of unspecified cerebral artery: Secondary | ICD-10-CM | POA: Diagnosis not present

## 2022-02-15 DIAGNOSIS — I6330 Cerebral infarction due to thrombosis of unspecified cerebral artery: Secondary | ICD-10-CM | POA: Diagnosis not present

## 2022-02-16 DIAGNOSIS — I6330 Cerebral infarction due to thrombosis of unspecified cerebral artery: Secondary | ICD-10-CM | POA: Diagnosis not present

## 2022-02-17 DIAGNOSIS — I6330 Cerebral infarction due to thrombosis of unspecified cerebral artery: Secondary | ICD-10-CM | POA: Diagnosis not present

## 2022-02-18 DIAGNOSIS — F419 Anxiety disorder, unspecified: Secondary | ICD-10-CM | POA: Diagnosis not present

## 2022-02-18 DIAGNOSIS — I6330 Cerebral infarction due to thrombosis of unspecified cerebral artery: Secondary | ICD-10-CM | POA: Diagnosis not present

## 2022-02-19 DIAGNOSIS — I6330 Cerebral infarction due to thrombosis of unspecified cerebral artery: Secondary | ICD-10-CM | POA: Diagnosis not present

## 2022-02-20 DIAGNOSIS — I6330 Cerebral infarction due to thrombosis of unspecified cerebral artery: Secondary | ICD-10-CM | POA: Diagnosis not present

## 2022-02-21 DIAGNOSIS — I6330 Cerebral infarction due to thrombosis of unspecified cerebral artery: Secondary | ICD-10-CM | POA: Diagnosis not present

## 2022-02-22 DIAGNOSIS — I6330 Cerebral infarction due to thrombosis of unspecified cerebral artery: Secondary | ICD-10-CM | POA: Diagnosis not present

## 2022-02-23 DIAGNOSIS — I6330 Cerebral infarction due to thrombosis of unspecified cerebral artery: Secondary | ICD-10-CM | POA: Diagnosis not present

## 2022-02-24 DIAGNOSIS — I6330 Cerebral infarction due to thrombosis of unspecified cerebral artery: Secondary | ICD-10-CM | POA: Diagnosis not present

## 2022-02-25 DIAGNOSIS — I6330 Cerebral infarction due to thrombosis of unspecified cerebral artery: Secondary | ICD-10-CM | POA: Diagnosis not present

## 2022-02-26 DIAGNOSIS — I6330 Cerebral infarction due to thrombosis of unspecified cerebral artery: Secondary | ICD-10-CM | POA: Diagnosis not present

## 2022-02-27 DIAGNOSIS — I6330 Cerebral infarction due to thrombosis of unspecified cerebral artery: Secondary | ICD-10-CM | POA: Diagnosis not present

## 2022-02-28 DIAGNOSIS — I6330 Cerebral infarction due to thrombosis of unspecified cerebral artery: Secondary | ICD-10-CM | POA: Diagnosis not present

## 2022-03-01 DIAGNOSIS — I6330 Cerebral infarction due to thrombosis of unspecified cerebral artery: Secondary | ICD-10-CM | POA: Diagnosis not present

## 2022-03-02 DIAGNOSIS — I6330 Cerebral infarction due to thrombosis of unspecified cerebral artery: Secondary | ICD-10-CM | POA: Diagnosis not present

## 2022-03-03 DIAGNOSIS — E46 Unspecified protein-calorie malnutrition: Secondary | ICD-10-CM | POA: Diagnosis not present

## 2022-03-03 DIAGNOSIS — G629 Polyneuropathy, unspecified: Secondary | ICD-10-CM | POA: Diagnosis not present

## 2022-03-03 DIAGNOSIS — R5381 Other malaise: Secondary | ICD-10-CM | POA: Diagnosis not present

## 2022-03-03 DIAGNOSIS — K5900 Constipation, unspecified: Secondary | ICD-10-CM | POA: Diagnosis not present

## 2022-03-03 DIAGNOSIS — E559 Vitamin D deficiency, unspecified: Secondary | ICD-10-CM | POA: Diagnosis not present

## 2022-03-03 DIAGNOSIS — U071 COVID-19: Secondary | ICD-10-CM | POA: Diagnosis not present

## 2022-03-03 DIAGNOSIS — N400 Enlarged prostate without lower urinary tract symptoms: Secondary | ICD-10-CM | POA: Diagnosis not present

## 2022-03-03 DIAGNOSIS — I10 Essential (primary) hypertension: Secondary | ICD-10-CM | POA: Diagnosis not present

## 2022-03-03 DIAGNOSIS — I6330 Cerebral infarction due to thrombosis of unspecified cerebral artery: Secondary | ICD-10-CM | POA: Diagnosis not present

## 2022-03-03 DIAGNOSIS — J439 Emphysema, unspecified: Secondary | ICD-10-CM | POA: Diagnosis not present

## 2022-03-03 DIAGNOSIS — E785 Hyperlipidemia, unspecified: Secondary | ICD-10-CM | POA: Diagnosis not present

## 2022-03-04 DIAGNOSIS — I6330 Cerebral infarction due to thrombosis of unspecified cerebral artery: Secondary | ICD-10-CM | POA: Diagnosis not present

## 2022-03-04 DIAGNOSIS — I10 Essential (primary) hypertension: Secondary | ICD-10-CM | POA: Diagnosis not present

## 2022-03-05 DIAGNOSIS — I6330 Cerebral infarction due to thrombosis of unspecified cerebral artery: Secondary | ICD-10-CM | POA: Diagnosis not present

## 2022-03-06 DIAGNOSIS — I6330 Cerebral infarction due to thrombosis of unspecified cerebral artery: Secondary | ICD-10-CM | POA: Diagnosis not present

## 2022-03-07 DIAGNOSIS — Z419 Encounter for procedure for purposes other than remedying health state, unspecified: Secondary | ICD-10-CM | POA: Diagnosis not present

## 2022-03-07 DIAGNOSIS — I6330 Cerebral infarction due to thrombosis of unspecified cerebral artery: Secondary | ICD-10-CM | POA: Diagnosis not present

## 2022-03-08 DIAGNOSIS — I6330 Cerebral infarction due to thrombosis of unspecified cerebral artery: Secondary | ICD-10-CM | POA: Diagnosis not present

## 2022-03-09 DIAGNOSIS — I6330 Cerebral infarction due to thrombosis of unspecified cerebral artery: Secondary | ICD-10-CM | POA: Diagnosis not present

## 2022-03-10 DIAGNOSIS — I6330 Cerebral infarction due to thrombosis of unspecified cerebral artery: Secondary | ICD-10-CM | POA: Diagnosis not present

## 2022-03-11 DIAGNOSIS — I6330 Cerebral infarction due to thrombosis of unspecified cerebral artery: Secondary | ICD-10-CM | POA: Diagnosis not present

## 2022-03-12 DIAGNOSIS — I6330 Cerebral infarction due to thrombosis of unspecified cerebral artery: Secondary | ICD-10-CM | POA: Diagnosis not present

## 2022-03-13 DIAGNOSIS — I6330 Cerebral infarction due to thrombosis of unspecified cerebral artery: Secondary | ICD-10-CM | POA: Diagnosis not present

## 2022-03-14 DIAGNOSIS — I6330 Cerebral infarction due to thrombosis of unspecified cerebral artery: Secondary | ICD-10-CM | POA: Diagnosis not present

## 2022-03-15 DIAGNOSIS — I6330 Cerebral infarction due to thrombosis of unspecified cerebral artery: Secondary | ICD-10-CM | POA: Diagnosis not present

## 2022-03-16 DIAGNOSIS — I6330 Cerebral infarction due to thrombosis of unspecified cerebral artery: Secondary | ICD-10-CM | POA: Diagnosis not present

## 2022-03-17 DIAGNOSIS — I6330 Cerebral infarction due to thrombosis of unspecified cerebral artery: Secondary | ICD-10-CM | POA: Diagnosis not present

## 2022-03-18 DIAGNOSIS — I6330 Cerebral infarction due to thrombosis of unspecified cerebral artery: Secondary | ICD-10-CM | POA: Diagnosis not present

## 2022-03-19 DIAGNOSIS — I6330 Cerebral infarction due to thrombosis of unspecified cerebral artery: Secondary | ICD-10-CM | POA: Diagnosis not present

## 2022-03-20 DIAGNOSIS — I6330 Cerebral infarction due to thrombosis of unspecified cerebral artery: Secondary | ICD-10-CM | POA: Diagnosis not present

## 2022-03-21 DIAGNOSIS — I6330 Cerebral infarction due to thrombosis of unspecified cerebral artery: Secondary | ICD-10-CM | POA: Diagnosis not present

## 2022-03-22 DIAGNOSIS — I6330 Cerebral infarction due to thrombosis of unspecified cerebral artery: Secondary | ICD-10-CM | POA: Diagnosis not present

## 2022-03-23 DIAGNOSIS — F419 Anxiety disorder, unspecified: Secondary | ICD-10-CM | POA: Diagnosis not present

## 2022-03-23 DIAGNOSIS — I6330 Cerebral infarction due to thrombosis of unspecified cerebral artery: Secondary | ICD-10-CM | POA: Diagnosis not present

## 2022-03-24 DIAGNOSIS — I6330 Cerebral infarction due to thrombosis of unspecified cerebral artery: Secondary | ICD-10-CM | POA: Diagnosis not present

## 2022-03-25 DIAGNOSIS — I6330 Cerebral infarction due to thrombosis of unspecified cerebral artery: Secondary | ICD-10-CM | POA: Diagnosis not present

## 2022-03-26 DIAGNOSIS — I6330 Cerebral infarction due to thrombosis of unspecified cerebral artery: Secondary | ICD-10-CM | POA: Diagnosis not present

## 2022-03-27 DIAGNOSIS — I6330 Cerebral infarction due to thrombosis of unspecified cerebral artery: Secondary | ICD-10-CM | POA: Diagnosis not present

## 2022-03-28 DIAGNOSIS — I6330 Cerebral infarction due to thrombosis of unspecified cerebral artery: Secondary | ICD-10-CM | POA: Diagnosis not present

## 2022-03-29 DIAGNOSIS — I6330 Cerebral infarction due to thrombosis of unspecified cerebral artery: Secondary | ICD-10-CM | POA: Diagnosis not present

## 2022-03-30 DIAGNOSIS — I6330 Cerebral infarction due to thrombosis of unspecified cerebral artery: Secondary | ICD-10-CM | POA: Diagnosis not present

## 2022-03-31 DIAGNOSIS — G8929 Other chronic pain: Secondary | ICD-10-CM | POA: Diagnosis not present

## 2022-03-31 DIAGNOSIS — G629 Polyneuropathy, unspecified: Secondary | ICD-10-CM | POA: Diagnosis not present

## 2022-03-31 DIAGNOSIS — J439 Emphysema, unspecified: Secondary | ICD-10-CM | POA: Diagnosis not present

## 2022-03-31 DIAGNOSIS — I6330 Cerebral infarction due to thrombosis of unspecified cerebral artery: Secondary | ICD-10-CM | POA: Diagnosis not present

## 2022-03-31 DIAGNOSIS — N400 Enlarged prostate without lower urinary tract symptoms: Secondary | ICD-10-CM | POA: Diagnosis not present

## 2022-03-31 DIAGNOSIS — U071 COVID-19: Secondary | ICD-10-CM | POA: Diagnosis not present

## 2022-03-31 DIAGNOSIS — G4700 Insomnia, unspecified: Secondary | ICD-10-CM | POA: Diagnosis not present

## 2022-03-31 DIAGNOSIS — I739 Peripheral vascular disease, unspecified: Secondary | ICD-10-CM | POA: Diagnosis not present

## 2022-03-31 DIAGNOSIS — I10 Essential (primary) hypertension: Secondary | ICD-10-CM | POA: Diagnosis not present

## 2022-03-31 DIAGNOSIS — E785 Hyperlipidemia, unspecified: Secondary | ICD-10-CM | POA: Diagnosis not present

## 2022-03-31 DIAGNOSIS — E46 Unspecified protein-calorie malnutrition: Secondary | ICD-10-CM | POA: Diagnosis not present

## 2022-03-31 DIAGNOSIS — K5900 Constipation, unspecified: Secondary | ICD-10-CM | POA: Diagnosis not present

## 2022-04-01 DIAGNOSIS — I6330 Cerebral infarction due to thrombosis of unspecified cerebral artery: Secondary | ICD-10-CM | POA: Diagnosis not present

## 2022-04-02 DIAGNOSIS — I6330 Cerebral infarction due to thrombosis of unspecified cerebral artery: Secondary | ICD-10-CM | POA: Diagnosis not present

## 2022-04-03 DIAGNOSIS — I6330 Cerebral infarction due to thrombosis of unspecified cerebral artery: Secondary | ICD-10-CM | POA: Diagnosis not present

## 2022-04-04 DIAGNOSIS — I6330 Cerebral infarction due to thrombosis of unspecified cerebral artery: Secondary | ICD-10-CM | POA: Diagnosis not present

## 2022-04-05 DIAGNOSIS — I6330 Cerebral infarction due to thrombosis of unspecified cerebral artery: Secondary | ICD-10-CM | POA: Diagnosis not present

## 2022-04-06 DIAGNOSIS — I6330 Cerebral infarction due to thrombosis of unspecified cerebral artery: Secondary | ICD-10-CM | POA: Diagnosis not present

## 2022-04-07 DIAGNOSIS — Z419 Encounter for procedure for purposes other than remedying health state, unspecified: Secondary | ICD-10-CM | POA: Diagnosis not present

## 2022-04-07 DIAGNOSIS — I6330 Cerebral infarction due to thrombosis of unspecified cerebral artery: Secondary | ICD-10-CM | POA: Diagnosis not present

## 2022-04-08 DIAGNOSIS — I6330 Cerebral infarction due to thrombosis of unspecified cerebral artery: Secondary | ICD-10-CM | POA: Diagnosis not present

## 2022-04-09 DIAGNOSIS — I6330 Cerebral infarction due to thrombosis of unspecified cerebral artery: Secondary | ICD-10-CM | POA: Diagnosis not present

## 2022-04-10 DIAGNOSIS — I6330 Cerebral infarction due to thrombosis of unspecified cerebral artery: Secondary | ICD-10-CM | POA: Diagnosis not present

## 2022-04-11 DIAGNOSIS — I6330 Cerebral infarction due to thrombosis of unspecified cerebral artery: Secondary | ICD-10-CM | POA: Diagnosis not present

## 2022-04-12 DIAGNOSIS — I6330 Cerebral infarction due to thrombosis of unspecified cerebral artery: Secondary | ICD-10-CM | POA: Diagnosis not present

## 2022-04-13 DIAGNOSIS — I6330 Cerebral infarction due to thrombosis of unspecified cerebral artery: Secondary | ICD-10-CM | POA: Diagnosis not present

## 2022-04-14 DIAGNOSIS — I6330 Cerebral infarction due to thrombosis of unspecified cerebral artery: Secondary | ICD-10-CM | POA: Diagnosis not present

## 2022-04-15 DIAGNOSIS — I6330 Cerebral infarction due to thrombosis of unspecified cerebral artery: Secondary | ICD-10-CM | POA: Diagnosis not present

## 2022-04-16 DIAGNOSIS — I6330 Cerebral infarction due to thrombosis of unspecified cerebral artery: Secondary | ICD-10-CM | POA: Diagnosis not present

## 2022-04-17 DIAGNOSIS — I6330 Cerebral infarction due to thrombosis of unspecified cerebral artery: Secondary | ICD-10-CM | POA: Diagnosis not present

## 2022-04-18 DIAGNOSIS — I6330 Cerebral infarction due to thrombosis of unspecified cerebral artery: Secondary | ICD-10-CM | POA: Diagnosis not present

## 2022-04-19 DIAGNOSIS — I6330 Cerebral infarction due to thrombosis of unspecified cerebral artery: Secondary | ICD-10-CM | POA: Diagnosis not present

## 2022-04-20 DIAGNOSIS — I6330 Cerebral infarction due to thrombosis of unspecified cerebral artery: Secondary | ICD-10-CM | POA: Diagnosis not present

## 2022-04-21 DIAGNOSIS — I6330 Cerebral infarction due to thrombosis of unspecified cerebral artery: Secondary | ICD-10-CM | POA: Diagnosis not present

## 2022-04-21 DIAGNOSIS — F419 Anxiety disorder, unspecified: Secondary | ICD-10-CM | POA: Diagnosis not present

## 2022-04-22 DIAGNOSIS — I6330 Cerebral infarction due to thrombosis of unspecified cerebral artery: Secondary | ICD-10-CM | POA: Diagnosis not present

## 2022-04-23 DIAGNOSIS — I6330 Cerebral infarction due to thrombosis of unspecified cerebral artery: Secondary | ICD-10-CM | POA: Diagnosis not present

## 2022-04-24 DIAGNOSIS — I6330 Cerebral infarction due to thrombosis of unspecified cerebral artery: Secondary | ICD-10-CM | POA: Diagnosis not present

## 2022-04-25 DIAGNOSIS — I6330 Cerebral infarction due to thrombosis of unspecified cerebral artery: Secondary | ICD-10-CM | POA: Diagnosis not present

## 2022-04-26 DIAGNOSIS — I6330 Cerebral infarction due to thrombosis of unspecified cerebral artery: Secondary | ICD-10-CM | POA: Diagnosis not present

## 2022-04-27 DIAGNOSIS — I6330 Cerebral infarction due to thrombosis of unspecified cerebral artery: Secondary | ICD-10-CM | POA: Diagnosis not present

## 2022-04-28 DIAGNOSIS — I6330 Cerebral infarction due to thrombosis of unspecified cerebral artery: Secondary | ICD-10-CM | POA: Diagnosis not present

## 2022-04-29 DIAGNOSIS — I6330 Cerebral infarction due to thrombosis of unspecified cerebral artery: Secondary | ICD-10-CM | POA: Diagnosis not present

## 2022-04-30 DIAGNOSIS — I6330 Cerebral infarction due to thrombosis of unspecified cerebral artery: Secondary | ICD-10-CM | POA: Diagnosis not present

## 2022-04-30 DIAGNOSIS — F5105 Insomnia due to other mental disorder: Secondary | ICD-10-CM | POA: Diagnosis not present

## 2022-04-30 DIAGNOSIS — F419 Anxiety disorder, unspecified: Secondary | ICD-10-CM | POA: Diagnosis not present

## 2022-05-01 DIAGNOSIS — I6330 Cerebral infarction due to thrombosis of unspecified cerebral artery: Secondary | ICD-10-CM | POA: Diagnosis not present

## 2022-05-02 DIAGNOSIS — I6330 Cerebral infarction due to thrombosis of unspecified cerebral artery: Secondary | ICD-10-CM | POA: Diagnosis not present

## 2022-05-03 DIAGNOSIS — I6330 Cerebral infarction due to thrombosis of unspecified cerebral artery: Secondary | ICD-10-CM | POA: Diagnosis not present

## 2022-05-04 DIAGNOSIS — I6330 Cerebral infarction due to thrombosis of unspecified cerebral artery: Secondary | ICD-10-CM | POA: Diagnosis not present

## 2022-05-05 DIAGNOSIS — K5900 Constipation, unspecified: Secondary | ICD-10-CM | POA: Diagnosis not present

## 2022-05-05 DIAGNOSIS — J439 Emphysema, unspecified: Secondary | ICD-10-CM | POA: Diagnosis not present

## 2022-05-05 DIAGNOSIS — I10 Essential (primary) hypertension: Secondary | ICD-10-CM | POA: Diagnosis not present

## 2022-05-05 DIAGNOSIS — I6330 Cerebral infarction due to thrombosis of unspecified cerebral artery: Secondary | ICD-10-CM | POA: Diagnosis not present

## 2022-05-05 DIAGNOSIS — I739 Peripheral vascular disease, unspecified: Secondary | ICD-10-CM | POA: Diagnosis not present

## 2022-05-05 DIAGNOSIS — E785 Hyperlipidemia, unspecified: Secondary | ICD-10-CM | POA: Diagnosis not present

## 2022-05-05 DIAGNOSIS — G4700 Insomnia, unspecified: Secondary | ICD-10-CM | POA: Diagnosis not present

## 2022-05-05 DIAGNOSIS — E559 Vitamin D deficiency, unspecified: Secondary | ICD-10-CM | POA: Diagnosis not present

## 2022-05-05 DIAGNOSIS — N400 Enlarged prostate without lower urinary tract symptoms: Secondary | ICD-10-CM | POA: Diagnosis not present

## 2022-05-05 DIAGNOSIS — G629 Polyneuropathy, unspecified: Secondary | ICD-10-CM | POA: Diagnosis not present

## 2022-05-05 DIAGNOSIS — E46 Unspecified protein-calorie malnutrition: Secondary | ICD-10-CM | POA: Diagnosis not present

## 2022-05-06 DIAGNOSIS — I6330 Cerebral infarction due to thrombosis of unspecified cerebral artery: Secondary | ICD-10-CM | POA: Diagnosis not present

## 2022-05-07 DIAGNOSIS — I6330 Cerebral infarction due to thrombosis of unspecified cerebral artery: Secondary | ICD-10-CM | POA: Diagnosis not present

## 2022-05-08 DIAGNOSIS — I6330 Cerebral infarction due to thrombosis of unspecified cerebral artery: Secondary | ICD-10-CM | POA: Diagnosis not present

## 2022-05-08 DIAGNOSIS — Z419 Encounter for procedure for purposes other than remedying health state, unspecified: Secondary | ICD-10-CM | POA: Diagnosis not present

## 2022-05-09 DIAGNOSIS — I6330 Cerebral infarction due to thrombosis of unspecified cerebral artery: Secondary | ICD-10-CM | POA: Diagnosis not present

## 2022-05-10 DIAGNOSIS — I6330 Cerebral infarction due to thrombosis of unspecified cerebral artery: Secondary | ICD-10-CM | POA: Diagnosis not present

## 2022-05-11 DIAGNOSIS — I6330 Cerebral infarction due to thrombosis of unspecified cerebral artery: Secondary | ICD-10-CM | POA: Diagnosis not present

## 2022-05-12 DIAGNOSIS — I6330 Cerebral infarction due to thrombosis of unspecified cerebral artery: Secondary | ICD-10-CM | POA: Diagnosis not present

## 2022-05-13 DIAGNOSIS — I6330 Cerebral infarction due to thrombosis of unspecified cerebral artery: Secondary | ICD-10-CM | POA: Diagnosis not present

## 2022-05-14 DIAGNOSIS — I6330 Cerebral infarction due to thrombosis of unspecified cerebral artery: Secondary | ICD-10-CM | POA: Diagnosis not present

## 2022-05-15 DIAGNOSIS — I6330 Cerebral infarction due to thrombosis of unspecified cerebral artery: Secondary | ICD-10-CM | POA: Diagnosis not present

## 2022-05-16 DIAGNOSIS — I6330 Cerebral infarction due to thrombosis of unspecified cerebral artery: Secondary | ICD-10-CM | POA: Diagnosis not present

## 2022-05-17 DIAGNOSIS — I6330 Cerebral infarction due to thrombosis of unspecified cerebral artery: Secondary | ICD-10-CM | POA: Diagnosis not present

## 2022-05-18 DIAGNOSIS — I6330 Cerebral infarction due to thrombosis of unspecified cerebral artery: Secondary | ICD-10-CM | POA: Diagnosis not present

## 2022-05-19 DIAGNOSIS — I6330 Cerebral infarction due to thrombosis of unspecified cerebral artery: Secondary | ICD-10-CM | POA: Diagnosis not present

## 2022-05-20 DIAGNOSIS — F419 Anxiety disorder, unspecified: Secondary | ICD-10-CM | POA: Diagnosis not present

## 2022-05-20 DIAGNOSIS — I6330 Cerebral infarction due to thrombosis of unspecified cerebral artery: Secondary | ICD-10-CM | POA: Diagnosis not present

## 2022-05-21 DIAGNOSIS — I6330 Cerebral infarction due to thrombosis of unspecified cerebral artery: Secondary | ICD-10-CM | POA: Diagnosis not present

## 2022-05-22 DIAGNOSIS — I6330 Cerebral infarction due to thrombosis of unspecified cerebral artery: Secondary | ICD-10-CM | POA: Diagnosis not present

## 2022-05-23 DIAGNOSIS — I6330 Cerebral infarction due to thrombosis of unspecified cerebral artery: Secondary | ICD-10-CM | POA: Diagnosis not present

## 2022-05-24 DIAGNOSIS — I6330 Cerebral infarction due to thrombosis of unspecified cerebral artery: Secondary | ICD-10-CM | POA: Diagnosis not present

## 2022-05-25 DIAGNOSIS — I6330 Cerebral infarction due to thrombosis of unspecified cerebral artery: Secondary | ICD-10-CM | POA: Diagnosis not present

## 2022-05-26 DIAGNOSIS — I6330 Cerebral infarction due to thrombosis of unspecified cerebral artery: Secondary | ICD-10-CM | POA: Diagnosis not present

## 2022-05-27 DIAGNOSIS — I6330 Cerebral infarction due to thrombosis of unspecified cerebral artery: Secondary | ICD-10-CM | POA: Diagnosis not present

## 2022-05-28 DIAGNOSIS — I6330 Cerebral infarction due to thrombosis of unspecified cerebral artery: Secondary | ICD-10-CM | POA: Diagnosis not present

## 2022-05-29 DIAGNOSIS — I6330 Cerebral infarction due to thrombosis of unspecified cerebral artery: Secondary | ICD-10-CM | POA: Diagnosis not present

## 2022-05-30 DIAGNOSIS — I6330 Cerebral infarction due to thrombosis of unspecified cerebral artery: Secondary | ICD-10-CM | POA: Diagnosis not present

## 2022-05-31 DIAGNOSIS — I6330 Cerebral infarction due to thrombosis of unspecified cerebral artery: Secondary | ICD-10-CM | POA: Diagnosis not present

## 2022-06-01 DIAGNOSIS — I6330 Cerebral infarction due to thrombosis of unspecified cerebral artery: Secondary | ICD-10-CM | POA: Diagnosis not present

## 2022-06-02 DIAGNOSIS — I6330 Cerebral infarction due to thrombosis of unspecified cerebral artery: Secondary | ICD-10-CM | POA: Diagnosis not present

## 2022-06-03 DIAGNOSIS — I6330 Cerebral infarction due to thrombosis of unspecified cerebral artery: Secondary | ICD-10-CM | POA: Diagnosis not present

## 2022-06-04 DIAGNOSIS — I6330 Cerebral infarction due to thrombosis of unspecified cerebral artery: Secondary | ICD-10-CM | POA: Diagnosis not present

## 2022-06-05 DIAGNOSIS — I6330 Cerebral infarction due to thrombosis of unspecified cerebral artery: Secondary | ICD-10-CM | POA: Diagnosis not present

## 2022-06-06 DIAGNOSIS — I6330 Cerebral infarction due to thrombosis of unspecified cerebral artery: Secondary | ICD-10-CM | POA: Diagnosis not present

## 2022-06-07 DIAGNOSIS — I6330 Cerebral infarction due to thrombosis of unspecified cerebral artery: Secondary | ICD-10-CM | POA: Diagnosis not present

## 2022-06-07 DIAGNOSIS — Z419 Encounter for procedure for purposes other than remedying health state, unspecified: Secondary | ICD-10-CM | POA: Diagnosis not present

## 2022-06-08 DIAGNOSIS — I6330 Cerebral infarction due to thrombosis of unspecified cerebral artery: Secondary | ICD-10-CM | POA: Diagnosis not present

## 2022-06-09 DIAGNOSIS — I6330 Cerebral infarction due to thrombosis of unspecified cerebral artery: Secondary | ICD-10-CM | POA: Diagnosis not present

## 2022-06-10 DIAGNOSIS — I6330 Cerebral infarction due to thrombosis of unspecified cerebral artery: Secondary | ICD-10-CM | POA: Diagnosis not present

## 2022-06-11 DIAGNOSIS — I6330 Cerebral infarction due to thrombosis of unspecified cerebral artery: Secondary | ICD-10-CM | POA: Diagnosis not present

## 2022-06-12 DIAGNOSIS — I6330 Cerebral infarction due to thrombosis of unspecified cerebral artery: Secondary | ICD-10-CM | POA: Diagnosis not present

## 2022-06-13 DIAGNOSIS — I6330 Cerebral infarction due to thrombosis of unspecified cerebral artery: Secondary | ICD-10-CM | POA: Diagnosis not present

## 2022-06-14 DIAGNOSIS — I6330 Cerebral infarction due to thrombosis of unspecified cerebral artery: Secondary | ICD-10-CM | POA: Diagnosis not present

## 2022-06-15 DIAGNOSIS — I6330 Cerebral infarction due to thrombosis of unspecified cerebral artery: Secondary | ICD-10-CM | POA: Diagnosis not present

## 2022-06-16 DIAGNOSIS — G4700 Insomnia, unspecified: Secondary | ICD-10-CM | POA: Diagnosis not present

## 2022-06-16 DIAGNOSIS — R5381 Other malaise: Secondary | ICD-10-CM | POA: Diagnosis not present

## 2022-06-16 DIAGNOSIS — I10 Essential (primary) hypertension: Secondary | ICD-10-CM | POA: Diagnosis not present

## 2022-06-16 DIAGNOSIS — N400 Enlarged prostate without lower urinary tract symptoms: Secondary | ICD-10-CM | POA: Diagnosis not present

## 2022-06-16 DIAGNOSIS — I739 Peripheral vascular disease, unspecified: Secondary | ICD-10-CM | POA: Diagnosis not present

## 2022-06-16 DIAGNOSIS — E559 Vitamin D deficiency, unspecified: Secondary | ICD-10-CM | POA: Diagnosis not present

## 2022-06-16 DIAGNOSIS — E46 Unspecified protein-calorie malnutrition: Secondary | ICD-10-CM | POA: Diagnosis not present

## 2022-06-16 DIAGNOSIS — E785 Hyperlipidemia, unspecified: Secondary | ICD-10-CM | POA: Diagnosis not present

## 2022-06-16 DIAGNOSIS — K5900 Constipation, unspecified: Secondary | ICD-10-CM | POA: Diagnosis not present

## 2022-06-16 DIAGNOSIS — I6330 Cerebral infarction due to thrombosis of unspecified cerebral artery: Secondary | ICD-10-CM | POA: Diagnosis not present

## 2022-06-17 DIAGNOSIS — I6330 Cerebral infarction due to thrombosis of unspecified cerebral artery: Secondary | ICD-10-CM | POA: Diagnosis not present

## 2022-06-17 DIAGNOSIS — F419 Anxiety disorder, unspecified: Secondary | ICD-10-CM | POA: Diagnosis not present

## 2022-06-18 DIAGNOSIS — I6330 Cerebral infarction due to thrombosis of unspecified cerebral artery: Secondary | ICD-10-CM | POA: Diagnosis not present

## 2022-06-19 DIAGNOSIS — I6330 Cerebral infarction due to thrombosis of unspecified cerebral artery: Secondary | ICD-10-CM | POA: Diagnosis not present

## 2022-06-20 DIAGNOSIS — I6330 Cerebral infarction due to thrombosis of unspecified cerebral artery: Secondary | ICD-10-CM | POA: Diagnosis not present

## 2022-06-21 DIAGNOSIS — I6330 Cerebral infarction due to thrombosis of unspecified cerebral artery: Secondary | ICD-10-CM | POA: Diagnosis not present

## 2022-06-22 DIAGNOSIS — I6330 Cerebral infarction due to thrombosis of unspecified cerebral artery: Secondary | ICD-10-CM | POA: Diagnosis not present

## 2022-06-23 DIAGNOSIS — I6330 Cerebral infarction due to thrombosis of unspecified cerebral artery: Secondary | ICD-10-CM | POA: Diagnosis not present

## 2022-06-24 DIAGNOSIS — I6330 Cerebral infarction due to thrombosis of unspecified cerebral artery: Secondary | ICD-10-CM | POA: Diagnosis not present

## 2022-06-25 DIAGNOSIS — I6330 Cerebral infarction due to thrombosis of unspecified cerebral artery: Secondary | ICD-10-CM | POA: Diagnosis not present

## 2022-06-26 DIAGNOSIS — I6330 Cerebral infarction due to thrombosis of unspecified cerebral artery: Secondary | ICD-10-CM | POA: Diagnosis not present

## 2022-06-27 DIAGNOSIS — I6330 Cerebral infarction due to thrombosis of unspecified cerebral artery: Secondary | ICD-10-CM | POA: Diagnosis not present

## 2022-06-28 DIAGNOSIS — I6330 Cerebral infarction due to thrombosis of unspecified cerebral artery: Secondary | ICD-10-CM | POA: Diagnosis not present

## 2022-06-29 DIAGNOSIS — I6330 Cerebral infarction due to thrombosis of unspecified cerebral artery: Secondary | ICD-10-CM | POA: Diagnosis not present

## 2022-06-30 DIAGNOSIS — D518 Other vitamin B12 deficiency anemias: Secondary | ICD-10-CM | POA: Diagnosis not present

## 2022-06-30 DIAGNOSIS — E119 Type 2 diabetes mellitus without complications: Secondary | ICD-10-CM | POA: Diagnosis not present

## 2022-06-30 DIAGNOSIS — I6330 Cerebral infarction due to thrombosis of unspecified cerebral artery: Secondary | ICD-10-CM | POA: Diagnosis not present

## 2022-06-30 DIAGNOSIS — E038 Other specified hypothyroidism: Secondary | ICD-10-CM | POA: Diagnosis not present

## 2022-06-30 DIAGNOSIS — Z79899 Other long term (current) drug therapy: Secondary | ICD-10-CM | POA: Diagnosis not present

## 2022-06-30 DIAGNOSIS — E782 Mixed hyperlipidemia: Secondary | ICD-10-CM | POA: Diagnosis not present

## 2022-07-01 DIAGNOSIS — I6330 Cerebral infarction due to thrombosis of unspecified cerebral artery: Secondary | ICD-10-CM | POA: Diagnosis not present

## 2022-07-02 DIAGNOSIS — I6330 Cerebral infarction due to thrombosis of unspecified cerebral artery: Secondary | ICD-10-CM | POA: Diagnosis not present

## 2022-07-03 DIAGNOSIS — I6330 Cerebral infarction due to thrombosis of unspecified cerebral artery: Secondary | ICD-10-CM | POA: Diagnosis not present

## 2022-07-04 DIAGNOSIS — I6330 Cerebral infarction due to thrombosis of unspecified cerebral artery: Secondary | ICD-10-CM | POA: Diagnosis not present

## 2022-07-05 DIAGNOSIS — I6330 Cerebral infarction due to thrombosis of unspecified cerebral artery: Secondary | ICD-10-CM | POA: Diagnosis not present

## 2022-07-06 DIAGNOSIS — I6330 Cerebral infarction due to thrombosis of unspecified cerebral artery: Secondary | ICD-10-CM | POA: Diagnosis not present

## 2022-07-07 DIAGNOSIS — E782 Mixed hyperlipidemia: Secondary | ICD-10-CM | POA: Diagnosis not present

## 2022-07-07 DIAGNOSIS — E119 Type 2 diabetes mellitus without complications: Secondary | ICD-10-CM | POA: Diagnosis not present

## 2022-07-07 DIAGNOSIS — Z79899 Other long term (current) drug therapy: Secondary | ICD-10-CM | POA: Diagnosis not present

## 2022-07-07 DIAGNOSIS — I6330 Cerebral infarction due to thrombosis of unspecified cerebral artery: Secondary | ICD-10-CM | POA: Diagnosis not present

## 2022-07-07 DIAGNOSIS — D518 Other vitamin B12 deficiency anemias: Secondary | ICD-10-CM | POA: Diagnosis not present

## 2022-07-08 DIAGNOSIS — Z419 Encounter for procedure for purposes other than remedying health state, unspecified: Secondary | ICD-10-CM | POA: Diagnosis not present

## 2022-07-08 DIAGNOSIS — I6330 Cerebral infarction due to thrombosis of unspecified cerebral artery: Secondary | ICD-10-CM | POA: Diagnosis not present

## 2022-07-09 DIAGNOSIS — I6330 Cerebral infarction due to thrombosis of unspecified cerebral artery: Secondary | ICD-10-CM | POA: Diagnosis not present

## 2022-07-10 DIAGNOSIS — I6330 Cerebral infarction due to thrombosis of unspecified cerebral artery: Secondary | ICD-10-CM | POA: Diagnosis not present

## 2022-07-11 DIAGNOSIS — I6330 Cerebral infarction due to thrombosis of unspecified cerebral artery: Secondary | ICD-10-CM | POA: Diagnosis not present

## 2022-07-12 DIAGNOSIS — I6330 Cerebral infarction due to thrombosis of unspecified cerebral artery: Secondary | ICD-10-CM | POA: Diagnosis not present

## 2022-07-13 DIAGNOSIS — I6330 Cerebral infarction due to thrombosis of unspecified cerebral artery: Secondary | ICD-10-CM | POA: Diagnosis not present

## 2022-07-14 DIAGNOSIS — K5900 Constipation, unspecified: Secondary | ICD-10-CM | POA: Diagnosis not present

## 2022-07-14 DIAGNOSIS — I10 Essential (primary) hypertension: Secondary | ICD-10-CM | POA: Diagnosis not present

## 2022-07-14 DIAGNOSIS — N400 Enlarged prostate without lower urinary tract symptoms: Secondary | ICD-10-CM | POA: Diagnosis not present

## 2022-07-14 DIAGNOSIS — G4700 Insomnia, unspecified: Secondary | ICD-10-CM | POA: Diagnosis not present

## 2022-07-14 DIAGNOSIS — E875 Hyperkalemia: Secondary | ICD-10-CM | POA: Diagnosis not present

## 2022-07-14 DIAGNOSIS — E559 Vitamin D deficiency, unspecified: Secondary | ICD-10-CM | POA: Diagnosis not present

## 2022-07-14 DIAGNOSIS — E785 Hyperlipidemia, unspecified: Secondary | ICD-10-CM | POA: Diagnosis not present

## 2022-07-14 DIAGNOSIS — G629 Polyneuropathy, unspecified: Secondary | ICD-10-CM | POA: Diagnosis not present

## 2022-07-14 DIAGNOSIS — I739 Peripheral vascular disease, unspecified: Secondary | ICD-10-CM | POA: Diagnosis not present

## 2022-07-14 DIAGNOSIS — I6330 Cerebral infarction due to thrombosis of unspecified cerebral artery: Secondary | ICD-10-CM | POA: Diagnosis not present

## 2022-07-15 DIAGNOSIS — I6330 Cerebral infarction due to thrombosis of unspecified cerebral artery: Secondary | ICD-10-CM | POA: Diagnosis not present

## 2022-07-15 DIAGNOSIS — R636 Underweight: Secondary | ICD-10-CM | POA: Diagnosis not present

## 2022-07-15 DIAGNOSIS — F419 Anxiety disorder, unspecified: Secondary | ICD-10-CM | POA: Diagnosis not present

## 2022-07-16 DIAGNOSIS — I6330 Cerebral infarction due to thrombosis of unspecified cerebral artery: Secondary | ICD-10-CM | POA: Diagnosis not present

## 2022-07-17 DIAGNOSIS — I6330 Cerebral infarction due to thrombosis of unspecified cerebral artery: Secondary | ICD-10-CM | POA: Diagnosis not present

## 2022-07-18 DIAGNOSIS — I6330 Cerebral infarction due to thrombosis of unspecified cerebral artery: Secondary | ICD-10-CM | POA: Diagnosis not present

## 2022-07-19 DIAGNOSIS — I6330 Cerebral infarction due to thrombosis of unspecified cerebral artery: Secondary | ICD-10-CM | POA: Diagnosis not present

## 2022-07-20 DIAGNOSIS — I6330 Cerebral infarction due to thrombosis of unspecified cerebral artery: Secondary | ICD-10-CM | POA: Diagnosis not present

## 2022-07-21 DIAGNOSIS — I6330 Cerebral infarction due to thrombosis of unspecified cerebral artery: Secondary | ICD-10-CM | POA: Diagnosis not present

## 2022-07-22 DIAGNOSIS — I6330 Cerebral infarction due to thrombosis of unspecified cerebral artery: Secondary | ICD-10-CM | POA: Diagnosis not present

## 2022-07-23 DIAGNOSIS — I6330 Cerebral infarction due to thrombosis of unspecified cerebral artery: Secondary | ICD-10-CM | POA: Diagnosis not present

## 2022-07-24 DIAGNOSIS — I6330 Cerebral infarction due to thrombosis of unspecified cerebral artery: Secondary | ICD-10-CM | POA: Diagnosis not present

## 2022-07-24 DIAGNOSIS — E119 Type 2 diabetes mellitus without complications: Secondary | ICD-10-CM | POA: Diagnosis not present

## 2022-07-24 DIAGNOSIS — D518 Other vitamin B12 deficiency anemias: Secondary | ICD-10-CM | POA: Diagnosis not present

## 2022-07-24 DIAGNOSIS — E782 Mixed hyperlipidemia: Secondary | ICD-10-CM | POA: Diagnosis not present

## 2022-07-24 DIAGNOSIS — Z79899 Other long term (current) drug therapy: Secondary | ICD-10-CM | POA: Diagnosis not present

## 2022-07-25 DIAGNOSIS — I6330 Cerebral infarction due to thrombosis of unspecified cerebral artery: Secondary | ICD-10-CM | POA: Diagnosis not present

## 2022-07-26 DIAGNOSIS — I6330 Cerebral infarction due to thrombosis of unspecified cerebral artery: Secondary | ICD-10-CM | POA: Diagnosis not present

## 2022-07-27 DIAGNOSIS — I6330 Cerebral infarction due to thrombosis of unspecified cerebral artery: Secondary | ICD-10-CM | POA: Diagnosis not present

## 2022-07-28 DIAGNOSIS — I6330 Cerebral infarction due to thrombosis of unspecified cerebral artery: Secondary | ICD-10-CM | POA: Diagnosis not present

## 2022-07-29 DIAGNOSIS — I6330 Cerebral infarction due to thrombosis of unspecified cerebral artery: Secondary | ICD-10-CM | POA: Diagnosis not present

## 2022-07-30 DIAGNOSIS — I6330 Cerebral infarction due to thrombosis of unspecified cerebral artery: Secondary | ICD-10-CM | POA: Diagnosis not present

## 2022-07-31 DIAGNOSIS — I6330 Cerebral infarction due to thrombosis of unspecified cerebral artery: Secondary | ICD-10-CM | POA: Diagnosis not present

## 2022-08-01 DIAGNOSIS — I6330 Cerebral infarction due to thrombosis of unspecified cerebral artery: Secondary | ICD-10-CM | POA: Diagnosis not present

## 2022-08-02 DIAGNOSIS — I6330 Cerebral infarction due to thrombosis of unspecified cerebral artery: Secondary | ICD-10-CM | POA: Diagnosis not present

## 2022-08-03 DIAGNOSIS — I6330 Cerebral infarction due to thrombosis of unspecified cerebral artery: Secondary | ICD-10-CM | POA: Diagnosis not present

## 2022-08-04 DIAGNOSIS — I6330 Cerebral infarction due to thrombosis of unspecified cerebral artery: Secondary | ICD-10-CM | POA: Diagnosis not present

## 2022-08-04 DIAGNOSIS — D518 Other vitamin B12 deficiency anemias: Secondary | ICD-10-CM | POA: Diagnosis not present

## 2022-08-04 DIAGNOSIS — Z79899 Other long term (current) drug therapy: Secondary | ICD-10-CM | POA: Diagnosis not present

## 2022-08-04 DIAGNOSIS — E782 Mixed hyperlipidemia: Secondary | ICD-10-CM | POA: Diagnosis not present

## 2022-08-04 DIAGNOSIS — E119 Type 2 diabetes mellitus without complications: Secondary | ICD-10-CM | POA: Diagnosis not present

## 2022-08-05 DIAGNOSIS — I6330 Cerebral infarction due to thrombosis of unspecified cerebral artery: Secondary | ICD-10-CM | POA: Diagnosis not present

## 2022-08-06 DIAGNOSIS — I6330 Cerebral infarction due to thrombosis of unspecified cerebral artery: Secondary | ICD-10-CM | POA: Diagnosis not present

## 2022-08-07 DIAGNOSIS — I6330 Cerebral infarction due to thrombosis of unspecified cerebral artery: Secondary | ICD-10-CM | POA: Diagnosis not present

## 2022-08-07 DIAGNOSIS — Z419 Encounter for procedure for purposes other than remedying health state, unspecified: Secondary | ICD-10-CM | POA: Diagnosis not present

## 2022-08-08 DIAGNOSIS — I6330 Cerebral infarction due to thrombosis of unspecified cerebral artery: Secondary | ICD-10-CM | POA: Diagnosis not present

## 2022-08-09 DIAGNOSIS — I6330 Cerebral infarction due to thrombosis of unspecified cerebral artery: Secondary | ICD-10-CM | POA: Diagnosis not present

## 2022-08-10 DIAGNOSIS — F419 Anxiety disorder, unspecified: Secondary | ICD-10-CM | POA: Diagnosis not present

## 2022-08-10 DIAGNOSIS — I6330 Cerebral infarction due to thrombosis of unspecified cerebral artery: Secondary | ICD-10-CM | POA: Diagnosis not present

## 2022-08-10 DIAGNOSIS — R636 Underweight: Secondary | ICD-10-CM | POA: Diagnosis not present

## 2022-08-11 DIAGNOSIS — E559 Vitamin D deficiency, unspecified: Secondary | ICD-10-CM | POA: Diagnosis not present

## 2022-08-11 DIAGNOSIS — I10 Essential (primary) hypertension: Secondary | ICD-10-CM | POA: Diagnosis not present

## 2022-08-11 DIAGNOSIS — G8929 Other chronic pain: Secondary | ICD-10-CM | POA: Diagnosis not present

## 2022-08-11 DIAGNOSIS — I739 Peripheral vascular disease, unspecified: Secondary | ICD-10-CM | POA: Diagnosis not present

## 2022-08-11 DIAGNOSIS — J439 Emphysema, unspecified: Secondary | ICD-10-CM | POA: Diagnosis not present

## 2022-08-11 DIAGNOSIS — G629 Polyneuropathy, unspecified: Secondary | ICD-10-CM | POA: Diagnosis not present

## 2022-08-11 DIAGNOSIS — E782 Mixed hyperlipidemia: Secondary | ICD-10-CM | POA: Diagnosis not present

## 2022-08-11 DIAGNOSIS — N400 Enlarged prostate without lower urinary tract symptoms: Secondary | ICD-10-CM | POA: Diagnosis not present

## 2022-08-11 DIAGNOSIS — D518 Other vitamin B12 deficiency anemias: Secondary | ICD-10-CM | POA: Diagnosis not present

## 2022-08-11 DIAGNOSIS — E785 Hyperlipidemia, unspecified: Secondary | ICD-10-CM | POA: Diagnosis not present

## 2022-08-11 DIAGNOSIS — K5900 Constipation, unspecified: Secondary | ICD-10-CM | POA: Diagnosis not present

## 2022-08-11 DIAGNOSIS — Z79899 Other long term (current) drug therapy: Secondary | ICD-10-CM | POA: Diagnosis not present

## 2022-08-11 DIAGNOSIS — I6330 Cerebral infarction due to thrombosis of unspecified cerebral artery: Secondary | ICD-10-CM | POA: Diagnosis not present

## 2022-08-11 DIAGNOSIS — E119 Type 2 diabetes mellitus without complications: Secondary | ICD-10-CM | POA: Diagnosis not present

## 2022-08-11 DIAGNOSIS — G4700 Insomnia, unspecified: Secondary | ICD-10-CM | POA: Diagnosis not present

## 2022-08-12 DIAGNOSIS — I6330 Cerebral infarction due to thrombosis of unspecified cerebral artery: Secondary | ICD-10-CM | POA: Diagnosis not present

## 2022-08-13 DIAGNOSIS — I6330 Cerebral infarction due to thrombosis of unspecified cerebral artery: Secondary | ICD-10-CM | POA: Diagnosis not present

## 2022-08-14 ENCOUNTER — Encounter (INDEPENDENT_AMBULATORY_CARE_PROVIDER_SITE_OTHER): Payer: Medicaid Other

## 2022-08-14 ENCOUNTER — Ambulatory Visit (INDEPENDENT_AMBULATORY_CARE_PROVIDER_SITE_OTHER): Payer: Medicaid Other | Admitting: Nurse Practitioner

## 2022-08-14 DIAGNOSIS — I6330 Cerebral infarction due to thrombosis of unspecified cerebral artery: Secondary | ICD-10-CM | POA: Diagnosis not present

## 2022-08-15 DIAGNOSIS — I6330 Cerebral infarction due to thrombosis of unspecified cerebral artery: Secondary | ICD-10-CM | POA: Diagnosis not present

## 2022-08-16 DIAGNOSIS — I6330 Cerebral infarction due to thrombosis of unspecified cerebral artery: Secondary | ICD-10-CM | POA: Diagnosis not present

## 2022-08-17 DIAGNOSIS — I6330 Cerebral infarction due to thrombosis of unspecified cerebral artery: Secondary | ICD-10-CM | POA: Diagnosis not present

## 2022-08-18 DIAGNOSIS — I6330 Cerebral infarction due to thrombosis of unspecified cerebral artery: Secondary | ICD-10-CM | POA: Diagnosis not present

## 2022-08-19 DIAGNOSIS — I6330 Cerebral infarction due to thrombosis of unspecified cerebral artery: Secondary | ICD-10-CM | POA: Diagnosis not present

## 2022-08-20 DIAGNOSIS — I6330 Cerebral infarction due to thrombosis of unspecified cerebral artery: Secondary | ICD-10-CM | POA: Diagnosis not present

## 2022-08-21 DIAGNOSIS — I6330 Cerebral infarction due to thrombosis of unspecified cerebral artery: Secondary | ICD-10-CM | POA: Diagnosis not present

## 2022-08-22 DIAGNOSIS — I6330 Cerebral infarction due to thrombosis of unspecified cerebral artery: Secondary | ICD-10-CM | POA: Diagnosis not present

## 2022-08-23 DIAGNOSIS — I6330 Cerebral infarction due to thrombosis of unspecified cerebral artery: Secondary | ICD-10-CM | POA: Diagnosis not present

## 2022-08-24 ENCOUNTER — Other Ambulatory Visit (INDEPENDENT_AMBULATORY_CARE_PROVIDER_SITE_OTHER): Payer: Self-pay | Admitting: Vascular Surgery

## 2022-08-24 DIAGNOSIS — Z9889 Other specified postprocedural states: Secondary | ICD-10-CM

## 2022-08-24 DIAGNOSIS — I6330 Cerebral infarction due to thrombosis of unspecified cerebral artery: Secondary | ICD-10-CM | POA: Diagnosis not present

## 2022-08-25 ENCOUNTER — Ambulatory Visit (INDEPENDENT_AMBULATORY_CARE_PROVIDER_SITE_OTHER): Payer: Medicaid Other | Admitting: Vascular Surgery

## 2022-08-25 ENCOUNTER — Encounter (INDEPENDENT_AMBULATORY_CARE_PROVIDER_SITE_OTHER): Payer: Medicaid Other

## 2022-08-25 DIAGNOSIS — I6330 Cerebral infarction due to thrombosis of unspecified cerebral artery: Secondary | ICD-10-CM | POA: Diagnosis not present

## 2022-08-26 DIAGNOSIS — I6330 Cerebral infarction due to thrombosis of unspecified cerebral artery: Secondary | ICD-10-CM | POA: Diagnosis not present

## 2022-08-27 DIAGNOSIS — I6330 Cerebral infarction due to thrombosis of unspecified cerebral artery: Secondary | ICD-10-CM | POA: Diagnosis not present

## 2022-08-28 DIAGNOSIS — I6330 Cerebral infarction due to thrombosis of unspecified cerebral artery: Secondary | ICD-10-CM | POA: Diagnosis not present

## 2022-08-29 DIAGNOSIS — I6330 Cerebral infarction due to thrombosis of unspecified cerebral artery: Secondary | ICD-10-CM | POA: Diagnosis not present

## 2022-08-30 DIAGNOSIS — I6330 Cerebral infarction due to thrombosis of unspecified cerebral artery: Secondary | ICD-10-CM | POA: Diagnosis not present

## 2022-08-31 DIAGNOSIS — I6330 Cerebral infarction due to thrombosis of unspecified cerebral artery: Secondary | ICD-10-CM | POA: Diagnosis not present

## 2022-09-01 DIAGNOSIS — I6330 Cerebral infarction due to thrombosis of unspecified cerebral artery: Secondary | ICD-10-CM | POA: Diagnosis not present

## 2022-09-02 DIAGNOSIS — I6330 Cerebral infarction due to thrombosis of unspecified cerebral artery: Secondary | ICD-10-CM | POA: Diagnosis not present

## 2022-09-03 DIAGNOSIS — I6330 Cerebral infarction due to thrombosis of unspecified cerebral artery: Secondary | ICD-10-CM | POA: Diagnosis not present

## 2022-09-04 DIAGNOSIS — I6330 Cerebral infarction due to thrombosis of unspecified cerebral artery: Secondary | ICD-10-CM | POA: Diagnosis not present

## 2022-09-05 DIAGNOSIS — I6330 Cerebral infarction due to thrombosis of unspecified cerebral artery: Secondary | ICD-10-CM | POA: Diagnosis not present

## 2022-09-06 DIAGNOSIS — I6330 Cerebral infarction due to thrombosis of unspecified cerebral artery: Secondary | ICD-10-CM | POA: Diagnosis not present

## 2022-09-07 DIAGNOSIS — Z419 Encounter for procedure for purposes other than remedying health state, unspecified: Secondary | ICD-10-CM | POA: Diagnosis not present

## 2022-09-07 DIAGNOSIS — I6330 Cerebral infarction due to thrombosis of unspecified cerebral artery: Secondary | ICD-10-CM | POA: Diagnosis not present

## 2022-09-08 DIAGNOSIS — I6330 Cerebral infarction due to thrombosis of unspecified cerebral artery: Secondary | ICD-10-CM | POA: Diagnosis not present

## 2022-09-08 DIAGNOSIS — E559 Vitamin D deficiency, unspecified: Secondary | ICD-10-CM | POA: Diagnosis not present

## 2022-09-08 DIAGNOSIS — E46 Unspecified protein-calorie malnutrition: Secondary | ICD-10-CM | POA: Diagnosis not present

## 2022-09-08 DIAGNOSIS — R5381 Other malaise: Secondary | ICD-10-CM | POA: Diagnosis not present

## 2022-09-08 DIAGNOSIS — G4700 Insomnia, unspecified: Secondary | ICD-10-CM | POA: Diagnosis not present

## 2022-09-08 DIAGNOSIS — E785 Hyperlipidemia, unspecified: Secondary | ICD-10-CM | POA: Diagnosis not present

## 2022-09-08 DIAGNOSIS — I10 Essential (primary) hypertension: Secondary | ICD-10-CM | POA: Diagnosis not present

## 2022-09-09 DIAGNOSIS — I6330 Cerebral infarction due to thrombosis of unspecified cerebral artery: Secondary | ICD-10-CM | POA: Diagnosis not present

## 2022-09-10 DIAGNOSIS — I6330 Cerebral infarction due to thrombosis of unspecified cerebral artery: Secondary | ICD-10-CM | POA: Diagnosis not present

## 2022-09-11 DIAGNOSIS — I6330 Cerebral infarction due to thrombosis of unspecified cerebral artery: Secondary | ICD-10-CM | POA: Diagnosis not present

## 2022-09-12 DIAGNOSIS — I6330 Cerebral infarction due to thrombosis of unspecified cerebral artery: Secondary | ICD-10-CM | POA: Diagnosis not present

## 2022-09-13 DIAGNOSIS — I6330 Cerebral infarction due to thrombosis of unspecified cerebral artery: Secondary | ICD-10-CM | POA: Diagnosis not present

## 2022-09-14 DIAGNOSIS — I6330 Cerebral infarction due to thrombosis of unspecified cerebral artery: Secondary | ICD-10-CM | POA: Diagnosis not present

## 2022-09-15 DIAGNOSIS — I6330 Cerebral infarction due to thrombosis of unspecified cerebral artery: Secondary | ICD-10-CM | POA: Diagnosis not present

## 2022-09-16 DIAGNOSIS — I6330 Cerebral infarction due to thrombosis of unspecified cerebral artery: Secondary | ICD-10-CM | POA: Diagnosis not present

## 2022-09-17 DIAGNOSIS — I6330 Cerebral infarction due to thrombosis of unspecified cerebral artery: Secondary | ICD-10-CM | POA: Diagnosis not present

## 2022-09-18 DIAGNOSIS — I6330 Cerebral infarction due to thrombosis of unspecified cerebral artery: Secondary | ICD-10-CM | POA: Diagnosis not present

## 2022-09-19 DIAGNOSIS — I6330 Cerebral infarction due to thrombosis of unspecified cerebral artery: Secondary | ICD-10-CM | POA: Diagnosis not present

## 2022-09-20 DIAGNOSIS — I6330 Cerebral infarction due to thrombosis of unspecified cerebral artery: Secondary | ICD-10-CM | POA: Diagnosis not present

## 2022-09-21 DIAGNOSIS — I6330 Cerebral infarction due to thrombosis of unspecified cerebral artery: Secondary | ICD-10-CM | POA: Diagnosis not present

## 2022-09-22 DIAGNOSIS — I6330 Cerebral infarction due to thrombosis of unspecified cerebral artery: Secondary | ICD-10-CM | POA: Diagnosis not present

## 2022-09-23 DIAGNOSIS — I6330 Cerebral infarction due to thrombosis of unspecified cerebral artery: Secondary | ICD-10-CM | POA: Diagnosis not present

## 2022-09-24 DIAGNOSIS — I6330 Cerebral infarction due to thrombosis of unspecified cerebral artery: Secondary | ICD-10-CM | POA: Diagnosis not present

## 2022-09-25 ENCOUNTER — Ambulatory Visit (INDEPENDENT_AMBULATORY_CARE_PROVIDER_SITE_OTHER): Payer: Medicaid Other | Admitting: Nurse Practitioner

## 2022-09-25 ENCOUNTER — Encounter (INDEPENDENT_AMBULATORY_CARE_PROVIDER_SITE_OTHER): Payer: Self-pay | Admitting: Nurse Practitioner

## 2022-09-25 ENCOUNTER — Ambulatory Visit (INDEPENDENT_AMBULATORY_CARE_PROVIDER_SITE_OTHER): Payer: Medicaid Other

## 2022-09-25 VITALS — BP 150/90 | HR 90 | Ht 68.0 in | Wt 110.0 lb

## 2022-09-25 DIAGNOSIS — I739 Peripheral vascular disease, unspecified: Secondary | ICD-10-CM

## 2022-09-25 DIAGNOSIS — I10 Essential (primary) hypertension: Secondary | ICD-10-CM | POA: Diagnosis not present

## 2022-09-25 DIAGNOSIS — Z9889 Other specified postprocedural states: Secondary | ICD-10-CM

## 2022-09-25 DIAGNOSIS — I6330 Cerebral infarction due to thrombosis of unspecified cerebral artery: Secondary | ICD-10-CM | POA: Diagnosis not present

## 2022-09-26 DIAGNOSIS — I6330 Cerebral infarction due to thrombosis of unspecified cerebral artery: Secondary | ICD-10-CM | POA: Diagnosis not present

## 2022-09-27 DIAGNOSIS — I6330 Cerebral infarction due to thrombosis of unspecified cerebral artery: Secondary | ICD-10-CM | POA: Diagnosis not present

## 2022-09-28 DIAGNOSIS — D518 Other vitamin B12 deficiency anemias: Secondary | ICD-10-CM | POA: Diagnosis not present

## 2022-09-28 DIAGNOSIS — I6330 Cerebral infarction due to thrombosis of unspecified cerebral artery: Secondary | ICD-10-CM | POA: Diagnosis not present

## 2022-09-28 DIAGNOSIS — Z79899 Other long term (current) drug therapy: Secondary | ICD-10-CM | POA: Diagnosis not present

## 2022-09-28 DIAGNOSIS — E782 Mixed hyperlipidemia: Secondary | ICD-10-CM | POA: Diagnosis not present

## 2022-09-28 DIAGNOSIS — E119 Type 2 diabetes mellitus without complications: Secondary | ICD-10-CM | POA: Diagnosis not present

## 2022-09-28 DIAGNOSIS — E038 Other specified hypothyroidism: Secondary | ICD-10-CM | POA: Diagnosis not present

## 2022-09-29 DIAGNOSIS — I6330 Cerebral infarction due to thrombosis of unspecified cerebral artery: Secondary | ICD-10-CM | POA: Diagnosis not present

## 2022-09-29 LAB — VAS US ABI WITH/WO TBI
Left ABI: 1.03
Right ABI: 1.07

## 2022-09-30 DIAGNOSIS — I6330 Cerebral infarction due to thrombosis of unspecified cerebral artery: Secondary | ICD-10-CM | POA: Diagnosis not present

## 2022-10-01 DIAGNOSIS — I6330 Cerebral infarction due to thrombosis of unspecified cerebral artery: Secondary | ICD-10-CM | POA: Diagnosis not present

## 2022-10-02 DIAGNOSIS — I6330 Cerebral infarction due to thrombosis of unspecified cerebral artery: Secondary | ICD-10-CM | POA: Diagnosis not present

## 2022-10-03 DIAGNOSIS — I6330 Cerebral infarction due to thrombosis of unspecified cerebral artery: Secondary | ICD-10-CM | POA: Diagnosis not present

## 2022-10-04 DIAGNOSIS — I6330 Cerebral infarction due to thrombosis of unspecified cerebral artery: Secondary | ICD-10-CM | POA: Diagnosis not present

## 2022-10-05 DIAGNOSIS — I6330 Cerebral infarction due to thrombosis of unspecified cerebral artery: Secondary | ICD-10-CM | POA: Diagnosis not present

## 2022-10-06 DIAGNOSIS — E785 Hyperlipidemia, unspecified: Secondary | ICD-10-CM | POA: Diagnosis not present

## 2022-10-06 DIAGNOSIS — E46 Unspecified protein-calorie malnutrition: Secondary | ICD-10-CM | POA: Diagnosis not present

## 2022-10-06 DIAGNOSIS — J439 Emphysema, unspecified: Secondary | ICD-10-CM | POA: Diagnosis not present

## 2022-10-06 DIAGNOSIS — I6330 Cerebral infarction due to thrombosis of unspecified cerebral artery: Secondary | ICD-10-CM | POA: Diagnosis not present

## 2022-10-06 DIAGNOSIS — I10 Essential (primary) hypertension: Secondary | ICD-10-CM | POA: Diagnosis not present

## 2022-10-06 DIAGNOSIS — E559 Vitamin D deficiency, unspecified: Secondary | ICD-10-CM | POA: Diagnosis not present

## 2022-10-06 DIAGNOSIS — K5900 Constipation, unspecified: Secondary | ICD-10-CM | POA: Diagnosis not present

## 2022-10-06 DIAGNOSIS — I739 Peripheral vascular disease, unspecified: Secondary | ICD-10-CM | POA: Diagnosis not present

## 2022-10-06 DIAGNOSIS — G4700 Insomnia, unspecified: Secondary | ICD-10-CM | POA: Diagnosis not present

## 2022-10-06 DIAGNOSIS — G8929 Other chronic pain: Secondary | ICD-10-CM | POA: Diagnosis not present

## 2022-10-06 DIAGNOSIS — N400 Enlarged prostate without lower urinary tract symptoms: Secondary | ICD-10-CM | POA: Diagnosis not present

## 2022-10-07 DIAGNOSIS — I6330 Cerebral infarction due to thrombosis of unspecified cerebral artery: Secondary | ICD-10-CM | POA: Diagnosis not present

## 2022-10-08 DIAGNOSIS — Z419 Encounter for procedure for purposes other than remedying health state, unspecified: Secondary | ICD-10-CM | POA: Diagnosis not present

## 2022-10-08 DIAGNOSIS — I6330 Cerebral infarction due to thrombosis of unspecified cerebral artery: Secondary | ICD-10-CM | POA: Diagnosis not present

## 2022-10-09 DIAGNOSIS — I6330 Cerebral infarction due to thrombosis of unspecified cerebral artery: Secondary | ICD-10-CM | POA: Diagnosis not present

## 2022-10-10 DIAGNOSIS — I6330 Cerebral infarction due to thrombosis of unspecified cerebral artery: Secondary | ICD-10-CM | POA: Diagnosis not present

## 2022-10-11 DIAGNOSIS — I6330 Cerebral infarction due to thrombosis of unspecified cerebral artery: Secondary | ICD-10-CM | POA: Diagnosis not present

## 2022-10-12 ENCOUNTER — Encounter (INDEPENDENT_AMBULATORY_CARE_PROVIDER_SITE_OTHER): Payer: Self-pay | Admitting: Nurse Practitioner

## 2022-10-12 DIAGNOSIS — I6330 Cerebral infarction due to thrombosis of unspecified cerebral artery: Secondary | ICD-10-CM | POA: Diagnosis not present

## 2022-10-12 NOTE — Progress Notes (Signed)
Subjective:    Patient ID: Donald [name]., male    DOB: 1961-03-15, 62 y.o.   MRN: [id number] Chief Complaint  Patient presents with   Follow-up    The patient returns to the office for followup and review of the noninvasive studies.  The patient previously had bilateral femoral endarterectomy with stent placement in 2020.  There have been no interval changes in lower extremity symptoms. No interval shortening of the patient's claudication distance or development of rest pain symptoms. No new ulcers or wounds have occurred since the last visit.  There have been no significant changes to the patient's overall health care.  The patient denies amaurosis fugax or recent TIA symptoms. There are no documented recent neurological changes noted. There is no history of DVT, PE or superficial thrombophlebitis. The patient denies recent episodes of angina or shortness of breath.   ABI Rt=1.07 and Lt=1.03  (previous ABI's Rt=1.19 and Lt=1.21) Duplex ultrasound of the bilateral tibial arteries reveals triphasic waveforms and normal toe waveforms bilaterally    Review of Systems  All other systems reviewed and are negative.      Objective:   Physical Exam Vitals reviewed.  HENT:     Head: Normocephalic.  Cardiovascular:     Rate and Rhythm: Normal rate.     Pulses: Normal pulses.  Pulmonary:     Effort: Pulmonary effort is normal.  Skin:    General: Skin is warm and dry.  Neurological:     Mental Status: He is alert and oriented to person, place, and time.  Psychiatric:        Mood and Affect: Mood normal.        Behavior: Behavior normal.        Thought Content: Thought content normal.     BP (!) 150/90   Pulse 90   Ht '5\' 8"'$  (1.727 m)   Wt 110 lb (49.9 kg)   BMI 16.73 kg/m   Past Medical History:  Diagnosis Date   Depression    Emphysema of lung ([place])    Essential hypertension    History of chicken pox    Hyponatremia    Insomnia    Low weight    MRSA nasal  colonization    Neuropathy    bilateral lower legs   Peripheral vascular disease (HCC)    Pneumonia    Prostate disease    Seasonal allergies    Seizures ([place])    Sensory ataxia    Sepsis ([place])    Stroke ([place])    Wears dentures    full upper    Social History   Socioeconomic History   Marital status: Single    Spouse name: Not on file   Number of children: Not on file   Years of education: Not on file   Highest education level: Not on file  Occupational History   Not on file  Tobacco Use   Smoking status: Former    Packs/day: 0.50    Years: 35.00    Total pack years: 17.50    Types: Cigarettes    Quit date: 01/25/2018    Years since quitting: 4.7   Smokeless tobacco: Former    Quit date: 08/31/2016   Tobacco comments:    Previously quit in 09/2016 on Chantix until 09/2017  Vaping Use   Vaping Use: Never used  Substance and Sexual Activity   Alcohol use: Not Currently    Alcohol/week: 0.0 standard drinks of alcohol  Comment: occ   Drug use: No   Sexual activity: Not on file  Other Topics Concern   Not on file  Social History Narrative   Not on file   Social Determinants of Health   Financial Resource Strain: Not on file  Food Insecurity: Not on file  Transportation Needs: Not on file  Physical Activity: Unknown (03/13/2019)   Exercise Vital Sign    Days of Exercise per Week: 0 days    Minutes of Exercise per Session: Not on file  Stress: Not on file  Social Connections: Not on file  Intimate Partner Violence: Not At Risk (03/13/2019)   Humiliation, Afraid, Rape, and Kick questionnaire    Fear of Current or Ex-Partner: No    Emotionally Abused: No    Physically Abused: No    Sexually Abused: No    Past Surgical History:  Procedure Laterality Date   ABDOMINAL AORTIC ENDOVASCULAR STENT GRAFT Bilateral 03/16/2019   Procedure: AORTIC ILIAC STENT;  Surgeon: Algernon Huxley, MD;  Location: [facility];  Service: Vascular;  Laterality: Bilateral;   AMPUTATION TOE  Right 06/16/2019   Procedure: 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;  Surgeon: Samara Deist, DPM;  Location: [facility];  Service: Podiatry;  Laterality: Right;   CATARACT EXTRACTION W/PHACO Left 05/18/2018   Procedure: CATARACT EXTRACTION PHACO AND INTRAOCULAR LENS PLACEMENT ([place]) LEFT;  Surgeon: Leandrew Koyanagi, MD;  Location: [facility];  Service: Ophthalmology;  Laterality: Left;   COLONOSCOPY WITH PROPOFOL N/A 02/25/2016   Procedure: COLONOSCOPY WITH PROPOFOL;  Surgeon: [name], MD;  Location: [facility];  Service: Endoscopy;  Laterality: N/A;   ENDARTERECTOMY FEMORAL Bilateral 03/16/2019   Procedure: ENDARTERECTOMY FEMORAL;  Surgeon: Algernon Huxley, MD;  Location: [facility];  Service: Vascular;  Laterality: Bilateral;   EYE SURGERY  2015   Cataract   FRACTURE SURGERY     ORIF wrist   LOWER EXTREMITY ANGIOGRAPHY Left 03/13/2019   Procedure: Lower Extremity Angiography (LEFT);  Surgeon: Algernon Huxley, MD;  Location: [facility];  Service: Cardiovascular;  Laterality: Left;   VASCULAR SURGERY     "7 stents in legs"    Family History  Problem Relation Age of Onset   Ulcers Father        bleeding   Cancer Mother        Breast    No Known Allergies     Latest Ref Rng & Units 03/23/2021   10:02 AM 03/24/2019    8:42 AM 03/23/2019    3:57 AM  CBC  WBC 4.0 - 10.5 K/uL 18.1  15.2  16.9   Hemoglobin 13.0 - 17.0 g/dL 12.2  7.9  8.0   Hematocrit 39.0 - 52.0 % 34.1  23.6  25.1   Platelets 150 - 400 K/uL 196  485  419       CMP     Component Value Date/Time   NA 139 03/23/2021 1002   NA 125 (L) 02/07/2016 1305   NA 130 (L) 11/20/2011 0510   K 3.8 03/23/2021 1002   K 4.0 11/20/2011 0510   CL 105 03/23/2021 1002   CL 98 11/20/2011 0510   CO2 24 03/23/2021 1002   CO2 18 (L) 11/20/2011 0510   GLUCOSE 124 (H) 03/23/2021 1002   GLUCOSE 82 11/20/2011 0510   BUN 53 (H) 03/23/2021 1002   BUN 5 (L) 02/07/2016 1305   BUN 1 (L) 11/20/2011 0510   CREATININE 0.89  03/23/2021 1002   CREATININE 0.93 01/05/2019  0800   CALCIUM 9.5 03/23/2021 1002   CALCIUM 8.5 11/20/2011 0510   PROT 7.1 03/23/2021 1002   PROT 7.9 11/14/2015 1002   PROT 6.7 11/19/2011 0358   ALBUMIN 3.8 03/23/2021 1002   ALBUMIN 4.3 11/14/2015 1002   ALBUMIN 2.9 (L) 11/19/2011 0358   AST 20 03/23/2021 1002   AST 27 11/19/2011 0358   ALT 22 03/23/2021 1002   ALT 22 11/19/2011 0358   ALKPHOS 57 03/23/2021 1002   ALKPHOS 80 11/19/2011 0358   BILITOT 0.8 03/23/2021 1002   BILITOT 0.9 11/14/2015 1002   BILITOT 0.8 11/19/2011 0358   GFRNONAA >60 03/23/2021 1002   GFRNONAA 92 01/05/2019 0800   GFRAA >60 03/24/2019 0842   GFRAA 107 01/05/2019 0800     VAS [place] ABI WITH/WO TBI  Result Date: 09/29/2022  LOWER EXTREMITY DOPPLER STUDY Patient Name:  ELIJHA DEDMAN  Date of Exam:   09/25/2022 Medical Rec #: [id number]      Accession #:    [id number] Date of Birth: February 14, 1961      Patient Gender: M Patient Age:   29 years Exam Location:  [facility] Procedure:      VAS [place] ABI WITH/WO TBI Referring Phys: --------------------------------------------------------------------------------  Indications: Peripheral artery disease.  Vascular Interventions: 7/17/2020Bilateral Fem Endarts, and stents. Comparison Study: 08/2021 Performing Technologist: Concha Norway RVT  Examination Guidelines: A complete evaluation includes at minimum, Doppler waveform signals and systolic blood pressure reading at the level of bilateral brachial, anterior tibial, and posterior tibial arteries, when vessel segments are accessible. Bilateral testing is considered an integral part of a complete examination. Photoelectric Plethysmograph (PPG) waveforms and toe systolic pressure readings are included as required and additional duplex testing as needed. Limited examinations for reoccurring indications may be performed as noted.  ABI Findings: +---------+------------------+-----+---------+--------+ Right    Rt Pressure  (mmHg)IndexWaveform Comment  +---------+------------------+-----+---------+--------+ Brachial 152                                      +---------+------------------+-----+---------+--------+ ATA      161               1.06 triphasic         +---------+------------------+-----+---------+--------+ PTA      162               1.07 triphasic         +---------+------------------+-----+---------+--------+ Great Toe120               0.79 Normal            +---------+------------------+-----+---------+--------+ +---------+------------------+-----+---------+-------+ Left     Lt Pressure (mmHg)IndexWaveform Comment +---------+------------------+-----+---------+-------+ Brachial 150                                     +---------+------------------+-----+---------+-------+ ATA      157               1.03 triphasic        +---------+------------------+-----+---------+-------+ PTA      155               1.02 triphasic        +---------+------------------+-----+---------+-------+ Great Toe138               0.91 Normal           +---------+------------------+-----+---------+-------+ +-------+-----------+-----------+------------+------------+ ABI/TBIToday's  ABIToday's TBIPrevious ABIPrevious TBI +-------+-----------+-----------+------------+------------+ Right  1.07       .79        1.19        .79          +-------+-----------+-----------+------------+------------+ Left   1.03       .91        1.21        .97          +-------+-----------+-----------+------------+------------+  Bilateral ABIs and TBIs appear essentially unchanged compared to prior study on 08/2021.  Summary: Right: Resting right ankle-brachial index is within normal range. The right toe-brachial index is normal. Left: Resting left ankle-brachial index is within normal range. The left toe-brachial index is normal. *See table(s) above for measurements and observations.  Electronically signed  by Leotis Pain MD on 09/29/2022 at 7:26:12 AM.    Final        Assessment & Plan:   1. Peripheral arterial disease with history of revascularization (HCC)  Recommend:  The patient has evidence of atherosclerosis of the lower extremities with claudication.  The patient does not voice lifestyle limiting changes at this point in time.  Noninvasive studies do not suggest clinically significant change.  No invasive studies, angiography or surgery at this time The patient should continue walking and begin a more formal exercise program.  The patient should continue antiplatelet therapy and aggressive treatment of the lipid abnormalities  No changes in the patient's medications at this time  Continued surveillance is indicated as atherosclerosis is likely to progress with time.    The patient will continue follow up with noninvasive studies as ordered.   2. Essential hypertension Continue antihypertensive medications as already ordered, these medications have been reviewed and there are no changes at this time.   Current Outpatient Medications on File Prior to Visit  Medication Sig Dispense Refill   acetaminophen (TYLENOL) 500 MG tablet Take 500 mg by mouth every 6 (six) hours as needed for moderate pain.      albuterol (VENTOLIN HFA) 108 (90 Base) MCG/ACT inhaler Inhale 2 puffs into the lungs every 6 (six) hours as needed for wheezing or shortness of breath. 8.5 g 2   amitriptyline (ELAVIL) 10 MG tablet Take 10 mg by mouth at bedtime.     apixaban (ELIQUIS) 5 MG TABS tablet Take 1 tablet (5 mg total) by mouth 2 (two) times daily. 60 tablet    atorvastatin (LIPITOR) 40 MG tablet Take 1 tablet (40 mg total) by mouth daily at 6 PM.     cholecalciferol (VITAMIN D) 25 MCG tablet Take 1 tablet (1,000 Units total) by mouth daily.     diltiazem (CARDIZEM CD) 240 MG 24 hr capsule Take 1 capsule (240 mg total) by mouth daily.     docusate sodium (COLACE) 100 MG capsule Take 100 mg by mouth daily.      ELIQUIS 2.5 MG TABS tablet Take 2.5 mg by mouth 2 (two) times daily.     ferrous sulfate 325 (65 FE) MG EC tablet Take 325 mg by mouth daily at 6 (six) AM.     gabapentin (NEURONTIN) 400 MG capsule Take 400 mg by mouth 3 (three) times daily.     hydrALAZINE (APRESOLINE) 50 MG tablet Take 50 mg by mouth in the morning, at noon, in the evening, and at bedtime.     loperamide (IMODIUM A-D) 2 MG tablet Take 2 mg by mouth every 6 (six) hours as needed for diarrhea or loose stools.  Melatonin 1 MG/4ML LIQD Take by mouth.     metoprolol succinate (TOPROL-XL) 50 MG 24 hr tablet Take 2 tablets (100 mg total) by mouth daily. 90 tablet 1   Nutritional Supplements (FEEDING SUPPLEMENT, NEPRO CARB STEADY,) LIQD Take 237 mLs by mouth 2 (two) times daily between meals. (Patient taking differently: Take 237 mLs by mouth 4 (four) times daily.)  0   oxyCODONE-acetaminophen (PERCOCET) 10-325 MG tablet Take 1 tablet by mouth every 6 (six) hours as needed.     tamsulosin (FLOMAX) 0.4 MG CAPS capsule Take 1 capsule (0.4 mg total) by mouth daily after supper. 90 capsule 3   traZODone (DESYREL) 50 MG tablet Take 50 mg by mouth at bedtime.     vitamin C (VITAMIN C) 250 MG tablet Take 1 tablet (250 mg total) by mouth 2 (two) times daily.     No current facility-administered medications on file prior to visit.    There are no Patient Instructions on file for this visit. No follow-ups on file.   Kris Hartmann, NP

## 2022-10-13 DIAGNOSIS — I6330 Cerebral infarction due to thrombosis of unspecified cerebral artery: Secondary | ICD-10-CM | POA: Diagnosis not present

## 2022-10-14 DIAGNOSIS — I6330 Cerebral infarction due to thrombosis of unspecified cerebral artery: Secondary | ICD-10-CM | POA: Diagnosis not present

## 2022-10-15 DIAGNOSIS — I6330 Cerebral infarction due to thrombosis of unspecified cerebral artery: Secondary | ICD-10-CM | POA: Diagnosis not present

## 2022-10-16 DIAGNOSIS — I6330 Cerebral infarction due to thrombosis of unspecified cerebral artery: Secondary | ICD-10-CM | POA: Diagnosis not present

## 2022-10-17 DIAGNOSIS — I6330 Cerebral infarction due to thrombosis of unspecified cerebral artery: Secondary | ICD-10-CM | POA: Diagnosis not present

## 2022-10-18 DIAGNOSIS — I6330 Cerebral infarction due to thrombosis of unspecified cerebral artery: Secondary | ICD-10-CM | POA: Diagnosis not present

## 2022-10-19 DIAGNOSIS — I6330 Cerebral infarction due to thrombosis of unspecified cerebral artery: Secondary | ICD-10-CM | POA: Diagnosis not present

## 2022-10-20 DIAGNOSIS — I6330 Cerebral infarction due to thrombosis of unspecified cerebral artery: Secondary | ICD-10-CM | POA: Diagnosis not present

## 2022-10-21 DIAGNOSIS — I6330 Cerebral infarction due to thrombosis of unspecified cerebral artery: Secondary | ICD-10-CM | POA: Diagnosis not present

## 2022-10-21 DIAGNOSIS — F419 Anxiety disorder, unspecified: Secondary | ICD-10-CM | POA: Diagnosis not present

## 2022-10-22 DIAGNOSIS — I6330 Cerebral infarction due to thrombosis of unspecified cerebral artery: Secondary | ICD-10-CM | POA: Diagnosis not present

## 2022-10-23 DIAGNOSIS — I6330 Cerebral infarction due to thrombosis of unspecified cerebral artery: Secondary | ICD-10-CM | POA: Diagnosis not present

## 2022-10-24 DIAGNOSIS — I6330 Cerebral infarction due to thrombosis of unspecified cerebral artery: Secondary | ICD-10-CM | POA: Diagnosis not present

## 2022-10-25 DIAGNOSIS — I6330 Cerebral infarction due to thrombosis of unspecified cerebral artery: Secondary | ICD-10-CM | POA: Diagnosis not present

## 2022-10-26 DIAGNOSIS — I6330 Cerebral infarction due to thrombosis of unspecified cerebral artery: Secondary | ICD-10-CM | POA: Diagnosis not present

## 2022-10-27 DIAGNOSIS — I6330 Cerebral infarction due to thrombosis of unspecified cerebral artery: Secondary | ICD-10-CM | POA: Diagnosis not present

## 2022-10-28 DIAGNOSIS — I6330 Cerebral infarction due to thrombosis of unspecified cerebral artery: Secondary | ICD-10-CM | POA: Diagnosis not present

## 2022-10-29 DIAGNOSIS — I6330 Cerebral infarction due to thrombosis of unspecified cerebral artery: Secondary | ICD-10-CM | POA: Diagnosis not present

## 2022-10-30 DIAGNOSIS — I6330 Cerebral infarction due to thrombosis of unspecified cerebral artery: Secondary | ICD-10-CM | POA: Diagnosis not present

## 2022-10-31 DIAGNOSIS — I6330 Cerebral infarction due to thrombosis of unspecified cerebral artery: Secondary | ICD-10-CM | POA: Diagnosis not present

## 2022-11-01 DIAGNOSIS — I6330 Cerebral infarction due to thrombosis of unspecified cerebral artery: Secondary | ICD-10-CM | POA: Diagnosis not present

## 2022-11-02 DIAGNOSIS — I6330 Cerebral infarction due to thrombosis of unspecified cerebral artery: Secondary | ICD-10-CM | POA: Diagnosis not present

## 2022-11-03 DIAGNOSIS — I739 Peripheral vascular disease, unspecified: Secondary | ICD-10-CM | POA: Diagnosis not present

## 2022-11-03 DIAGNOSIS — N400 Enlarged prostate without lower urinary tract symptoms: Secondary | ICD-10-CM | POA: Diagnosis not present

## 2022-11-03 DIAGNOSIS — G4700 Insomnia, unspecified: Secondary | ICD-10-CM | POA: Diagnosis not present

## 2022-11-03 DIAGNOSIS — E559 Vitamin D deficiency, unspecified: Secondary | ICD-10-CM | POA: Diagnosis not present

## 2022-11-03 DIAGNOSIS — G8929 Other chronic pain: Secondary | ICD-10-CM | POA: Diagnosis not present

## 2022-11-03 DIAGNOSIS — K5900 Constipation, unspecified: Secondary | ICD-10-CM | POA: Diagnosis not present

## 2022-11-03 DIAGNOSIS — E46 Unspecified protein-calorie malnutrition: Secondary | ICD-10-CM | POA: Diagnosis not present

## 2022-11-03 DIAGNOSIS — I6330 Cerebral infarction due to thrombosis of unspecified cerebral artery: Secondary | ICD-10-CM | POA: Diagnosis not present

## 2022-11-03 DIAGNOSIS — R5381 Other malaise: Secondary | ICD-10-CM | POA: Diagnosis not present

## 2022-11-03 DIAGNOSIS — J439 Emphysema, unspecified: Secondary | ICD-10-CM | POA: Diagnosis not present

## 2022-11-03 DIAGNOSIS — E785 Hyperlipidemia, unspecified: Secondary | ICD-10-CM | POA: Diagnosis not present

## 2022-11-04 DIAGNOSIS — I6330 Cerebral infarction due to thrombosis of unspecified cerebral artery: Secondary | ICD-10-CM | POA: Diagnosis not present

## 2022-11-05 DIAGNOSIS — I6330 Cerebral infarction due to thrombosis of unspecified cerebral artery: Secondary | ICD-10-CM | POA: Diagnosis not present

## 2022-11-06 DIAGNOSIS — Z419 Encounter for procedure for purposes other than remedying health state, unspecified: Secondary | ICD-10-CM | POA: Diagnosis not present

## 2022-11-06 DIAGNOSIS — I6330 Cerebral infarction due to thrombosis of unspecified cerebral artery: Secondary | ICD-10-CM | POA: Diagnosis not present

## 2022-11-07 DIAGNOSIS — I6330 Cerebral infarction due to thrombosis of unspecified cerebral artery: Secondary | ICD-10-CM | POA: Diagnosis not present

## 2022-11-08 DIAGNOSIS — I6330 Cerebral infarction due to thrombosis of unspecified cerebral artery: Secondary | ICD-10-CM | POA: Diagnosis not present

## 2022-11-09 DIAGNOSIS — I6330 Cerebral infarction due to thrombosis of unspecified cerebral artery: Secondary | ICD-10-CM | POA: Diagnosis not present

## 2022-11-10 DIAGNOSIS — I6330 Cerebral infarction due to thrombosis of unspecified cerebral artery: Secondary | ICD-10-CM | POA: Diagnosis not present

## 2022-11-11 DIAGNOSIS — I6330 Cerebral infarction due to thrombosis of unspecified cerebral artery: Secondary | ICD-10-CM | POA: Diagnosis not present

## 2022-11-12 DIAGNOSIS — I6330 Cerebral infarction due to thrombosis of unspecified cerebral artery: Secondary | ICD-10-CM | POA: Diagnosis not present

## 2022-11-13 DIAGNOSIS — I6330 Cerebral infarction due to thrombosis of unspecified cerebral artery: Secondary | ICD-10-CM | POA: Diagnosis not present

## 2022-11-14 DIAGNOSIS — I6330 Cerebral infarction due to thrombosis of unspecified cerebral artery: Secondary | ICD-10-CM | POA: Diagnosis not present

## 2022-11-15 DIAGNOSIS — I6330 Cerebral infarction due to thrombosis of unspecified cerebral artery: Secondary | ICD-10-CM | POA: Diagnosis not present

## 2022-11-16 DIAGNOSIS — I6330 Cerebral infarction due to thrombosis of unspecified cerebral artery: Secondary | ICD-10-CM | POA: Diagnosis not present

## 2022-11-17 DIAGNOSIS — I6330 Cerebral infarction due to thrombosis of unspecified cerebral artery: Secondary | ICD-10-CM | POA: Diagnosis not present

## 2022-11-18 DIAGNOSIS — I6330 Cerebral infarction due to thrombosis of unspecified cerebral artery: Secondary | ICD-10-CM | POA: Diagnosis not present

## 2022-11-19 DIAGNOSIS — I6330 Cerebral infarction due to thrombosis of unspecified cerebral artery: Secondary | ICD-10-CM | POA: Diagnosis not present

## 2022-11-20 DIAGNOSIS — I6330 Cerebral infarction due to thrombosis of unspecified cerebral artery: Secondary | ICD-10-CM | POA: Diagnosis not present

## 2022-11-21 DIAGNOSIS — I6330 Cerebral infarction due to thrombosis of unspecified cerebral artery: Secondary | ICD-10-CM | POA: Diagnosis not present

## 2022-11-22 DIAGNOSIS — I6330 Cerebral infarction due to thrombosis of unspecified cerebral artery: Secondary | ICD-10-CM | POA: Diagnosis not present

## 2022-11-23 DIAGNOSIS — F419 Anxiety disorder, unspecified: Secondary | ICD-10-CM | POA: Diagnosis not present

## 2022-11-23 DIAGNOSIS — I6330 Cerebral infarction due to thrombosis of unspecified cerebral artery: Secondary | ICD-10-CM | POA: Diagnosis not present

## 2022-11-24 DIAGNOSIS — I6330 Cerebral infarction due to thrombosis of unspecified cerebral artery: Secondary | ICD-10-CM | POA: Diagnosis not present

## 2022-11-25 DIAGNOSIS — I6330 Cerebral infarction due to thrombosis of unspecified cerebral artery: Secondary | ICD-10-CM | POA: Diagnosis not present

## 2022-11-26 DIAGNOSIS — I6330 Cerebral infarction due to thrombosis of unspecified cerebral artery: Secondary | ICD-10-CM | POA: Diagnosis not present

## 2022-11-27 DIAGNOSIS — I6330 Cerebral infarction due to thrombosis of unspecified cerebral artery: Secondary | ICD-10-CM | POA: Diagnosis not present

## 2022-11-28 DIAGNOSIS — I6330 Cerebral infarction due to thrombosis of unspecified cerebral artery: Secondary | ICD-10-CM | POA: Diagnosis not present

## 2022-11-29 DIAGNOSIS — I6330 Cerebral infarction due to thrombosis of unspecified cerebral artery: Secondary | ICD-10-CM | POA: Diagnosis not present

## 2022-11-30 DIAGNOSIS — I6330 Cerebral infarction due to thrombosis of unspecified cerebral artery: Secondary | ICD-10-CM | POA: Diagnosis not present

## 2022-12-01 DIAGNOSIS — R5381 Other malaise: Secondary | ICD-10-CM | POA: Diagnosis not present

## 2022-12-01 DIAGNOSIS — N400 Enlarged prostate without lower urinary tract symptoms: Secondary | ICD-10-CM | POA: Diagnosis not present

## 2022-12-01 DIAGNOSIS — I6330 Cerebral infarction due to thrombosis of unspecified cerebral artery: Secondary | ICD-10-CM | POA: Diagnosis not present

## 2022-12-01 DIAGNOSIS — E785 Hyperlipidemia, unspecified: Secondary | ICD-10-CM | POA: Diagnosis not present

## 2022-12-01 DIAGNOSIS — G8929 Other chronic pain: Secondary | ICD-10-CM | POA: Diagnosis not present

## 2022-12-01 DIAGNOSIS — G4700 Insomnia, unspecified: Secondary | ICD-10-CM | POA: Diagnosis not present

## 2022-12-01 DIAGNOSIS — E46 Unspecified protein-calorie malnutrition: Secondary | ICD-10-CM | POA: Diagnosis not present

## 2022-12-01 DIAGNOSIS — E559 Vitamin D deficiency, unspecified: Secondary | ICD-10-CM | POA: Diagnosis not present

## 2022-12-01 DIAGNOSIS — J439 Emphysema, unspecified: Secondary | ICD-10-CM | POA: Diagnosis not present

## 2022-12-02 DIAGNOSIS — I6330 Cerebral infarction due to thrombosis of unspecified cerebral artery: Secondary | ICD-10-CM | POA: Diagnosis not present

## 2022-12-03 DIAGNOSIS — I6330 Cerebral infarction due to thrombosis of unspecified cerebral artery: Secondary | ICD-10-CM | POA: Diagnosis not present

## 2022-12-04 DIAGNOSIS — I6330 Cerebral infarction due to thrombosis of unspecified cerebral artery: Secondary | ICD-10-CM | POA: Diagnosis not present

## 2022-12-05 DIAGNOSIS — I6330 Cerebral infarction due to thrombosis of unspecified cerebral artery: Secondary | ICD-10-CM | POA: Diagnosis not present

## 2022-12-06 DIAGNOSIS — I6330 Cerebral infarction due to thrombosis of unspecified cerebral artery: Secondary | ICD-10-CM | POA: Diagnosis not present

## 2022-12-07 DIAGNOSIS — Z419 Encounter for procedure for purposes other than remedying health state, unspecified: Secondary | ICD-10-CM | POA: Diagnosis not present

## 2022-12-07 DIAGNOSIS — F419 Anxiety disorder, unspecified: Secondary | ICD-10-CM | POA: Diagnosis not present

## 2022-12-07 DIAGNOSIS — I6330 Cerebral infarction due to thrombosis of unspecified cerebral artery: Secondary | ICD-10-CM | POA: Diagnosis not present

## 2022-12-08 DIAGNOSIS — I6330 Cerebral infarction due to thrombosis of unspecified cerebral artery: Secondary | ICD-10-CM | POA: Diagnosis not present

## 2022-12-09 DIAGNOSIS — I6330 Cerebral infarction due to thrombosis of unspecified cerebral artery: Secondary | ICD-10-CM | POA: Diagnosis not present

## 2022-12-10 DIAGNOSIS — I6330 Cerebral infarction due to thrombosis of unspecified cerebral artery: Secondary | ICD-10-CM | POA: Diagnosis not present

## 2022-12-11 DIAGNOSIS — I6330 Cerebral infarction due to thrombosis of unspecified cerebral artery: Secondary | ICD-10-CM | POA: Diagnosis not present

## 2022-12-12 DIAGNOSIS — I6330 Cerebral infarction due to thrombosis of unspecified cerebral artery: Secondary | ICD-10-CM | POA: Diagnosis not present

## 2022-12-13 DIAGNOSIS — I6330 Cerebral infarction due to thrombosis of unspecified cerebral artery: Secondary | ICD-10-CM | POA: Diagnosis not present

## 2022-12-14 DIAGNOSIS — E038 Other specified hypothyroidism: Secondary | ICD-10-CM | POA: Diagnosis not present

## 2022-12-14 DIAGNOSIS — Z79899 Other long term (current) drug therapy: Secondary | ICD-10-CM | POA: Diagnosis not present

## 2022-12-14 DIAGNOSIS — D518 Other vitamin B12 deficiency anemias: Secondary | ICD-10-CM | POA: Diagnosis not present

## 2022-12-14 DIAGNOSIS — E119 Type 2 diabetes mellitus without complications: Secondary | ICD-10-CM | POA: Diagnosis not present

## 2022-12-14 DIAGNOSIS — E782 Mixed hyperlipidemia: Secondary | ICD-10-CM | POA: Diagnosis not present

## 2022-12-14 DIAGNOSIS — I6330 Cerebral infarction due to thrombosis of unspecified cerebral artery: Secondary | ICD-10-CM | POA: Diagnosis not present

## 2022-12-15 DIAGNOSIS — I6330 Cerebral infarction due to thrombosis of unspecified cerebral artery: Secondary | ICD-10-CM | POA: Diagnosis not present

## 2022-12-16 DIAGNOSIS — I6330 Cerebral infarction due to thrombosis of unspecified cerebral artery: Secondary | ICD-10-CM | POA: Diagnosis not present

## 2022-12-17 DIAGNOSIS — I6330 Cerebral infarction due to thrombosis of unspecified cerebral artery: Secondary | ICD-10-CM | POA: Diagnosis not present

## 2022-12-18 DIAGNOSIS — I6330 Cerebral infarction due to thrombosis of unspecified cerebral artery: Secondary | ICD-10-CM | POA: Diagnosis not present

## 2022-12-19 DIAGNOSIS — I6330 Cerebral infarction due to thrombosis of unspecified cerebral artery: Secondary | ICD-10-CM | POA: Diagnosis not present

## 2022-12-20 DIAGNOSIS — I6330 Cerebral infarction due to thrombosis of unspecified cerebral artery: Secondary | ICD-10-CM | POA: Diagnosis not present

## 2022-12-21 DIAGNOSIS — I6330 Cerebral infarction due to thrombosis of unspecified cerebral artery: Secondary | ICD-10-CM | POA: Diagnosis not present

## 2022-12-22 DIAGNOSIS — I6330 Cerebral infarction due to thrombosis of unspecified cerebral artery: Secondary | ICD-10-CM | POA: Diagnosis not present

## 2022-12-23 DIAGNOSIS — I6330 Cerebral infarction due to thrombosis of unspecified cerebral artery: Secondary | ICD-10-CM | POA: Diagnosis not present

## 2022-12-24 DIAGNOSIS — I6330 Cerebral infarction due to thrombosis of unspecified cerebral artery: Secondary | ICD-10-CM | POA: Diagnosis not present

## 2022-12-25 DIAGNOSIS — I6330 Cerebral infarction due to thrombosis of unspecified cerebral artery: Secondary | ICD-10-CM | POA: Diagnosis not present

## 2022-12-26 DIAGNOSIS — I6330 Cerebral infarction due to thrombosis of unspecified cerebral artery: Secondary | ICD-10-CM | POA: Diagnosis not present

## 2022-12-27 DIAGNOSIS — I6330 Cerebral infarction due to thrombosis of unspecified cerebral artery: Secondary | ICD-10-CM | POA: Diagnosis not present

## 2022-12-28 DIAGNOSIS — I6330 Cerebral infarction due to thrombosis of unspecified cerebral artery: Secondary | ICD-10-CM | POA: Diagnosis not present

## 2022-12-29 DIAGNOSIS — K5900 Constipation, unspecified: Secondary | ICD-10-CM | POA: Diagnosis not present

## 2022-12-29 DIAGNOSIS — N400 Enlarged prostate without lower urinary tract symptoms: Secondary | ICD-10-CM | POA: Diagnosis not present

## 2022-12-29 DIAGNOSIS — N189 Chronic kidney disease, unspecified: Secondary | ICD-10-CM | POA: Diagnosis not present

## 2022-12-29 DIAGNOSIS — I6330 Cerebral infarction due to thrombosis of unspecified cerebral artery: Secondary | ICD-10-CM | POA: Diagnosis not present

## 2022-12-29 DIAGNOSIS — I739 Peripheral vascular disease, unspecified: Secondary | ICD-10-CM | POA: Diagnosis not present

## 2022-12-29 DIAGNOSIS — G4700 Insomnia, unspecified: Secondary | ICD-10-CM | POA: Diagnosis not present

## 2022-12-29 DIAGNOSIS — I10 Essential (primary) hypertension: Secondary | ICD-10-CM | POA: Diagnosis not present

## 2022-12-29 DIAGNOSIS — G629 Polyneuropathy, unspecified: Secondary | ICD-10-CM | POA: Diagnosis not present

## 2022-12-29 DIAGNOSIS — E785 Hyperlipidemia, unspecified: Secondary | ICD-10-CM | POA: Diagnosis not present

## 2022-12-29 DIAGNOSIS — R5381 Other malaise: Secondary | ICD-10-CM | POA: Diagnosis not present

## 2022-12-29 DIAGNOSIS — J439 Emphysema, unspecified: Secondary | ICD-10-CM | POA: Diagnosis not present

## 2022-12-29 DIAGNOSIS — G8929 Other chronic pain: Secondary | ICD-10-CM | POA: Diagnosis not present

## 2022-12-30 DIAGNOSIS — F5105 Insomnia due to other mental disorder: Secondary | ICD-10-CM | POA: Diagnosis not present

## 2022-12-30 DIAGNOSIS — F419 Anxiety disorder, unspecified: Secondary | ICD-10-CM | POA: Diagnosis not present

## 2022-12-30 DIAGNOSIS — I6330 Cerebral infarction due to thrombosis of unspecified cerebral artery: Secondary | ICD-10-CM | POA: Diagnosis not present

## 2022-12-31 DIAGNOSIS — I6330 Cerebral infarction due to thrombosis of unspecified cerebral artery: Secondary | ICD-10-CM | POA: Diagnosis not present

## 2023-01-01 DIAGNOSIS — I6330 Cerebral infarction due to thrombosis of unspecified cerebral artery: Secondary | ICD-10-CM | POA: Diagnosis not present

## 2023-01-02 DIAGNOSIS — I6330 Cerebral infarction due to thrombosis of unspecified cerebral artery: Secondary | ICD-10-CM | POA: Diagnosis not present

## 2023-01-03 DIAGNOSIS — I6330 Cerebral infarction due to thrombosis of unspecified cerebral artery: Secondary | ICD-10-CM | POA: Diagnosis not present

## 2023-01-04 DIAGNOSIS — E782 Mixed hyperlipidemia: Secondary | ICD-10-CM | POA: Diagnosis not present

## 2023-01-04 DIAGNOSIS — Z79899 Other long term (current) drug therapy: Secondary | ICD-10-CM | POA: Diagnosis not present

## 2023-01-04 DIAGNOSIS — E119 Type 2 diabetes mellitus without complications: Secondary | ICD-10-CM | POA: Diagnosis not present

## 2023-01-04 DIAGNOSIS — I6330 Cerebral infarction due to thrombosis of unspecified cerebral artery: Secondary | ICD-10-CM | POA: Diagnosis not present

## 2023-01-04 DIAGNOSIS — D518 Other vitamin B12 deficiency anemias: Secondary | ICD-10-CM | POA: Diagnosis not present

## 2023-01-05 DIAGNOSIS — I6330 Cerebral infarction due to thrombosis of unspecified cerebral artery: Secondary | ICD-10-CM | POA: Diagnosis not present

## 2023-01-06 DIAGNOSIS — Z419 Encounter for procedure for purposes other than remedying health state, unspecified: Secondary | ICD-10-CM | POA: Diagnosis not present

## 2023-01-06 DIAGNOSIS — I6330 Cerebral infarction due to thrombosis of unspecified cerebral artery: Secondary | ICD-10-CM | POA: Diagnosis not present

## 2023-01-07 DIAGNOSIS — I6330 Cerebral infarction due to thrombosis of unspecified cerebral artery: Secondary | ICD-10-CM | POA: Diagnosis not present

## 2023-01-08 DIAGNOSIS — I6330 Cerebral infarction due to thrombosis of unspecified cerebral artery: Secondary | ICD-10-CM | POA: Diagnosis not present

## 2023-01-09 DIAGNOSIS — I6330 Cerebral infarction due to thrombosis of unspecified cerebral artery: Secondary | ICD-10-CM | POA: Diagnosis not present

## 2023-01-10 DIAGNOSIS — I6330 Cerebral infarction due to thrombosis of unspecified cerebral artery: Secondary | ICD-10-CM | POA: Diagnosis not present

## 2023-01-11 DIAGNOSIS — I6330 Cerebral infarction due to thrombosis of unspecified cerebral artery: Secondary | ICD-10-CM | POA: Diagnosis not present

## 2023-01-12 DIAGNOSIS — I6330 Cerebral infarction due to thrombosis of unspecified cerebral artery: Secondary | ICD-10-CM | POA: Diagnosis not present

## 2023-01-13 DIAGNOSIS — I6330 Cerebral infarction due to thrombosis of unspecified cerebral artery: Secondary | ICD-10-CM | POA: Diagnosis not present

## 2023-01-14 DIAGNOSIS — I6330 Cerebral infarction due to thrombosis of unspecified cerebral artery: Secondary | ICD-10-CM | POA: Diagnosis not present

## 2023-01-15 DIAGNOSIS — I6330 Cerebral infarction due to thrombosis of unspecified cerebral artery: Secondary | ICD-10-CM | POA: Diagnosis not present

## 2023-01-15 DIAGNOSIS — F5105 Insomnia due to other mental disorder: Secondary | ICD-10-CM | POA: Diagnosis not present

## 2023-01-15 DIAGNOSIS — F419 Anxiety disorder, unspecified: Secondary | ICD-10-CM | POA: Diagnosis not present

## 2023-01-16 DIAGNOSIS — I6330 Cerebral infarction due to thrombosis of unspecified cerebral artery: Secondary | ICD-10-CM | POA: Diagnosis not present

## 2023-01-17 DIAGNOSIS — I6330 Cerebral infarction due to thrombosis of unspecified cerebral artery: Secondary | ICD-10-CM | POA: Diagnosis not present

## 2023-01-18 DIAGNOSIS — I6330 Cerebral infarction due to thrombosis of unspecified cerebral artery: Secondary | ICD-10-CM | POA: Diagnosis not present

## 2023-01-19 DIAGNOSIS — I6330 Cerebral infarction due to thrombosis of unspecified cerebral artery: Secondary | ICD-10-CM | POA: Diagnosis not present

## 2023-01-19 DIAGNOSIS — Z79899 Other long term (current) drug therapy: Secondary | ICD-10-CM | POA: Diagnosis not present

## 2023-01-19 DIAGNOSIS — F5105 Insomnia due to other mental disorder: Secondary | ICD-10-CM | POA: Diagnosis not present

## 2023-01-19 DIAGNOSIS — F419 Anxiety disorder, unspecified: Secondary | ICD-10-CM | POA: Diagnosis not present

## 2023-01-20 DIAGNOSIS — I6330 Cerebral infarction due to thrombosis of unspecified cerebral artery: Secondary | ICD-10-CM | POA: Diagnosis not present

## 2023-01-21 DIAGNOSIS — I6330 Cerebral infarction due to thrombosis of unspecified cerebral artery: Secondary | ICD-10-CM | POA: Diagnosis not present

## 2023-01-22 DIAGNOSIS — I6330 Cerebral infarction due to thrombosis of unspecified cerebral artery: Secondary | ICD-10-CM | POA: Diagnosis not present

## 2023-01-23 DIAGNOSIS — I6330 Cerebral infarction due to thrombosis of unspecified cerebral artery: Secondary | ICD-10-CM | POA: Diagnosis not present

## 2023-01-24 DIAGNOSIS — I6330 Cerebral infarction due to thrombosis of unspecified cerebral artery: Secondary | ICD-10-CM | POA: Diagnosis not present

## 2023-01-25 DIAGNOSIS — I6330 Cerebral infarction due to thrombosis of unspecified cerebral artery: Secondary | ICD-10-CM | POA: Diagnosis not present

## 2023-01-26 DIAGNOSIS — G629 Polyneuropathy, unspecified: Secondary | ICD-10-CM | POA: Diagnosis not present

## 2023-01-26 DIAGNOSIS — J439 Emphysema, unspecified: Secondary | ICD-10-CM | POA: Diagnosis not present

## 2023-01-26 DIAGNOSIS — H04123 Dry eye syndrome of bilateral lacrimal glands: Secondary | ICD-10-CM | POA: Diagnosis not present

## 2023-01-26 DIAGNOSIS — K5900 Constipation, unspecified: Secondary | ICD-10-CM | POA: Diagnosis not present

## 2023-01-26 DIAGNOSIS — G4700 Insomnia, unspecified: Secondary | ICD-10-CM | POA: Diagnosis not present

## 2023-01-26 DIAGNOSIS — I6330 Cerebral infarction due to thrombosis of unspecified cerebral artery: Secondary | ICD-10-CM | POA: Diagnosis not present

## 2023-01-26 DIAGNOSIS — E785 Hyperlipidemia, unspecified: Secondary | ICD-10-CM | POA: Diagnosis not present

## 2023-01-26 DIAGNOSIS — E559 Vitamin D deficiency, unspecified: Secondary | ICD-10-CM | POA: Diagnosis not present

## 2023-01-26 DIAGNOSIS — E46 Unspecified protein-calorie malnutrition: Secondary | ICD-10-CM | POA: Diagnosis not present

## 2023-01-26 DIAGNOSIS — I739 Peripheral vascular disease, unspecified: Secondary | ICD-10-CM | POA: Diagnosis not present

## 2023-01-26 DIAGNOSIS — F419 Anxiety disorder, unspecified: Secondary | ICD-10-CM | POA: Diagnosis not present

## 2023-01-26 DIAGNOSIS — N189 Chronic kidney disease, unspecified: Secondary | ICD-10-CM | POA: Diagnosis not present

## 2023-01-26 DIAGNOSIS — I10 Essential (primary) hypertension: Secondary | ICD-10-CM | POA: Diagnosis not present

## 2023-01-26 DIAGNOSIS — F5105 Insomnia due to other mental disorder: Secondary | ICD-10-CM | POA: Diagnosis not present

## 2023-01-27 DIAGNOSIS — I6330 Cerebral infarction due to thrombosis of unspecified cerebral artery: Secondary | ICD-10-CM | POA: Diagnosis not present

## 2023-01-28 DIAGNOSIS — I6330 Cerebral infarction due to thrombosis of unspecified cerebral artery: Secondary | ICD-10-CM | POA: Diagnosis not present

## 2023-01-29 DIAGNOSIS — I6330 Cerebral infarction due to thrombosis of unspecified cerebral artery: Secondary | ICD-10-CM | POA: Diagnosis not present

## 2023-01-30 DIAGNOSIS — I6330 Cerebral infarction due to thrombosis of unspecified cerebral artery: Secondary | ICD-10-CM | POA: Diagnosis not present

## 2023-01-31 DIAGNOSIS — I6330 Cerebral infarction due to thrombosis of unspecified cerebral artery: Secondary | ICD-10-CM | POA: Diagnosis not present

## 2023-02-01 DIAGNOSIS — I6330 Cerebral infarction due to thrombosis of unspecified cerebral artery: Secondary | ICD-10-CM | POA: Diagnosis not present

## 2023-02-02 DIAGNOSIS — I6330 Cerebral infarction due to thrombosis of unspecified cerebral artery: Secondary | ICD-10-CM | POA: Diagnosis not present

## 2023-02-03 DIAGNOSIS — I6330 Cerebral infarction due to thrombosis of unspecified cerebral artery: Secondary | ICD-10-CM | POA: Diagnosis not present

## 2023-02-04 DIAGNOSIS — I6330 Cerebral infarction due to thrombosis of unspecified cerebral artery: Secondary | ICD-10-CM | POA: Diagnosis not present

## 2023-02-05 DIAGNOSIS — I6330 Cerebral infarction due to thrombosis of unspecified cerebral artery: Secondary | ICD-10-CM | POA: Diagnosis not present

## 2023-02-06 DIAGNOSIS — I6330 Cerebral infarction due to thrombosis of unspecified cerebral artery: Secondary | ICD-10-CM | POA: Diagnosis not present

## 2023-02-06 DIAGNOSIS — Z419 Encounter for procedure for purposes other than remedying health state, unspecified: Secondary | ICD-10-CM | POA: Diagnosis not present

## 2023-02-07 DIAGNOSIS — I6330 Cerebral infarction due to thrombosis of unspecified cerebral artery: Secondary | ICD-10-CM | POA: Diagnosis not present

## 2023-02-08 DIAGNOSIS — I6330 Cerebral infarction due to thrombosis of unspecified cerebral artery: Secondary | ICD-10-CM | POA: Diagnosis not present

## 2023-02-09 DIAGNOSIS — I6330 Cerebral infarction due to thrombosis of unspecified cerebral artery: Secondary | ICD-10-CM | POA: Diagnosis not present

## 2023-02-10 DIAGNOSIS — I6330 Cerebral infarction due to thrombosis of unspecified cerebral artery: Secondary | ICD-10-CM | POA: Diagnosis not present

## 2023-02-11 DIAGNOSIS — I6330 Cerebral infarction due to thrombosis of unspecified cerebral artery: Secondary | ICD-10-CM | POA: Diagnosis not present

## 2023-02-12 DIAGNOSIS — I6330 Cerebral infarction due to thrombosis of unspecified cerebral artery: Secondary | ICD-10-CM | POA: Diagnosis not present

## 2023-02-13 DIAGNOSIS — I6330 Cerebral infarction due to thrombosis of unspecified cerebral artery: Secondary | ICD-10-CM | POA: Diagnosis not present

## 2023-02-14 DIAGNOSIS — I6330 Cerebral infarction due to thrombosis of unspecified cerebral artery: Secondary | ICD-10-CM | POA: Diagnosis not present

## 2023-02-15 DIAGNOSIS — I6330 Cerebral infarction due to thrombosis of unspecified cerebral artery: Secondary | ICD-10-CM | POA: Diagnosis not present

## 2023-02-16 DIAGNOSIS — I6330 Cerebral infarction due to thrombosis of unspecified cerebral artery: Secondary | ICD-10-CM | POA: Diagnosis not present

## 2023-02-17 DIAGNOSIS — I6330 Cerebral infarction due to thrombosis of unspecified cerebral artery: Secondary | ICD-10-CM | POA: Diagnosis not present

## 2023-02-18 DIAGNOSIS — I6330 Cerebral infarction due to thrombosis of unspecified cerebral artery: Secondary | ICD-10-CM | POA: Diagnosis not present

## 2023-02-19 DIAGNOSIS — I6330 Cerebral infarction due to thrombosis of unspecified cerebral artery: Secondary | ICD-10-CM | POA: Diagnosis not present

## 2023-02-20 DIAGNOSIS — I6330 Cerebral infarction due to thrombosis of unspecified cerebral artery: Secondary | ICD-10-CM | POA: Diagnosis not present

## 2023-02-21 DIAGNOSIS — I6330 Cerebral infarction due to thrombosis of unspecified cerebral artery: Secondary | ICD-10-CM | POA: Diagnosis not present

## 2023-02-22 DIAGNOSIS — I6330 Cerebral infarction due to thrombosis of unspecified cerebral artery: Secondary | ICD-10-CM | POA: Diagnosis not present

## 2023-02-23 DIAGNOSIS — I6330 Cerebral infarction due to thrombosis of unspecified cerebral artery: Secondary | ICD-10-CM | POA: Diagnosis not present

## 2023-02-24 DIAGNOSIS — F5105 Insomnia due to other mental disorder: Secondary | ICD-10-CM | POA: Diagnosis not present

## 2023-02-24 DIAGNOSIS — I6330 Cerebral infarction due to thrombosis of unspecified cerebral artery: Secondary | ICD-10-CM | POA: Diagnosis not present

## 2023-02-24 DIAGNOSIS — F419 Anxiety disorder, unspecified: Secondary | ICD-10-CM | POA: Diagnosis not present

## 2023-02-25 DIAGNOSIS — I6330 Cerebral infarction due to thrombosis of unspecified cerebral artery: Secondary | ICD-10-CM | POA: Diagnosis not present

## 2023-02-26 DIAGNOSIS — I6330 Cerebral infarction due to thrombosis of unspecified cerebral artery: Secondary | ICD-10-CM | POA: Diagnosis not present

## 2023-02-27 DIAGNOSIS — I6330 Cerebral infarction due to thrombosis of unspecified cerebral artery: Secondary | ICD-10-CM | POA: Diagnosis not present

## 2023-02-28 DIAGNOSIS — I6330 Cerebral infarction due to thrombosis of unspecified cerebral artery: Secondary | ICD-10-CM | POA: Diagnosis not present

## 2023-03-01 DIAGNOSIS — I6330 Cerebral infarction due to thrombosis of unspecified cerebral artery: Secondary | ICD-10-CM | POA: Diagnosis not present

## 2023-03-01 DIAGNOSIS — F419 Anxiety disorder, unspecified: Secondary | ICD-10-CM | POA: Diagnosis not present

## 2023-03-01 DIAGNOSIS — F5105 Insomnia due to other mental disorder: Secondary | ICD-10-CM | POA: Diagnosis not present

## 2023-03-02 DIAGNOSIS — G4700 Insomnia, unspecified: Secondary | ICD-10-CM | POA: Diagnosis not present

## 2023-03-02 DIAGNOSIS — I6330 Cerebral infarction due to thrombosis of unspecified cerebral artery: Secondary | ICD-10-CM | POA: Diagnosis not present

## 2023-03-02 DIAGNOSIS — E785 Hyperlipidemia, unspecified: Secondary | ICD-10-CM | POA: Diagnosis not present

## 2023-03-02 DIAGNOSIS — J439 Emphysema, unspecified: Secondary | ICD-10-CM | POA: Diagnosis not present

## 2023-03-02 DIAGNOSIS — K5900 Constipation, unspecified: Secondary | ICD-10-CM | POA: Diagnosis not present

## 2023-03-02 DIAGNOSIS — G8929 Other chronic pain: Secondary | ICD-10-CM | POA: Diagnosis not present

## 2023-03-02 DIAGNOSIS — H04123 Dry eye syndrome of bilateral lacrimal glands: Secondary | ICD-10-CM | POA: Diagnosis not present

## 2023-03-02 DIAGNOSIS — G629 Polyneuropathy, unspecified: Secondary | ICD-10-CM | POA: Diagnosis not present

## 2023-03-02 DIAGNOSIS — N189 Chronic kidney disease, unspecified: Secondary | ICD-10-CM | POA: Diagnosis not present

## 2023-03-02 DIAGNOSIS — I10 Essential (primary) hypertension: Secondary | ICD-10-CM | POA: Diagnosis not present

## 2023-03-02 DIAGNOSIS — I739 Peripheral vascular disease, unspecified: Secondary | ICD-10-CM | POA: Diagnosis not present

## 2023-03-02 DIAGNOSIS — R5381 Other malaise: Secondary | ICD-10-CM | POA: Diagnosis not present

## 2023-03-03 DIAGNOSIS — I6330 Cerebral infarction due to thrombosis of unspecified cerebral artery: Secondary | ICD-10-CM | POA: Diagnosis not present

## 2023-03-04 DIAGNOSIS — I6330 Cerebral infarction due to thrombosis of unspecified cerebral artery: Secondary | ICD-10-CM | POA: Diagnosis not present

## 2023-03-05 DIAGNOSIS — I6330 Cerebral infarction due to thrombosis of unspecified cerebral artery: Secondary | ICD-10-CM | POA: Diagnosis not present

## 2023-03-06 DIAGNOSIS — I6330 Cerebral infarction due to thrombosis of unspecified cerebral artery: Secondary | ICD-10-CM | POA: Diagnosis not present

## 2023-03-07 DIAGNOSIS — I6330 Cerebral infarction due to thrombosis of unspecified cerebral artery: Secondary | ICD-10-CM | POA: Diagnosis not present

## 2023-03-08 DIAGNOSIS — Z419 Encounter for procedure for purposes other than remedying health state, unspecified: Secondary | ICD-10-CM | POA: Diagnosis not present

## 2023-03-08 DIAGNOSIS — I6330 Cerebral infarction due to thrombosis of unspecified cerebral artery: Secondary | ICD-10-CM | POA: Diagnosis not present

## 2023-03-09 DIAGNOSIS — I6330 Cerebral infarction due to thrombosis of unspecified cerebral artery: Secondary | ICD-10-CM | POA: Diagnosis not present

## 2023-03-10 DIAGNOSIS — D518 Other vitamin B12 deficiency anemias: Secondary | ICD-10-CM | POA: Diagnosis not present

## 2023-03-10 DIAGNOSIS — E119 Type 2 diabetes mellitus without complications: Secondary | ICD-10-CM | POA: Diagnosis not present

## 2023-03-10 DIAGNOSIS — Z79899 Other long term (current) drug therapy: Secondary | ICD-10-CM | POA: Diagnosis not present

## 2023-03-10 DIAGNOSIS — I6330 Cerebral infarction due to thrombosis of unspecified cerebral artery: Secondary | ICD-10-CM | POA: Diagnosis not present

## 2023-03-10 DIAGNOSIS — E782 Mixed hyperlipidemia: Secondary | ICD-10-CM | POA: Diagnosis not present

## 2023-03-10 DIAGNOSIS — E038 Other specified hypothyroidism: Secondary | ICD-10-CM | POA: Diagnosis not present

## 2023-03-11 DIAGNOSIS — I6330 Cerebral infarction due to thrombosis of unspecified cerebral artery: Secondary | ICD-10-CM | POA: Diagnosis not present

## 2023-03-12 DIAGNOSIS — I6330 Cerebral infarction due to thrombosis of unspecified cerebral artery: Secondary | ICD-10-CM | POA: Diagnosis not present

## 2023-03-13 DIAGNOSIS — I6330 Cerebral infarction due to thrombosis of unspecified cerebral artery: Secondary | ICD-10-CM | POA: Diagnosis not present

## 2023-03-14 DIAGNOSIS — I6330 Cerebral infarction due to thrombosis of unspecified cerebral artery: Secondary | ICD-10-CM | POA: Diagnosis not present

## 2023-03-15 DIAGNOSIS — I6330 Cerebral infarction due to thrombosis of unspecified cerebral artery: Secondary | ICD-10-CM | POA: Diagnosis not present

## 2023-03-16 DIAGNOSIS — I6330 Cerebral infarction due to thrombosis of unspecified cerebral artery: Secondary | ICD-10-CM | POA: Diagnosis not present

## 2023-03-17 DIAGNOSIS — I6330 Cerebral infarction due to thrombosis of unspecified cerebral artery: Secondary | ICD-10-CM | POA: Diagnosis not present

## 2023-03-18 DIAGNOSIS — I6330 Cerebral infarction due to thrombosis of unspecified cerebral artery: Secondary | ICD-10-CM | POA: Diagnosis not present

## 2023-03-19 DIAGNOSIS — I6330 Cerebral infarction due to thrombosis of unspecified cerebral artery: Secondary | ICD-10-CM | POA: Diagnosis not present

## 2023-03-20 DIAGNOSIS — I6330 Cerebral infarction due to thrombosis of unspecified cerebral artery: Secondary | ICD-10-CM | POA: Diagnosis not present

## 2023-03-21 DIAGNOSIS — I6330 Cerebral infarction due to thrombosis of unspecified cerebral artery: Secondary | ICD-10-CM | POA: Diagnosis not present

## 2023-03-22 DIAGNOSIS — E119 Type 2 diabetes mellitus without complications: Secondary | ICD-10-CM | POA: Diagnosis not present

## 2023-03-22 DIAGNOSIS — E782 Mixed hyperlipidemia: Secondary | ICD-10-CM | POA: Diagnosis not present

## 2023-03-22 DIAGNOSIS — I6330 Cerebral infarction due to thrombosis of unspecified cerebral artery: Secondary | ICD-10-CM | POA: Diagnosis not present

## 2023-03-22 DIAGNOSIS — Z79899 Other long term (current) drug therapy: Secondary | ICD-10-CM | POA: Diagnosis not present

## 2023-03-22 DIAGNOSIS — D518 Other vitamin B12 deficiency anemias: Secondary | ICD-10-CM | POA: Diagnosis not present

## 2023-03-23 DIAGNOSIS — K5900 Constipation, unspecified: Secondary | ICD-10-CM | POA: Diagnosis not present

## 2023-03-23 DIAGNOSIS — I739 Peripheral vascular disease, unspecified: Secondary | ICD-10-CM | POA: Diagnosis not present

## 2023-03-23 DIAGNOSIS — J439 Emphysema, unspecified: Secondary | ICD-10-CM | POA: Diagnosis not present

## 2023-03-23 DIAGNOSIS — I6330 Cerebral infarction due to thrombosis of unspecified cerebral artery: Secondary | ICD-10-CM | POA: Diagnosis not present

## 2023-03-23 DIAGNOSIS — N400 Enlarged prostate without lower urinary tract symptoms: Secondary | ICD-10-CM | POA: Diagnosis not present

## 2023-03-23 DIAGNOSIS — E46 Unspecified protein-calorie malnutrition: Secondary | ICD-10-CM | POA: Diagnosis not present

## 2023-03-23 DIAGNOSIS — I10 Essential (primary) hypertension: Secondary | ICD-10-CM | POA: Diagnosis not present

## 2023-03-23 DIAGNOSIS — E559 Vitamin D deficiency, unspecified: Secondary | ICD-10-CM | POA: Diagnosis not present

## 2023-03-23 DIAGNOSIS — H04123 Dry eye syndrome of bilateral lacrimal glands: Secondary | ICD-10-CM | POA: Diagnosis not present

## 2023-03-23 DIAGNOSIS — N183 Chronic kidney disease, stage 3 unspecified: Secondary | ICD-10-CM | POA: Diagnosis not present

## 2023-03-24 DIAGNOSIS — I6330 Cerebral infarction due to thrombosis of unspecified cerebral artery: Secondary | ICD-10-CM | POA: Diagnosis not present

## 2023-03-25 DIAGNOSIS — I6330 Cerebral infarction due to thrombosis of unspecified cerebral artery: Secondary | ICD-10-CM | POA: Diagnosis not present

## 2023-03-26 DIAGNOSIS — I6330 Cerebral infarction due to thrombosis of unspecified cerebral artery: Secondary | ICD-10-CM | POA: Diagnosis not present

## 2023-03-27 DIAGNOSIS — I6330 Cerebral infarction due to thrombosis of unspecified cerebral artery: Secondary | ICD-10-CM | POA: Diagnosis not present

## 2023-03-28 DIAGNOSIS — I6330 Cerebral infarction due to thrombosis of unspecified cerebral artery: Secondary | ICD-10-CM | POA: Diagnosis not present

## 2023-03-29 DIAGNOSIS — I6330 Cerebral infarction due to thrombosis of unspecified cerebral artery: Secondary | ICD-10-CM | POA: Diagnosis not present

## 2023-03-30 DIAGNOSIS — I739 Peripheral vascular disease, unspecified: Secondary | ICD-10-CM | POA: Diagnosis not present

## 2023-03-30 DIAGNOSIS — I6330 Cerebral infarction due to thrombosis of unspecified cerebral artery: Secondary | ICD-10-CM | POA: Diagnosis not present

## 2023-03-30 DIAGNOSIS — F419 Anxiety disorder, unspecified: Secondary | ICD-10-CM | POA: Diagnosis not present

## 2023-03-30 DIAGNOSIS — G4700 Insomnia, unspecified: Secondary | ICD-10-CM | POA: Diagnosis not present

## 2023-03-30 DIAGNOSIS — G629 Polyneuropathy, unspecified: Secondary | ICD-10-CM | POA: Diagnosis not present

## 2023-03-30 DIAGNOSIS — F5105 Insomnia due to other mental disorder: Secondary | ICD-10-CM | POA: Diagnosis not present

## 2023-03-30 DIAGNOSIS — I10 Essential (primary) hypertension: Secondary | ICD-10-CM | POA: Diagnosis not present

## 2023-03-30 DIAGNOSIS — N183 Chronic kidney disease, stage 3 unspecified: Secondary | ICD-10-CM | POA: Diagnosis not present

## 2023-03-30 DIAGNOSIS — N189 Chronic kidney disease, unspecified: Secondary | ICD-10-CM | POA: Diagnosis not present

## 2023-03-30 DIAGNOSIS — N400 Enlarged prostate without lower urinary tract symptoms: Secondary | ICD-10-CM | POA: Diagnosis not present

## 2023-03-30 DIAGNOSIS — G8929 Other chronic pain: Secondary | ICD-10-CM | POA: Diagnosis not present

## 2023-03-30 DIAGNOSIS — J441 Chronic obstructive pulmonary disease with (acute) exacerbation: Secondary | ICD-10-CM | POA: Diagnosis not present

## 2023-03-30 DIAGNOSIS — H04123 Dry eye syndrome of bilateral lacrimal glands: Secondary | ICD-10-CM | POA: Diagnosis not present

## 2023-03-30 DIAGNOSIS — E785 Hyperlipidemia, unspecified: Secondary | ICD-10-CM | POA: Diagnosis not present

## 2023-03-31 DIAGNOSIS — I6330 Cerebral infarction due to thrombosis of unspecified cerebral artery: Secondary | ICD-10-CM | POA: Diagnosis not present

## 2023-04-01 DIAGNOSIS — I6330 Cerebral infarction due to thrombosis of unspecified cerebral artery: Secondary | ICD-10-CM | POA: Diagnosis not present

## 2023-04-02 DIAGNOSIS — I6330 Cerebral infarction due to thrombosis of unspecified cerebral artery: Secondary | ICD-10-CM | POA: Diagnosis not present

## 2023-04-03 DIAGNOSIS — I6330 Cerebral infarction due to thrombosis of unspecified cerebral artery: Secondary | ICD-10-CM | POA: Diagnosis not present

## 2023-04-04 DIAGNOSIS — I6330 Cerebral infarction due to thrombosis of unspecified cerebral artery: Secondary | ICD-10-CM | POA: Diagnosis not present

## 2023-04-05 DIAGNOSIS — I6330 Cerebral infarction due to thrombosis of unspecified cerebral artery: Secondary | ICD-10-CM | POA: Diagnosis not present

## 2023-04-06 DIAGNOSIS — I6330 Cerebral infarction due to thrombosis of unspecified cerebral artery: Secondary | ICD-10-CM | POA: Diagnosis not present

## 2023-04-07 DIAGNOSIS — I6330 Cerebral infarction due to thrombosis of unspecified cerebral artery: Secondary | ICD-10-CM | POA: Diagnosis not present

## 2023-04-08 DIAGNOSIS — Z419 Encounter for procedure for purposes other than remedying health state, unspecified: Secondary | ICD-10-CM | POA: Diagnosis not present

## 2023-04-13 DIAGNOSIS — R0602 Shortness of breath: Secondary | ICD-10-CM | POA: Diagnosis not present

## 2023-04-13 DIAGNOSIS — F17210 Nicotine dependence, cigarettes, uncomplicated: Secondary | ICD-10-CM | POA: Diagnosis not present

## 2023-04-13 DIAGNOSIS — J432 Centrilobular emphysema: Secondary | ICD-10-CM | POA: Diagnosis not present

## 2023-04-29 DIAGNOSIS — I6330 Cerebral infarction due to thrombosis of unspecified cerebral artery: Secondary | ICD-10-CM | POA: Diagnosis not present

## 2023-04-30 DIAGNOSIS — I6330 Cerebral infarction due to thrombosis of unspecified cerebral artery: Secondary | ICD-10-CM | POA: Diagnosis not present

## 2023-05-01 DIAGNOSIS — I6330 Cerebral infarction due to thrombosis of unspecified cerebral artery: Secondary | ICD-10-CM | POA: Diagnosis not present

## 2023-05-02 DIAGNOSIS — I6330 Cerebral infarction due to thrombosis of unspecified cerebral artery: Secondary | ICD-10-CM | POA: Diagnosis not present

## 2023-05-03 DIAGNOSIS — I6330 Cerebral infarction due to thrombosis of unspecified cerebral artery: Secondary | ICD-10-CM | POA: Diagnosis not present

## 2023-05-04 DIAGNOSIS — I6330 Cerebral infarction due to thrombosis of unspecified cerebral artery: Secondary | ICD-10-CM | POA: Diagnosis not present

## 2023-05-05 DIAGNOSIS — I6330 Cerebral infarction due to thrombosis of unspecified cerebral artery: Secondary | ICD-10-CM | POA: Diagnosis not present

## 2023-05-06 DIAGNOSIS — I6330 Cerebral infarction due to thrombosis of unspecified cerebral artery: Secondary | ICD-10-CM | POA: Diagnosis not present

## 2023-05-07 DIAGNOSIS — I6330 Cerebral infarction due to thrombosis of unspecified cerebral artery: Secondary | ICD-10-CM | POA: Diagnosis not present

## 2023-05-08 DIAGNOSIS — I6330 Cerebral infarction due to thrombosis of unspecified cerebral artery: Secondary | ICD-10-CM | POA: Diagnosis not present

## 2023-05-09 DIAGNOSIS — I6330 Cerebral infarction due to thrombosis of unspecified cerebral artery: Secondary | ICD-10-CM | POA: Diagnosis not present

## 2023-05-09 DIAGNOSIS — Z419 Encounter for procedure for purposes other than remedying health state, unspecified: Secondary | ICD-10-CM | POA: Diagnosis not present

## 2023-05-10 DIAGNOSIS — I6330 Cerebral infarction due to thrombosis of unspecified cerebral artery: Secondary | ICD-10-CM | POA: Diagnosis not present

## 2023-05-11 DIAGNOSIS — G4700 Insomnia, unspecified: Secondary | ICD-10-CM | POA: Diagnosis not present

## 2023-05-11 DIAGNOSIS — E559 Vitamin D deficiency, unspecified: Secondary | ICD-10-CM | POA: Diagnosis not present

## 2023-05-11 DIAGNOSIS — E785 Hyperlipidemia, unspecified: Secondary | ICD-10-CM | POA: Diagnosis not present

## 2023-05-11 DIAGNOSIS — I6330 Cerebral infarction due to thrombosis of unspecified cerebral artery: Secondary | ICD-10-CM | POA: Diagnosis not present

## 2023-05-11 DIAGNOSIS — G629 Polyneuropathy, unspecified: Secondary | ICD-10-CM | POA: Diagnosis not present

## 2023-05-11 DIAGNOSIS — N189 Chronic kidney disease, unspecified: Secondary | ICD-10-CM | POA: Diagnosis not present

## 2023-05-11 DIAGNOSIS — G8929 Other chronic pain: Secondary | ICD-10-CM | POA: Diagnosis not present

## 2023-05-11 DIAGNOSIS — N400 Enlarged prostate without lower urinary tract symptoms: Secondary | ICD-10-CM | POA: Diagnosis not present

## 2023-05-11 DIAGNOSIS — I739 Peripheral vascular disease, unspecified: Secondary | ICD-10-CM | POA: Diagnosis not present

## 2023-05-11 DIAGNOSIS — K5900 Constipation, unspecified: Secondary | ICD-10-CM | POA: Diagnosis not present

## 2023-05-11 DIAGNOSIS — J439 Emphysema, unspecified: Secondary | ICD-10-CM | POA: Diagnosis not present

## 2023-05-12 DIAGNOSIS — I6330 Cerebral infarction due to thrombosis of unspecified cerebral artery: Secondary | ICD-10-CM | POA: Diagnosis not present

## 2023-05-13 DIAGNOSIS — I6330 Cerebral infarction due to thrombosis of unspecified cerebral artery: Secondary | ICD-10-CM | POA: Diagnosis not present

## 2023-05-14 DIAGNOSIS — I6330 Cerebral infarction due to thrombosis of unspecified cerebral artery: Secondary | ICD-10-CM | POA: Diagnosis not present

## 2023-05-15 DIAGNOSIS — I6330 Cerebral infarction due to thrombosis of unspecified cerebral artery: Secondary | ICD-10-CM | POA: Diagnosis not present

## 2023-05-16 DIAGNOSIS — I6330 Cerebral infarction due to thrombosis of unspecified cerebral artery: Secondary | ICD-10-CM | POA: Diagnosis not present

## 2023-05-17 DIAGNOSIS — I6330 Cerebral infarction due to thrombosis of unspecified cerebral artery: Secondary | ICD-10-CM | POA: Diagnosis not present

## 2023-05-17 DIAGNOSIS — F5105 Insomnia due to other mental disorder: Secondary | ICD-10-CM | POA: Diagnosis not present

## 2023-05-17 DIAGNOSIS — F419 Anxiety disorder, unspecified: Secondary | ICD-10-CM | POA: Diagnosis not present

## 2023-05-18 DIAGNOSIS — I6330 Cerebral infarction due to thrombosis of unspecified cerebral artery: Secondary | ICD-10-CM | POA: Diagnosis not present

## 2023-05-19 DIAGNOSIS — I6330 Cerebral infarction due to thrombosis of unspecified cerebral artery: Secondary | ICD-10-CM | POA: Diagnosis not present

## 2023-05-20 DIAGNOSIS — I6330 Cerebral infarction due to thrombosis of unspecified cerebral artery: Secondary | ICD-10-CM | POA: Diagnosis not present

## 2023-05-21 DIAGNOSIS — I6330 Cerebral infarction due to thrombosis of unspecified cerebral artery: Secondary | ICD-10-CM | POA: Diagnosis not present

## 2023-05-22 DIAGNOSIS — I6330 Cerebral infarction due to thrombosis of unspecified cerebral artery: Secondary | ICD-10-CM | POA: Diagnosis not present

## 2023-05-23 DIAGNOSIS — I6330 Cerebral infarction due to thrombosis of unspecified cerebral artery: Secondary | ICD-10-CM | POA: Diagnosis not present

## 2023-05-24 DIAGNOSIS — I6330 Cerebral infarction due to thrombosis of unspecified cerebral artery: Secondary | ICD-10-CM | POA: Diagnosis not present

## 2023-05-25 DIAGNOSIS — I6330 Cerebral infarction due to thrombosis of unspecified cerebral artery: Secondary | ICD-10-CM | POA: Diagnosis not present

## 2023-05-26 ENCOUNTER — Telehealth: Payer: Self-pay | Admitting: *Deleted

## 2023-05-26 DIAGNOSIS — I6330 Cerebral infarction due to thrombosis of unspecified cerebral artery: Secondary | ICD-10-CM | POA: Diagnosis not present

## 2023-05-26 NOTE — Patient Outreach (Signed)
Care Management   Note  05/26/2023 Name: [name]. MRN: [id number] DOB: 1961-08-18  [name] is enrolled in a Managed Medicaid plan: Yes. Outreach attempt today was successful.   RNCM spoke with DPR/Joyce. DPR reports that Mr. Stodghill is in [facility]. No further follow up required:    Estanislado Emms RN, BSN [place]  [facility] RN Care Coordinator [phone]

## 2023-05-27 DIAGNOSIS — I6330 Cerebral infarction due to thrombosis of unspecified cerebral artery: Secondary | ICD-10-CM | POA: Diagnosis not present

## 2023-05-28 DIAGNOSIS — I6330 Cerebral infarction due to thrombosis of unspecified cerebral artery: Secondary | ICD-10-CM | POA: Diagnosis not present

## 2023-05-29 DIAGNOSIS — I6330 Cerebral infarction due to thrombosis of unspecified cerebral artery: Secondary | ICD-10-CM | POA: Diagnosis not present

## 2023-05-30 DIAGNOSIS — I6330 Cerebral infarction due to thrombosis of unspecified cerebral artery: Secondary | ICD-10-CM | POA: Diagnosis not present

## 2023-05-31 ENCOUNTER — Emergency Department: Payer: Medicaid Other

## 2023-05-31 ENCOUNTER — Emergency Department
Admission: EM | Admit: 2023-05-31 | Discharge: 2023-05-31 | Disposition: A | Discharge status: Home / Self Care | Payer: Medicaid Other | Attending: Emergency Medicine | Admitting: Emergency Medicine

## 2023-05-31 ENCOUNTER — Other Ambulatory Visit: Payer: Self-pay

## 2023-05-31 DIAGNOSIS — R059 Cough, unspecified: Secondary | ICD-10-CM | POA: Diagnosis not present

## 2023-05-31 DIAGNOSIS — J439 Emphysema, unspecified: Secondary | ICD-10-CM | POA: Diagnosis not present

## 2023-05-31 DIAGNOSIS — I700 Atherosclerosis of aorta: Secondary | ICD-10-CM | POA: Diagnosis not present

## 2023-05-31 DIAGNOSIS — F17210 Nicotine dependence, cigarettes, uncomplicated: Secondary | ICD-10-CM | POA: Insufficient documentation

## 2023-05-31 DIAGNOSIS — S29012A Strain of muscle and tendon of back wall of thorax, initial encounter: Secondary | ICD-10-CM | POA: Insufficient documentation

## 2023-05-31 DIAGNOSIS — M549 Dorsalgia, unspecified: Secondary | ICD-10-CM | POA: Diagnosis not present

## 2023-05-31 DIAGNOSIS — X58XXXA Exposure to other specified factors, initial encounter: Secondary | ICD-10-CM | POA: Insufficient documentation

## 2023-05-31 DIAGNOSIS — M546 Pain in thoracic spine: Secondary | ICD-10-CM | POA: Diagnosis present

## 2023-05-31 DIAGNOSIS — I6330 Cerebral infarction due to thrombosis of unspecified cerebral artery: Secondary | ICD-10-CM | POA: Diagnosis not present

## 2023-05-31 DIAGNOSIS — Z743 Need for continuous supervision: Secondary | ICD-10-CM | POA: Diagnosis not present

## 2023-05-31 MED ORDER — KETOROLAC TROMETHAMINE 30 MG/ML IJ SOLN
30.0000 mg | Freq: Once | INTRAMUSCULAR | Status: AC
  Administered 2023-05-31: 30 mg via INTRAMUSCULAR
  Filled 2023-05-31: qty 1

## 2023-05-31 MED ORDER — METHOCARBAMOL 500 MG PO TABS
500.0000 mg | ORAL_TABLET | Freq: Once | ORAL | Status: AC
  Administered 2023-05-31: 500 mg via ORAL
  Filled 2023-05-31: qty 1

## 2023-05-31 MED ORDER — METHOCARBAMOL 500 MG PO TABS: 500.0000 mg | ORAL_TABLET | Freq: Three times a day (TID) | ORAL | 0 refills | Status: DC | PRN

## 2023-05-31 MED ORDER — LIDOCAINE 5 % EX PTCH
1.0000 | MEDICATED_PATCH | CUTANEOUS | Status: DC
  Administered 2023-05-31: 1 via TRANSDERMAL
  Filled 2023-05-31: qty 1

## 2023-05-31 MED ORDER — ACETAMINOPHEN 500 MG PO TABS
1000.0000 mg | ORAL_TABLET | Freq: Once | ORAL | Status: AC
  Administered 2023-05-31: 1000 mg via ORAL
  Filled 2023-05-31: qty 2

## 2023-05-31 MED ORDER — LIDOCAINE 5 % EX PTCH: 1.0000 | MEDICATED_PATCH | Freq: Two times a day (BID) | CUTANEOUS | 0 refills | Status: DC

## 2023-05-31 NOTE — Discharge Instructions (Addendum)
As we discussed, in your thoracic spine, the back bones at the level of your chest, do seem to have some broken bones that might be old.  If you have worsening back pain please return to the ED.  Use Tylenol for pain and fevers.  Up to 1000 mg per dose, up to 4 times per day.  Do not take more than 4000 mg of Tylenol/acetaminophen within 24 hours..  Use naproxen/Aleve for anti-inflammatory pain relief. Use up to 500mg  every 12 hours. Do not take more frequently than this. Do not use other NSAIDs (ibuprofen, Advil) while taking this medication. It is safe to take Tylenol with this.   Please use lidocaine patches at your site of pain.  Apply 1 patch at a time, leave on for 12 hours, then remove for 12 hours.  12 hours on, 12 hours off.  Do not apply more than 1 patch at a time.  Use Robaxin muscle relaxer as needed for more severe/breakthrough pain, up to 3 times per day. This medication can make some people sleepy, so do not use while driving, working or Designer, television/film set

## 2023-05-31 NOTE — ED Notes (Addendum)
Pt to ED via [facility] from [facility] c/o back pain below shoulder blades. Pain is sharp in nature and rated 9/10. Denies any injury. Denies CP, SOB  110/60 87HR 97% RA 98 temp CBG 294

## 2023-05-31 NOTE — ED Provider Notes (Signed)
[facility] Provider Note    Event Date/Time   First MD Initiated Contact with Patient 05/31/23 [phone]     (approximate)   History   Back Pain   HPI  Donald [name]. is a 62 y.o. male who presents to the ED for evaluation of Back Pain   Review of vascular surgery clinic visit from January.  History of PAD s/p 2020 bifemoral endarterectomy with stent placement. Continued cigarette smoker.   Patient presents for evaluation of about 24 hours of atraumatic back pain.  Symptoms started after he got up from sleep on Sunday, yesterday morning.  Pain throughout the day worse when he moves, no pain while supine.  No falls, trauma or injuries.  Reports a chronic cough that "might be" a little bit worse than normal but no fevers or chest pain.   Physical Exam   Triage Vital Signs: ED Triage Vitals  Encounter Vitals Group     BP 05/31/23 0127 90/62     Systolic BP Percentile --      Diastolic BP Percentile --      Pulse Rate 05/31/23 0126 91     Resp 05/31/23 0127 18     Temp 05/31/23 0126 98.3 F (36.8 C)     Temp Source 05/31/23 0126 Oral     SpO2 05/31/23 0127 95 %     Weight --      Height --      Head Circumference --      Peak Flow --      Pain Score 05/31/23 0125 9     Pain Loc --      Pain Education --      Exclude from Growth Chart --     Most recent vital signs: Vitals:   05/31/23 0127 05/31/23 0642  BP: 90/62 (!) 146/72  Pulse: 88 68  Resp: 18 18  Temp: 98.3 F (36.8 C)   SpO2: 95% 96%    General: Awake, no distress.  Thin pink puffer morphology.  Laying on his side, but able to sit upright on the edge of the bed by himself so I can examine his back. CV:  Good peripheral perfusion.  Resp:  Normal effort.  Abd:  No distention.  Soft  MSK:  No deformity noted.  Mild bilateral paraspinal thoracic tenderness without any midline spinal tenderness, skin changes or signs of trauma to the back. Neuro:  No focal deficits  appreciated. Other:     ED Results / Procedures / Treatments   Labs (all labs ordered are listed, but only abnormal results are displayed) Labs Reviewed - No data to display  EKG   RADIOLOGY CXR interpreted by me without evidence of acute cardiopulmonary pathology.  Thoracic compression deformities are noted.  Official radiology report(s): DG Chest 2 View  Result Date: 05/31/2023 CLINICAL DATA:  Cough and back pain. Evaluate for infiltrate. Smoker. EXAM: CHEST - 2 VIEW COMPARISON:  03/16/2019 FINDINGS: Heart size and mediastinal contours are normal. Aortic atherosclerotic calcifications. Lungs are hyperinflated with coarsened interstitial markings of COPD/emphysema. No pleural fluid, interstitial edema or airspace disease. Calcified granuloma noted in the right upper lobe.Upper thoracic spine compression deformities. Age indeterminate. IMPRESSION: 1. COPD/emphysema. No acute cardiopulmonary disease. 2. Upper thoracic spine compression deformities, age indeterminate. Electronically Signed   By: Signa Kell M.D.   On: 05/31/2023 06:53    PROCEDURES and INTERVENTIONS:  Procedures  Medications  lidocaine (LIDODERM) 5 % 1 patch (1 patch Transdermal Patch  Applied 05/31/23 0611)  acetaminophen (TYLENOL) tablet 1,000 mg (1,000 mg Oral Given 05/31/23 0611)  methocarbamol (ROBAXIN) tablet 500 mg (500 mg Oral Given 05/31/23 0611)  ketorolac (TORADOL) 30 MG/ML injection 30 mg (30 mg Intramuscular Given 05/31/23 0981)     IMPRESSION / MDM / ASSESSMENT AND PLAN / ED COURSE  I reviewed the triage vital signs and the nursing notes.  Differential diagnosis includes, but is not limited to, thoracic strain or spasm, herpes zoster, COPD exacerbation, pneumothorax, pneumonia  {Patient presents with symptoms of an acute illness or injury that is potentially life-threatening.  Smoker presents with about 24 hours of atraumatic thoracic back pain.  Reassuring exam without signs of neurologic deficits,  no symptoms of claudication or vascular pathology.  Suspect MSK pain such as strain or spasm.  No torticollis or cervical component.  We will obtain a two-view chest x-ray to ensure no pulmonary derangements to cause this such as a pneumothorax, infiltrate.  Will provide nonnarcotic multimodal analgesia and reassess.  Clinical Course as of 05/31/23 1914  [facility] May 31, 2023  7829 Reassessed. Feeling better [DS]  0657 Reassessed. Discussed CXR.  [DS]  0707 He again adamantly denies that he has not fallen has had no trauma.  His pain is resolved and he is asking to go home, which I think is reasonable considering his reassuring exam.  Again examined his spine he has no spine/midline tenderness so I doubt these compression fractures partially seen on 2 view CXR are acute.  We discussed these features, degree of uncertainty and close PCP follow-up as well as return precautions for the ED.  He is agreeable. [DS]    Clinical Course User Index [DS] Delton Prairie, MD     FINAL CLINICAL IMPRESSION(S) / ED DIAGNOSES   Final diagnoses:  Strain of thoracic back region     Rx / DC Orders   ED Discharge Orders          Ordered    lidocaine (LIDODERM) 5 %  Every 12 hours        05/31/23 0642    methocarbamol (ROBAXIN) 500 MG tablet  Every 8 hours PRN        05/31/23 5621             Note:  This document was prepared using Dragon voice recognition software and may include unintentional dictation errors.   Delton Prairie, MD 05/31/23 [phone]

## 2023-06-01 DIAGNOSIS — M549 Dorsalgia, unspecified: Secondary | ICD-10-CM | POA: Diagnosis not present

## 2023-06-01 DIAGNOSIS — N189 Chronic kidney disease, unspecified: Secondary | ICD-10-CM | POA: Diagnosis not present

## 2023-06-01 DIAGNOSIS — I6330 Cerebral infarction due to thrombosis of unspecified cerebral artery: Secondary | ICD-10-CM | POA: Diagnosis not present

## 2023-06-01 DIAGNOSIS — H04123 Dry eye syndrome of bilateral lacrimal glands: Secondary | ICD-10-CM | POA: Diagnosis not present

## 2023-06-01 DIAGNOSIS — E785 Hyperlipidemia, unspecified: Secondary | ICD-10-CM | POA: Diagnosis not present

## 2023-06-01 DIAGNOSIS — G8929 Other chronic pain: Secondary | ICD-10-CM | POA: Diagnosis not present

## 2023-06-01 DIAGNOSIS — K5900 Constipation, unspecified: Secondary | ICD-10-CM | POA: Diagnosis not present

## 2023-06-01 DIAGNOSIS — N400 Enlarged prostate without lower urinary tract symptoms: Secondary | ICD-10-CM | POA: Diagnosis not present

## 2023-06-01 DIAGNOSIS — G629 Polyneuropathy, unspecified: Secondary | ICD-10-CM | POA: Diagnosis not present

## 2023-06-01 DIAGNOSIS — G4700 Insomnia, unspecified: Secondary | ICD-10-CM | POA: Diagnosis not present

## 2023-06-01 DIAGNOSIS — I739 Peripheral vascular disease, unspecified: Secondary | ICD-10-CM | POA: Diagnosis not present

## 2023-06-01 DIAGNOSIS — I10 Essential (primary) hypertension: Secondary | ICD-10-CM | POA: Diagnosis not present

## 2023-06-02 DIAGNOSIS — I6330 Cerebral infarction due to thrombosis of unspecified cerebral artery: Secondary | ICD-10-CM | POA: Diagnosis not present

## 2023-06-03 DIAGNOSIS — I6330 Cerebral infarction due to thrombosis of unspecified cerebral artery: Secondary | ICD-10-CM | POA: Diagnosis not present

## 2023-06-03 DIAGNOSIS — F5105 Insomnia due to other mental disorder: Secondary | ICD-10-CM | POA: Diagnosis not present

## 2023-06-03 DIAGNOSIS — F419 Anxiety disorder, unspecified: Secondary | ICD-10-CM | POA: Diagnosis not present

## 2023-06-04 DIAGNOSIS — I6330 Cerebral infarction due to thrombosis of unspecified cerebral artery: Secondary | ICD-10-CM | POA: Diagnosis not present

## 2023-06-05 DIAGNOSIS — I6330 Cerebral infarction due to thrombosis of unspecified cerebral artery: Secondary | ICD-10-CM | POA: Diagnosis not present

## 2023-06-06 DIAGNOSIS — I6330 Cerebral infarction due to thrombosis of unspecified cerebral artery: Secondary | ICD-10-CM | POA: Diagnosis not present

## 2023-06-07 DIAGNOSIS — D518 Other vitamin B12 deficiency anemias: Secondary | ICD-10-CM | POA: Diagnosis not present

## 2023-06-07 DIAGNOSIS — E782 Mixed hyperlipidemia: Secondary | ICD-10-CM | POA: Diagnosis not present

## 2023-06-07 DIAGNOSIS — I6330 Cerebral infarction due to thrombosis of unspecified cerebral artery: Secondary | ICD-10-CM | POA: Diagnosis not present

## 2023-06-07 DIAGNOSIS — E119 Type 2 diabetes mellitus without complications: Secondary | ICD-10-CM | POA: Diagnosis not present

## 2023-06-07 DIAGNOSIS — Z79899 Other long term (current) drug therapy: Secondary | ICD-10-CM | POA: Diagnosis not present

## 2023-06-08 DIAGNOSIS — I6330 Cerebral infarction due to thrombosis of unspecified cerebral artery: Secondary | ICD-10-CM | POA: Diagnosis not present

## 2023-06-08 DIAGNOSIS — Z419 Encounter for procedure for purposes other than remedying health state, unspecified: Secondary | ICD-10-CM | POA: Diagnosis not present

## 2023-06-08 DIAGNOSIS — F17210 Nicotine dependence, cigarettes, uncomplicated: Secondary | ICD-10-CM | POA: Diagnosis not present

## 2023-06-08 DIAGNOSIS — J449 Chronic obstructive pulmonary disease, unspecified: Secondary | ICD-10-CM | POA: Diagnosis not present

## 2023-06-09 DIAGNOSIS — I6330 Cerebral infarction due to thrombosis of unspecified cerebral artery: Secondary | ICD-10-CM | POA: Diagnosis not present

## 2023-06-10 DIAGNOSIS — I6330 Cerebral infarction due to thrombosis of unspecified cerebral artery: Secondary | ICD-10-CM | POA: Diagnosis not present

## 2023-06-11 DIAGNOSIS — I6330 Cerebral infarction due to thrombosis of unspecified cerebral artery: Secondary | ICD-10-CM | POA: Diagnosis not present

## 2023-06-12 DIAGNOSIS — I6330 Cerebral infarction due to thrombosis of unspecified cerebral artery: Secondary | ICD-10-CM | POA: Diagnosis not present

## 2023-06-13 DIAGNOSIS — I6330 Cerebral infarction due to thrombosis of unspecified cerebral artery: Secondary | ICD-10-CM | POA: Diagnosis not present

## 2023-06-14 DIAGNOSIS — I6330 Cerebral infarction due to thrombosis of unspecified cerebral artery: Secondary | ICD-10-CM | POA: Diagnosis not present

## 2023-06-15 DIAGNOSIS — I6330 Cerebral infarction due to thrombosis of unspecified cerebral artery: Secondary | ICD-10-CM | POA: Diagnosis not present

## 2023-06-16 DIAGNOSIS — I6330 Cerebral infarction due to thrombosis of unspecified cerebral artery: Secondary | ICD-10-CM | POA: Diagnosis not present

## 2023-06-17 DIAGNOSIS — I10 Essential (primary) hypertension: Secondary | ICD-10-CM | POA: Diagnosis not present

## 2023-06-17 DIAGNOSIS — N1831 Chronic kidney disease, stage 3a: Secondary | ICD-10-CM | POA: Diagnosis not present

## 2023-06-17 DIAGNOSIS — N183 Chronic kidney disease, stage 3 unspecified: Secondary | ICD-10-CM | POA: Diagnosis not present

## 2023-06-17 DIAGNOSIS — I6330 Cerebral infarction due to thrombosis of unspecified cerebral artery: Secondary | ICD-10-CM | POA: Diagnosis not present

## 2023-06-18 DIAGNOSIS — I6330 Cerebral infarction due to thrombosis of unspecified cerebral artery: Secondary | ICD-10-CM | POA: Diagnosis not present

## 2023-06-19 DIAGNOSIS — I6330 Cerebral infarction due to thrombosis of unspecified cerebral artery: Secondary | ICD-10-CM | POA: Diagnosis not present

## 2023-06-20 DIAGNOSIS — I6330 Cerebral infarction due to thrombosis of unspecified cerebral artery: Secondary | ICD-10-CM | POA: Diagnosis not present

## 2023-06-21 DIAGNOSIS — D519 Vitamin B12 deficiency anemia, unspecified: Secondary | ICD-10-CM | POA: Diagnosis not present

## 2023-06-21 DIAGNOSIS — E559 Vitamin D deficiency, unspecified: Secondary | ICD-10-CM | POA: Diagnosis not present

## 2023-06-21 DIAGNOSIS — E782 Mixed hyperlipidemia: Secondary | ICD-10-CM | POA: Diagnosis not present

## 2023-06-21 DIAGNOSIS — I6330 Cerebral infarction due to thrombosis of unspecified cerebral artery: Secondary | ICD-10-CM | POA: Diagnosis not present

## 2023-06-21 DIAGNOSIS — E119 Type 2 diabetes mellitus without complications: Secondary | ICD-10-CM | POA: Diagnosis not present

## 2023-06-21 DIAGNOSIS — E038 Other specified hypothyroidism: Secondary | ICD-10-CM | POA: Diagnosis not present

## 2023-06-21 DIAGNOSIS — Z79899 Other long term (current) drug therapy: Secondary | ICD-10-CM | POA: Diagnosis not present

## 2023-06-22 DIAGNOSIS — I6330 Cerebral infarction due to thrombosis of unspecified cerebral artery: Secondary | ICD-10-CM | POA: Diagnosis not present

## 2023-06-23 DIAGNOSIS — I6330 Cerebral infarction due to thrombosis of unspecified cerebral artery: Secondary | ICD-10-CM | POA: Diagnosis not present

## 2023-06-24 DIAGNOSIS — I6330 Cerebral infarction due to thrombosis of unspecified cerebral artery: Secondary | ICD-10-CM | POA: Diagnosis not present

## 2023-06-25 DIAGNOSIS — I6330 Cerebral infarction due to thrombosis of unspecified cerebral artery: Secondary | ICD-10-CM | POA: Diagnosis not present

## 2023-06-25 DIAGNOSIS — M5489 Other dorsalgia: Secondary | ICD-10-CM | POA: Diagnosis not present

## 2023-06-25 DIAGNOSIS — I739 Peripheral vascular disease, unspecified: Secondary | ICD-10-CM | POA: Diagnosis not present

## 2023-06-25 DIAGNOSIS — S22000A Wedge compression fracture of unspecified thoracic vertebra, initial encounter for closed fracture: Secondary | ICD-10-CM | POA: Diagnosis not present

## 2023-06-25 DIAGNOSIS — G8929 Other chronic pain: Secondary | ICD-10-CM | POA: Diagnosis not present

## 2023-06-26 DIAGNOSIS — I6330 Cerebral infarction due to thrombosis of unspecified cerebral artery: Secondary | ICD-10-CM | POA: Diagnosis not present

## 2023-06-27 DIAGNOSIS — I6330 Cerebral infarction due to thrombosis of unspecified cerebral artery: Secondary | ICD-10-CM | POA: Diagnosis not present

## 2023-06-28 DIAGNOSIS — F5105 Insomnia due to other mental disorder: Secondary | ICD-10-CM | POA: Diagnosis not present

## 2023-06-28 DIAGNOSIS — F419 Anxiety disorder, unspecified: Secondary | ICD-10-CM | POA: Diagnosis not present

## 2023-06-28 DIAGNOSIS — I6330 Cerebral infarction due to thrombosis of unspecified cerebral artery: Secondary | ICD-10-CM | POA: Diagnosis not present

## 2023-06-29 DIAGNOSIS — I6330 Cerebral infarction due to thrombosis of unspecified cerebral artery: Secondary | ICD-10-CM | POA: Diagnosis not present

## 2023-06-30 DIAGNOSIS — I6330 Cerebral infarction due to thrombosis of unspecified cerebral artery: Secondary | ICD-10-CM | POA: Diagnosis not present

## 2023-07-01 DIAGNOSIS — I6330 Cerebral infarction due to thrombosis of unspecified cerebral artery: Secondary | ICD-10-CM | POA: Diagnosis not present

## 2023-07-02 DIAGNOSIS — F5105 Insomnia due to other mental disorder: Secondary | ICD-10-CM | POA: Diagnosis not present

## 2023-07-02 DIAGNOSIS — I6330 Cerebral infarction due to thrombosis of unspecified cerebral artery: Secondary | ICD-10-CM | POA: Diagnosis not present

## 2023-07-02 DIAGNOSIS — F419 Anxiety disorder, unspecified: Secondary | ICD-10-CM | POA: Diagnosis not present

## 2023-07-03 DIAGNOSIS — I6330 Cerebral infarction due to thrombosis of unspecified cerebral artery: Secondary | ICD-10-CM | POA: Diagnosis not present

## 2023-07-04 DIAGNOSIS — I6330 Cerebral infarction due to thrombosis of unspecified cerebral artery: Secondary | ICD-10-CM | POA: Diagnosis not present

## 2023-07-05 DIAGNOSIS — I6330 Cerebral infarction due to thrombosis of unspecified cerebral artery: Secondary | ICD-10-CM | POA: Diagnosis not present

## 2023-07-06 DIAGNOSIS — I10 Essential (primary) hypertension: Secondary | ICD-10-CM | POA: Diagnosis not present

## 2023-07-06 DIAGNOSIS — M549 Dorsalgia, unspecified: Secondary | ICD-10-CM | POA: Diagnosis not present

## 2023-07-06 DIAGNOSIS — E785 Hyperlipidemia, unspecified: Secondary | ICD-10-CM | POA: Diagnosis not present

## 2023-07-06 DIAGNOSIS — G629 Polyneuropathy, unspecified: Secondary | ICD-10-CM | POA: Diagnosis not present

## 2023-07-06 DIAGNOSIS — I6330 Cerebral infarction due to thrombosis of unspecified cerebral artery: Secondary | ICD-10-CM | POA: Diagnosis not present

## 2023-07-06 DIAGNOSIS — E46 Unspecified protein-calorie malnutrition: Secondary | ICD-10-CM | POA: Diagnosis not present

## 2023-07-06 DIAGNOSIS — N400 Enlarged prostate without lower urinary tract symptoms: Secondary | ICD-10-CM | POA: Diagnosis not present

## 2023-07-06 DIAGNOSIS — N189 Chronic kidney disease, unspecified: Secondary | ICD-10-CM | POA: Diagnosis not present

## 2023-07-06 DIAGNOSIS — E559 Vitamin D deficiency, unspecified: Secondary | ICD-10-CM | POA: Diagnosis not present

## 2023-07-06 DIAGNOSIS — J449 Chronic obstructive pulmonary disease, unspecified: Secondary | ICD-10-CM | POA: Diagnosis not present

## 2023-07-06 DIAGNOSIS — H04123 Dry eye syndrome of bilateral lacrimal glands: Secondary | ICD-10-CM | POA: Diagnosis not present

## 2023-07-06 DIAGNOSIS — G4700 Insomnia, unspecified: Secondary | ICD-10-CM | POA: Diagnosis not present

## 2023-07-07 DIAGNOSIS — I6330 Cerebral infarction due to thrombosis of unspecified cerebral artery: Secondary | ICD-10-CM | POA: Diagnosis not present

## 2023-07-08 DIAGNOSIS — I6330 Cerebral infarction due to thrombosis of unspecified cerebral artery: Secondary | ICD-10-CM | POA: Diagnosis not present

## 2023-07-09 DIAGNOSIS — I6330 Cerebral infarction due to thrombosis of unspecified cerebral artery: Secondary | ICD-10-CM | POA: Diagnosis not present

## 2023-07-09 DIAGNOSIS — Z419 Encounter for procedure for purposes other than remedying health state, unspecified: Secondary | ICD-10-CM | POA: Diagnosis not present

## 2023-07-10 DIAGNOSIS — I6330 Cerebral infarction due to thrombosis of unspecified cerebral artery: Secondary | ICD-10-CM | POA: Diagnosis not present

## 2023-07-11 DIAGNOSIS — I6330 Cerebral infarction due to thrombosis of unspecified cerebral artery: Secondary | ICD-10-CM | POA: Diagnosis not present

## 2023-07-12 DIAGNOSIS — I6330 Cerebral infarction due to thrombosis of unspecified cerebral artery: Secondary | ICD-10-CM | POA: Diagnosis not present

## 2023-07-13 DIAGNOSIS — I6330 Cerebral infarction due to thrombosis of unspecified cerebral artery: Secondary | ICD-10-CM | POA: Diagnosis not present

## 2023-07-14 DIAGNOSIS — I6330 Cerebral infarction due to thrombosis of unspecified cerebral artery: Secondary | ICD-10-CM | POA: Diagnosis not present

## 2023-07-15 DIAGNOSIS — I6330 Cerebral infarction due to thrombosis of unspecified cerebral artery: Secondary | ICD-10-CM | POA: Diagnosis not present

## 2023-07-16 DIAGNOSIS — I6330 Cerebral infarction due to thrombosis of unspecified cerebral artery: Secondary | ICD-10-CM | POA: Diagnosis not present

## 2023-07-17 DIAGNOSIS — I6330 Cerebral infarction due to thrombosis of unspecified cerebral artery: Secondary | ICD-10-CM | POA: Diagnosis not present

## 2023-07-18 DIAGNOSIS — I6330 Cerebral infarction due to thrombosis of unspecified cerebral artery: Secondary | ICD-10-CM | POA: Diagnosis not present

## 2023-07-19 DIAGNOSIS — I6330 Cerebral infarction due to thrombosis of unspecified cerebral artery: Secondary | ICD-10-CM | POA: Diagnosis not present

## 2023-07-20 DIAGNOSIS — I6330 Cerebral infarction due to thrombosis of unspecified cerebral artery: Secondary | ICD-10-CM | POA: Diagnosis not present

## 2023-07-21 DIAGNOSIS — I6330 Cerebral infarction due to thrombosis of unspecified cerebral artery: Secondary | ICD-10-CM | POA: Diagnosis not present

## 2023-07-22 DIAGNOSIS — I6330 Cerebral infarction due to thrombosis of unspecified cerebral artery: Secondary | ICD-10-CM | POA: Diagnosis not present

## 2023-07-23 DIAGNOSIS — I6330 Cerebral infarction due to thrombosis of unspecified cerebral artery: Secondary | ICD-10-CM | POA: Diagnosis not present

## 2023-07-24 DIAGNOSIS — I6330 Cerebral infarction due to thrombosis of unspecified cerebral artery: Secondary | ICD-10-CM | POA: Diagnosis not present

## 2023-07-25 DIAGNOSIS — I6330 Cerebral infarction due to thrombosis of unspecified cerebral artery: Secondary | ICD-10-CM | POA: Diagnosis not present

## 2023-07-26 DIAGNOSIS — I6330 Cerebral infarction due to thrombosis of unspecified cerebral artery: Secondary | ICD-10-CM | POA: Diagnosis not present

## 2023-07-27 DIAGNOSIS — I6330 Cerebral infarction due to thrombosis of unspecified cerebral artery: Secondary | ICD-10-CM | POA: Diagnosis not present

## 2023-07-28 ENCOUNTER — Other Ambulatory Visit: Payer: Self-pay

## 2023-07-28 DIAGNOSIS — Z122 Encounter for screening for malignant neoplasm of respiratory organs: Secondary | ICD-10-CM

## 2023-07-28 DIAGNOSIS — Z87891 Personal history of nicotine dependence: Secondary | ICD-10-CM

## 2023-07-28 DIAGNOSIS — I6330 Cerebral infarction due to thrombosis of unspecified cerebral artery: Secondary | ICD-10-CM | POA: Diagnosis not present

## 2023-07-28 DIAGNOSIS — F17210 Nicotine dependence, cigarettes, uncomplicated: Secondary | ICD-10-CM

## 2023-07-29 ENCOUNTER — Ambulatory Visit: Payer: Medicaid Other | Admitting: Acute Care

## 2023-07-29 DIAGNOSIS — F5105 Insomnia due to other mental disorder: Secondary | ICD-10-CM | POA: Diagnosis not present

## 2023-07-29 DIAGNOSIS — F17210 Nicotine dependence, cigarettes, uncomplicated: Secondary | ICD-10-CM

## 2023-07-29 DIAGNOSIS — I6330 Cerebral infarction due to thrombosis of unspecified cerebral artery: Secondary | ICD-10-CM | POA: Diagnosis not present

## 2023-07-29 DIAGNOSIS — F419 Anxiety disorder, unspecified: Secondary | ICD-10-CM | POA: Diagnosis not present

## 2023-07-29 NOTE — Progress Notes (Addendum)
 Provider Attestation I agree with the documentation of the Shared Decision Making visit,  smoking cessation counseling if appropriate, and verification or eligibility for lung cancer screening as documented by the RN Nurse Navigator.   Lauraine PHEBE Lites, MSN, AGACNP-BC [place] [facility] See Amion for personal pager PCCM on call pager [phone]     Virtual Visit via Telephone Note  I connected with Donald [name]. on 07/29/23 at 10:00 AM EST by telephone and verified that I am speaking with the correct person using two identifiers.  Location: Patient: Donald [name] Provider: Laneta Speaks, RN   I discussed the limitations, risks, security and privacy concerns of performing an evaluation and management service by telephone and the availability of in person appointments. I also discussed with the patient that there may be a patient responsible charge related to this service. The patient expressed understanding and agreed to proceed.   Shared Decision Making Visit Lung Cancer Screening Program [phone])   Eligibility: Age 89 y.o. Pack Years Smoking History Calculation 45 (# packs/per year x # years smoked) Recent History of coughing up blood  no Unexplained weight loss? no ( >Than 15 pounds within the last 6 months ) Prior History Lung / other cancer no (Diagnosis within the last 5 years already requiring surveillance chest CT Scans). Smoking Status Current Smoker   Visit Components: Discussion included one or more decision making aids. yes Discussion included risk/benefits of screening. yes Discussion included potential follow up diagnostic testing for abnormal scans. yes Discussion included meaning and risk of over diagnosis. yes Discussion included meaning and risk of False Positives. yes Discussion included meaning of total radiation exposure. yes  Counseling Included: Importance of adherence to annual lung cancer LDCT screening. yes Impact  of comorbidities on ability to participate in the program. yes Ability and willingness to under diagnostic treatment. yes  Smoking Cessation Counseling: Current Smokers:  Discussed importance of smoking cessation. yes Information about tobacco cessation classes and interventions provided to patient. yes Patient provided with ticket for LDCT Scan. yes Symptomatic Patient. no  Counseling(Intermediate counseling: > three minutes) 99406 Diagnosis Code: Tobacco Use Z72.0 Asymptomatic Patient yes  Counseling (Intermediate counseling: > three minutes counseling) H9563 Former Smokers:  Discussed the importance of maintaining cigarette abstinence. yes Diagnosis Code: Personal History of Nicotine  Dependence. S12.108 Information about tobacco cessation classes and interventions provided to patient. Yes Patient provided with ticket for LDCT Scan. no Written Order for Lung Cancer Screening with LDCT placed in Epic. Yes (CT Chest Lung Cancer Screening Low Dose W/O CM) [id number] Z12.2-Screening of respiratory organs Z87.891-Personal history of nicotine  dependence   Laneta Speaks, RN

## 2023-07-29 NOTE — Patient Instructions (Signed)

## 2023-07-30 DIAGNOSIS — I6330 Cerebral infarction due to thrombosis of unspecified cerebral artery: Secondary | ICD-10-CM | POA: Diagnosis not present

## 2023-07-31 DIAGNOSIS — I6330 Cerebral infarction due to thrombosis of unspecified cerebral artery: Secondary | ICD-10-CM | POA: Diagnosis not present

## 2023-08-01 DIAGNOSIS — I6330 Cerebral infarction due to thrombosis of unspecified cerebral artery: Secondary | ICD-10-CM | POA: Diagnosis not present

## 2023-08-02 DIAGNOSIS — I6330 Cerebral infarction due to thrombosis of unspecified cerebral artery: Secondary | ICD-10-CM | POA: Diagnosis not present

## 2023-08-03 DIAGNOSIS — I10 Essential (primary) hypertension: Secondary | ICD-10-CM | POA: Diagnosis not present

## 2023-08-03 DIAGNOSIS — G4700 Insomnia, unspecified: Secondary | ICD-10-CM | POA: Diagnosis not present

## 2023-08-03 DIAGNOSIS — H04123 Dry eye syndrome of bilateral lacrimal glands: Secondary | ICD-10-CM | POA: Diagnosis not present

## 2023-08-03 DIAGNOSIS — I6330 Cerebral infarction due to thrombosis of unspecified cerebral artery: Secondary | ICD-10-CM | POA: Diagnosis not present

## 2023-08-03 DIAGNOSIS — G629 Polyneuropathy, unspecified: Secondary | ICD-10-CM | POA: Diagnosis not present

## 2023-08-03 DIAGNOSIS — G8929 Other chronic pain: Secondary | ICD-10-CM | POA: Diagnosis not present

## 2023-08-03 DIAGNOSIS — E559 Vitamin D deficiency, unspecified: Secondary | ICD-10-CM | POA: Diagnosis not present

## 2023-08-03 DIAGNOSIS — E785 Hyperlipidemia, unspecified: Secondary | ICD-10-CM | POA: Diagnosis not present

## 2023-08-03 DIAGNOSIS — J449 Chronic obstructive pulmonary disease, unspecified: Secondary | ICD-10-CM | POA: Diagnosis not present

## 2023-08-03 DIAGNOSIS — M549 Dorsalgia, unspecified: Secondary | ICD-10-CM | POA: Diagnosis not present

## 2023-08-03 DIAGNOSIS — N189 Chronic kidney disease, unspecified: Secondary | ICD-10-CM | POA: Diagnosis not present

## 2023-08-03 DIAGNOSIS — K5900 Constipation, unspecified: Secondary | ICD-10-CM | POA: Diagnosis not present

## 2023-08-04 DIAGNOSIS — I6330 Cerebral infarction due to thrombosis of unspecified cerebral artery: Secondary | ICD-10-CM | POA: Diagnosis not present

## 2023-08-05 DIAGNOSIS — I6330 Cerebral infarction due to thrombosis of unspecified cerebral artery: Secondary | ICD-10-CM | POA: Diagnosis not present

## 2023-08-06 DIAGNOSIS — I6330 Cerebral infarction due to thrombosis of unspecified cerebral artery: Secondary | ICD-10-CM | POA: Diagnosis not present

## 2023-08-07 DIAGNOSIS — I6330 Cerebral infarction due to thrombosis of unspecified cerebral artery: Secondary | ICD-10-CM | POA: Diagnosis not present

## 2023-08-08 DIAGNOSIS — Z419 Encounter for procedure for purposes other than remedying health state, unspecified: Secondary | ICD-10-CM | POA: Diagnosis not present

## 2023-08-08 DIAGNOSIS — I6330 Cerebral infarction due to thrombosis of unspecified cerebral artery: Secondary | ICD-10-CM | POA: Diagnosis not present

## 2023-08-09 ENCOUNTER — Ambulatory Visit
Admission: RE | Admit: 2023-08-09 | Discharge: 2023-08-09 | Disposition: A | Discharge status: Home / Self Care | Payer: Medicaid Other | Source: Ambulatory Visit | Attending: Acute Care | Admitting: Acute Care

## 2023-08-09 DIAGNOSIS — F17210 Nicotine dependence, cigarettes, uncomplicated: Secondary | ICD-10-CM | POA: Insufficient documentation

## 2023-08-09 DIAGNOSIS — Z122 Encounter for screening for malignant neoplasm of respiratory organs: Secondary | ICD-10-CM | POA: Insufficient documentation

## 2023-08-09 DIAGNOSIS — J439 Emphysema, unspecified: Secondary | ICD-10-CM | POA: Insufficient documentation

## 2023-08-09 DIAGNOSIS — F5105 Insomnia due to other mental disorder: Secondary | ICD-10-CM | POA: Diagnosis not present

## 2023-08-09 DIAGNOSIS — F419 Anxiety disorder, unspecified: Secondary | ICD-10-CM | POA: Diagnosis not present

## 2023-08-09 DIAGNOSIS — I6330 Cerebral infarction due to thrombosis of unspecified cerebral artery: Secondary | ICD-10-CM | POA: Diagnosis not present

## 2023-08-09 DIAGNOSIS — I700 Atherosclerosis of aorta: Secondary | ICD-10-CM | POA: Insufficient documentation

## 2023-08-09 DIAGNOSIS — Z8781 Personal history of (healed) traumatic fracture: Secondary | ICD-10-CM | POA: Diagnosis not present

## 2023-08-09 DIAGNOSIS — Z87891 Personal history of nicotine dependence: Secondary | ICD-10-CM | POA: Diagnosis present

## 2023-08-09 DIAGNOSIS — I2510 Atherosclerotic heart disease of native coronary artery without angina pectoris: Secondary | ICD-10-CM | POA: Diagnosis not present

## 2023-08-10 DIAGNOSIS — I6330 Cerebral infarction due to thrombosis of unspecified cerebral artery: Secondary | ICD-10-CM | POA: Diagnosis not present

## 2023-08-11 DIAGNOSIS — I6330 Cerebral infarction due to thrombosis of unspecified cerebral artery: Secondary | ICD-10-CM | POA: Diagnosis not present

## 2023-08-12 DIAGNOSIS — I6330 Cerebral infarction due to thrombosis of unspecified cerebral artery: Secondary | ICD-10-CM | POA: Diagnosis not present

## 2023-08-13 DIAGNOSIS — I6330 Cerebral infarction due to thrombosis of unspecified cerebral artery: Secondary | ICD-10-CM | POA: Diagnosis not present

## 2023-08-14 DIAGNOSIS — I6330 Cerebral infarction due to thrombosis of unspecified cerebral artery: Secondary | ICD-10-CM | POA: Diagnosis not present

## 2023-08-15 DIAGNOSIS — I6330 Cerebral infarction due to thrombosis of unspecified cerebral artery: Secondary | ICD-10-CM | POA: Diagnosis not present

## 2023-08-16 DIAGNOSIS — I6330 Cerebral infarction due to thrombosis of unspecified cerebral artery: Secondary | ICD-10-CM | POA: Diagnosis not present

## 2023-08-17 DIAGNOSIS — I6330 Cerebral infarction due to thrombosis of unspecified cerebral artery: Secondary | ICD-10-CM | POA: Diagnosis not present

## 2023-08-18 DIAGNOSIS — I6330 Cerebral infarction due to thrombosis of unspecified cerebral artery: Secondary | ICD-10-CM | POA: Diagnosis not present

## 2023-08-19 DIAGNOSIS — I6330 Cerebral infarction due to thrombosis of unspecified cerebral artery: Secondary | ICD-10-CM | POA: Diagnosis not present

## 2023-08-20 DIAGNOSIS — I6330 Cerebral infarction due to thrombosis of unspecified cerebral artery: Secondary | ICD-10-CM | POA: Diagnosis not present

## 2023-08-21 DIAGNOSIS — I6330 Cerebral infarction due to thrombosis of unspecified cerebral artery: Secondary | ICD-10-CM | POA: Diagnosis not present

## 2023-08-22 DIAGNOSIS — I6330 Cerebral infarction due to thrombosis of unspecified cerebral artery: Secondary | ICD-10-CM | POA: Diagnosis not present

## 2023-08-23 DIAGNOSIS — I6330 Cerebral infarction due to thrombosis of unspecified cerebral artery: Secondary | ICD-10-CM | POA: Diagnosis not present

## 2023-08-24 DIAGNOSIS — I6330 Cerebral infarction due to thrombosis of unspecified cerebral artery: Secondary | ICD-10-CM | POA: Diagnosis not present

## 2023-08-25 ENCOUNTER — Telehealth: Payer: Self-pay

## 2023-08-25 DIAGNOSIS — I6330 Cerebral infarction due to thrombosis of unspecified cerebral artery: Secondary | ICD-10-CM | POA: Diagnosis not present

## 2023-08-25 NOTE — Telephone Encounter (Signed)
Spoke with patient. Reviewed CT results. Denies getting choked while eating and no known aspiration. Pt to discuss results with PCP for further needs. Results sent to PCP.

## 2023-08-26 DIAGNOSIS — I6330 Cerebral infarction due to thrombosis of unspecified cerebral artery: Secondary | ICD-10-CM | POA: Diagnosis not present

## 2023-08-27 DIAGNOSIS — I6330 Cerebral infarction due to thrombosis of unspecified cerebral artery: Secondary | ICD-10-CM | POA: Diagnosis not present

## 2023-08-28 DIAGNOSIS — I6330 Cerebral infarction due to thrombosis of unspecified cerebral artery: Secondary | ICD-10-CM | POA: Diagnosis not present

## 2023-08-29 DIAGNOSIS — I6330 Cerebral infarction due to thrombosis of unspecified cerebral artery: Secondary | ICD-10-CM | POA: Diagnosis not present

## 2023-08-30 DIAGNOSIS — F5105 Insomnia due to other mental disorder: Secondary | ICD-10-CM | POA: Diagnosis not present

## 2023-08-30 DIAGNOSIS — I6330 Cerebral infarction due to thrombosis of unspecified cerebral artery: Secondary | ICD-10-CM | POA: Diagnosis not present

## 2023-08-30 DIAGNOSIS — F419 Anxiety disorder, unspecified: Secondary | ICD-10-CM | POA: Diagnosis not present

## 2023-08-31 DIAGNOSIS — I6330 Cerebral infarction due to thrombosis of unspecified cerebral artery: Secondary | ICD-10-CM | POA: Diagnosis not present

## 2023-09-01 DIAGNOSIS — I6330 Cerebral infarction due to thrombosis of unspecified cerebral artery: Secondary | ICD-10-CM | POA: Diagnosis not present

## 2023-09-02 DIAGNOSIS — I6330 Cerebral infarction due to thrombosis of unspecified cerebral artery: Secondary | ICD-10-CM | POA: Diagnosis not present

## 2023-09-03 DIAGNOSIS — I6330 Cerebral infarction due to thrombosis of unspecified cerebral artery: Secondary | ICD-10-CM | POA: Diagnosis not present

## 2023-09-04 DIAGNOSIS — I6330 Cerebral infarction due to thrombosis of unspecified cerebral artery: Secondary | ICD-10-CM | POA: Diagnosis not present

## 2023-09-05 DIAGNOSIS — I6330 Cerebral infarction due to thrombosis of unspecified cerebral artery: Secondary | ICD-10-CM | POA: Diagnosis not present

## 2023-09-06 DIAGNOSIS — I6330 Cerebral infarction due to thrombosis of unspecified cerebral artery: Secondary | ICD-10-CM | POA: Diagnosis not present

## 2023-09-07 DIAGNOSIS — K5900 Constipation, unspecified: Secondary | ICD-10-CM | POA: Diagnosis not present

## 2023-09-07 DIAGNOSIS — G8929 Other chronic pain: Secondary | ICD-10-CM | POA: Diagnosis not present

## 2023-09-07 DIAGNOSIS — E559 Vitamin D deficiency, unspecified: Secondary | ICD-10-CM | POA: Diagnosis not present

## 2023-09-07 DIAGNOSIS — M549 Dorsalgia, unspecified: Secondary | ICD-10-CM | POA: Diagnosis not present

## 2023-09-07 DIAGNOSIS — I10 Essential (primary) hypertension: Secondary | ICD-10-CM | POA: Diagnosis not present

## 2023-09-07 DIAGNOSIS — J439 Emphysema, unspecified: Secondary | ICD-10-CM | POA: Diagnosis not present

## 2023-09-07 DIAGNOSIS — N400 Enlarged prostate without lower urinary tract symptoms: Secondary | ICD-10-CM | POA: Diagnosis not present

## 2023-09-07 DIAGNOSIS — H04123 Dry eye syndrome of bilateral lacrimal glands: Secondary | ICD-10-CM | POA: Diagnosis not present

## 2023-09-07 DIAGNOSIS — E46 Unspecified protein-calorie malnutrition: Secondary | ICD-10-CM | POA: Diagnosis not present

## 2023-09-07 DIAGNOSIS — I739 Peripheral vascular disease, unspecified: Secondary | ICD-10-CM | POA: Diagnosis not present

## 2023-09-07 DIAGNOSIS — J449 Chronic obstructive pulmonary disease, unspecified: Secondary | ICD-10-CM | POA: Diagnosis not present

## 2023-09-07 DIAGNOSIS — I6330 Cerebral infarction due to thrombosis of unspecified cerebral artery: Secondary | ICD-10-CM | POA: Diagnosis not present

## 2023-09-08 DIAGNOSIS — Z419 Encounter for procedure for purposes other than remedying health state, unspecified: Secondary | ICD-10-CM | POA: Diagnosis not present

## 2023-09-08 DIAGNOSIS — I6330 Cerebral infarction due to thrombosis of unspecified cerebral artery: Secondary | ICD-10-CM | POA: Diagnosis not present

## 2023-09-09 DIAGNOSIS — I6330 Cerebral infarction due to thrombosis of unspecified cerebral artery: Secondary | ICD-10-CM | POA: Diagnosis not present

## 2023-09-10 DIAGNOSIS — I6330 Cerebral infarction due to thrombosis of unspecified cerebral artery: Secondary | ICD-10-CM | POA: Diagnosis not present

## 2023-09-11 DIAGNOSIS — I6330 Cerebral infarction due to thrombosis of unspecified cerebral artery: Secondary | ICD-10-CM | POA: Diagnosis not present

## 2023-09-12 DIAGNOSIS — I6330 Cerebral infarction due to thrombosis of unspecified cerebral artery: Secondary | ICD-10-CM | POA: Diagnosis not present

## 2023-09-13 DIAGNOSIS — I6330 Cerebral infarction due to thrombosis of unspecified cerebral artery: Secondary | ICD-10-CM | POA: Diagnosis not present

## 2023-09-13 DIAGNOSIS — F419 Anxiety disorder, unspecified: Secondary | ICD-10-CM | POA: Diagnosis not present

## 2023-09-13 DIAGNOSIS — F5105 Insomnia due to other mental disorder: Secondary | ICD-10-CM | POA: Diagnosis not present

## 2023-09-14 DIAGNOSIS — I6330 Cerebral infarction due to thrombosis of unspecified cerebral artery: Secondary | ICD-10-CM | POA: Diagnosis not present

## 2023-09-15 DIAGNOSIS — I6330 Cerebral infarction due to thrombosis of unspecified cerebral artery: Secondary | ICD-10-CM | POA: Diagnosis not present

## 2023-09-16 DIAGNOSIS — I6330 Cerebral infarction due to thrombosis of unspecified cerebral artery: Secondary | ICD-10-CM | POA: Diagnosis not present

## 2023-09-17 DIAGNOSIS — I6330 Cerebral infarction due to thrombosis of unspecified cerebral artery: Secondary | ICD-10-CM | POA: Diagnosis not present

## 2023-09-18 DIAGNOSIS — I6330 Cerebral infarction due to thrombosis of unspecified cerebral artery: Secondary | ICD-10-CM | POA: Diagnosis not present

## 2023-09-19 DIAGNOSIS — I6330 Cerebral infarction due to thrombosis of unspecified cerebral artery: Secondary | ICD-10-CM | POA: Diagnosis not present

## 2023-09-20 DIAGNOSIS — D519 Vitamin B12 deficiency anemia, unspecified: Secondary | ICD-10-CM | POA: Diagnosis not present

## 2023-09-20 DIAGNOSIS — Z79899 Other long term (current) drug therapy: Secondary | ICD-10-CM | POA: Diagnosis not present

## 2023-09-20 DIAGNOSIS — E119 Type 2 diabetes mellitus without complications: Secondary | ICD-10-CM | POA: Diagnosis not present

## 2023-09-20 DIAGNOSIS — E782 Mixed hyperlipidemia: Secondary | ICD-10-CM | POA: Diagnosis not present

## 2023-09-20 DIAGNOSIS — E038 Other specified hypothyroidism: Secondary | ICD-10-CM | POA: Diagnosis not present

## 2023-09-20 DIAGNOSIS — I6330 Cerebral infarction due to thrombosis of unspecified cerebral artery: Secondary | ICD-10-CM | POA: Diagnosis not present

## 2023-09-21 DIAGNOSIS — I6330 Cerebral infarction due to thrombosis of unspecified cerebral artery: Secondary | ICD-10-CM | POA: Diagnosis not present

## 2023-09-22 DIAGNOSIS — N1831 Chronic kidney disease, stage 3a: Secondary | ICD-10-CM | POA: Diagnosis not present

## 2023-09-22 DIAGNOSIS — I6330 Cerebral infarction due to thrombosis of unspecified cerebral artery: Secondary | ICD-10-CM | POA: Diagnosis not present

## 2023-09-23 ENCOUNTER — Other Ambulatory Visit (INDEPENDENT_AMBULATORY_CARE_PROVIDER_SITE_OTHER): Payer: Self-pay | Admitting: Nurse Practitioner

## 2023-09-23 DIAGNOSIS — Z9889 Other specified postprocedural states: Secondary | ICD-10-CM

## 2023-09-23 DIAGNOSIS — I6330 Cerebral infarction due to thrombosis of unspecified cerebral artery: Secondary | ICD-10-CM | POA: Diagnosis not present

## 2023-09-24 DIAGNOSIS — I6330 Cerebral infarction due to thrombosis of unspecified cerebral artery: Secondary | ICD-10-CM | POA: Diagnosis not present

## 2023-09-25 DIAGNOSIS — I6330 Cerebral infarction due to thrombosis of unspecified cerebral artery: Secondary | ICD-10-CM | POA: Diagnosis not present

## 2023-09-26 DIAGNOSIS — I6330 Cerebral infarction due to thrombosis of unspecified cerebral artery: Secondary | ICD-10-CM | POA: Diagnosis not present

## 2023-09-27 DIAGNOSIS — I6330 Cerebral infarction due to thrombosis of unspecified cerebral artery: Secondary | ICD-10-CM | POA: Diagnosis not present

## 2023-09-28 ENCOUNTER — Ambulatory Visit (INDEPENDENT_AMBULATORY_CARE_PROVIDER_SITE_OTHER): Payer: Medicaid Other

## 2023-09-28 ENCOUNTER — Ambulatory Visit (INDEPENDENT_AMBULATORY_CARE_PROVIDER_SITE_OTHER): Payer: Medicaid Other | Admitting: Vascular Surgery

## 2023-09-28 DIAGNOSIS — Z9889 Other specified postprocedural states: Secondary | ICD-10-CM

## 2023-09-28 DIAGNOSIS — I6330 Cerebral infarction due to thrombosis of unspecified cerebral artery: Secondary | ICD-10-CM | POA: Diagnosis not present

## 2023-09-28 DIAGNOSIS — I739 Peripheral vascular disease, unspecified: Secondary | ICD-10-CM | POA: Diagnosis not present

## 2023-09-29 DIAGNOSIS — N1831 Chronic kidney disease, stage 3a: Secondary | ICD-10-CM | POA: Diagnosis not present

## 2023-09-29 DIAGNOSIS — I6330 Cerebral infarction due to thrombosis of unspecified cerebral artery: Secondary | ICD-10-CM | POA: Diagnosis not present

## 2023-09-29 DIAGNOSIS — I10 Essential (primary) hypertension: Secondary | ICD-10-CM | POA: Diagnosis not present

## 2023-09-29 LAB — VAS US ABI WITH/WO TBI
Left ABI: 1.18
Right ABI: 1.18

## 2023-09-30 DIAGNOSIS — I6330 Cerebral infarction due to thrombosis of unspecified cerebral artery: Secondary | ICD-10-CM | POA: Diagnosis not present

## 2023-10-01 DIAGNOSIS — I6330 Cerebral infarction due to thrombosis of unspecified cerebral artery: Secondary | ICD-10-CM | POA: Diagnosis not present

## 2023-10-02 DIAGNOSIS — I6330 Cerebral infarction due to thrombosis of unspecified cerebral artery: Secondary | ICD-10-CM | POA: Diagnosis not present

## 2023-10-03 DIAGNOSIS — I6330 Cerebral infarction due to thrombosis of unspecified cerebral artery: Secondary | ICD-10-CM | POA: Diagnosis not present

## 2023-10-04 DIAGNOSIS — I6330 Cerebral infarction due to thrombosis of unspecified cerebral artery: Secondary | ICD-10-CM | POA: Diagnosis not present

## 2023-10-05 ENCOUNTER — Ambulatory Visit (INDEPENDENT_AMBULATORY_CARE_PROVIDER_SITE_OTHER): Payer: Medicaid Other | Admitting: Vascular Surgery

## 2023-10-05 ENCOUNTER — Encounter (INDEPENDENT_AMBULATORY_CARE_PROVIDER_SITE_OTHER): Payer: Self-pay | Admitting: Vascular Surgery

## 2023-10-05 VITALS — BP 130/80 | HR 73 | Resp 18 | Ht 68.0 in | Wt 110.0 lb

## 2023-10-05 DIAGNOSIS — M549 Dorsalgia, unspecified: Secondary | ICD-10-CM | POA: Diagnosis not present

## 2023-10-05 DIAGNOSIS — N1831 Chronic kidney disease, stage 3a: Secondary | ICD-10-CM | POA: Diagnosis not present

## 2023-10-05 DIAGNOSIS — I739 Peripheral vascular disease, unspecified: Secondary | ICD-10-CM

## 2023-10-05 DIAGNOSIS — N289 Disorder of kidney and ureter, unspecified: Secondary | ICD-10-CM | POA: Diagnosis not present

## 2023-10-05 DIAGNOSIS — G4700 Insomnia, unspecified: Secondary | ICD-10-CM | POA: Diagnosis not present

## 2023-10-05 DIAGNOSIS — J449 Chronic obstructive pulmonary disease, unspecified: Secondary | ICD-10-CM | POA: Diagnosis not present

## 2023-10-05 DIAGNOSIS — I10 Essential (primary) hypertension: Secondary | ICD-10-CM | POA: Diagnosis not present

## 2023-10-05 DIAGNOSIS — G8929 Other chronic pain: Secondary | ICD-10-CM | POA: Diagnosis not present

## 2023-10-05 DIAGNOSIS — I6330 Cerebral infarction due to thrombosis of unspecified cerebral artery: Secondary | ICD-10-CM | POA: Diagnosis not present

## 2023-10-05 DIAGNOSIS — E785 Hyperlipidemia, unspecified: Secondary | ICD-10-CM | POA: Diagnosis not present

## 2023-10-05 DIAGNOSIS — N189 Chronic kidney disease, unspecified: Secondary | ICD-10-CM | POA: Diagnosis not present

## 2023-10-05 DIAGNOSIS — K5900 Constipation, unspecified: Secondary | ICD-10-CM | POA: Diagnosis not present

## 2023-10-05 DIAGNOSIS — G629 Polyneuropathy, unspecified: Secondary | ICD-10-CM | POA: Diagnosis not present

## 2023-10-05 DIAGNOSIS — H04123 Dry eye syndrome of bilateral lacrimal glands: Secondary | ICD-10-CM | POA: Diagnosis not present

## 2023-10-05 DIAGNOSIS — Z720 Tobacco use: Secondary | ICD-10-CM | POA: Diagnosis not present

## 2023-10-05 NOTE — Assessment & Plan Note (Signed)
Recent noninvasive study showed normal ABIs bilaterally at 1.18 with multiphasic waveforms and normal digital pressures.  He underwent extensive revascularization for limb salvage several years ago and has done amazingly well.  I would continue his current medical regimen which includes 2.5 mg of Eliquis twice daily.  We will continue to follow this on an annual basis.

## 2023-10-05 NOTE — Assessment & Plan Note (Signed)
blood pressure control important in reducing the progression of atherosclerotic disease. On appropriate oral medications.

## 2023-10-05 NOTE — Progress Notes (Signed)
MRN : [id number]  Donald [name]. is a 63 y.o. (July 30, 1961) male who presents with chief complaint of  Chief Complaint  Patient presents with   Follow-up    f/u in 1 year with abi  .  History of Present Illness: Patient returns today in follow up of his severe aortoiliac and femoral disease that was treated with femoral endarterectomies and extensive aortoiliac stent placement several years ago.  At the time of his revascularization, he had been profoundly ischemic and had gangrenous changes and severe neuropathic pain as well.  He has done amazingly well since that time and actually did not require major amputation.  He is now able to walk essentially symptom-free.  He denies any wounds, ulceration, or infections of the lower extremities.  He does not have disabling claudication or rest pain symptoms.  Recent noninvasive study showed normal ABIs bilaterally at 1.18 with multiphasic waveforms and normal digital pressures.  Current Outpatient Medications  Medication Sig Dispense Refill   acetaminophen (TYLENOL) 500 MG tablet Take 500 mg by mouth every 6 (six) hours as needed for moderate pain.      albuterol (VENTOLIN HFA) 108 (90 Base) MCG/ACT inhaler Inhale 2 puffs into the lungs every 6 (six) hours as needed for wheezing or shortness of breath. 8.5 g 2   amitriptyline (ELAVIL) 10 MG tablet Take 10 mg by mouth at bedtime.     apixaban (ELIQUIS) 5 MG TABS tablet Take 1 tablet (5 mg total) by mouth 2 (two) times daily. 60 tablet    atorvastatin (LIPITOR) 40 MG tablet Take 1 tablet (40 mg total) by mouth daily at 6 PM.     Carboxymeth-Glycerin-Polysorb (REFRESH DIGITAL) 0.5-1-0.5 % SOLN Apply to eye.     cholecalciferol (VITAMIN D) 25 MCG tablet Take 1 tablet (1,000 Units total) by mouth daily.     diltiazem (CARDIZEM CD) 240 MG 24 hr capsule Take 1 capsule (240 mg total) by mouth daily.     docusate sodium (COLACE) 100 MG capsule Take 100 mg by mouth daily.     ELIQUIS 2.5 MG TABS tablet  Take 2.5 mg by mouth 2 (two) times daily.     ferrous sulfate 325 (65 FE) MG EC tablet Take 325 mg by mouth daily at 6 (six) AM.     gabapentin (NEURONTIN) 400 MG capsule Take 400 mg by mouth 3 (three) times daily.     hydrALAZINE (APRESOLINE) 50 MG tablet Take 50 mg by mouth in the morning, at noon, in the evening, and at bedtime.     loperamide (IMODIUM A-D) 2 MG tablet Take 2 mg by mouth every 6 (six) hours as needed for diarrhea or loose stools.     losartan (COZAAR) 25 MG tablet Take 25 mg by mouth daily.     MAGNESIUM PO Take by mouth.     Melatonin 1 MG/4ML LIQD Take by mouth.     metoprolol succinate (TOPROL-XL) 50 MG 24 hr tablet Take 2 tablets (100 mg total) by mouth daily. 90 tablet 1   Nutritional Supplements (FEEDING SUPPLEMENT, NEPRO CARB STEADY,) LIQD Take 237 mLs by mouth 2 (two) times daily between meals. (Patient taking differently: Take 237 mLs by mouth 4 (four) times daily.)  0   oxyCODONE-acetaminophen (PERCOCET) 10-325 MG tablet Take 1 tablet by mouth every 6 (six) hours as needed.     SPIRIVA HANDIHALER 18 MCG inhalation capsule 1 capsule daily.     SYMBICORT 160-4.5 MCG/ACT inhaler Inhale into the lungs.  tamsulosin (FLOMAX) 0.4 MG CAPS capsule Take 1 capsule (0.4 mg total) by mouth daily after supper. 90 capsule 3   traZODone (DESYREL) 50 MG tablet Take 50 mg by mouth at bedtime.     lidocaine (LIDODERM) 5 % Place 1 patch onto the skin every 12 (twelve) hours. Remove & Discard patch within 12 hours or as directed by MD (Patient not taking: Reported on 10/05/2023) 10 patch 0   methocarbamol (ROBAXIN) 500 MG tablet Take 1 tablet (500 mg total) by mouth every 8 (eight) hours as needed. (Patient not taking: Reported on 10/05/2023) 30 tablet 0   vitamin C (VITAMIN C) 250 MG tablet Take 1 tablet (250 mg total) by mouth 2 (two) times daily.     No current facility-administered medications for this visit.    Past Medical History:  Diagnosis Date   Depression    Emphysema of  lung (HCC)    Essential hypertension    History of chicken pox    Hyponatremia    Insomnia    Low weight    MRSA nasal colonization    Neuropathy    bilateral lower legs   Peripheral vascular disease (HCC)    Pneumonia    Prostate disease    Seasonal allergies    Seizures (HCC)    Sensory ataxia    Sepsis (HCC)    Stroke (HCC)    Wears dentures    full upper    Past Surgical History:  Procedure Laterality Date   ABDOMINAL AORTIC ENDOVASCULAR STENT GRAFT Bilateral 03/16/2019   Procedure: AORTIC ILIAC STENT;  Surgeon: Annice Needy, MD;  Location: [facility];  Service: Vascular;  Laterality: Bilateral;   AMPUTATION TOE Right 06/16/2019   Procedure: 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;  Surgeon: Gwyneth Revels, DPM;  Location: [facility];  Service: Podiatry;  Laterality: Right;   CATARACT EXTRACTION W/PHACO Left 05/18/2018   Procedure: CATARACT EXTRACTION PHACO AND INTRAOCULAR LENS PLACEMENT (IOC) LEFT;  Surgeon: Lockie Mola, MD;  Location: [facility];  Service: Ophthalmology;  Laterality: Left;   COLONOSCOPY WITH PROPOFOL N/A 02/25/2016   Procedure: COLONOSCOPY WITH PROPOFOL;  Surgeon: Midge Minium, MD;  Location: [facility];  Service: Endoscopy;  Laterality: N/A;   ENDARTERECTOMY FEMORAL Bilateral 03/16/2019   Procedure: ENDARTERECTOMY FEMORAL;  Surgeon: Annice Needy, MD;  Location: [facility];  Service: Vascular;  Laterality: Bilateral;   EYE SURGERY  2015   Cataract   FRACTURE SURGERY     ORIF wrist   LOWER EXTREMITY ANGIOGRAPHY Left 03/13/2019   Procedure: Lower Extremity Angiography (LEFT);  Surgeon: Annice Needy, MD;  Location: [facility];  Service: Cardiovascular;  Laterality: Left;   VASCULAR SURGERY     "7 stents in legs"     Social History   Tobacco Use   Smoking status: Former    Current packs/day: 0.50    Average packs/day: 0.5 packs/day for 40.7 years (20.3 ttl pk-yrs)    Types: Cigarettes    Start date: 01/26/1983   Smokeless tobacco: Former     Quit date: 08/31/2016   Tobacco comments:    Previously quit in 09/2016 on Chantix until 09/2017  Vaping Use   Vaping status: Never Used  Substance Use Topics   Alcohol use: Not Currently    Alcohol/week: 0.0 standard drinks of alcohol    Comment: occ   Drug use: No      Family History  Problem Relation Age of Onset   Ulcers Father  bleeding   Cancer Mother        Breast  No bleeding or clotting disorders  Allergies  Allergen Reactions   Lidocaine Other (See Comments)    unknown     REVIEW OF SYSTEMS (Negative unless checked)  Constitutional: [] Weight loss  [] Fever  [] Chills Cardiac: [] Chest pain   [] Chest pressure   [] Palpitations   [] Shortness of breath when laying flat   [] Shortness of breath at rest   [x] Shortness of breath with exertion. Vascular:  [x] Pain in legs with walking   [] Pain in legs at rest   [] Pain in legs when laying flat   [] Claudication   [] Pain in feet when walking  [] Pain in feet at rest  [] Pain in feet when laying flat   [] History of DVT   [] Phlebitis   [] Swelling in legs   [] Varicose veins   [] Non-healing ulcers Pulmonary:   [] Uses home oxygen   [] Productive cough   [] Hemoptysis   [] Wheeze  [] COPD   [] Asthma Neurologic:  [] Dizziness  [] Blackouts   [] Seizures   [] History of stroke   [] History of TIA  [] Aphasia   [] Temporary blindness   [] Dysphagia   [] Weakness or numbness in arms   [x] Weakness or numbness in legs Musculoskeletal:  [] Arthritis   [] Joint swelling   [] Joint pain   [] Low back pain Hematologic:  [] Easy bruising  [] Easy bleeding   [] Hypercoagulable state   [] Anemic   Gastrointestinal:  [] Blood in stool   [] Vomiting blood  [x] Gastroesophageal reflux/heartburn   [] Abdominal pain Genitourinary:  [] Chronic kidney disease   [] Difficult urination  [] Frequent urination  [] Burning with urination   [] Hematuria Skin:  [] Rashes   [] Ulcers   [] Wounds Psychological:  [] History of anxiety   []  History of major depression.  Physical Examination  BP  130/80   Pulse 73   Resp 18   Ht 5\' 8"  (1.727 m)   Wt 110 lb (49.9 kg)   BMI 16.73 kg/m  Gen:  WD/WN, NAD Head: [place]/AT, No temporalis wasting. Ear/Nose/Throat: Hearing grossly intact, nares w/o erythema or drainage Eyes: Conjunctiva clear. Sclera non-icteric Neck: Supple.  Trachea midline Pulmonary:  Good air movement, no use of accessory muscles.  Cardiac: RRR, no JVD Vascular:  Vessel Right Left  Radial Palpable Palpable                          PT Palpable Palpable  DP Palpable Palpable   Gastrointestinal: soft, non-tender/non-distended. No guarding/reflex.  Musculoskeletal: M/S 5/5 throughout.  No deformity or atrophy. No edema. Neurologic: Sensation grossly intact in extremities.  Symmetrical.  Speech is fluent.  Psychiatric: Judgment intact, Mood & affect appropriate for pt's clinical situation. Dermatologic: No rashes or ulcers noted.  No cellulitis or open wounds.      Labs Recent Results (from the past 2160 hours)  VAS [place] ABI WITH/WO TBI     Status: None   Collection Time: 09/28/23  9:58 AM  Result Value Ref Range   Right ABI 1.18    Left ABI 1.18     Radiology VAS [place] ABI WITH/WO TBI Result Date: 09/29/2023  LOWER EXTREMITY DOPPLER STUDY Patient Name:  Donald [name]  Date of Exam:   09/28/2023 Medical Rec #: [id number]      Accession #:    [id number] Date of Birth: 1960/12/02      Patient Gender: M Patient Age:   19 years Exam Location:  [facility] Procedure:  VAS [place] ABI WITH/WO TBI Referring Phys: --------------------------------------------------------------------------------  Indications: Peripheral artery disease.  Vascular Interventions: 7/17/2020Bilateral Fem Endarts, and stents. Comparison Study: 09/2022 Performing Technologist: Salvadore Farber RVT  Examination Guidelines: A complete evaluation includes at minimum, Doppler waveform signals and systolic blood pressure reading at the level of bilateral brachial, anterior tibial, and posterior  tibial arteries, when vessel segments are accessible. Bilateral testing is considered an integral part of a complete examination. Photoelectric Plethysmograph (PPG) waveforms and toe systolic pressure readings are included as required and additional duplex testing as needed. Limited examinations for reoccurring indications may be performed as noted.  ABI Findings: +---------+------------------+-----+---------+--------+ Right    Rt Pressure (mmHg)IndexWaveform Comment  +---------+------------------+-----+---------+--------+ Brachial 119                                      +---------+------------------+-----+---------+--------+ PTA      140               1.17 triphasic         +---------+------------------+-----+---------+--------+ DP       142               1.18 triphasic         +---------+------------------+-----+---------+--------+ Great Toe125               1.04 Normal            +---------+------------------+-----+---------+--------+ +---------+------------------+-----+---------+-------+ Left     Lt Pressure (mmHg)IndexWaveform Comment +---------+------------------+-----+---------+-------+ Brachial 120                                     +---------+------------------+-----+---------+-------+ PTA      141               1.18 triphasic        +---------+------------------+-----+---------+-------+ DP       139               1.16 triphasic        +---------+------------------+-----+---------+-------+ Great Toe120               1.00 Normal           +---------+------------------+-----+---------+-------+ +-------+-----------+-----------+------------+------------+ ABI/TBIToday's ABIToday's TBIPrevious ABIPrevious TBI +-------+-----------+-----------+------------+------------+ Right  1.18       1.04       1.07        .79          +-------+-----------+-----------+------------+------------+ Left   1.18       1.00       1.03        .97           +-------+-----------+-----------+------------+------------+  Bilateral ABIs appear essentially unchanged compared to prior study on 09/2022.  Summary: Right: Resting right ankle-brachial index is within normal range. The right toe-brachial index is normal. Left: Resting left ankle-brachial index is within normal range. The left toe-brachial index is normal. *See table(s) above for measurements and observations.  Electronically signed by Festus Barren MD on 09/29/2023 at 12:55:42 PM.    Final     Assessment/Plan  Essential hypertension blood pressure control important in reducing the progression of atherosclerotic disease. On appropriate oral medications.   PAD (peripheral artery disease) (HCC) Recent noninvasive study showed normal ABIs bilaterally at 1.18 with multiphasic waveforms and normal digital pressures.  He underwent extensive revascularization for limb salvage several years ago and has  done amazingly well.  I would continue his current medical regimen which includes 2.5 mg of Eliquis twice daily.  We will continue to follow this on an annual basis.    Festus Barren, MD  10/05/2023 3:23 PM    This note was created with Dragon medical transcription system.  Any errors from dictation are purely unintentional

## 2023-10-06 DIAGNOSIS — I6330 Cerebral infarction due to thrombosis of unspecified cerebral artery: Secondary | ICD-10-CM | POA: Diagnosis not present

## 2023-10-07 DIAGNOSIS — I6330 Cerebral infarction due to thrombosis of unspecified cerebral artery: Secondary | ICD-10-CM | POA: Diagnosis not present

## 2023-10-08 DIAGNOSIS — I6330 Cerebral infarction due to thrombosis of unspecified cerebral artery: Secondary | ICD-10-CM | POA: Diagnosis not present

## 2023-10-08 DIAGNOSIS — F419 Anxiety disorder, unspecified: Secondary | ICD-10-CM | POA: Diagnosis not present

## 2023-10-08 DIAGNOSIS — F5105 Insomnia due to other mental disorder: Secondary | ICD-10-CM | POA: Diagnosis not present

## 2023-10-09 DIAGNOSIS — I6330 Cerebral infarction due to thrombosis of unspecified cerebral artery: Secondary | ICD-10-CM | POA: Diagnosis not present

## 2023-10-09 DIAGNOSIS — Z419 Encounter for procedure for purposes other than remedying health state, unspecified: Secondary | ICD-10-CM | POA: Diagnosis not present

## 2023-10-10 DIAGNOSIS — I6330 Cerebral infarction due to thrombosis of unspecified cerebral artery: Secondary | ICD-10-CM | POA: Diagnosis not present

## 2023-10-11 DIAGNOSIS — I6330 Cerebral infarction due to thrombosis of unspecified cerebral artery: Secondary | ICD-10-CM | POA: Diagnosis not present

## 2023-10-11 DIAGNOSIS — N400 Enlarged prostate without lower urinary tract symptoms: Secondary | ICD-10-CM | POA: Diagnosis not present

## 2023-10-11 DIAGNOSIS — Z79899 Other long term (current) drug therapy: Secondary | ICD-10-CM | POA: Diagnosis not present

## 2023-10-12 DIAGNOSIS — J449 Chronic obstructive pulmonary disease, unspecified: Secondary | ICD-10-CM | POA: Diagnosis not present

## 2023-10-12 DIAGNOSIS — F17210 Nicotine dependence, cigarettes, uncomplicated: Secondary | ICD-10-CM | POA: Diagnosis not present

## 2023-10-12 DIAGNOSIS — N289 Disorder of kidney and ureter, unspecified: Secondary | ICD-10-CM | POA: Diagnosis not present

## 2023-10-12 DIAGNOSIS — I6330 Cerebral infarction due to thrombosis of unspecified cerebral artery: Secondary | ICD-10-CM | POA: Diagnosis not present

## 2023-10-13 DIAGNOSIS — I6330 Cerebral infarction due to thrombosis of unspecified cerebral artery: Secondary | ICD-10-CM | POA: Diagnosis not present

## 2023-10-13 DIAGNOSIS — H04123 Dry eye syndrome of bilateral lacrimal glands: Secondary | ICD-10-CM | POA: Diagnosis not present

## 2023-10-14 DIAGNOSIS — I6330 Cerebral infarction due to thrombosis of unspecified cerebral artery: Secondary | ICD-10-CM | POA: Diagnosis not present

## 2023-10-15 DIAGNOSIS — I6330 Cerebral infarction due to thrombosis of unspecified cerebral artery: Secondary | ICD-10-CM | POA: Diagnosis not present

## 2023-10-16 DIAGNOSIS — I6330 Cerebral infarction due to thrombosis of unspecified cerebral artery: Secondary | ICD-10-CM | POA: Diagnosis not present

## 2023-10-17 DIAGNOSIS — I6330 Cerebral infarction due to thrombosis of unspecified cerebral artery: Secondary | ICD-10-CM | POA: Diagnosis not present

## 2023-10-18 DIAGNOSIS — I6330 Cerebral infarction due to thrombosis of unspecified cerebral artery: Secondary | ICD-10-CM | POA: Diagnosis not present

## 2023-10-19 ENCOUNTER — Ambulatory Visit: Payer: Medicaid Other | Admitting: Urology

## 2023-10-19 VITALS — BP 130/85 | HR 108 | Ht 68.0 in | Wt 101.5 lb

## 2023-10-19 DIAGNOSIS — N401 Enlarged prostate with lower urinary tract symptoms: Secondary | ICD-10-CM

## 2023-10-19 DIAGNOSIS — N2889 Other specified disorders of kidney and ureter: Secondary | ICD-10-CM

## 2023-10-19 DIAGNOSIS — I6330 Cerebral infarction due to thrombosis of unspecified cerebral artery: Secondary | ICD-10-CM | POA: Diagnosis not present

## 2023-10-19 DIAGNOSIS — F5105 Insomnia due to other mental disorder: Secondary | ICD-10-CM | POA: Diagnosis not present

## 2023-10-19 DIAGNOSIS — F4322 Adjustment disorder with anxiety: Secondary | ICD-10-CM | POA: Diagnosis not present

## 2023-10-19 DIAGNOSIS — F419 Anxiety disorder, unspecified: Secondary | ICD-10-CM | POA: Diagnosis not present

## 2023-10-19 DIAGNOSIS — N138 Other obstructive and reflux uropathy: Secondary | ICD-10-CM

## 2023-10-19 LAB — BLADDER SCAN AMB NON-IMAGING: Scan Result: 0

## 2023-10-19 MED ORDER — FINASTERIDE 5 MG PO TABS
5.0000 mg | ORAL_TABLET | Freq: Every day | ORAL | 11 refills | Status: DC
Start: 2023-10-19 — End: 2024-01-19

## 2023-10-19 NOTE — Patient Instructions (Signed)
Transrectal Ultrasound  A transrectal ultrasound is a procedure that uses sound waves to create images of the prostate gland and nearby tissues. For this procedure, an ultrasound probe is placed in the rectum. The probe sends sound waves through the wall of the rectum into the prostate gland. The prostate is a walnut-sized gland that is located below the bladder and in front of the rectum. The images show the size and shape of the prostate gland and nearby structures. You may need this test if you have: Trouble urinating. Trouble getting your partner pregnant (infertility). An abnormal result from a prostate screening exam. Tell a health care provider about: Any allergies you have. All medicines you are taking, including vitamins, herbs, eye drops, creams, and over-the-counter medicines. Any bleeding problems you have. Any surgeries you have had. Any medical conditions you have. Any prostate infections you have had. What are the risks? Generally, this is a safe procedure. However, problems may occur, including: Discomfort during the procedure. Blood in your urine or sperm after the procedure. This may occur if a sample of tissue is taken to look at under a microscope (biopsy) during the procedure. What happens before the procedure? Your health care provider may instruct you to use an enema 1-4 hours before the procedure. Follow instructions from your health care provider about how to do the enema. Ask your health care provider about: Changing or stopping your regular medicines. This is especially important if you are taking diabetes medicines or blood thinners. Taking medicines such as aspirin and ibuprofen. These medicines can thin your blood. Do not take these medicines unless your health care provider tells you to take them. Taking over-the-counter medicines, vitamins, herbs, and supplements. What happens during the procedure? You will be asked to lie down on your left side on an exam  table. You will bend your knees toward your chest. Gel will be put on a small probe that is about the width of a finger. The probe will be gently inserted into your rectum. You may have a feeling of fullness but should not feel pain. The probe will send signals to a computer that will create images. These will be displayed on a monitor that looks like a small television screen. The technician will slightly rotate the probe throughout the procedure. While rotating the probe, he or she will view and capture images of the prostate gland and the surrounding structures from different angles. Your health care provider may take a biopsy sample of prostate tissue during the procedure. The images captured from the ultrasound will help guide the needle that is used to remove a sample of tissue. The sample will be sent to a lab for testing. The probe will be removed. The procedure may vary among health care providers and hospitals. What can I expect after the procedure? It is up to you to get the results of your procedure. Ask your health care provider, or the department that is doing the procedure, when your results will be ready. Keep all follow-up visits. This is important. Summary A transrectal ultrasound is a procedure that uses sound waves to create images of the prostate gland and nearby tissues. The images show the size and shape of the prostate gland and nearby structures. Before the procedure, ask your health care provider about changing or stopping your regular medicines. This is especially important if you are taking diabetes medicines or blood thinners. This information is not intended to replace advice given to you by your health care  provider. Make sure you discuss any questions you have with your health care provider. Document Revised: 05/07/2021 Document Reviewed: 02/17/2021 Elsevier Patient Education  2024 [employer].    Cystoscopy Cystoscopy is a procedure that is used to help  diagnose and sometimes treat conditions that affect the lower urinary tract. The lower urinary tract includes the bladder and the urethra. The urethra is the tube that drains urine from the bladder. Cystoscopy is done using a thin, tube-shaped instrument with a light and camera at the end (cystoscope). The cystoscope may be hard or flexible, depending on the goal of the procedure. The cystoscope is inserted through the urethra, into the bladder. Cystoscopy may be recommended if you have: Urinary tract infections that keep coming back. Blood in the urine (hematuria). An inability to control when you urinate (urinary incontinence) or an overactive bladder. Unusual cells found in a urine sample. A blockage in the urethra, such as a urinary stone. Painful urination. An abnormality in the bladder found during an intravenous pyelogram (IVP) or CT scan. What are the risks? Generally, this is a safe procedure. However, problems may occur, including: Infection. Bleeding.  What happens during the procedure?  You will be given one or more of the following: A medicine to numb the area (local anesthetic). The area around the opening of your urethra will be cleaned. The cystoscope will be passed through your urethra into your bladder. Germ-free (sterile) fluid will flow through the cystoscope to fill your bladder. The fluid will stretch your bladder so that your health care provider can clearly examine your bladder walls. Your doctor will look at the urethra and bladder. The cystoscope will be removed The procedure may vary among health care providers  What can I expect after the procedure? After the procedure, it is common to have: Some soreness or pain in your urethra. Urinary symptoms. These include: Mild pain or burning when you urinate. Pain should stop within a few minutes after you urinate. This may last for up to a few days after the procedure. A small amount of blood in your urine for several  days. Feeling like you need to urinate but producing only a small amount of urine. Follow these instructions at home: General instructions Return to your normal activities as told by your health care provider.  Drink plenty of fluids after the procedure. Keep all follow-up visits as told by your health care provider. This is important. Contact a health care provider if you: Have pain that gets worse or does not get better with medicine, especially pain when you urinate lasting longer than 72 hours after the procedure. Have trouble urinating. Get help right away if you: Have blood clots in your urine. Have a fever or chills. Are unable to urinate. Summary Cystoscopy is a procedure that is used to help diagnose and sometimes treat conditions that affect the lower urinary tract. Cystoscopy is done using a thin, tube-shaped instrument with a light and camera at the end. After the procedure, it is common to have some soreness or pain in your urethra. It is normal to have blood in your urine after the procedure.  If you were prescribed an antibiotic medicine, take it as told by your health care provider.  This information is not intended to replace advice given to you by your health care provider. Make sure you discuss any questions you have with your health care provider. Document Revised: 08/16/2018 Document Reviewed: 08/16/2018 Elsevier Patient Education  2020 [employer].

## 2023-10-19 NOTE — Progress Notes (Signed)
I, Amy L Pierron, acting as a scribe for Vanna Scotland, MD.,have documented all relevant documentation on the behalf of Vanna Scotland, MD,as directed by  Vanna Scotland, MD while in the presence of Vanna Scotland, MD.  10/19/2023 10:33 AM   Donald [name] Jun 14, 1961 [id number]  Referring provider: Ellan Lambert, NP [address]  [place] [address]  Chief Complaint  Patient presents with   Establish Care   Benign Prostatic Hypertrophy    HPI: 63 year-old male with a personal history of BPH with urinary hesitancy referred by primary care for further evaluation of this.   He resides at [facility] and his mother is his legal guardian.  He's been managed on Flomax, which has recently increased to 0.4 BID.  He has multiple medical comorbidities, including cerebrovascular disease, peripheral artery disease, neuropathy, seizures, and stage 3 CKD.   He also has an incidental left renal lesion identified by nephrology. The renal ultrasound report is available in [facility]. It's read as an indeterminate subcentimeter hypoechoic lesion in the left intrapolar kidney.  He had a negative urinalysis in the end of January. He also had elevated PVR of 132. His notes also indicate that he may have a history of UTI.  His most recent PSA is  0.9.  He mentions he doesn't drink a lot of fluids. He has a good stream and feels he fully empties. He denies having accidents and nocturia. Denies malodorous urine or being dark in color. However, for the past 6 years he has had difficulty starting to void, even standing there up to 15 minutes before the stream starts. Sometimes he even sits down to urinate.   He feels the Flomax is helpful and his symptoms don't bother him much.    Results for orders placed or performed in visit on 10/19/23  Bladder Scan (Post Void Residual) in office  Result Value Ref Range   Scan Result 0 ml      PMH: Past Medical History:  Diagnosis Date   Depression    Emphysema of lung (HCC)    Essential hypertension    History of chicken pox    Hyponatremia    Insomnia    Low weight    MRSA nasal colonization    Neuropathy    bilateral lower legs   Peripheral vascular disease (HCC)    Pneumonia    Prostate disease    Seasonal allergies    Seizures (HCC)    Sensory ataxia    Sepsis (HCC)    Stroke (HCC)    Wears dentures    full upper    Surgical History: Past Surgical History:  Procedure Laterality Date   ABDOMINAL AORTIC ENDOVASCULAR STENT GRAFT Bilateral 03/16/2019   Procedure: AORTIC ILIAC STENT;  Surgeon: Annice Needy, MD;  Location: [facility];  Service: Vascular;  Laterality: Bilateral;   AMPUTATION TOE Right 06/16/2019   Procedure: 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;  Service: Ophthalmology;  Laterality: Left;   COLONOSCOPY WITH PROPOFOL N/A 02/25/2016   Procedure: COLONOSCOPY WITH PROPOFOL;  Surgeon: Midge Minium, MD;  Location: [facility];  Service: Endoscopy;  Laterality: N/A;   ENDARTERECTOMY FEMORAL Bilateral 03/16/2019   Procedure: ENDARTERECTOMY FEMORAL;  Surgeon: Annice Needy, MD;  Location: [facility];  Service: Vascular;  Laterality: Bilateral;   EYE SURGERY  2015   Cataract   FRACTURE SURGERY     ORIF wrist   LOWER EXTREMITY ANGIOGRAPHY Left 03/13/2019   Procedure: Lower Extremity Angiography (LEFT);  Surgeon: Annice Needy, MD;  Location: [facility];  Service: Cardiovascular;  Laterality: Left;   VASCULAR SURGERY     "7 stents in legs"    Home Medications:  Allergies as of 10/19/2023       Reactions   Lidocaine Other (See Comments)   unknown        Medication List         Accurate as of October 19, 2023 10:33 AM. If you have any questions, ask your nurse or doctor.          STOP taking these medications    feeding supplement (NEPRO CARB STEADY) Liqd   loperamide 2 MG tablet Commonly known as: IMODIUM A-D   methocarbamol 500 MG tablet Commonly known as: ROBAXIN   Symbicort 160-4.5 MCG/ACT inhaler Generic drug: budesonide-formoterol       TAKE these medications    acetaminophen 500 MG tablet Commonly known as: TYLENOL Take 500 mg by mouth every 6 (six) hours as needed for moderate pain.   albuterol 108 (90 Base) MCG/ACT inhaler Commonly known as: VENTOLIN HFA Inhale 2 puffs into the lungs every 6 (six) hours as needed for wheezing or shortness of breath.   amitriptyline 10 MG tablet Commonly known as: ELAVIL Take 10 mg by mouth at bedtime.   apixaban 5 MG Tabs tablet Commonly known as: ELIQUIS Take 1 tablet (5 mg total) by mouth 2 (two) times daily.   Eliquis 2.5 MG Tabs tablet Generic drug: apixaban Take 2.5 mg by mouth 2 (two) times daily.   ascorbic acid 250 MG tablet Commonly known as: VITAMIN C Take 1 tablet (250 mg total) by mouth 2 (two) times daily.   atorvastatin 10 MG tablet Commonly known as: LIPITOR Take 10 mg by mouth daily. What changed: Another medication with the same name was removed. Continue taking this medication, and follow the directions you see here.   diltiazem 240 MG 24 hr capsule Commonly known as: CARDIZEM CD Take 1 capsule (240 mg total) by mouth daily.   docusate sodium 100 MG capsule Commonly known as: COLACE Take 100 mg by mouth daily.   ferrous sulfate 325 (65 FE) MG EC tablet Take 325 mg by mouth daily at 6 (six) AM.   finasteride 5 MG tablet Commonly known as: PROSCAR Take 1 tablet (5 mg total) by mouth daily.   gabapentin 100 MG capsule Commonly known as: NEURONTIN Take 100 mg by mouth 3 (three) times daily. What changed: Another medication with the same name was  removed. Continue taking this medication, and follow the directions you see here.   hydrALAZINE 50 MG tablet Commonly known as: APRESOLINE Take 50 mg by mouth in the morning, at noon, in the evening, and at bedtime.   lidocaine 5 % Commonly known as: Lidoderm Place 1 patch onto the skin every 12 (twelve) hours. Remove & Discard patch within 12 hours or as directed by MD   losartan 25 MG tablet Commonly known as: COZAAR Take 25 mg by mouth daily.   MAGNESIUM PO Take by mouth.   Melatonin 1 MG/4ML Liqd Take by mouth.   metoprolol succinate 50 MG 24 hr tablet Commonly known as: TOPROL-XL Take 2 tablets (  100 mg total) by mouth daily.   oxyCODONE-acetaminophen 10-325 MG tablet Commonly known as: PERCOCET Take 1 tablet by mouth every 6 (six) hours as needed.   Refresh Digital 0.5-1-0.5 % Soln Generic drug: Carboxymeth-Glycerin-Polysorb Apply to eye.   Spiriva HandiHaler 18 MCG inhalation capsule Generic drug: tiotropium 1 capsule daily.   tamsulosin 0.4 MG Caps capsule Commonly known as: FLOMAX Take 1 capsule (0.4 mg total) by mouth daily after supper.   traZODone 50 MG tablet Commonly known as: DESYREL Take 50 mg by mouth at bedtime.   vitamin D3 25 MCG tablet Commonly known as: CHOLECALCIFEROL Take 1 tablet (1,000 Units total) by mouth daily.        Allergies:  Allergies  Allergen Reactions   Lidocaine Other (See Comments)    unknown    Family History: Family History  Problem Relation Age of Onset   Ulcers Father        bleeding   Cancer Mother        Breast    Social History:  reports that he has quit smoking. His smoking use included cigarettes. He started smoking about 40 years ago. He has a 20.4 pack-year smoking history. He quit smokeless tobacco use about 7 years ago. He reports that he does not currently use alcohol. He reports that he does not use drugs.   Physical Exam: BP 130/85   Pulse (!) 108   Ht 5\' 8"  (1.727 m)   Wt 101 lb 8 oz (46  kg)   BMI 15.43 kg/m   Constitutional:  Alert and oriented, No acute distress. HEENT: [place] AT, moist mucus membranes.  Trachea midline, no masses. GU: Enlarged prostate, diffusely firm, slightly decreased sphincter tone. Neurologic: Grossly intact, no focal deficits, moving all 4 extremities. Psychiatric: Normal mood and affect.   Assessment & Plan:    1. BPH  - Continue Flomax. Prescribed Finasteride. Explained it can take about 6 months for it to shrink prostate.   - Although he states his symptoms are of minimal bother, his caretaker indicated that he might not be a reliable historian and does have more issues than he revealed today.   2. Renal mass  - Subcentimeter indeterminate. Seems that his nephrologist is following this. We'll ensure that he's had follow-up imaging study down the road, if not already ordered by the nephrologist. Think given his comorbidities and very small size of lesion, a renal ultrasound again in six months is probably sufficient.  Return in about 1 month (around 11/16/2023) for cysto trus.  I have reviewed the above documentation for accuracy and completeness, and I agree with the above.   Vanna Scotland, MD   [facility] [address], [address] [place], [place] [address] [phone]

## 2023-10-20 DIAGNOSIS — I6330 Cerebral infarction due to thrombosis of unspecified cerebral artery: Secondary | ICD-10-CM | POA: Diagnosis not present

## 2023-10-21 DIAGNOSIS — I6330 Cerebral infarction due to thrombosis of unspecified cerebral artery: Secondary | ICD-10-CM | POA: Diagnosis not present

## 2023-10-22 DIAGNOSIS — I6330 Cerebral infarction due to thrombosis of unspecified cerebral artery: Secondary | ICD-10-CM | POA: Diagnosis not present

## 2023-10-23 DIAGNOSIS — I6330 Cerebral infarction due to thrombosis of unspecified cerebral artery: Secondary | ICD-10-CM | POA: Diagnosis not present

## 2023-10-24 DIAGNOSIS — I6330 Cerebral infarction due to thrombosis of unspecified cerebral artery: Secondary | ICD-10-CM | POA: Diagnosis not present

## 2023-10-25 ENCOUNTER — Observation Stay: Payer: Medicaid Other

## 2023-10-25 ENCOUNTER — Emergency Department: Payer: Medicaid Other

## 2023-10-25 ENCOUNTER — Inpatient Hospital Stay
Admission: EM | Admit: 2023-10-25 | Discharge: 2023-11-01 | DRG: 481 | Disposition: A | Discharge status: Home Health | Payer: Medicaid Other | Source: Skilled Nursing Facility | Attending: Osteopathic Medicine | Admitting: Osteopathic Medicine

## 2023-10-25 ENCOUNTER — Other Ambulatory Visit: Payer: Self-pay

## 2023-10-25 DIAGNOSIS — W19XXXA Unspecified fall, initial encounter: Secondary | ICD-10-CM | POA: Diagnosis not present

## 2023-10-25 DIAGNOSIS — Z8673 Personal history of transient ischemic attack (TIA), and cerebral infarction without residual deficits: Secondary | ICD-10-CM

## 2023-10-25 DIAGNOSIS — J449 Chronic obstructive pulmonary disease, unspecified: Secondary | ICD-10-CM | POA: Insufficient documentation

## 2023-10-25 DIAGNOSIS — M79605 Pain in left leg: Secondary | ICD-10-CM | POA: Insufficient documentation

## 2023-10-25 DIAGNOSIS — M5116 Intervertebral disc disorders with radiculopathy, lumbar region: Secondary | ICD-10-CM | POA: Diagnosis not present

## 2023-10-25 DIAGNOSIS — I739 Peripheral vascular disease, unspecified: Secondary | ICD-10-CM | POA: Diagnosis present

## 2023-10-25 DIAGNOSIS — S72102A Unspecified trochanteric fracture of left femur, initial encounter for closed fracture: Secondary | ICD-10-CM | POA: Diagnosis not present

## 2023-10-25 DIAGNOSIS — G629 Polyneuropathy, unspecified: Secondary | ICD-10-CM

## 2023-10-25 DIAGNOSIS — Z7901 Long term (current) use of anticoagulants: Secondary | ICD-10-CM

## 2023-10-25 DIAGNOSIS — E43 Unspecified severe protein-calorie malnutrition: Secondary | ICD-10-CM | POA: Diagnosis present

## 2023-10-25 DIAGNOSIS — R29898 Other symptoms and signs involving the musculoskeletal system: Principal | ICD-10-CM | POA: Diagnosis present

## 2023-10-25 DIAGNOSIS — N1831 Chronic kidney disease, stage 3a: Secondary | ICD-10-CM | POA: Diagnosis present

## 2023-10-25 DIAGNOSIS — R0602 Shortness of breath: Secondary | ICD-10-CM | POA: Diagnosis not present

## 2023-10-25 DIAGNOSIS — M48061 Spinal stenosis, lumbar region without neurogenic claudication: Secondary | ICD-10-CM | POA: Diagnosis present

## 2023-10-25 DIAGNOSIS — I700 Atherosclerosis of aorta: Secondary | ICD-10-CM | POA: Diagnosis not present

## 2023-10-25 DIAGNOSIS — Z884 Allergy status to anesthetic agent status: Secondary | ICD-10-CM

## 2023-10-25 DIAGNOSIS — I639 Cerebral infarction, unspecified: Secondary | ICD-10-CM | POA: Diagnosis present

## 2023-10-25 DIAGNOSIS — G5793 Unspecified mononeuropathy of bilateral lower limbs: Secondary | ICD-10-CM | POA: Diagnosis present

## 2023-10-25 DIAGNOSIS — M4727 Other spondylosis with radiculopathy, lumbosacral region: Secondary | ICD-10-CM | POA: Diagnosis not present

## 2023-10-25 DIAGNOSIS — J984 Other disorders of lung: Secondary | ICD-10-CM | POA: Diagnosis not present

## 2023-10-25 DIAGNOSIS — M1612 Unilateral primary osteoarthritis, left hip: Secondary | ICD-10-CM | POA: Diagnosis present

## 2023-10-25 DIAGNOSIS — I708 Atherosclerosis of other arteries: Secondary | ICD-10-CM | POA: Diagnosis present

## 2023-10-25 DIAGNOSIS — Z22322 Carrier or suspected carrier of Methicillin resistant Staphylococcus aureus: Secondary | ICD-10-CM

## 2023-10-25 DIAGNOSIS — Z1152 Encounter for screening for COVID-19: Secondary | ICD-10-CM

## 2023-10-25 DIAGNOSIS — E785 Hyperlipidemia, unspecified: Secondary | ICD-10-CM | POA: Diagnosis present

## 2023-10-25 DIAGNOSIS — F1021 Alcohol dependence, in remission: Secondary | ICD-10-CM | POA: Diagnosis present

## 2023-10-25 DIAGNOSIS — I10 Essential (primary) hypertension: Secondary | ICD-10-CM | POA: Diagnosis present

## 2023-10-25 DIAGNOSIS — I959 Hypotension, unspecified: Secondary | ICD-10-CM | POA: Diagnosis present

## 2023-10-25 DIAGNOSIS — F32A Depression, unspecified: Secondary | ICD-10-CM | POA: Diagnosis present

## 2023-10-25 DIAGNOSIS — Z9181 History of falling: Secondary | ICD-10-CM

## 2023-10-25 DIAGNOSIS — I70269 Atherosclerosis of native arteries of extremities with gangrene, unspecified extremity: Secondary | ICD-10-CM | POA: Diagnosis present

## 2023-10-25 DIAGNOSIS — F17210 Nicotine dependence, cigarettes, uncomplicated: Secondary | ICD-10-CM | POA: Diagnosis present

## 2023-10-25 DIAGNOSIS — G4700 Insomnia, unspecified: Secondary | ICD-10-CM | POA: Diagnosis present

## 2023-10-25 DIAGNOSIS — M47816 Spondylosis without myelopathy or radiculopathy, lumbar region: Secondary | ICD-10-CM | POA: Diagnosis present

## 2023-10-25 DIAGNOSIS — S8990XA Unspecified injury of unspecified lower leg, initial encounter: Secondary | ICD-10-CM | POA: Diagnosis not present

## 2023-10-25 DIAGNOSIS — I6782 Cerebral ischemia: Secondary | ICD-10-CM | POA: Diagnosis not present

## 2023-10-25 DIAGNOSIS — Y712 Prosthetic and other implants, materials and accessory cardiovascular devices associated with adverse incidents: Secondary | ICD-10-CM | POA: Diagnosis present

## 2023-10-25 DIAGNOSIS — F10931 Alcohol use, unspecified with withdrawal delirium: Secondary | ICD-10-CM | POA: Diagnosis present

## 2023-10-25 DIAGNOSIS — S72145A Nondisplaced intertrochanteric fracture of left femur, initial encounter for closed fracture: Principal | ICD-10-CM | POA: Diagnosis present

## 2023-10-25 DIAGNOSIS — R3911 Hesitancy of micturition: Secondary | ICD-10-CM | POA: Diagnosis present

## 2023-10-25 DIAGNOSIS — I129 Hypertensive chronic kidney disease with stage 1 through stage 4 chronic kidney disease, or unspecified chronic kidney disease: Secondary | ICD-10-CM | POA: Diagnosis present

## 2023-10-25 DIAGNOSIS — N179 Acute kidney failure, unspecified: Secondary | ICD-10-CM | POA: Diagnosis not present

## 2023-10-25 DIAGNOSIS — R29818 Other symptoms and signs involving the nervous system: Secondary | ICD-10-CM | POA: Diagnosis not present

## 2023-10-25 DIAGNOSIS — Y92099 Unspecified place in other non-institutional residence as the place of occurrence of the external cause: Secondary | ICD-10-CM

## 2023-10-25 DIAGNOSIS — R531 Weakness: Secondary | ICD-10-CM | POA: Diagnosis not present

## 2023-10-25 DIAGNOSIS — Z89421 Acquired absence of other right toe(s): Secondary | ICD-10-CM

## 2023-10-25 DIAGNOSIS — Z66 Do not resuscitate: Secondary | ICD-10-CM | POA: Diagnosis present

## 2023-10-25 DIAGNOSIS — F411 Generalized anxiety disorder: Secondary | ICD-10-CM | POA: Diagnosis present

## 2023-10-25 DIAGNOSIS — J439 Emphysema, unspecified: Secondary | ICD-10-CM | POA: Diagnosis present

## 2023-10-25 DIAGNOSIS — T82856A Stenosis of peripheral vascular stent, initial encounter: Secondary | ICD-10-CM | POA: Diagnosis present

## 2023-10-25 DIAGNOSIS — D62 Acute posthemorrhagic anemia: Secondary | ICD-10-CM | POA: Diagnosis not present

## 2023-10-25 DIAGNOSIS — Z79899 Other long term (current) drug therapy: Secondary | ICD-10-CM

## 2023-10-25 DIAGNOSIS — Z743 Need for continuous supervision: Secondary | ICD-10-CM | POA: Diagnosis not present

## 2023-10-25 DIAGNOSIS — M4726 Other spondylosis with radiculopathy, lumbar region: Secondary | ICD-10-CM | POA: Diagnosis not present

## 2023-10-25 DIAGNOSIS — Z791 Long term (current) use of non-steroidal anti-inflammatories (NSAID): Secondary | ICD-10-CM

## 2023-10-25 DIAGNOSIS — Z720 Tobacco use: Secondary | ICD-10-CM | POA: Insufficient documentation

## 2023-10-25 DIAGNOSIS — R54 Age-related physical debility: Secondary | ICD-10-CM | POA: Diagnosis present

## 2023-10-25 DIAGNOSIS — Z681 Body mass index (BMI) 19 or less, adult: Secondary | ICD-10-CM

## 2023-10-25 DIAGNOSIS — D123 Benign neoplasm of transverse colon: Secondary | ICD-10-CM | POA: Diagnosis present

## 2023-10-25 DIAGNOSIS — Z751 Person awaiting admission to adequate facility elsewhere: Secondary | ICD-10-CM

## 2023-10-25 DIAGNOSIS — N400 Enlarged prostate without lower urinary tract symptoms: Secondary | ICD-10-CM | POA: Diagnosis present

## 2023-10-25 LAB — CBC
HCT: 33.5 % — ABNORMAL LOW (ref 39.0–52.0)
Hemoglobin: 11.2 g/dL — ABNORMAL LOW (ref 13.0–17.0)
MCH: 32 pg (ref 26.0–34.0)
MCHC: 33.4 g/dL (ref 30.0–36.0)
MCV: 95.7 fL (ref 80.0–100.0)
Platelets: 193 10*3/uL (ref 150–400)
RBC: 3.5 MIL/uL — ABNORMAL LOW (ref 4.22–5.81)
RDW: 13.6 % (ref 11.5–15.5)
WBC: 9.5 10*3/uL (ref 4.0–10.5)
nRBC: 0 % (ref 0.0–0.2)

## 2023-10-25 LAB — BASIC METABOLIC PANEL WITH GFR
Anion gap: 7 (ref 5–15)
BUN: 19 mg/dL (ref 6–20)
CO2: 24 mmol/L (ref 22–32)
Calcium: 9.3 mg/dL (ref 8.9–10.3)
Chloride: 106 mmol/L (ref 98–111)
Creatinine, Ser: 1.51 mg/dL — ABNORMAL HIGH (ref 0.61–1.24)
GFR, Estimated: 53 mL/min — ABNORMAL LOW
Glucose, Bld: 100 mg/dL — ABNORMAL HIGH (ref 70–99)
Potassium: 3.9 mmol/L (ref 3.5–5.1)
Sodium: 137 mmol/L (ref 135–145)

## 2023-10-25 LAB — URINALYSIS, ROUTINE W REFLEX MICROSCOPIC
Bilirubin Urine: NEGATIVE
Glucose, UA: NEGATIVE mg/dL
Hgb urine dipstick: NEGATIVE
Ketones, ur: 5 mg/dL — AB
Leukocytes,Ua: NEGATIVE
Nitrite: NEGATIVE
Protein, ur: NEGATIVE mg/dL
Specific Gravity, Urine: 1.023 (ref 1.005–1.030)
pH: 5 (ref 5.0–8.0)

## 2023-10-25 LAB — RESP PANEL BY RT-PCR (RSV, FLU A&B, COVID)  RVPGX2
Influenza A by PCR: NEGATIVE
Influenza B by PCR: NEGATIVE
Resp Syncytial Virus by PCR: NEGATIVE
SARS Coronavirus 2 by RT PCR: NEGATIVE

## 2023-10-25 MED ORDER — SODIUM CHLORIDE 0.9 % IV BOLUS
1000.0000 mL | Freq: Once | INTRAVENOUS | Status: AC
  Administered 2023-10-25: 1000 mL via INTRAVENOUS

## 2023-10-25 MED ORDER — OXYCODONE-ACETAMINOPHEN 5-325 MG PO TABS
1.0000 | ORAL_TABLET | Freq: Once | ORAL | Status: AC
  Administered 2023-10-25: 1 via ORAL
  Filled 2023-10-25: qty 1

## 2023-10-25 MED ORDER — LORAZEPAM 2 MG/ML IJ SOLN
0.5000 mg | Freq: Four times a day (QID) | INTRAMUSCULAR | Status: DC | PRN
Start: 2023-10-25 — End: 2023-10-26

## 2023-10-25 MED ORDER — HEPARIN SODIUM (PORCINE) 5000 UNIT/ML IJ SOLN: 5000.0000 [IU] | Freq: Three times a day (TID) | INTRAMUSCULAR | Status: DC

## 2023-10-25 MED ORDER — NICOTINE 21 MG/24HR TD PT24: 21.0000 mg | MEDICATED_PATCH | Freq: Every day | TRANSDERMAL | Status: AC | PRN

## 2023-10-25 MED ORDER — TAMSULOSIN HCL 0.4 MG PO CAPS
0.4000 mg | ORAL_CAPSULE | Freq: Every day | ORAL | Status: DC
  Administered 2023-10-25: 0.4 mg via ORAL
  Filled 2023-10-25: qty 1

## 2023-10-25 MED ORDER — LIDOCAINE 5 % EX PTCH
1.0000 | MEDICATED_PATCH | Freq: Two times a day (BID) | CUTANEOUS | Status: DC | PRN
  Administered 2023-10-25: 1 via TRANSDERMAL
  Filled 2023-10-25: qty 1

## 2023-10-25 MED ORDER — LORAZEPAM 2 MG/ML IJ SOLN: 2.0000 mg | INTRAMUSCULAR | Status: DC | PRN

## 2023-10-25 MED ORDER — HYDRALAZINE HCL 20 MG/ML IJ SOLN: 5.0000 mg | Freq: Four times a day (QID) | INTRAMUSCULAR | Status: DC | PRN

## 2023-10-25 MED ORDER — ATORVASTATIN CALCIUM 20 MG PO TABS
10.0000 mg | ORAL_TABLET | Freq: Every day | ORAL | Status: DC
  Administered 2023-10-25 – 2023-10-26 (×2): 10 mg via ORAL
  Filled 2023-10-25 (×2): qty 1

## 2023-10-25 MED ORDER — ACETAMINOPHEN 650 MG RE SUPP: 650.0000 mg | RECTAL | Status: DC | PRN

## 2023-10-25 MED ORDER — ACETAMINOPHEN 160 MG/5ML PO SOLN: 650.0000 mg | ORAL | Status: DC | PRN

## 2023-10-25 MED ORDER — TRAZODONE HCL 50 MG PO TABS
50.0000 mg | ORAL_TABLET | Freq: Every day | ORAL | Status: DC
  Administered 2023-10-25 – 2023-10-31 (×7): 50 mg via ORAL
  Filled 2023-10-25 (×7): qty 1

## 2023-10-25 MED ORDER — VITAMIN C 500 MG PO TABS
250.0000 mg | ORAL_TABLET | Freq: Two times a day (BID) | ORAL | Status: DC
  Administered 2023-10-25 – 2023-11-01 (×13): 250 mg via ORAL
  Filled 2023-10-25 (×13): qty 1

## 2023-10-25 MED ORDER — STROKE: EARLY STAGES OF RECOVERY BOOK: Freq: Once | Status: DC

## 2023-10-25 MED ORDER — HYDRALAZINE HCL 20 MG/ML IJ SOLN
5.0000 mg | Freq: Four times a day (QID) | INTRAMUSCULAR | Status: AC | PRN
  Administered 2023-10-25: 5 mg via INTRAVENOUS
  Filled 2023-10-25: qty 1

## 2023-10-25 MED ORDER — GABAPENTIN 100 MG PO CAPS
100.0000 mg | ORAL_CAPSULE | Freq: Three times a day (TID) | ORAL | Status: DC
  Administered 2023-10-25 – 2023-10-26 (×2): 100 mg via ORAL
  Filled 2023-10-25 (×2): qty 1

## 2023-10-25 MED ORDER — FINASTERIDE 5 MG PO TABS
5.0000 mg | ORAL_TABLET | Freq: Every day | ORAL | Status: DC
  Administered 2023-10-26 – 2023-11-01 (×7): 5 mg via ORAL
  Filled 2023-10-25 (×7): qty 1

## 2023-10-25 MED ORDER — ALBUTEROL SULFATE (2.5 MG/3ML) 0.083% IN NEBU: 3.0000 mL | INHALATION_SOLUTION | Freq: Four times a day (QID) | RESPIRATORY_TRACT | Status: DC | PRN

## 2023-10-25 MED ORDER — APIXABAN 2.5 MG PO TABS
2.5000 mg | ORAL_TABLET | Freq: Two times a day (BID) | ORAL | Status: DC
  Administered 2023-10-25 – 2023-10-27 (×5): 2.5 mg via ORAL
  Filled 2023-10-25 (×6): qty 1

## 2023-10-25 MED ORDER — ACETAMINOPHEN 325 MG PO TABS
650.0000 mg | ORAL_TABLET | ORAL | Status: DC | PRN
  Administered 2023-10-25: 650 mg via ORAL
  Filled 2023-10-25: qty 2

## 2023-10-25 MED ORDER — AMITRIPTYLINE HCL 10 MG PO TABS
10.0000 mg | ORAL_TABLET | Freq: Every day | ORAL | Status: DC
Start: 2023-10-25 — End: 2023-11-01
  Administered 2023-10-25 – 2023-10-31 (×7): 10 mg via ORAL
  Filled 2023-10-25 (×8): qty 1

## 2023-10-25 MED ORDER — SENNOSIDES-DOCUSATE SODIUM 8.6-50 MG PO TABS: 1.0000 | ORAL_TABLET | Freq: Every evening | ORAL | Status: DC | PRN

## 2023-10-25 NOTE — Assessment & Plan Note (Signed)
 Stroke-like symptoms MRI brain wo contrast Fasting lipid Permissive hypertension: Hydralazine 5 mg IV every 6 hours as needed for SBP greater than 180, 1 day ordered Frequent neuro vascular checks PT, OT Fall precaution

## 2023-10-25 NOTE — ED Notes (Signed)
 MD Cox at bedside now.

## 2023-10-25 NOTE — Assessment & Plan Note (Addendum)
 Patient states that he is not ready to quit As needed nicotine patch ordered for nicotine craving

## 2023-10-25 NOTE — ED Provider Notes (Signed)
 [facility] Provider Note    Event Date/Time   First MD Initiated Contact with Patient 10/25/23 1041     (approximate)   History   Fall   HPI  Donald Feild. is a 63 y.o. male with a history of PAD and hypertension who presents with left leg weakness acute onset yesterday.  The patient states that he also feels somewhat generally weak, but stood up yesterday evening and felt like his left leg gave out on him.  Still feels weak.  He reports some pain mainly to the thigh.  He denies any numbness or tingling in the leg.  He denies any injury.  He has no back pain.  He has no numbness or weakness in the left arm or the rest of his body.  I reviewed the past medical records.  The patient's most recent outpatient encounter was on 2/11 at urology for evaluation of urinary hesitancy.  Previously his most recent ED visit was in September of last year for back pain.   Physical Exam   Triage Vital Signs: ED Triage Vitals  Encounter Vitals Group     BP 10/25/23 0606 (!) 85/60     Systolic BP Percentile --      Diastolic BP Percentile --      Pulse Rate 10/25/23 0604 91     Resp 10/25/23 0604 16     Temp 10/25/23 0604 97.7 F (36.5 C)     Temp src --      SpO2 10/25/23 0606 98 %     Weight 10/25/23 0605 107 lb (48.5 kg)     Height 10/25/23 0605 5\' 8"  (1.727 m)     Head Circumference --      Peak Flow --      Pain Score 10/25/23 0604 7     Pain Loc --      Pain Education --      Exclude from Growth Chart --     Most recent vital signs: Vitals:   10/25/23 1020 10/25/23 1416  BP: 128/84 (!) 149/71  Pulse: 85 85  Resp: 18 14  Temp: 98 F (36.7 C) 97.7 F (36.5 C)  SpO2: 98% 100%     General: Alert, tired appearing, no distress.  CV:  Good peripheral perfusion.  Resp:  Normal effort.  Abd:  No distention.  Other:  Left leg with 3/5 motor strength proximally, normal strength distally.  Normal sensation.  2+ DP pulse.  Normal cap refill.  No  midline spinal tenderness.  5/5 motor strength all other extremities.  No facial droop.  Normal speech.   ED Results / Procedures / Treatments   Labs (all labs ordered are listed, but only abnormal results are displayed) Labs Reviewed  BASIC METABOLIC PANEL - Abnormal; Notable for the following components:      Result Value   Glucose, Bld 100 (*)    Creatinine, Ser 1.51 (*)    GFR, Estimated 53 (*)    All other components within normal limits  CBC - Abnormal; Notable for the following components:   RBC 3.50 (*)    Hemoglobin 11.2 (*)    HCT 33.5 (*)    All other components within normal limits  RESP PANEL BY RT-PCR (RSV, FLU A&B, COVID)  [id number]  URINALYSIS, ROUTINE W REFLEX MICROSCOPIC     EKG  ED ECG REPORT I, Dionne Bucy, the attending physician, personally viewed and interpreted this ECG.  Date: 10/25/2023 EKG Time:  1610 Rate: 89 Rhythm: normal sinus rhythm QRS Axis: normal Intervals: normal ST/T Wave abnormalities: normal Narrative Interpretation: no evidence of acute ischemia    RADIOLOGY  MR lumbar spine:   IMPRESSION:  1. Mild degenerative changes of the lumbar spine with mild neural  foraminal narrowing at L2-L3, L3-L4 and L4-L5.  2. No high-grade spinal canal or neural foraminal stenosis at any  level.   CT head: I independently viewed and interpreted the images; there is no ICH or midline shift.  Radiology report is pending.   PROCEDURES:  Critical Care performed: No  Procedures   MEDICATIONS ORDERED IN ED: Medications  sodium chloride 0.9 % bolus 1,000 mL (0 mLs Intravenous Stopped 10/25/23 0800)     IMPRESSION / MDM / ASSESSMENT AND PLAN / ED COURSE  I reviewed the triage vital signs and the nursing notes.  63 year old male with PMH as noted above presents with acute onset of left leg weakness since yesterday.  On arrival to the ED he is somewhat hypotensive but this has now normalized.  Other vital signs are normal.  Neurologic  exam is nonfocal except for proximal left leg weakness.  The patient has a history of PAD but he has normal pulses and sensation throughout the left leg.  Differential diagnosis includes, but is not limited to, dehydration, electrolyte abnormality, polypharmacy, UTI or other infection, lumbar radiculopathy.  I have a lower suspicion for acute CVA given the isolated symptoms.  Given the reassuring exam of the left leg, there is no evidence of vascular etiology.  We will obtain CT head and MRI of the lumbar spine, as well as a urinalysis.  BMP and CBC show no concerning acute findings.  Patient's presentation is most consistent with acute complicated illness / injury requiring diagnostic workup.  ----------------------------------------- 2:38 PM on 10/25/2023 -----------------------------------------  MRI is negative for acute findings.  CT read is pending, but shows no hemorrhage or other significant abnormalities on my wet read.  The patient remains weak in the left leg and unable to ambulate without significant assistance.  He will need admission for further management.  I consulted Dr. Sedalia Muta from the hospitalist service; based on our discussion she agrees to evaluate the patient for admission.   FINAL CLINICAL IMPRESSION(S) / ED DIAGNOSES   Final diagnoses:  Left leg weakness     Rx / DC Orders   ED Discharge Orders     None        Note:  This document was prepared using Dragon voice recognition software and may include unintentional dictation errors.    Dionne Bucy, MD 10/25/23 [phone]

## 2023-10-25 NOTE — Assessment & Plan Note (Signed)
 Home apixaban 2.5 mg p.o. twice daily resumed

## 2023-10-25 NOTE — Assessment & Plan Note (Signed)
 Hydralazine 5 mg IV every 6 hours as needed for SBP > 180, 1 day ordered

## 2023-10-25 NOTE — Assessment & Plan Note (Signed)
 Ultrasound of the left lower extremity to assess for DVT

## 2023-10-25 NOTE — ED Notes (Signed)
 Patient to MRI at this time.

## 2023-10-25 NOTE — H&P (Signed)
 History and Physical   Donald [name]. ZOX:[id number] DOB: 06/06/1961 DOA: 10/25/2023  PCP: Ellan Lambert, NP  Outpatient Specialists: Dr. Wyn Quaker, vascular surgeon Patient coming from: Assisted living facility via EMS  I have personally briefly reviewed patient's old medical records in [facility] EMR.  Chief Concern: Left leg weakness  HPI: Donald [name], is a 63 year old male with history of hyperlipidemia, hypertension, neuropathy, BPH, PAD, CKD 3A, moderate COPD, cigarette/tobacco user, history of CVA, insomnia, alcohol dependence with history of delirium tremens, who presents to the emergency department for chief concerns of left leg weakness.  Vitals in the ED showed T 97.7, respiration rate of 18, heart rate of 78, blood pressure 128/84, SpO2 of 98% on room air.  Serum sodium is 137, potassium 3.9, chloride 106, bicarb 24, BUN of 19, serum creatinine of 1.51, EGFR 53, nonfasting blood glucose 100, WBC 9.5, hemoglobin 11.2, platelets of 193.  ED treatment: Sodium chloride 1 L bolus. ---------------------------------- At bedside, patient able to tell me his first and last name, age, location, current calendar year.  He states he is from [facility] assisted [facility].  He reports that at baseline he ambulates without a walker.  He endorses that he does live at assisted living due to having had 8 strokes in the past.  He reports that yesterday, he fell because he was opening the door to his room when he suddenly felt left leg weakness, his left leg giving out on him and he fell.  He denies loss of consciousness.  At bedside, he denies chest pain, shortness of breath, dysuria, hematuria, diarrhea, blood in his stool, nausea, vomiting, double vision, changes to vision, cough, fever, swelling his lower extremities.  He reports he thinks he takes all of his medication as prescribed.  Social history: He lives at assisted living facility.  He endorses continued tobacco use.  He denies  alcohol use, states that his last alcohol drink was 5 years ago.  He denies recreational drug use.  ROS: Constitutional: no weight change, no fever ENT/Mouth: no sore throat, no rhinorrhea Eyes: no eye pain, no vision changes Cardiovascular: no chest pain, no dyspnea,  no edema, no palpitations Respiratory: no cough, no sputum, no wheezing Gastrointestinal: no nausea, no vomiting, no diarrhea, no constipation Genitourinary: no urinary incontinence, no dysuria, no hematuria Musculoskeletal: no arthralgias, no myalgias Skin: no skin lesions, no pruritus, Neuro: + weakness, no loss of consciousness, no syncope Psych: no anxiety, no depression, no decrease appetite Heme/Lymph: no bruising, no bleeding  ED Course: Discussed with the EDP, patient requiring hospitalization for chief concerns of TIA/stroke workup.  Assessment/Plan  Principal Problem:   Left leg weakness Active Problems:   DTs (delirium tremens) (HCC)   Essential hypertension   PAD (peripheral artery disease) (HCC)   Peripheral neuropathy   Alcohol dependence, in remission (HCC)   Mononeuropathy of both lower extremities   Benign neoplasm of transverse colon   Insomnia   Protein-calorie malnutrition, severe   Acute CVA (cerebrovascular accident) (HCC)   Atherosclerosis of native arteries of the extremities with gangrene (HCC)   Left leg pain   Tobacco use   Assessment and Plan:  * Left leg weakness Stroke-like symptoms MRI brain wo contrast Fasting lipid Permissive hypertension: Hydralazine 5 mg IV every 6 hours as needed for SBP greater than 180, 1 day ordered Frequent neuro vascular checks PT, OT Fall precaution  Tobacco use Patient states that he is not ready to quit As needed nicotine patch ordered  for nicotine craving  Left leg pain Ultrasound of the left lower extremity to assess for DVT  Acute CVA (cerebrovascular accident) (HCC) Home apixaban 2.5 mg p.o. twice daily  resumed  Insomnia Trazodone 50 mg nightly resumed  Mononeuropathy of both lower extremities Home gabapentin 100 mg 3 times daily resumed  PAD (peripheral artery disease) (HCC) Home apixaban 2.5 mg p.o. twice daily resumed  Essential hypertension Hydralazine 5 mg IV every 6 hours as needed for SBP > 180, 1 day ordered  Chart reviewed.   DVT prophylaxis: Heparin 5000 units subcutaneous every 8 hours Code Status: DNR/DNI, confirmed patient patient at bedside Diet: heart healthy Family Communication: a phone call was offered, patient declined Disposition Plan: Pending clinical course Consults called: PT/OT Admission status: Telemetry medical, observation  Past Medical History:  Diagnosis Date   Depression    Emphysema of lung (HCC)    Essential hypertension    History of chicken pox    Hyponatremia    Insomnia    Low weight    MRSA nasal colonization    Neuropathy    bilateral lower legs   Peripheral vascular disease (HCC)    Pneumonia    Prostate disease    Seasonal allergies    Seizures (HCC)    Sensory ataxia    Sepsis (HCC)    Stroke (HCC)    Wears dentures    full upper   Past Surgical History:  Procedure Laterality Date   ABDOMINAL AORTIC ENDOVASCULAR STENT GRAFT Bilateral 03/16/2019   Procedure: AORTIC ILIAC STENT;  Surgeon: Annice Needy, MD;  Location: [facility];  Service: Vascular;  Laterality: Bilateral;   AMPUTATION TOE Right 06/16/2019   Procedure: 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;  Surgeon: Gwyneth Revels, DPM;  Location: [facility];  Service: Podiatry;  Laterality: Right;   CATARACT EXTRACTION W/PHACO Left 05/18/2018   Procedure: CATARACT EXTRACTION PHACO AND INTRAOCULAR LENS PLACEMENT (IOC) LEFT;  Surgeon: Lockie Mola, MD;  Location: [facility];  Service: Ophthalmology;  Laterality: Left;   COLONOSCOPY WITH PROPOFOL N/A 02/25/2016   Procedure: COLONOSCOPY WITH PROPOFOL;  Surgeon: Midge Minium, MD;  Location: [facility];  Service: Endoscopy;   Laterality: N/A;   ENDARTERECTOMY FEMORAL Bilateral 03/16/2019   Procedure: ENDARTERECTOMY FEMORAL;  Surgeon: Annice Needy, MD;  Location: [facility];  Service: Vascular;  Laterality: Bilateral;   EYE SURGERY  2015   Cataract   FRACTURE SURGERY     ORIF wrist   LOWER EXTREMITY ANGIOGRAPHY Left 03/13/2019   Procedure: Lower Extremity Angiography (LEFT);  Surgeon: Annice Needy, MD;  Location: [facility];  Service: Cardiovascular;  Laterality: Left;   VASCULAR SURGERY     "7 stents in legs"   Social History:  reports that he has quit smoking. His smoking use included cigarettes. He started smoking about 40 years ago. He has a 20.4 pack-year smoking history. He quit smokeless tobacco use about 7 years ago. He reports that he does not currently use alcohol. He reports that he does not use drugs.  Allergies  Allergen Reactions   Lidocaine Other (See Comments)    unknown   Family History  Problem Relation Age of Onset   Ulcers Father        bleeding   Cancer Mother        Breast   Family history: Family history reviewed and not pertinent.  Prior to Admission medications   Medication Sig Start Date End Date Taking? Authorizing Provider  naproxen (NAPROSYN) 500 MG  tablet Take 500 mg by mouth 2 (two) times daily. 09/23/23  Yes [provider]  acetaminophen (TYLENOL) 500 MG tablet Take 500 mg by mouth every 6 (six) hours as needed for moderate pain.     [provider]  albuterol (VENTOLIN HFA) 108 (90 Base) MCG/ACT inhaler Inhale 2 puffs into the lungs every 6 (six) hours as needed for wheezing or shortness of breath. 02/15/19   Karamalegos, Netta Neat, DO  amitriptyline (ELAVIL) 10 MG tablet Take 10 mg by mouth at bedtime. 09/15/22   [provider]  apixaban (ELIQUIS) 5 MG TABS tablet Take 1 tablet (5 mg total) by mouth 2 (two) times daily. 03/24/19   Gouru, Deanna Artis, MD  atorvastatin (LIPITOR) 10 MG tablet Take 10 mg by mouth daily. 10/09/23   [provider]  Carboxymeth-Glycerin-Polysorb (REFRESH DIGITAL) 0.5-1-0.5 % SOLN Apply to eye.    [provider]  cholecalciferol (VITAMIN D) 25 MCG tablet Take 1 tablet (1,000 Units total) by mouth daily. 03/24/19   Ramonita Lab, MD  diltiazem (CARDIZEM CD) 240 MG 24 hr capsule Take 1 capsule (240 mg total) by mouth daily. 03/25/19   Gouru, Deanna Artis, MD  docusate sodium (COLACE) 100 MG capsule Take 100 mg by mouth daily.    [provider]  ELIQUIS 2.5 MG TABS tablet Take 2.5 mg by mouth 2 (two) times daily. 09/15/22   [provider]  ferrous sulfate 325 (65 FE) MG EC tablet Take 325 mg by mouth daily at 6 (six) AM.    [provider]  finasteride (PROSCAR) 5 MG tablet Take 1 tablet (5 mg total) by mouth daily. 10/19/23   Vanna Scotland, MD  gabapentin (NEURONTIN) 100 MG capsule Take 100 mg by mouth 3 (three) times daily. 10/09/23   [provider]  hydrALAZINE (APRESOLINE) 50 MG tablet Take 50 mg by mouth in the morning, at noon, in the evening, and at bedtime. 09/15/22   [provider]  lidocaine (LIDODERM) 5 % Place 1 patch onto the skin every 12 (twelve) hours. Remove & Discard patch within 12 hours or as directed by MD 05/31/23 05/30/24  Delton Prairie, MD  losartan (COZAAR) 25 MG tablet Take 25 mg by mouth daily.    [provider]  MAGNESIUM PO Take by mouth.    [provider]  Melatonin 1 MG/4ML LIQD Take by mouth.    [provider]  metoprolol succinate (TOPROL-XL) 50 MG 24 hr tablet Take 2 tablets (100 mg total) by mouth daily. 03/24/19   Gouru, Deanna Artis, MD  oxyCODONE-acetaminophen (PERCOCET) 10-325 MG tablet Take 1 tablet by mouth every 6 (six) hours as needed.    [provider]  SPIRIVA HANDIHALER 18 MCG inhalation capsule 1 capsule daily.    [provider]  tamsulosin (FLOMAX) 0.4 MG CAPS capsule Take 1 capsule (0.4 mg total) by mouth daily after supper. 01/12/19   Karamalegos, Netta Neat, DO  traZODone  (DESYREL) 50 MG tablet Take 50 mg by mouth at bedtime. 09/15/22   [provider]  vitamin C (VITAMIN C) 250 MG tablet Take 1 tablet (250 mg total) by mouth 2 (two) times daily. 03/24/19   Ramonita Lab, MD   Physical Exam: Vitals:   10/25/23 0606 10/25/23 0745 10/25/23 1020 10/25/23 1416  BP: (!) 85/60 124/80 128/84 (!) 149/71  Pulse:  79 85 85  Resp:  18 18 14   Temp:   98 F (36.7 C) 97.7 F (36.5 C)  TempSrc:  Oral  SpO2: 98% 100% 98% 100%  Weight:      Height:       Constitutional: appears older than chronological age, frail, NAD, calm Eyes: PERRL, lids and conjunctivae normal ENMT: Mucous membranes are dry. Posterior pharynx clear of any exudate or lesions. Age-appropriate dentition. Hearing appropriate Neck: normal, supple, no masses, no thyromegaly Respiratory: clear to auscultation bilaterally, no wheezing, no crackles. Normal respiratory effort. No accessory muscle use.  Cardiovascular: Regular rate and rhythm, no murmurs / rubs / gallops. No extremity edema. 2+ pedal pulses. No carotid bruits.  Abdomen: no tenderness, no masses palpated, no hepatosplenomegaly. Bowel sounds positive.  Musculoskeletal: no clubbing / cyanosis. No joint deformity upper and lower extremities. Decreased ROM of the left lower extremity, no contractures, no atrophy. Normal muscle tone.+ tenderness of the left thigh  Skin: no rashes, lesions, ulcers. No induration Neurologic: Sensation intact. Strength 5/5 in all 4.  Psychiatric: Normal judgment and insight. Alert and oriented x 3. Normal mood.   EKG: independently reviewed, showing sinus rhythm with rate of 89, QTc 447  Chest x-ray on Admission: I personally reviewed and I agree with radiologist reading as below.  DG Chest Port 1 View Result Date: 10/25/2023 CLINICAL DATA:  Shortness of breath with exertion.  Weakness. EXAM: PORTABLE CHEST 1 VIEW COMPARISON:  05/31/2023 FINDINGS: Heart size is normal. Chronic aortic atherosclerosis is  noted. Chronic pulmonary scarring, most prominent in the left lower lobe. Findings appear similar to the study of September. Cannot rule out some coexistence left base pneumonia, but most if not all the findings in this area appear chronic. No pleural effusion. No acute bone finding. IMPRESSION: Chronic pulmonary scarring, most prominent in the left lower lobe. Cannot exclude some coexistent left base pneumonia, but most if not all the findings in this area appear chronic. Electronically Signed   By: Paulina Fusi M.D.   On: 10/25/2023 15:09   CT Head Wo Contrast Result Date: 10/25/2023 CLINICAL DATA:  Neuro deficit, acute, stroke suspected. Fell yesterday. Bilateral leg pain. EXAM: CT HEAD WITHOUT CONTRAST TECHNIQUE: Contiguous axial images were obtained from the base of the skull through the vertex without intravenous contrast. RADIATION DOSE REDUCTION: This exam was performed according to the departmental dose-optimization program which includes automated exposure control, adjustment of the mA and/or kV according to patient size and/or use of iterative reconstruction technique. COMPARISON:  MRI 03/23/2019 FINDINGS: Brain: Generalized brain atrophy without subjective lobar predominance. Old small vessel infarction within the right pons. No focal cerebellar insult. Old infarction in both thalami and basal ganglia. Advanced chronic small-vessel ischemic changes of the white matter. No sign of acute infarction. No mass, hemorrhage, obstructive hydrocephalus or extra-axial collection. Ventricular size is in proportion to the degree of atrophy. Vascular: There is atherosclerotic calcification of the major vessels at the base of the brain. Skull: Negative Sinuses/Orbits: Clear/normal Other: None IMPRESSION: No acute CT finding. Atrophy and chronic small-vessel ischemic changes of the white matter. Old small vessel infarctions in the right pons, both thalami and basal ganglia. Electronically Signed   By: Paulina Fusi  M.D.   On: 10/25/2023 15:08   MR LUMBAR SPINE WO CONTRAST Result Date: 10/25/2023 CLINICAL DATA:  Lumbar radiculopathy, increased fracture risk. EXAM: MRI LUMBAR SPINE WITHOUT CONTRAST TECHNIQUE: Multiplanar, multisequence MR imaging of the lumbar spine was performed. No intravenous contrast was administered. COMPARISON:  MRI of the lumbar spine November 07, 2008. FINDINGS: Segmentation:  Standard. Alignment:  Physiologic. Vertebrae: No fracture, evidence of discitis, or bone  lesion. Schmorl node in the inferior endplate of L4. Conus medullaris and cauda equina: Conus extends to the T12-L1 level. Conus and cauda equina appear normal. Paraspinal and other soft tissues: Negative. Disc levels: T12-L1: No spinal canal or neural foraminal stenosis. L1-2: Minimal disc bulge. No spinal canal or neural foraminal stenosis. L2-3: Shallow disc bulge and mild facet degenerative changes resulting in mild bilateral neural foraminal narrowing. No significant spinal canal stenosis. L3-4: Shallow disc bulge with superimposed left foraminal annular tear and mild facet degenerative changes. Mild left neural foraminal narrowing. No significant spinal canal stenosis. L4-5: Disc bulge, mild facet degenerative changes and ligamentum flavum redundancy resulting in mild bilateral neural foraminal narrowing. No significant spinal canal stenosis. L5-S1: Mild facet degenerative changes. No spinal canal or neural foraminal stenosis. IMPRESSION: 1. Mild degenerative changes of the lumbar spine with mild neural foraminal narrowing at L2-L3, L3-L4 and L4-L5. 2. No high-grade spinal canal or neural foraminal stenosis at any level. Electronically Signed   By: Baldemar Lenis M.D.   On: 10/25/2023 13:02   Labs on Admission: I have personally reviewed following labs  CBC: Recent Labs  Lab 10/25/23 0607  WBC 9.5  HGB 11.2*  HCT 33.5*  MCV 95.7  PLT 193   Basic Metabolic Panel: Recent Labs  Lab 10/25/23 0607  NA 137  K  3.9  CL 106  CO2 24  GLUCOSE 100*  BUN 19  CREATININE 1.51*  CALCIUM 9.3   GFR: Estimated Creatinine Clearance: 35.7 mL/min (A) (by C-G formula based on SCr of 1.51 mg/dL (H)).  Urine analysis:    Component Value Date/Time   COLORURINE YELLOW (A) 03/13/2019 0956   APPEARANCEUR HAZY (A) 03/13/2019 0956   APPEARANCEUR Clear 11/18/2011 1958   LABSPEC 1.013 03/13/2019 0956   LABSPEC 1.002 11/18/2011 1958   PHURINE 6.0 03/13/2019 0956   GLUCOSEU NEGATIVE 03/13/2019 0956   GLUCOSEU Negative 11/18/2011 1958   HGBUR MODERATE (A) 03/13/2019 0956   BILIRUBINUR NEGATIVE 03/13/2019 0956   BILIRUBINUR Negative 11/18/2011 1958   KETONESUR 20 (A) 03/13/2019 0956   PROTEINUR 100 (A) 03/13/2019 0956   NITRITE POSITIVE (A) 03/13/2019 0956   LEUKOCYTESUR SMALL (A) 03/13/2019 0956   LEUKOCYTESUR Negative 11/18/2011 1958   This document was prepared using Dragon Voice Recognition software and may include unintentional dictation errors.  Dr. Sedalia Muta [facility]  If 7PM-7AM, please contact overnight-coverage provider If 7AM-7PM, please contact day attending provider [email or web address]  10/25/2023, 4:34 PM

## 2023-10-25 NOTE — Assessment & Plan Note (Signed)
 Home gabapentin 100 mg 3 times daily resumed

## 2023-10-25 NOTE — Hospital Course (Addendum)
 Hospital course / significant events:   HPI:  [name], is a 63 year old male with history of hyperlipidemia, hypertension, neuropathy, BPH, PAD, CKD 3A, moderate COPD, cigarette/tobacco user, history of CVA, insomnia, alcohol dependence with history of delirium tremens, who presents to the emergency department for chief concerns of left leg weakness. He reports that at baseline he ambulates without a walker. He endorses that he does live at assisted living due to having had 8 strokes in the past. He reports that yesterday, he fell because he was opening the door to his room when he suddenly felt left leg weakness, his left leg giving out on him and he fell. He denies loss of consciousness. He states that the weakness of his LLE primarily involves his thigh, with difficulty flexing his leg at the hip, but also with significant weakness of leg extension. He denies sensory loss in either of his lower extremities.   02/17: admitted to hospitalist service w/ concern for TIA/CVA. MRI brain showed nothing acute, (+)extensive chronic small vessel eischemic changes. LLE [place] Neg CVT. MRI L-spine (+)degenerative changes w/o high grade canal/foraminal stenosis  02/18: XR hip (+)degenerative joint disease no fracture/dislocation, Neurology saw pt - "Exam reveals focal weakness of his LLE, significantly worse proximally with 2-3/5 hip flexion, 3/5 hip adduction, 4-/5 knee extension and flexion, 4/5 ADF and APF." "DDx for focal proximal > distal LLE weakness... Includes lumbosacral plexopathy, focal myopathy, iliopsoas focal lesion such as a hematoma or mass, and focal ischemic nerve injury of the femoral nerve... Per literature search, intermittent claudication is one cause of hip and thigh pain with weakness of hip flexion." Recs for MRI L thigh to assess for myopathy, which was ordered but not done this day. CT pelvis advised. CK level not collected. Hold statin. Consider vascular surgery consult/followup.   02/19: MRI L thigh/femur pending read. CT no iliopsoas abn but is showing some occlusive disease in bilateral common iliac and external iliac stents - I've asked vascular to weigh in on whether or to what extent this might be causing symptoms.  02/20: MRI (+)L femur fx, ortho to take to OR tomorrow. Vacsular saw pt, no concerns.  02/21: to OR for nail fixation L femur 02/22: Stable for discharge but his facility will not take him back - pending eval Mon 02/24 02/23: stable, mild AKI     Consultants:  Neurology  Vascular Surgery Orthopedic Surgery   Procedures/Surgeries: 10/29/23 left intramedullary intertrochanteric nail placement - Dr Signa Kell      ASSESSMENT & PLAN:   Acute nondisplaced left intertrochanteric femur fracture.  Potential for LLR weakness (see below) led to fall and still need w/u Ortho following To OR today for pin PT/OT to follow after surgery / per ortho  Left leg weakness Have reasonably ruled out CVA, lumbar disc disease / nerve compression Ddx Includes lumbosacral plexopathy, focal myopathy, iliopsoas focal lesion such as a hematoma or mass, focal ischemic nerve injury of the femoral nerve, intermittent claudication  Neurology has s/o  We were holding statin but can probably restart now  Consider outpatient Neurology referral for EMG/NCS if postop course still demonstrating significant weakness    PAD Normal ABIs last month and palpable distal pulses in left leg, do not think left leg dysfunction is 2/2 arterial disease Restart statin  holding apixaban pending ortho to OR  Vascular consult to comment on posisbility claudication may be causing weakness --> no intervention, follow routinely outpatient, suspect neuropathy more a contributor, less so any  claudication problem   History CVA MRI brain neg for acute stroke holding statin as above but can probably restart  holding apixaban pending ortho to OR    AKI Low po intake Po hydration Follow  BMP in AM  HTN Here bp mild elevation, also tachycardic in setting of holding home dilt and metop resume home metop and dilt   CKD 3a Kidney function is at baseline. Bicarb low today from normal yesterday repeat bmp, may need further eval if bicarb remains low   Insomnia home amitryptyline   BPH home proscar, flomax   COPD Quiescent incruse for home spiriva   Neuropathy home gabapentin - reduced dose per pharmacy recs given CrCl   GAD home meds  History alcoholism Reports last drink 5 years ago No intervention     underweight based on BMI: Body mass index is 16.27 kg/m.  Underweight - under 18  overweight - 25 to 29 obese - 30 or more Class 1 obesity: BMI of 30.0 to 34 Class 2 obesity: BMI of 35.0 to 39 Class 3 obesity: BMI of 40.0 to 49 Super Morbid Obesity: BMI 50-59 Super-super Morbid Obesity: BMI 60+ Significantly low or high BMI is associated with higher medical risk.  Weight management advised as adjunct to other disease management and risk reduction treatments    DVT prophylaxis: eliquis  IV fluids: no continuous IV fluids  Nutrition: cardiac diet Central lines / invasive devices: none  Code Status: DNR ACP documentation reviewed:  none on file in [facility]  TOC needs: home health PT/OT - will need reassessed following surgery  Barriers to dispo / significant pending items: for surgery today w/ Ortho

## 2023-10-25 NOTE — ED Triage Notes (Signed)
 Pt to ED [facility] from [facility], fall yesterday. C/o bilateral leg pain. Normal baseline per EMS. Denies hitting head or LOC. Reports unsure why fell, just felt tired.

## 2023-10-25 NOTE — Assessment & Plan Note (Signed)
-   Trazodone 50 mg nightly resumed

## 2023-10-26 ENCOUNTER — Inpatient Hospital Stay: Payer: Medicaid Other

## 2023-10-26 DIAGNOSIS — M48061 Spinal stenosis, lumbar region without neurogenic claudication: Secondary | ICD-10-CM | POA: Diagnosis not present

## 2023-10-26 DIAGNOSIS — I708 Atherosclerosis of other arteries: Secondary | ICD-10-CM | POA: Diagnosis not present

## 2023-10-26 DIAGNOSIS — R29898 Other symptoms and signs involving the musculoskeletal system: Secondary | ICD-10-CM | POA: Diagnosis not present

## 2023-10-26 DIAGNOSIS — M5116 Intervertebral disc disorders with radiculopathy, lumbar region: Secondary | ICD-10-CM | POA: Diagnosis not present

## 2023-10-26 DIAGNOSIS — G4700 Insomnia, unspecified: Secondary | ICD-10-CM | POA: Diagnosis present

## 2023-10-26 DIAGNOSIS — M1612 Unilateral primary osteoarthritis, left hip: Secondary | ICD-10-CM | POA: Diagnosis not present

## 2023-10-26 DIAGNOSIS — D62 Acute posthemorrhagic anemia: Secondary | ICD-10-CM | POA: Diagnosis not present

## 2023-10-26 DIAGNOSIS — M4726 Other spondylosis with radiculopathy, lumbar region: Secondary | ICD-10-CM | POA: Diagnosis not present

## 2023-10-26 DIAGNOSIS — S72142D Displaced intertrochanteric fracture of left femur, subsequent encounter for closed fracture with routine healing: Secondary | ICD-10-CM | POA: Diagnosis not present

## 2023-10-26 DIAGNOSIS — N400 Enlarged prostate without lower urinary tract symptoms: Secondary | ICD-10-CM | POA: Diagnosis present

## 2023-10-26 DIAGNOSIS — M79605 Pain in left leg: Secondary | ICD-10-CM | POA: Diagnosis not present

## 2023-10-26 DIAGNOSIS — N1831 Chronic kidney disease, stage 3a: Secondary | ICD-10-CM | POA: Diagnosis not present

## 2023-10-26 DIAGNOSIS — F32A Depression, unspecified: Secondary | ICD-10-CM | POA: Diagnosis not present

## 2023-10-26 DIAGNOSIS — I129 Hypertensive chronic kidney disease with stage 1 through stage 4 chronic kidney disease, or unspecified chronic kidney disease: Secondary | ICD-10-CM | POA: Diagnosis not present

## 2023-10-26 DIAGNOSIS — M47816 Spondylosis without myelopathy or radiculopathy, lumbar region: Secondary | ICD-10-CM | POA: Diagnosis present

## 2023-10-26 DIAGNOSIS — Z1152 Encounter for screening for COVID-19: Secondary | ICD-10-CM | POA: Diagnosis not present

## 2023-10-26 DIAGNOSIS — M25552 Pain in left hip: Secondary | ICD-10-CM | POA: Diagnosis not present

## 2023-10-26 DIAGNOSIS — Z95828 Presence of other vascular implants and grafts: Secondary | ICD-10-CM | POA: Diagnosis not present

## 2023-10-26 DIAGNOSIS — E785 Hyperlipidemia, unspecified: Secondary | ICD-10-CM | POA: Diagnosis present

## 2023-10-26 DIAGNOSIS — J449 Chronic obstructive pulmonary disease, unspecified: Secondary | ICD-10-CM | POA: Insufficient documentation

## 2023-10-26 DIAGNOSIS — I739 Peripheral vascular disease, unspecified: Secondary | ICD-10-CM | POA: Diagnosis not present

## 2023-10-26 DIAGNOSIS — Z9889 Other specified postprocedural states: Secondary | ICD-10-CM | POA: Diagnosis not present

## 2023-10-26 DIAGNOSIS — J439 Emphysema, unspecified: Secondary | ICD-10-CM | POA: Diagnosis not present

## 2023-10-26 DIAGNOSIS — G629 Polyneuropathy, unspecified: Secondary | ICD-10-CM

## 2023-10-26 DIAGNOSIS — Z7901 Long term (current) use of anticoagulants: Secondary | ICD-10-CM | POA: Diagnosis not present

## 2023-10-26 DIAGNOSIS — Z87891 Personal history of nicotine dependence: Secondary | ICD-10-CM | POA: Diagnosis not present

## 2023-10-26 DIAGNOSIS — I959 Hypotension, unspecified: Secondary | ICD-10-CM | POA: Diagnosis present

## 2023-10-26 DIAGNOSIS — F1021 Alcohol dependence, in remission: Secondary | ICD-10-CM | POA: Diagnosis not present

## 2023-10-26 DIAGNOSIS — F17210 Nicotine dependence, cigarettes, uncomplicated: Secondary | ICD-10-CM | POA: Diagnosis present

## 2023-10-26 DIAGNOSIS — S72145A Nondisplaced intertrochanteric fracture of left femur, initial encounter for closed fracture: Secondary | ICD-10-CM | POA: Diagnosis not present

## 2023-10-26 DIAGNOSIS — N179 Acute kidney failure, unspecified: Secondary | ICD-10-CM | POA: Diagnosis not present

## 2023-10-26 DIAGNOSIS — Y712 Prosthetic and other implants, materials and accessory cardiovascular devices associated with adverse incidents: Secondary | ICD-10-CM | POA: Diagnosis present

## 2023-10-26 DIAGNOSIS — R600 Localized edema: Secondary | ICD-10-CM | POA: Diagnosis not present

## 2023-10-26 DIAGNOSIS — Y92099 Unspecified place in other non-institutional residence as the place of occurrence of the external cause: Secondary | ICD-10-CM | POA: Diagnosis not present

## 2023-10-26 DIAGNOSIS — R29818 Other symptoms and signs involving the nervous system: Secondary | ICD-10-CM | POA: Diagnosis not present

## 2023-10-26 DIAGNOSIS — Z79899 Other long term (current) drug therapy: Secondary | ICD-10-CM | POA: Diagnosis not present

## 2023-10-26 DIAGNOSIS — Z751 Person awaiting admission to adequate facility elsewhere: Secondary | ICD-10-CM | POA: Diagnosis not present

## 2023-10-26 DIAGNOSIS — R531 Weakness: Secondary | ICD-10-CM | POA: Diagnosis not present

## 2023-10-26 DIAGNOSIS — T82856A Stenosis of peripheral vascular stent, initial encounter: Secondary | ICD-10-CM | POA: Diagnosis not present

## 2023-10-26 DIAGNOSIS — I6330 Cerebral infarction due to thrombosis of unspecified cerebral artery: Secondary | ICD-10-CM | POA: Diagnosis not present

## 2023-10-26 DIAGNOSIS — M25452 Effusion, left hip: Secondary | ICD-10-CM | POA: Diagnosis not present

## 2023-10-26 DIAGNOSIS — Z8673 Personal history of transient ischemic attack (TIA), and cerebral infarction without residual deficits: Secondary | ICD-10-CM | POA: Diagnosis not present

## 2023-10-26 DIAGNOSIS — Z66 Do not resuscitate: Secondary | ICD-10-CM | POA: Diagnosis not present

## 2023-10-26 DIAGNOSIS — M4727 Other spondylosis with radiculopathy, lumbosacral region: Secondary | ICD-10-CM | POA: Diagnosis not present

## 2023-10-26 DIAGNOSIS — R54 Age-related physical debility: Secondary | ICD-10-CM | POA: Diagnosis present

## 2023-10-26 DIAGNOSIS — Z681 Body mass index (BMI) 19 or less, adult: Secondary | ICD-10-CM | POA: Diagnosis not present

## 2023-10-26 DIAGNOSIS — W19XXXA Unspecified fall, initial encounter: Secondary | ICD-10-CM | POA: Diagnosis present

## 2023-10-26 LAB — CBC
HCT: 30.9 % — ABNORMAL LOW (ref 39.0–52.0)
Hemoglobin: 10.3 g/dL — ABNORMAL LOW (ref 13.0–17.0)
MCH: 32.4 pg (ref 26.0–34.0)
MCHC: 33.3 g/dL (ref 30.0–36.0)
MCV: 97.2 fL (ref 80.0–100.0)
Platelets: 155 10*3/uL (ref 150–400)
RBC: 3.18 MIL/uL — ABNORMAL LOW (ref 4.22–5.81)
RDW: 13.6 % (ref 11.5–15.5)
WBC: 7.4 10*3/uL (ref 4.0–10.5)
nRBC: 0 % (ref 0.0–0.2)

## 2023-10-26 LAB — LIPID PANEL
Cholesterol: 98 mg/dL (ref 0–200)
HDL: 39 mg/dL — ABNORMAL LOW (ref >40)
LDL Cholesterol: 38 mg/dL (ref 0–99)
Total CHOL/HDL Ratio: 2.5 {ratio}
Triglycerides: 105 mg/dL (ref <150)
VLDL: 21 mg/dL (ref 0–40)

## 2023-10-26 LAB — BASIC METABOLIC PANEL
Anion gap: 12 (ref 5–15)
BUN: 21 mg/dL — ABNORMAL HIGH (ref 6–20)
CO2: 18 mmol/L — ABNORMAL LOW (ref 22–32)
Calcium: 8.6 mg/dL — ABNORMAL LOW (ref 8.9–10.3)
Chloride: 105 mmol/L (ref 98–111)
Creatinine, Ser: 1.39 mg/dL — ABNORMAL HIGH (ref 0.61–1.24)
GFR, Estimated: 58 mL/min — ABNORMAL LOW (ref >60)
Glucose, Bld: 67 mg/dL — ABNORMAL LOW (ref 70–99)
Potassium: 3.9 mmol/L (ref 3.5–5.1)
Sodium: 135 mmol/L (ref 135–145)

## 2023-10-26 LAB — MRSA NEXT GEN BY PCR, NASAL: MRSA by PCR Next Gen: NOT DETECTED

## 2023-10-26 MED ORDER — METOPROLOL SUCCINATE ER 50 MG PO TB24
100.0000 mg | ORAL_TABLET | Freq: Every day | ORAL | Status: DC
  Administered 2023-10-26: 100 mg via ORAL
  Filled 2023-10-26: qty 2

## 2023-10-26 MED ORDER — UMECLIDINIUM BROMIDE 62.5 MCG/ACT IN AEPB
1.0000 | INHALATION_SPRAY | Freq: Every day | RESPIRATORY_TRACT | Status: DC
  Administered 2023-10-27 – 2023-10-29 (×2): 1 via RESPIRATORY_TRACT
  Filled 2023-10-26: qty 7

## 2023-10-26 MED ORDER — TAMSULOSIN HCL 0.4 MG PO CAPS
0.4000 mg | ORAL_CAPSULE | Freq: Two times a day (BID) | ORAL | Status: DC
  Administered 2023-10-26 – 2023-11-01 (×12): 0.4 mg via ORAL
  Filled 2023-10-26 (×12): qty 1

## 2023-10-26 MED ORDER — METOPROLOL SUCCINATE ER 50 MG PO TB24
50.0000 mg | ORAL_TABLET | Freq: Every day | ORAL | Status: DC
Start: 2023-10-27 — End: 2023-11-01
  Administered 2023-10-27 – 2023-11-01 (×6): 50 mg via ORAL
  Filled 2023-10-26 (×6): qty 1

## 2023-10-26 MED ORDER — GABAPENTIN 300 MG PO CAPS
400.0000 mg | ORAL_CAPSULE | Freq: Once | ORAL | Status: AC
  Administered 2023-10-26: 400 mg via ORAL
  Filled 2023-10-26: qty 1

## 2023-10-26 MED ORDER — OXYCODONE-ACETAMINOPHEN 5-325 MG PO TABS
1.0000 | ORAL_TABLET | ORAL | Status: DC | PRN
  Administered 2023-10-26 – 2023-10-27 (×3): 1 via ORAL
  Filled 2023-10-26 (×3): qty 1

## 2023-10-26 MED ORDER — DILTIAZEM HCL ER COATED BEADS 180 MG PO CP24
180.0000 mg | ORAL_CAPSULE | Freq: Every day | ORAL | Status: DC
  Administered 2023-10-26 – 2023-11-01 (×7): 180 mg via ORAL
  Filled 2023-10-26 (×7): qty 1

## 2023-10-26 MED ORDER — GABAPENTIN 300 MG PO CAPS
500.0000 mg | ORAL_CAPSULE | Freq: Three times a day (TID) | ORAL | Status: DC
Start: 2023-10-26 — End: 2023-10-29
  Administered 2023-10-26 – 2023-10-28 (×7): 500 mg via ORAL
  Filled 2023-10-26 (×7): qty 2

## 2023-10-26 MED ORDER — TIOTROPIUM BROMIDE MONOHYDRATE 18 MCG IN CAPS: 1.0000 | ORAL_CAPSULE | Freq: Every day | RESPIRATORY_TRACT | Status: DC

## 2023-10-26 NOTE — Progress Notes (Addendum)
 PROGRESS NOTE    Donald [name].  NWG:[id number] DOB: [birth year] DOA: 10/25/2023 PCP: Ellan Lambert, NP  Outpatient Specialists: nephrology, pulmonology    Brief Narrative:   From admission h and p   [name], is a 63 year old male with history of hyperlipidemia, hypertension, neuropathy, BPH, PAD, CKD 3A, moderate COPD, cigarette/tobacco user, history of CVA, insomnia, alcohol dependence with history of delirium tremens, who presents to the emergency department for chief concerns of left leg weakness.   He states he is from [facility].   He reports that at baseline he ambulates without a walker.  He endorses that he does live at assisted living due to having had 8 strokes in the past.  He reports that yesterday, he fell because he was opening the door to his room when he suddenly felt left leg weakness, his left leg giving out on him and he fell.  He denies loss of consciousness.   At bedside, he denies chest pain, shortness of breath, dysuria, hematuria, diarrhea, blood in his stool, nausea, vomiting, double vision, changes to vision, cough, fever, swelling his lower extremities.   He reports he thinks he takes all of his medication as prescribed.   Assessment & Plan:   Principal Problem:   Left leg weakness Active Problems:   DTs (delirium tremens) (HCC)   Essential hypertension   Benign prostatic hyperplasia   PAD (peripheral artery disease) (HCC)   Peripheral neuropathy   Alcohol dependence, in remission (HCC)   Mononeuropathy of both lower extremities   Benign neoplasm of transverse colon   Insomnia   Protein-calorie malnutrition, severe   Acute CVA (cerebrovascular accident) (HCC)   Atherosclerosis of native arteries of the extremities with gangrene (HCC)   Left leg pain   Tobacco use   CKD stage 3a, GFR 45-59 ml/min (HCC)   COPD (chronic obstructive pulmonary disease) (HCC)  # Left leg weakness Patient reports 2 days of symptoms.  At baseline ambulates with a cane or walker but he is confident this is an acute change. Mri of brain and lumbar spine unrevealing. Patient also reports some pain in the leg, possible this is his neuropathy? - PT/OT consults pending - patient reported left hip pain to PT today, so will check x-ray  - will consult neurology for further evaluation  # History CVA MRI neg for acute stroke - continue home apixaban, atorvastatin  # History alcoholism Reports last drink 5 years ago  # PAD Normal ABIs last month and palpable distal pulses in left leg, do not think left leg dysfunction is 2/2 arterial disease - home statin, apixaban  # HTN Here bp mild elevation, also tachycardic in setting of holding home dilt and metop - resume home metop and dilt  # CKD 3a Kidney function is at baseline. Bicarb low today from normal yesterday - repeat bmp tomorrow, may need further eval if bicarb remains low  # Insomnia - home amitryptyline  # BPH - home proscar, flomax  # COPD Quiescent - incruse for home spiriva  # Neuropathy - home gabapentin  # GAD - home lora   DVT prophylaxis: home apixaban Code Status: dnr Family Communication: none at bedside  Level of care: Telemetry Medical Status is: Observation    Consultants:  neurology  Procedures: none  Antimicrobials:  none    Subjective: Reports ongoing sensation of weakness in left leg  Objective: Vitals:   10/26/23 0145 10/26/23 0400 10/26/23 0506 10/26/23 0600  BP:  Marland Kitchen)  151/92  (!) 147/83  Pulse: (!) 103 (!) 119  (!) 105  Resp: 12 11  13   Temp:   97.8 F (36.6 C)   TempSrc:   Oral   SpO2: 98% 100%  99%  Weight:      Height:       No intake or output data in the 24 hours ending 10/26/23 0820 Filed Weights   10/25/23 0605  Weight: 48.5 kg    Examination:  General exam: Appears calm and comfortable  Respiratory system: Clear to auscultation. Respiratory effort normal. Cardiovascular system: S1 & S2  heard, RRR. No JVD, murmurs, rubs, gallops or clicks. No pedal edema. Gastrointestinal system: Abdomen is nondistended, soft and nontender. No organomegaly or masses felt. Normal bowel sounds heard. Central nervous system: Alert and oriented. No focal neurological deficits. Extremities: Symmetric 5 x 5 power. Skin: No rashes, lesions or ulcers Psychiatry: Judgement and insight appear normal. Mood & affect appropriate.     Data Reviewed: I have personally reviewed following labs and imaging studies  CBC: Recent Labs  Lab 10/25/23 0607 10/26/23 0454  WBC 9.5 7.4  HGB 11.2* 10.3*  HCT 33.5* 30.9*  MCV 95.7 97.2  PLT 193 155   Basic Metabolic Panel: Recent Labs  Lab 10/25/23 0607 10/26/23 0454  NA 137 135  K 3.9 3.9  CL 106 105  CO2 24 18*  GLUCOSE 100* 67*  BUN 19 21*  CREATININE 1.51* 1.39*  CALCIUM 9.3 8.6*   GFR: Estimated Creatinine Clearance: 38.8 mL/min (A) (by C-G formula based on SCr of 1.39 mg/dL (H)). Liver Function Tests: No results for input(s): "AST", "ALT", "ALKPHOS", "BILITOT", "PROT", "ALBUMIN" in the last 168 hours. No results for input(s): "LIPASE", "AMYLASE" in the last 168 hours. No results for input(s): "AMMONIA" in the last 168 hours. Coagulation Profile: No results for input(s): "INR", "PROTIME" in the last 168 hours. Cardiac Enzymes: No results for input(s): "CKTOTAL", "CKMB", "CKMBINDEX", "TROPONINI" in the last 168 hours. BNP (last 3 results) No results for input(s): "PROBNP" in the last 8760 hours. HbA1C: No results for input(s): "HGBA1C" in the last 72 hours. CBG: No results for input(s): "GLUCAP" in the last 168 hours. Lipid Profile: Recent Labs    10/26/23 0454  CHOL 98  HDL 39*  LDLCALC 38  TRIG 161  CHOLHDL 2.5   Thyroid Function Tests: No results for input(s): "TSH", "T4TOTAL", "FREET4", "T3FREE", "THYROIDAB" in the last 72 hours. Anemia Panel: No results for input(s): "VITAMINB12", "FOLATE", "FERRITIN", "TIBC", "IRON",  "RETICCTPCT" in the last 72 hours. Urine analysis:    Component Value Date/Time   COLORURINE YELLOW (A) 10/25/2023 1802   APPEARANCEUR CLEAR (A) 10/25/2023 1802   APPEARANCEUR Clear 11/18/2011 1958   LABSPEC 1.023 10/25/2023 1802   LABSPEC 1.002 11/18/2011 1958   PHURINE 5.0 10/25/2023 1802   GLUCOSEU NEGATIVE 10/25/2023 1802   GLUCOSEU Negative 11/18/2011 1958   HGBUR NEGATIVE 10/25/2023 1802   BILIRUBINUR NEGATIVE 10/25/2023 1802   BILIRUBINUR Negative 11/18/2011 1958   KETONESUR 5 (A) 10/25/2023 1802   PROTEINUR NEGATIVE 10/25/2023 1802   NITRITE NEGATIVE 10/25/2023 1802   LEUKOCYTESUR NEGATIVE 10/25/2023 1802   LEUKOCYTESUR Negative 11/18/2011 1958   Sepsis Labs: @LABRCNTIP (procalcitonin:4,lacticidven:4)  ) Recent Results (from the past 240 hours)  Resp panel by RT-PCR (RSV, Flu A&B, Covid) Anterior Nasal Swab     Status: None   Collection Time: 10/25/23  2:41 PM   Specimen: Anterior Nasal Swab  Result Value Ref Range Status   SARS Coronavirus 2  by RT PCR NEGATIVE NEGATIVE Final    Comment: (NOTE) SARS-CoV-2 target nucleic acids are NOT DETECTED.  The SARS-CoV-2 RNA is generally detectable in upper respiratory specimens during the acute phase of infection. The lowest concentration of SARS-CoV-2 viral copies this assay can detect is 138 copies/mL. A negative result does not preclude SARS-Cov-2 infection and should not be used as the sole basis for treatment or other patient management decisions. A negative result may occur with  improper specimen collection/handling, submission of specimen other than nasopharyngeal swab, presence of viral mutation(s) within the areas targeted by this assay, and inadequate number of viral copies(<138 copies/mL). A negative result must be combined with clinical observations, patient history, and epidemiological information. The expected result is Negative.  Fact Sheet for Patients:  BloggerCourse.com  Fact  Sheet for Healthcare Providers:  SeriousBroker.it  This test is no t yet approved or cleared by the [place] FDA and  has been authorized for detection and/or diagnosis of SARS-CoV-2 by FDA under an Emergency Use Authorization (EUA). This EUA will remain  in effect (meaning this test can be used) for the duration of the COVID-19 declaration under Section 564(b)(1) of the Act, 21 U.S.C.section 360bbb-3(b)(1), unless the authorization is terminated  or revoked sooner.       Influenza A by PCR NEGATIVE NEGATIVE Final   Influenza B by PCR NEGATIVE NEGATIVE Final    Comment: (NOTE) The Xpert Xpress SARS-CoV-2/FLU/RSV plus assay is intended as an aid in the diagnosis of influenza from Nasopharyngeal swab specimens and should not be used as a sole basis for treatment. Nasal washings and aspirates are unacceptable for Xpert Xpress SARS-CoV-2/FLU/RSV testing.  Fact Sheet for Patients: BloggerCourse.com  Fact Sheet for Healthcare Providers: SeriousBroker.it  This test is not yet approved or cleared by the [place] FDA and has been authorized for detection and/or diagnosis of SARS-CoV-2 by FDA under an Emergency Use Authorization (EUA). This EUA will remain in effect (meaning this test can be used) for the duration of the COVID-19 declaration under Section 564(b)(1) of the Act, 21 U.S.C. section 360bbb-3(b)(1), unless the authorization is terminated or revoked.     Resp Syncytial Virus by PCR NEGATIVE NEGATIVE Final    Comment: (NOTE) Fact Sheet for Patients: BloggerCourse.com  Fact Sheet for Healthcare Providers: SeriousBroker.it  This test is not yet approved or cleared by the [place] FDA and has been authorized for detection and/or diagnosis of SARS-CoV-2 by FDA under an Emergency Use Authorization (EUA). This EUA will remain in effect  (meaning this test can be used) for the duration of the COVID-19 declaration under Section 564(b)(1) of the Act, 21 U.S.C. section 360bbb-3(b)(1), unless the authorization is terminated or revoked.  Performed at [facility], [address] [place], [place] [address]          Radiology Studies: MR BRAIN WO CONTRAST Result Date: 10/25/2023 CLINICAL DATA:  Neuro deficit, acute, stroke suspected. Left leg weakness beginning yesterday. EXAM: MRI HEAD WITHOUT CONTRAST TECHNIQUE: Multiplanar, multiecho pulse sequences of the brain and surrounding structures were obtained without intravenous contrast. COMPARISON:  Head CT same day.  MRI 03/22/2021 FINDINGS: Brain: Diffusion imaging does not show any acute or subacute infarction or other cause of restricted diffusion. Chronic small-vessel ischemic changes affect the pons. No focal cerebellar insult. There are numerous old small vessel infarctions within both thalami. Extensive chronic small-vessel ischemic changes present throughout the hemispheric white matter. No large vessel territory stroke. No mass, hemorrhage, hydrocephalus or extra-axial collection. Compared  to the study of 2020, findings appear quite similar. Vascular: Major vessels at the base of the brain show flow. Skull and upper cervical spine: Negative Sinuses/Orbits: Mild seasonal mucosal thickening. No advanced sinus disease. Orbits negative. Other: None IMPRESSION: No acute finding by MRI. Extensive chronic small-vessel ischemic changes of the pons, thalami and hemispheric white matter. No change since 2020. Electronically Signed   By: Paulina Fusi M.D.   On: 10/25/2023 18:21   US Venous Img Lower Unilateral Left (DVT) Result Date: 10/25/2023 CLINICAL DATA:  Left lower extremity pain. EXAM: LEFT LOWER EXTREMITY VENOUS DOPPLER ULTRASOUND TECHNIQUE: Gray-scale sonography with graded compression, as well as color Doppler and duplex ultrasound were performed to evaluate the lower  extremity deep venous systems from the level of the common femoral vein and including the common femoral, femoral, profunda femoral, popliteal and calf veins including the posterior tibial, peroneal and gastrocnemius veins when visible. The superficial great saphenous vein was also interrogated. Spectral Doppler was utilized to evaluate flow at rest and with distal augmentation maneuvers in the common femoral, femoral and popliteal veins. COMPARISON:  None Available. FINDINGS: Contralateral Common Femoral Vein: Respiratory phasicity is normal and symmetric with the symptomatic side. No evidence of thrombus. Normal compressibility. Common Femoral Vein: No evidence of thrombus. Normal compressibility, respiratory phasicity and response to augmentation. Saphenofemoral Junction: No evidence of thrombus. Normal compressibility and flow on color Doppler imaging. Profunda Femoral Vein: No evidence of thrombus. Normal compressibility and flow on color Doppler imaging. Femoral Vein: No evidence of thrombus. Normal compressibility, respiratory phasicity and response to augmentation. Popliteal Vein: No evidence of thrombus. Normal compressibility, respiratory phasicity and response to augmentation. Calf Veins: No evidence of thrombus. Normal compressibility and flow on color Doppler imaging. Superficial Great Saphenous Vein: No evidence of thrombus. Normal compressibility. Venous Reflux:  None. Other Findings: No evidence of superficial thrombophlebitis or abnormal fluid collection. IMPRESSION: No evidence of left lower extremity deep venous thrombosis. Electronically Signed   By: Irish Lack M.D.   On: 10/25/2023 17:03   DG Chest Port 1 View Result Date: 10/25/2023 CLINICAL DATA:  Shortness of breath with exertion.  Weakness. EXAM: PORTABLE CHEST 1 VIEW COMPARISON:  05/31/2023 FINDINGS: Heart size is normal. Chronic aortic atherosclerosis is noted. Chronic pulmonary scarring, most prominent in the left lower lobe.  Findings appear similar to the study of September. Cannot rule out some coexistence left base pneumonia, but most if not all the findings in this area appear chronic. No pleural effusion. No acute bone finding. IMPRESSION: Chronic pulmonary scarring, most prominent in the left lower lobe. Cannot exclude some coexistent left base pneumonia, but most if not all the findings in this area appear chronic. Electronically Signed   By: Paulina Fusi M.D.   On: 10/25/2023 15:09   CT Head Wo Contrast Result Date: 10/25/2023 CLINICAL DATA:  Neuro deficit, acute, stroke suspected. Fell yesterday. Bilateral leg pain. EXAM: CT HEAD WITHOUT CONTRAST TECHNIQUE: Contiguous axial images were obtained from the base of the skull through the vertex without intravenous contrast. RADIATION DOSE REDUCTION: This exam was performed according to the departmental dose-optimization program which includes automated exposure control, adjustment of the mA and/or kV according to patient size and/or use of iterative reconstruction technique. COMPARISON:  MRI 03/23/2019 FINDINGS: Brain: Generalized brain atrophy without subjective lobar predominance. Old small vessel infarction within the right pons. No focal cerebellar insult. Old infarction in both thalami and basal ganglia. Advanced chronic small-vessel ischemic changes of the white matter. No sign of acute  infarction. No mass, hemorrhage, obstructive hydrocephalus or extra-axial collection. Ventricular size is in proportion to the degree of atrophy. Vascular: There is atherosclerotic calcification of the major vessels at the base of the brain. Skull: Negative Sinuses/Orbits: Clear/normal Other: None IMPRESSION: No acute CT finding. Atrophy and chronic small-vessel ischemic changes of the white matter. Old small vessel infarctions in the right pons, both thalami and basal ganglia. Electronically Signed   By: Paulina Fusi M.D.   On: 10/25/2023 15:08   MR LUMBAR SPINE WO CONTRAST Result Date:  10/25/2023 CLINICAL DATA:  Lumbar radiculopathy, increased fracture risk. EXAM: MRI LUMBAR SPINE WITHOUT CONTRAST TECHNIQUE: Multiplanar, multisequence MR imaging of the lumbar spine was performed. No intravenous contrast was administered. COMPARISON:  MRI of the lumbar spine November 07, 2008. FINDINGS: Segmentation:  Standard. Alignment:  Physiologic. Vertebrae: No fracture, evidence of discitis, or bone lesion. Schmorl node in the inferior endplate of L4. Conus medullaris and cauda equina: Conus extends to the T12-L1 level. Conus and cauda equina appear normal. Paraspinal and other soft tissues: Negative. Disc levels: T12-L1: No spinal canal or neural foraminal stenosis. L1-2: Minimal disc bulge. No spinal canal or neural foraminal stenosis. L2-3: Shallow disc bulge and mild facet degenerative changes resulting in mild bilateral neural foraminal narrowing. No significant spinal canal stenosis. L3-4: Shallow disc bulge with superimposed left foraminal annular tear and mild facet degenerative changes. Mild left neural foraminal narrowing. No significant spinal canal stenosis. L4-5: Disc bulge, mild facet degenerative changes and ligamentum flavum redundancy resulting in mild bilateral neural foraminal narrowing. No significant spinal canal stenosis. L5-S1: Mild facet degenerative changes. No spinal canal or neural foraminal stenosis. IMPRESSION: 1. Mild degenerative changes of the lumbar spine with mild neural foraminal narrowing at L2-L3, L3-L4 and L4-L5. 2. No high-grade spinal canal or neural foraminal stenosis at any level. Electronically Signed   By: Baldemar Lenis M.D.   On: 10/25/2023 13:02        Scheduled Meds:   stroke: early stages of recovery book   Does not apply Once   amitriptyline  10 mg Oral QHS   apixaban  2.5 mg Oral BID   ascorbic acid  250 mg Oral BID   atorvastatin  10 mg Oral QHS   diltiazem  180 mg Oral Daily   finasteride  5 mg Oral Daily   gabapentin  100 mg Oral  TID   metoprolol succinate  100 mg Oral Daily   tamsulosin  0.4 mg Oral QPC supper   tiotropium  1 capsule Inhalation Daily   traZODone  50 mg Oral QHS   Continuous Infusions:   LOS: 0 days     Silvano Bilis, MD [facility]   If 7PM-7AM, please contact night-coverage [email or web address] Password [email or web address] 10/26/2023, 8:20 AM

## 2023-10-26 NOTE — TOC Progression Note (Addendum)
 Transition of Care (TOC) - Progression Note    Patient Details  Name: Donald [name]. MRN: [id number] Date of Birth: 07-Jan-1961  Transition of Care [facility]) CM/SW Contact  Erin Sons, [place] Phone Number: 10/26/2023, 10:03 AM  Clinical Narrative:     Pt from [facility] [place] location. CSW called [phone])and spoke with care manager, Tammy. Informed Tammy of likely DC tomorrow. Pt can return if medically stable. They are able to provide transportation at DC. Progress notes can be faxed to [phone].  1630: progress notes faxed                     Social Determinants of Health (SDOH) Interventions SDOH Screenings   Food Insecurity: No Food Insecurity (10/12/2023)   Received from [facility]  Housing: Unknown (10/12/2023)   Received from [facility]  Transportation Needs: No Transportation Needs (10/12/2023)   Received from [facility]  Utilities: Not At Risk (10/12/2023)   Received from [facility]  Depression [phone]): Low Risk  (02/28/2019)  Financial Resource Strain: Low Risk  (10/12/2023)   Received from [facility]  Physical Activity: Unknown (03/13/2019)  Tobacco Use: Medium Risk (10/25/2023)    Readmission Risk Interventions     No data to display

## 2023-10-26 NOTE — ED Notes (Addendum)
 Pt is asking for a percocet. Per MAR there is none ordered. RN offered a PO tylenol. Pt is refusing PO tylenol. Pt states "I take percocet 4 x a day".

## 2023-10-26 NOTE — Evaluation (Addendum)
 Physical Therapy Evaluation Patient Details Name: Donald [name]. MRN: [id number] DOB: 05-10-1961 Today's Date: 10/26/2023  History of Present Illness  presented to ER secondary to fall at ALF with reports of L LE pain.  MRI of brain, L LE doppler and L-spine MRI negative for acute insult/injury.  Clinical Impression  Patient resting in bed upon arrival to session; alert and oriented, follows commands and agreeable to participation with session.  Endorses persistent pain to L LE; improves with rest and immobilization, exacerbated with movement and WBing.  Patient verbally rates as 10/10, but clinical indicators of pain somewhat inconsistent with verbalized pain rating.  No specific point tenderness to palpation (appears generalized to hip/femur), mild functional ROM limitations (to L hip) appreciated.  Is slightly guarded in L hip flexion with movement transitions, but able to complete with no greater than min assist from therapist. Currently completes bed mobility with cga/min assist; sit/stand, basic transfers and gait (30') with RW, cga/close sup.  Demonstrates 3-point, step to gait pattern; very heavy WBing bilat UEs with decreased stance time, WBing L LE. Steady without buckling, LOB or safety concern; distance limited by pain. Do recommend continued use of RW (versus 4WRW that patient has available at baseline) for additional support/stabilization  Would benefit from skilled PT to address above deficits and promote optimal return to PLOF.; recommend post-acute PT follow up as indicated by interdisciplinary care team.            If plan is discharge home, recommend the following: A little help with walking and/or transfers;A little help with bathing/dressing/bathroom   Can travel by private vehicle        Equipment Recommendations Rolling walker (2 wheels)  Recommendations for Other Services       Functional Status Assessment Patient has had a recent decline in their functional status  and demonstrates the ability to make significant improvements in function in a reasonable and predictable amount of time.     Precautions / Restrictions Precautions Precautions: Fall Restrictions Weight Bearing Restrictions Per Provider Order: No      Mobility  Bed Mobility Overal bed mobility: Needs Assistance Bed Mobility: Supine to Sit     Supine to sit: Contact guard, Min assist     General bed mobility comments: requesting assist for L LE (often prior to attempting mobilization indep), but able to mobilize without direct lift/support assist    Transfers Overall transfer level: Needs assistance Equipment used: Rolling walker (2 wheels) Transfers: Sit to/from Stand Sit to Stand: Contact guard assist                Ambulation/Gait Ambulation/Gait assistance: Contact guard assist, Supervision Gait Distance (Feet): 30 Feet Assistive device: Rolling walker (2 wheels)         General Gait Details: 3-point, step to gait pattern; very heavy WBing bilat UEs with decreased stance time, WBing L LE.  Steady without buckling, LOB or safety concern; distance limited by pain.  Do recommend continued use of RW (versus 4WRW that patient has available at baseline) for additional support/stabilization  Stairs            Wheelchair Mobility     Tilt Bed    Modified Rankin (Stroke Patients Only)       Balance Overall balance assessment: Needs assistance Sitting-balance support: No upper extremity supported, Feet supported       Standing balance support: Bilateral upper extremity supported Standing balance-Leahy Scale: Fair  Pertinent Vitals/Pain Pain Assessment Pain Score: 10-Worst pain ever Pain Location: L femur and lateral aspect of L knee Pain Descriptors / Indicators: Sore Pain Intervention(s): Monitored during session, Limited activity within patient's tolerance, Repositioned    Home Living Family/patient  expects to be discharged to:: Assisted living                   Additional Comments: 74yrs at [facility], no steps, access to walk in and tub/shower    Prior Function Prior Level of Function : Independent/Modified Independent;History of Falls (last six months)             Mobility Comments: Indep without assist device for ADLs, household mobilization; denies additional fall history ADLs Comments: indep with ADL     Extremity/Trunk Assessment   Upper Extremity Assessment Upper Extremity Assessment: Overall WFL for tasks assessed    Lower Extremity Assessment Lower Extremity Assessment: Generalized weakness;LLE deficits/detail RLE Deficits / Details: hx 3rd toe amp LLE Deficits / Details: L hip/knee slightly guarded with isolated movement (grossly at least 4-/5 strength), but does appear to move well throughout functional range with functional activities and movement transitions       Communication   Communication Communication: No apparent difficulties    Cognition Arousal: Alert Behavior During Therapy: WFL for tasks assessed/performed   PT - Cognitive impairments: No apparent impairments                         Following commands: Intact       Cueing       General Comments      Exercises     Assessment/Plan    PT Assessment Patient needs continued PT services  PT Problem List Decreased range of motion;Decreased activity tolerance;Decreased balance;Decreased mobility;Decreased coordination;Decreased knowledge of use of DME;Decreased safety awareness;Decreased knowledge of precautions;Pain       PT Treatment Interventions DME instruction;Gait training;Stair training;Functional mobility training;Therapeutic activities;Balance training;Therapeutic exercise;Patient/family education    PT Goals (Current goals can be found in the Care Plan section)  Acute Rehab PT Goals Patient Stated Goal: to get my pain medicine straightened out PT Goal  Formulation: With patient Time For Goal Achievement: 11/09/23 Potential to Achieve Goals: Good    Frequency Min 1X/week     Co-evaluation               AM-PAC PT "6 Clicks" Mobility  Outcome Measure Help needed turning from your back to your side while in a flat bed without using bedrails?: None Help needed moving from lying on your back to sitting on the side of a flat bed without using bedrails?: A Little Help needed moving to and from a bed to a chair (including a wheelchair)?: A Little Help needed standing up from a chair using your arms (e.g., wheelchair or bedside chair)?: A Little Help needed to walk in hospital room?: A Little Help needed climbing 3-5 steps with a railing? : A Little 6 Click Score: 19    End of Session   Activity Tolerance: Patient tolerated treatment well;Patient limited by pain Patient left: in bed;with call bell/phone within reach Nurse Communication: Mobility status PT Visit Diagnosis: Muscle weakness (generalized) (M62.81);Pain;Difficulty in walking, not elsewhere classified (R26.2) Pain - Right/Left: Left Pain - part of body: Leg    Time: 1610-9604 PT Time Calculation (min) (ACUTE ONLY): 16 min   Charges:   PT Evaluation $PT Eval Moderate Complexity: 1 Mod   PT General Charges $$ ACUTE  PT VISIT: 1 Visit         Jericho Cieslik H. Manson Passey, PT, DPT, NCS 10/26/23, 1:55 PM [phone]

## 2023-10-26 NOTE — ED Notes (Signed)
 Pt assisted to wheelchair to be taken to the toilet.

## 2023-10-26 NOTE — ED Notes (Signed)
 RN to bedside to introduce self to pt. Pt resting and in no acute distress.

## 2023-10-26 NOTE — Evaluation (Signed)
 Occupational Therapy Evaluation Patient Details Name: Donald [name]. MRN: [id number] DOB: 02/27/1961 Today's Date: 10/26/2023   History of Present Illness   presented to ER secondary to fall at ALF with reports of L LE pain.  MRI of brain, L LE doppler and L-spine MRI negative for acute insult/injury.     Clinical Impressions Pt was seen for OT evaluation this date. Prior to hospital admission, pt was generally independent with ADL and mobility at his ALF. Pt presents to acute OT demonstrating impaired ADL performance and functional mobility 2/2 L femur/LE pain, decreased balance, BLE strength, and activity tolerance (See OT problem list for additional functional deficits). Pt currently requires MIN-MOD A for LB ADL with LLE 2/2 pain. CGA for ADL mobility with heavy reliance on the RW to limit WBing to LLE. HR up to 130's with exertion. Denied SOB, wooziness. Pt would benefit from skilled OT services to address noted impairments and functional limitations (see below for any additional details) in order to maximize safety and independence while minimizing falls risk and caregiver burden.    If plan is discharge home, recommend the following:   A little help with walking and/or transfers;A little help with bathing/dressing/bathroom;Assistance with cooking/housework;Assist for transportation     Functional Status Assessment   Patient has had a recent decline in their functional status and demonstrates the ability to make significant improvements in function in a reasonable and predictable amount of time.     Equipment Recommendations   None recommended by OT     Recommendations for Other Services         Precautions/Restrictions   Precautions Precautions: Fall Restrictions Weight Bearing Restrictions Per Provider Order: No     Mobility Bed Mobility Overal bed mobility: Needs Assistance Bed Mobility: Supine to Sit     Supine to sit: Contact guard, Min assist      General bed mobility comments: requesting assist for L LE (often prior to attempting mobilization indep), but able to mobilize without direct lift/support assist    Transfers Overall transfer level: Needs assistance Equipment used: Rolling walker (2 wheels) Transfers: Sit to/from Stand Sit to Stand: Contact guard assist                  Balance Overall balance assessment: Needs assistance Sitting-balance support: No upper extremity supported, Feet supported Sitting balance-Leahy Scale: Good     Standing balance support: Bilateral upper extremity supported Standing balance-Leahy Scale: Fair                             ADL either performed or assessed with clinical judgement   ADL Overall ADL's : Needs assistance/impaired                     Lower Body Dressing: Bed level;Moderate assistance Lower Body Dressing Details (indicate cue type and reason): MOD A for L side to don sock             Functional mobility during ADLs: Contact guard assist;Rolling walker (2 wheels)       Vision         Perception         Praxis         Pertinent Vitals/Pain Pain Assessment Pain Assessment: 0-10 Pain Score: 10-Worst pain ever Pain Location: L femur and lateral aspect of L knee Pain Descriptors / Indicators: Sore Pain Intervention(s): Limited activity within patient's tolerance, Monitored during session,  Repositioned     Extremity/Trunk Assessment Upper Extremity Assessment Upper Extremity Assessment: Overall WFL for tasks assessed   Lower Extremity Assessment Lower Extremity Assessment: Generalized weakness;LLE deficits/detail RLE Deficits / Details: hx 3rd toe amp LLE Deficits / Details: L hip/knee slightly guarded with isolated movement (grossly at least 4-/5 strength), but does appear to move well throughout functional range with functional activities and movement transitions       Communication Communication Communication: No  apparent difficulties   Cognition Arousal: Alert Behavior During Therapy: WFL for tasks assessed/performed Cognition: No apparent impairments                               Following commands: Intact       Cueing  General Comments          Exercises     Shoulder Instructions      Home Living Family/patient expects to be discharged to:: Assisted living                                 Additional Comments: 34yrs at [facility], no steps, access to walk in and tub/shower      Prior Functioning/Environment Prior Level of Function : Independent/Modified Independent;History of Falls (last six months)             Mobility Comments: Indep without assist device for ADLs, household mobilization; denies additional fall history ADLs Comments: indep with ADL    OT Problem List: Decreased strength;Pain;Decreased activity tolerance;Impaired balance (sitting and/or standing);Decreased knowledge of use of DME or AE;Cardiopulmonary status limiting activity   OT Treatment/Interventions: Self-care/ADL training;Therapeutic exercise;Therapeutic activities      OT Goals(Current goals can be found in the care plan section)   Acute Rehab OT Goals Patient Stated Goal: get my pain meds OT Goal Formulation: With patient Time For Goal Achievement: 11/09/23 Potential to Achieve Goals: Good ADL Goals Pt Will Perform Lower Body Dressing: with modified independence;sit to/from stand Pt Will Transfer to Toilet: with modified independence;ambulating (LRAD) Pt Will Perform Toileting - Clothing Manipulation and hygiene: with modified independence Additional ADL Goal #1: Pt will complete all aspects of bathing primarily from seated position with modified independence, 1/1 opportunity.   OT Frequency:  Min 1X/week    Co-evaluation              AM-PAC OT "6 Clicks" Daily Activity     Outcome Measure Help from another person eating meals?: None Help from another  person taking care of personal grooming?: None Help from another person toileting, which includes using toliet, bedpan, or urinal?: A Little Help from another person bathing (including washing, rinsing, drying)?: A Little Help from another person to put on and taking off regular upper body clothing?: None Help from another person to put on and taking off regular lower body clothing?: A Little 6 Click Score: 21   End of Session Equipment Utilized During Treatment: Rolling walker (2 wheels)  Activity Tolerance: Patient tolerated treatment well Patient left: in bed;with call bell/phone within reach;with bed alarm set  OT Visit Diagnosis: Other abnormalities of gait and mobility (R26.89);Muscle weakness (generalized) (M62.81);Pain Pain - Right/Left: Left Pain - part of body: Leg                Time: 8469-6295 OT Time Calculation (min): 16 min Charges:  OT Evaluation $OT Eval Low Complexity: 1 Low  Asher Muir  R., MPH, MS, OTR/L ascom [phone] 10/26/23, 3:43 PM

## 2023-10-26 NOTE — Consult Note (Signed)
 NEUROLOGY CONSULT NOTE   Date of service: October 26, 2023 Patient Name: Donald [name]. MRN:  [id number] DOB:  1961-02-01 Chief Complaint:  Left lower extremity weakness Requesting Provider: Kathrynn Running, MD  History of Present Illness  Donald [name]. is a 63 y.o. male resident of [facility], with a PMHx of depression, moderate COPD, CKD 3A, HTN, History of chicken pox, hyponatremia, Insomnia, neuropathy, PVD, Pneumonia, BPH, tobacco use, EtOH dependence with history of DTs, Seizures, Sensory ataxia, Sepsis and Strokes (states that he has had 8 strokes in the past) who presented to the ED on Monday night with a chief complaint of LLE that began acutely on Sunday, in addition to generalized weakness. He fell while opening the door to his room, which is when he suddenly felt his left leg become weak, followed by it giving out. He did not strike his head or lose consciousness. On arrival to the ED he was somewhat hypotensive but this later normalized. Neurological exam by EDP was nonfocal except for proximal left leg weakness. He had normal pulses and sensation throughout the left leg. His home medications include Eliquis 2.5 mg BID. At baseline he ambulates without a walker.   He states that the weakness of his LLE primarily involves his thigh, with difficulty flexing his leg at the hip, but also with significant weakness of leg extension. He denies sensory loss in either of his lower extremities. Does not feel that his circulation is impaired and this does not feel like previous episodes of weakness that were due to his PAD.   ED Course: - An ultrasound of the LLE revealed no evidence of left lower extremity deep venous thrombosis. .  - MRI brain showed no acute abnormality. Extensive chronic small-vessel ischemic changes are seen within the pons, thalami and cerebral hemispheric white matter, with no change since 2020. - MRI of lumbar spine shows mild degenerative changes  of the lumbar spine with mild neural foraminal narrowing at L2-L3, L3-L4 and L4-L5. No high-grade spinal canal or neural foraminal stenosis at any level.      ROS  No SOB, CP, fever, cough, N/V, vision changes, weakness of upper extremities, aphasia, vision loss or weakness of RLE. Other ROS as per HPI.   Past History   Past Medical History:  Diagnosis Date   Depression    Emphysema of lung (HCC)    Essential hypertension    History of chicken pox    Hyponatremia    Insomnia    Low weight    MRSA nasal colonization    Neuropathy    bilateral lower legs   Peripheral vascular disease (HCC)    Pneumonia    Prostate disease    Seasonal allergies    Seizures (HCC)    Sensory ataxia    Sepsis (HCC)    Stroke (HCC)    Wears dentures    full upper    Past Surgical History:  Procedure Laterality Date   ABDOMINAL AORTIC ENDOVASCULAR STENT GRAFT Bilateral 03/16/2019   Procedure: AORTIC ILIAC STENT;  Surgeon: Annice Needy, MD;  Location: [facility];  Service: Vascular;  Laterality: Bilateral;   AMPUTATION TOE Right 06/16/2019   Procedure: 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;  Surgeon: Gwyneth Revels, DPM;  Location: [facility];  Service: Podiatry;  Laterality: Right;   CATARACT EXTRACTION W/PHACO Left 05/18/2018   Procedure: CATARACT EXTRACTION PHACO AND INTRAOCULAR LENS PLACEMENT (IOC) LEFT;  Surgeon: Lockie Mola, MD;  Location: [facility];  Service: Ophthalmology;  Laterality: Left;   COLONOSCOPY WITH PROPOFOL N/A 02/25/2016   Procedure: COLONOSCOPY WITH PROPOFOL;  Surgeon: Midge Minium, MD;  Location: [facility];  Service: Endoscopy;  Laterality: N/A;   ENDARTERECTOMY FEMORAL Bilateral 03/16/2019   Procedure: ENDARTERECTOMY FEMORAL;  Surgeon: Annice Needy, MD;  Location: [facility];  Service: Vascular;  Laterality: Bilateral;   EYE SURGERY  2015   Cataract   FRACTURE SURGERY     ORIF wrist   LOWER EXTREMITY ANGIOGRAPHY Left 03/13/2019   Procedure: Lower Extremity Angiography  (LEFT);  Surgeon: Annice Needy, MD;  Location: [facility];  Service: Cardiovascular;  Laterality: Left;   VASCULAR SURGERY     "7 stents in legs"    Family History: Family History  Problem Relation Age of Onset   Ulcers Father        bleeding   Cancer Mother        Breast    Social History  reports that he has quit smoking. His smoking use included cigarettes. He started smoking about 40 years ago. He has a 20.4 pack-year smoking history. He quit smokeless tobacco use about 7 years ago. He reports that he does not currently use alcohol. He reports that he does not use drugs.  Allergies  Allergen Reactions   Lidocaine Other (See Comments)    unknown    Medications   Current Facility-Administered Medications:     stroke: early stages of recovery book, , Does not apply, Once, Cox, Amy N, DO   acetaminophen (TYLENOL) tablet 650 mg, 650 mg, Oral, Q4H PRN, 650 mg at 10/25/23 1821 **OR** acetaminophen (TYLENOL) 160 MG/5ML solution 650 mg, 650 mg, Per Tube, Q4H PRN **OR** acetaminophen (TYLENOL) suppository 650 mg, 650 mg, Rectal, Q4H PRN, Cox, Amy N, DO   albuterol (PROVENTIL) (2.5 MG/3ML) 0.083% nebulizer solution 3 mL, 3 mL, Nebulization, Q6H PRN, Cox, Amy N, DO   amitriptyline (ELAVIL) tablet 10 mg, 10 mg, Oral, QHS, Cox, Amy N, DO, 10 mg at 10/25/23 2219   apixaban (ELIQUIS) tablet 2.5 mg, 2.5 mg, Oral, BID, Cox, Amy N, DO, 2.5 mg at 10/25/23 2219   ascorbic acid (VITAMIN C) tablet 250 mg, 250 mg, Oral, BID, Cox, Amy N, DO, 250 mg at 10/25/23 2218   atorvastatin (LIPITOR) tablet 10 mg, 10 mg, Oral, QHS, Cox, Amy N, DO, 10 mg at 10/25/23 2217   diltiazem (CARDIZEM CD) 24 hr capsule 180 mg, 180 mg, Oral, Daily, Wouk, Wilfred Curtis, MD   finasteride (PROSCAR) tablet 5 mg, 5 mg, Oral, Daily, Cox, Amy N, DO   gabapentin (NEURONTIN) capsule 100 mg, 100 mg, Oral, TID, Cox, Amy N, DO, 100 mg at 10/25/23 2217   hydrALAZINE (APRESOLINE) injection 5 mg, 5 mg, Intravenous, Q6H PRN, Cox,  Amy N, DO, 5 mg at 10/25/23 1819   lidocaine (LIDODERM) 5 % 1 patch, 1 patch, Transdermal, Q12H PRN, Cox, Amy N, DO, 1 patch at 10/25/23 1820   metoprolol succinate (TOPROL-XL) 24 hr tablet 100 mg, 100 mg, Oral, Daily, Wouk, Wilfred Curtis, MD   nicotine (NICODERM CQ - dosed in mg/24 hours) patch 21 mg, 21 mg, Transdermal, Daily PRN, Cox, Amy N, DO   senna-docusate (Senokot-S) tablet 1 tablet, 1 tablet, Oral, QHS PRN, Cox, Amy N, DO   tamsulosin (FLOMAX) capsule 0.4 mg, 0.4 mg, Oral, QPC supper, Cox, Amy N, DO, 0.4 mg at 10/25/23 1755   traZODone (DESYREL) tablet 50 mg, 50 mg, Oral, QHS, Cox, Amy N, DO, 50 mg  at 10/25/23 2217   umeclidinium bromide (INCRUSE ELLIPTA) 62.5 MCG/ACT 1 puff, 1 puff, Inhalation, Daily, Madueme, Elvira C, RPH  Current Outpatient Medications:    acetaminophen (TYLENOL) 500 MG tablet, Take 500 mg by mouth every 6 (six) hours as needed for moderate pain. , Disp: , Rfl:    albuterol (VENTOLIN HFA) 108 (90 Base) MCG/ACT inhaler, Inhale 2 puffs into the lungs every 6 (six) hours as needed for wheezing or shortness of breath., Disp: 8.5 g, Rfl: 2   amitriptyline (ELAVIL) 10 MG tablet, Take 10 mg by mouth at bedtime., Disp: , Rfl:    atorvastatin (LIPITOR) 10 MG tablet, Take 10 mg by mouth daily., Disp: , Rfl:    Carboxymeth-Glycerin-Polysorb (REFRESH DIGITAL) 0.5-1-0.5 % SOLN, Apply to eye., Disp: , Rfl:    cholecalciferol (VITAMIN D) 25 MCG tablet, Take 1 tablet (1,000 Units total) by mouth daily., Disp:  , Rfl:    diltiazem (CARDIZEM CD) 240 MG 24 hr capsule, Take 1 capsule (240 mg total) by mouth daily., Disp: , Rfl:    docusate sodium (COLACE) 100 MG capsule, Take 100 mg by mouth daily., Disp: , Rfl:    ELIQUIS 2.5 MG TABS tablet, Take 2.5 mg by mouth 2 (two) times daily., Disp: , Rfl:    ferrous sulfate 325 (65 FE) MG EC tablet, Take 325 mg by mouth daily at 6 (six) AM., Disp: , Rfl:    finasteride (PROSCAR) 5 MG tablet, Take 1 tablet (5 mg total) by mouth daily., Disp: 30  tablet, Rfl: 11   gabapentin (NEURONTIN) 100 MG capsule, Take 100 mg by mouth 3 (three) times daily., Disp: , Rfl:    hydrALAZINE (APRESOLINE) 50 MG tablet, Take 50 mg by mouth in the morning, at noon, in the evening, and at bedtime., Disp: , Rfl:    lidocaine (LIDODERM) 5 %, Place 1 patch onto the skin every 12 (twelve) hours. Remove & Discard patch within 12 hours or as directed by MD, Disp: 10 patch, Rfl: 0   losartan (COZAAR) 25 MG tablet, Take 25 mg by mouth daily., Disp: , Rfl:    MAGNESIUM PO, Take by mouth., Disp: , Rfl:    Melatonin 1 MG/4ML LIQD, Take by mouth., Disp: , Rfl:    metoprolol succinate (TOPROL-XL) 50 MG 24 hr tablet, Take 2 tablets (100 mg total) by mouth daily., Disp: 90 tablet, Rfl: 1   naproxen (NAPROSYN) 500 MG tablet, Take 500 mg by mouth 2 (two) times daily., Disp: , Rfl:    oxyCODONE-acetaminophen (PERCOCET) 10-325 MG tablet, Take 1 tablet by mouth every 6 (six) hours as needed., Disp: , Rfl:    SPIRIVA HANDIHALER 18 MCG inhalation capsule, 1 capsule daily., Disp: , Rfl:    tamsulosin (FLOMAX) 0.4 MG CAPS capsule, Take 1 capsule (0.4 mg total) by mouth daily after supper., Disp: 90 capsule, Rfl: 3   traZODone (DESYREL) 50 MG tablet, Take 50 mg by mouth at bedtime., Disp: , Rfl:    vitamin C (VITAMIN C) 250 MG tablet, Take 1 tablet (250 mg total) by mouth 2 (two) times daily., Disp:  , Rfl:    apixaban (ELIQUIS) 5 MG TABS tablet, Take 1 tablet (5 mg total) by mouth 2 (two) times daily. (Patient not taking: Reported on 10/25/2023), Disp: 60 tablet, Rfl:   Vitals   Vitals:   10/26/23 0145 10/26/23 0400 10/26/23 0506 10/26/23 0600  BP:  (!) 151/92  (!) 147/83  Pulse: (!) 103 (!) 119  (!) 105  Resp: 12  11  13  Temp:   97.8 F (36.6 C)   TempSrc:   Oral   SpO2: 98% 100%  99%  Weight:      Height:        Body mass index is 16.27 kg/m.  Physical Exam   Physical Exam HEENT- [place]/AT   Lungs- Respirations unlabored Extremities- Warm and well-perfused. No cyanosis  or pallor of the proximal or distal BLE.   Neurological Examination Mental Status: Awake and alert. Fully oriented. Thought content appropriate.  Speech fluent without evidence of aphasia.  Able to follow all commands without difficulty. Cranial Nerves: II: Temporal visual fields intact with no extinction to DSS. PERRL. III,IV, VI: No ptosis. EOMI. No nystagmus. V: Temp sensation equal bilaterally VII: Smile symmetric VIII: Hearing intact to voice IX,X: No hypophonia or hoarseness XI: Symmetric XII: Midline tongue extension Motor: RUE: 4+/5 proximally and distally LUE: 4+/5 proximally and distally RLE: 4-/5 hip flexion, 4+/5 knee extension and flexion, 5/5 ADF and APF LLE: 2-3/5 hip flexion (also with pain on movement), 3/5 hip adduction, 4-/5 knee extension and flexion, 4/5 ADF and APF Muscle bulk of BUE and BLE is diffusely decreased without asymmetry. No fasciculations noted.  Sensory: Temp and FT intact x 4 proximally and distally. No extinction to DSS. Deep Tendon Reflexes: 2+ and symmetric bilateral biceps, brachioradialis and patellae Plantars: Right: downgoingLeft: downgoing Cerebellar: No ataxia with FNF or H-S bilaterally. He has significant difficulty with left H-S due to weakness and thigh pain with movement Gait: Deferred   Labs/Imaging/Neurodiagnostic studies   CBC:  Recent Labs  Lab 11/24/2023 0607 10/26/23 0454  WBC 9.5 7.4  HGB 11.2* 10.3*  HCT 33.5* 30.9*  MCV 95.7 97.2  PLT 193 155   Basic Metabolic Panel:  Lab Results  Component Value Date   NA 135 10/26/2023   K 3.9 10/26/2023   CO2 18 (L) 10/26/2023   GLUCOSE 67 (L) 10/26/2023   BUN 21 (H) 10/26/2023   CREATININE 1.39 (H) 10/26/2023   CALCIUM 8.6 (L) 10/26/2023   GFRNONAA 58 (L) 10/26/2023   GFRAA >60 03/24/2019   Lipid Panel:  Lab Results  Component Value Date   LDLCALC 38 10/26/2023   HgbA1c:  Lab Results  Component Value Date   HGBA1C 4.9 01/05/2019   Urine Drug Screen:      Component Value Date/Time   LABOPIA NONE DETECTED 03/13/2019 0956   COCAINSCRNUR NONE DETECTED 03/13/2019 0956   LABBENZ POSITIVE (A) 03/13/2019 0956   AMPHETMU NONE DETECTED 03/13/2019 0956   THCU NONE DETECTED 03/13/2019 0956   LABBARB NONE DETECTED 03/13/2019 0956    Alcohol Level     Component Value Date/Time   ETH <10 03/13/2019 0956   INR  Lab Results  Component Value Date   INR 1.3 (H) 03/23/2021   APTT  Lab Results  Component Value Date   APTT 75 (H) 03/16/2019     ASSESSMENT  63 y.o. male resident of [facility], with a PMHx of depression, moderate COPD, CKD 3A, HTN, History of chicken pox, hyponatremia, Insomnia, neuropathy, PVD, Pneumonia, BPH, tobacco use, EtOH dependence with history of DTs, Seizures, Sensory ataxia, Sepsis and Strokes (states that he has had 8 strokes in the past) who presented to the ED on Monday night with a chief complaint of LLE that began acutely on Sunday, in addition to generalized weakness. He fell while opening the door to his room, which is when he suddenly felt his left leg become weak, followed by  it giving out. He did not strike his head or lose consciousness. On arrival to the ED he was somewhat hypotensive but this later normalized. Neurological exam by EDP was nonfocal except for proximal left leg weakness. He had normal pulses and sensation throughout the left leg. His home medications include Eliquis 2.5 mg BID. At baseline he ambulates without a walker. He states that the weakness of his LLE primarily involves his thigh, with difficulty flexing his leg at the hip, but also with significant weakness of leg extension. He denies sensory loss in either of his lower extremities. Does not feel that his circulation is impaired and this does not feel like previous episodes of weakness that were due to his PAD.  - Exam reveals focal weakness of his LLE, significantly worse proximally with 2-3/5 hip flexion, 3/5 hip adduction, 4-/5  knee extension and flexion, 4/5 ADF and APF - CT head: No acute CT finding. Atrophy and chronic small-vessel ischemic changes of the white matter. Old small vessel infarctions in the right pons, both thalami and basal ganglia. - MRI brain: No acute finding by MRI. Extensive chronic small-vessel ischemic changes of the pons, thalami and hemispheric white matter. No change since 2020. - MRI L-spine: Mild degenerative changes of the lumbar spine with mild neural foraminal narrowing at L2-L3, L3-L4 and L4-L5. No high-grade spinal canal or neural foraminal stenosis at any level.  - An ultrasound of the LLE reveals no evidence of left lower extremity deep venous thrombosis. .  - Labs:  - WBC normal - Hgh low at 10.3 - Lipid panel normal except for low HDL  - Na and K are normal.  - Cr elevated at 1.39 with eGFR of 58 - Total Ca low at 8.6 (no albumin level) - Glucose low on this morning's blood draw at 67 - Last HgbA1c drawn in 2020 was normal - DDx for focal proximal > distal LLE weakness:  -Includes lumbosacral plexopathy, focal myopathy, iliopsoas focal lesion such as a hematoma or mass, and focal ischemic nerve injury of the femoral nerve.  - Per literature search, intermittent claudication is one cause of hip and thigh pain with weakness of hip flexion. Per excerpt from the literature: "During the past five years we have seen 47 patients with occlusive arterial disease involving the abdominal aorta or iliac arteries. The extent of the disease was demonstrated by aortography in 30 of these patients; 26 had typical intermittent claudication involving the hip, thigh or low back area without pain in the calf region." (LeFevre et al. Intermittent Claudication of the Hip. ShavedPoints.is) - Stroke and lumbar radiculopathy have been ruled out with MRI.   RECOMMENDATIONS  - CK level - Stop his atorvastatin for now - MRI of left thigh w/wo contrast to assess for  possible myopathy involving the rectus femoris, medial thigh muscles or iliopsoas (ordered)  - If the MRI does not adequately image the iliopsoas muscle, then obtain CT of pelvis to assess for possible left iliopsoas abscess, hemorrhage or mass - Hemoglobin A1c (ordered) - If above studies are unrevealing, he will need an outpatient Neurology referral for EMG/NCS  - May benefit from a Vascular Surgery consult to determine if they have seen focal femoral nerve ischemia before in patients with severe PAD or if his presentation could be due to claudication in the proximal left thigh.  ______________________________________________________________________    Dessa Phi, Micca Matura, MD [facility]

## 2023-10-27 ENCOUNTER — Inpatient Hospital Stay: Payer: Medicaid Other

## 2023-10-27 DIAGNOSIS — M25452 Effusion, left hip: Secondary | ICD-10-CM | POA: Diagnosis not present

## 2023-10-27 DIAGNOSIS — R600 Localized edema: Secondary | ICD-10-CM | POA: Diagnosis not present

## 2023-10-27 DIAGNOSIS — T82856A Stenosis of peripheral vascular stent, initial encounter: Secondary | ICD-10-CM | POA: Diagnosis not present

## 2023-10-27 DIAGNOSIS — M79605 Pain in left leg: Secondary | ICD-10-CM | POA: Diagnosis not present

## 2023-10-27 DIAGNOSIS — R29898 Other symptoms and signs involving the musculoskeletal system: Secondary | ICD-10-CM | POA: Diagnosis not present

## 2023-10-27 LAB — BASIC METABOLIC PANEL
Anion gap: 11 (ref 5–15)
BUN: 26 mg/dL — ABNORMAL HIGH (ref 6–20)
CO2: 19 mmol/L — ABNORMAL LOW (ref 22–32)
Calcium: 8.7 mg/dL — ABNORMAL LOW (ref 8.9–10.3)
Chloride: 106 mmol/L (ref 98–111)
Creatinine, Ser: 1.41 mg/dL — ABNORMAL HIGH (ref 0.61–1.24)
GFR, Estimated: 57 mL/min — ABNORMAL LOW (ref >60)
Glucose, Bld: 61 mg/dL — ABNORMAL LOW (ref 70–99)
Potassium: 3.9 mmol/L (ref 3.5–5.1)
Sodium: 136 mmol/L (ref 135–145)

## 2023-10-27 LAB — CK: Total CK: 44 U/L — ABNORMAL LOW (ref 49–397)

## 2023-10-27 LAB — HEMOGLOBIN A1C
Hgb A1c MFr Bld: 4.6 % — ABNORMAL LOW (ref 4.8–5.6)
Mean Plasma Glucose: 85.32 mg/dL

## 2023-10-27 MED ORDER — OXYCODONE-ACETAMINOPHEN 5-325 MG PO TABS
2.0000 | ORAL_TABLET | Freq: Four times a day (QID) | ORAL | Status: DC | PRN
  Administered 2023-10-27 – 2023-10-29 (×7): 2 via ORAL
  Filled 2023-10-27 (×8): qty 2

## 2023-10-27 MED ORDER — GADOBUTROL 1 MMOL/ML IV SOLN
5.0000 mL | Freq: Once | INTRAVENOUS | Status: AC | PRN
  Administered 2023-10-27: 5 mL via INTRAVENOUS

## 2023-10-27 MED ORDER — IOHEXOL 300 MG/ML  SOLN
100.0000 mL | Freq: Once | INTRAMUSCULAR | Status: AC | PRN
  Administered 2023-10-27: 100 mL via INTRAVENOUS

## 2023-10-27 NOTE — Progress Notes (Addendum)
 Occupational Therapy Treatment Patient Details Name: Donald [name]. MRN: [id number] DOB: [birth year] Today's Date: 10/27/2023   History of present illness Pt. presented to ER secondary to fall at ALF with reports of L LE pain.  MRI of brain, L LE doppler and L-spine MRI negative for acute insult/injury.   OT comments  Pt. requires modA LE ADL tasks. Pt. Education was provided about A/E use for LE ADLs. Pt. Will benefit from additional review with A/E for LE ADLs. Pt. will  benefit from OT services for ADL training, A/E training, UE ther. ex. and Pt./caregiver education about Pt. functional status. OT discharge recommendations remain appropriate.      If plan is discharge home, recommend the following:  A little help with walking and/or transfers;A little help with bathing/dressing/bathroom;Assistance with cooking/housework;Assist for transportation   Equipment Recommendations  None recommended by OT    Recommendations for Other Services      Precautions / Restrictions Restrictions Weight Bearing Restrictions Per Provider Order: No       Mobility Bed Mobility Overal bed mobility: Needs Assistance Bed Mobility: Supine to Sit     Supine to sit: Contact guard, Min assist     General bed mobility comments: requesting assist for L LE (often prior to attempting mobilization indep), but able to mobilize without direct lift/support assist    Transfers   Equipment used: Rolling walker (2 wheels) Transfers: Sit to/from Stand Sit to Stand: Contact guard assist          Comments: Mobility per chart        Balance                                           ADL either performed or assessed with clinical judgement   ADL                         Lower Body Dressing Details (indicate cue type and reason): MOD A for LE dressing                    Extremity/Trunk Assessment Upper Extremity Assessment Upper Extremity Assessment: Overall WFL  for tasks assessed            Vision       Perception     Praxis     Communication Communication Communication: No apparent difficulties   Cognition Arousal: Alert   Cognition: No apparent impairments                                        Cueing      Exercises      Shoulder Instructions       General Comments      Pertinent Vitals/ Pain       Pain Assessment Pain Assessment: 0-10 Pain Score: 3 at rest, 10 with activity. Pain Location: LLE Pain Descriptors / Indicators: Sore Pain Intervention(s): Limited activity within patient's tolerance, Monitored during session, Repositioned  Home Living                                          Prior Functioning/Environment  Frequency  Min 1X/week        Progress Toward Goals  OT Goals(current goals can now be found in the care plan section)  Progress towards OT goals: Progressing toward goals  Acute Rehab OT Goals Patient Stated Goal: To improve functional statues OT Goal Formulation: With patient Time For Goal Achievement: 11/09/23 Potential to Achieve Goals: Good  Plan      Co-evaluation                 AM-PAC OT "6 Clicks" Daily Activity     Outcome Measure   Help from another person eating meals?: None Help from another person taking care of personal grooming?: None Help from another person toileting, which includes using toliet, bedpan, or urinal?: A Little Help from another person bathing (including washing, rinsing, drying)?: A Little Help from another person to put on and taking off regular upper body clothing?: None Help from another person to put on and taking off regular lower body clothing?: A Little 6 Click Score: 21    End of Session    OT Visit Diagnosis: Other abnormalities of gait and mobility (R26.89);Muscle weakness (generalized) (M62.81);Pain Pain - Right/Left: Left   Activity Tolerance Patient tolerated treatment  well   Patient Left in bed;with call bell/phone within reach;with bed alarm set   Nurse Communication          Time: 6295-2841 OT Time Calculation (min): 14 min  Charges: OT Evaluation $OT Eval Low Complexity: 1 Low OT Treatments $Self Care/Home Management : 8-22 mins  Olegario Messier, MS, OTR/L   Olegario Messier 10/27/2023, 11:20 AM

## 2023-10-27 NOTE — Plan of Care (Signed)
  Problem: Education: Goal: Knowledge of disease or condition will improve Outcome: Progressing   Problem: Ischemic Stroke/TIA Tissue Perfusion: Goal: Complications of ischemic stroke/TIA will be minimized Outcome: Progressing   Problem: Health Behavior/Discharge Planning: Goal: Ability to manage health-related needs will improve Outcome: Progressing   Problem: Nutrition: Goal: Risk of aspiration will decrease Outcome: Progressing

## 2023-10-27 NOTE — Progress Notes (Signed)
 PROGRESS NOTE    Donald [name].   NGE:[id number] DOB: [birth year]  DOA: 10/25/2023 Date of Service: 10/27/23 which is hospital day 1  PCP: Ellan Lambert, NP    Hospital course / significant events:   HPI:  [name], is a 63 year old male with history of hyperlipidemia, hypertension, neuropathy, BPH, PAD, CKD 3A, moderate COPD, cigarette/tobacco user, history of CVA, insomnia, alcohol dependence with history of delirium tremens, who presents to the emergency department for chief concerns of left leg weakness. He reports that at baseline he ambulates without a walker. He endorses that he does live at assisted living due to having had 8 strokes in the past. He reports that yesterday, he fell because he was opening the door to his room when he suddenly felt left leg weakness, his left leg giving out on him and he fell. He denies loss of consciousness. He states that the weakness of his LLE primarily involves his thigh, with difficulty flexing his leg at the hip, but also with significant weakness of leg extension. He denies sensory loss in either of his lower extremities.   02/17: admitted to hospitalist service w/ concern for TIA/CVA. MRI brain showed nothing acute, (+)extensive chronic small vessel eischemic changes. LLE [place] Neg CVT. MRI L-spine (+)degenerative changes w/o high grade canal/foraminal stenosis  02/18: XR hip (+)degenerative joint disease no fracture/dislocation, Neurology saw pt - "Exam reveals focal weakness of his LLE, significantly worse proximally with 2-3/5 hip flexion, 3/5 hip adduction, 4-/5 knee extension and flexion, 4/5 ADF and APF." "DDx for focal proximal > distal LLE weakness... Includes lumbosacral plexopathy, focal myopathy, iliopsoas focal lesion such as a hematoma or mass, and focal ischemic nerve injury of the femoral nerve... Per literature search, intermittent claudication is one cause of hip and thigh pain with weakness of hip flexion." Recs for MRI L  thigh to assess for myopathy, which was ordered but not done this day. CT pelvis advised. CK level not collected. Hold statin. Consider vascular surgery consult/followup.  02/19: MRI L thigh/femur pending read. CT no iliopsoas abn but is showing some occlusive disease in bilateral common iliac and external iliac stents - I've asked vascular to weigh in on whether or to what extent this might be causing symptoms     Consultants:  Neurology   Procedures/Surgeries: none      ASSESSMENT & PLAN:   Left leg weakness Have reasonably ruled out CVA, lumbar disc disease / nerve compression Ddx Includes lumbosacral plexopathy, focal myopathy, iliopsoas focal lesion such as a hematoma or mass, focal ischemic nerve injury of the femoral nerve, intermittent claudication  Neurology following  Follow CK levels  Holding statin  MRI / CT L pelvis/thigh Consider outpatient Neurology referral for EMG/NCS    History CVA MRI neg for acute stroke holding statin as above continue apixaban   PAD Normal ABIs last month and palpable distal pulses in left leg, do not think left leg dysfunction is 2/2 arterial disease holding statin as above continue apixaban Vascular consult to comment on posisbility claudication may be causing weakness   HTN Here bp mild elevation, also tachycardic in setting of holding home dilt and metop resume home metop and dilt   CKD 3a Kidney function is at baseline. Bicarb low today from normal yesterday repeat bmp tomorrow, may need further eval if bicarb remains low   Insomnia home amitryptyline   BPH home proscar, flomax   COPD Quiescent incruse for home spiriva   Neuropathy home  gabapentin   GAD home lora  History alcoholism Reports last drink 5 years ago No intervention     underweight based on BMI: Body mass index is 16.27 kg/m.  Underweight - under 18  overweight - 25 to 29 obese - 30 or more Class 1 obesity: BMI of 30.0 to 34 Class 2  obesity: BMI of 35.0 to 39 Class 3 obesity: BMI of 40.0 to 49 Super Morbid Obesity: BMI 50-59 Super-super Morbid Obesity: BMI 60+ Significantly low or high BMI is associated with higher medical risk.  Weight management advised as adjunct to other disease management and risk reduction treatments    DVT prophylaxis: eliquis  IV fluids: no continuous IV fluids  Nutrition: cardiac diet Central lines / invasive devices: none  Code Status: DNR ACP documentation reviewed:  none on file in [facility]  TOC needs: home health PT/OT Barriers to dispo / significant pending items: vascular / neuro consult              Subjective / Brief ROS:  Patient reports weakness is about the same Denies CP/SOB.  Pain controlled.  Denies new weakness.  Tolerating diet.  Reports no concerns w/ urination/defecation.   Family Communication: none at this time     Objective Findings:  Vitals:   10/26/23 2012 10/27/23 0423 10/27/23 0807 10/27/23 0810  BP: 131/79 122/71 137/75 137/75  Pulse: 87 73 79 79  Resp:   18   Temp: 98.2 F (36.8 C) (!) 97.5 F (36.4 C)    TempSrc:      SpO2: 98% 97% 98%   Weight:      Height:       No intake or output data in the 24 hours ending 10/27/23 1645 Filed Weights   10/25/23 0605  Weight: 48.5 kg    Examination:  Physical Exam Constitutional:      General: He is not in acute distress. Cardiovascular:     Rate and Rhythm: Normal rate and regular rhythm.  Pulmonary:     Effort: Pulmonary effort is normal.     Breath sounds: Normal breath sounds.  Abdominal:     Palpations: Abdomen is soft.  Musculoskeletal:     Right lower leg: No edema.     Left lower leg: No edema.  Neurological:     Mental Status: He is alert and oriented to person, place, and time. Mental status is at baseline.  Psychiatric:        Mood and Affect: Mood normal.        Behavior: Behavior normal.          Scheduled Medications:    stroke: early stages of recovery  book   Does not apply Once   amitriptyline  10 mg Oral QHS   apixaban  2.5 mg Oral BID   ascorbic acid  250 mg Oral BID   diltiazem  180 mg Oral Daily   finasteride  5 mg Oral Daily   gabapentin  500 mg Oral TID   metoprolol succinate  50 mg Oral Daily   tamsulosin  0.4 mg Oral BID AC   traZODone  50 mg Oral QHS   umeclidinium bromide  1 puff Inhalation Daily    Continuous Infusions:   PRN Medications:  acetaminophen **OR** acetaminophen (TYLENOL) oral liquid 160 mg/5 mL **OR** acetaminophen, albuterol, lidocaine, nicotine, oxyCODONE-acetaminophen, senna-docusate  Antimicrobials from admission:  Anti-infectives (From admission, onward)    None           Data Reviewed:  I have personally reviewed the following...  CBC: Recent Labs  Lab 10/25/23 0607 10/26/23 0454  WBC 9.5 7.4  HGB 11.2* 10.3*  HCT 33.5* 30.9*  MCV 95.7 97.2  PLT 193 155   Basic Metabolic Panel: Recent Labs  Lab 10/25/23 0607 10/26/23 0454 10/27/23 0521  NA 137 135 136  K 3.9 3.9 3.9  CL 106 105 106  CO2 24 18* 19*  GLUCOSE 100* 67* 61*  BUN 19 21* 26*  CREATININE 1.51* 1.39* 1.41*  CALCIUM 9.3 8.6* 8.7*   GFR: Estimated Creatinine Clearance: 38.2 mL/min (A) (by C-G formula based on SCr of 1.41 mg/dL (H)). Liver Function Tests: No results for input(s): "AST", "ALT", "ALKPHOS", "BILITOT", "PROT", "ALBUMIN" in the last 168 hours. No results for input(s): "LIPASE", "AMYLASE" in the last 168 hours. No results for input(s): "AMMONIA" in the last 168 hours. Coagulation Profile: No results for input(s): "INR", "PROTIME" in the last 168 hours. Cardiac Enzymes: Recent Labs  Lab 10/27/23 0521  CKTOTAL 44*   BNP (last 3 results) No results for input(s): "PROBNP" in the last 8760 hours. HbA1C: Recent Labs    10/26/23 0454  HGBA1C 4.6*   CBG: No results for input(s): "GLUCAP" in the last 168 hours. Lipid Profile: Recent Labs    10/26/23 0454  CHOL 98  HDL 39*  LDLCALC 38   TRIG 105  CHOLHDL 2.5   Thyroid Function Tests: No results for input(s): "TSH", "T4TOTAL", "FREET4", "T3FREE", "THYROIDAB" in the last 72 hours. Anemia Panel: No results for input(s): "VITAMINB12", "FOLATE", "FERRITIN", "TIBC", "IRON", "RETICCTPCT" in the last 72 hours. Most Recent Urinalysis On File:     Component Value Date/Time   COLORURINE YELLOW (A) 10/25/2023 1802   APPEARANCEUR CLEAR (A) 10/25/2023 1802   APPEARANCEUR Clear 11/18/2011 1958   LABSPEC 1.023 10/25/2023 1802   LABSPEC 1.002 11/18/2011 1958   PHURINE 5.0 10/25/2023 1802   GLUCOSEU NEGATIVE 10/25/2023 1802   GLUCOSEU Negative 11/18/2011 1958   HGBUR NEGATIVE 10/25/2023 1802   BILIRUBINUR NEGATIVE 10/25/2023 1802   BILIRUBINUR Negative 11/18/2011 1958   KETONESUR 5 (A) 10/25/2023 1802   PROTEINUR NEGATIVE 10/25/2023 1802   NITRITE NEGATIVE 10/25/2023 1802   LEUKOCYTESUR NEGATIVE 10/25/2023 1802   LEUKOCYTESUR Negative 11/18/2011 1958   Sepsis Labs: @LABRCNTIP (procalcitonin:4,lacticidven:4) Microbiology: Recent Results (from the past 240 hours)  Resp panel by RT-PCR (RSV, Flu A&B, Covid) Anterior Nasal Swab     Status: None   Collection Time: 10/25/23  2:41 PM   Specimen: Anterior Nasal Swab  Result Value Ref Range Status   SARS Coronavirus 2 by RT PCR NEGATIVE NEGATIVE Final    Comment: (NOTE) SARS-CoV-2 target nucleic acids are NOT DETECTED.  The SARS-CoV-2 RNA is generally detectable in upper respiratory specimens during the acute phase of infection. The lowest concentration of SARS-CoV-2 viral copies this assay can detect is 138 copies/mL. A negative result does not preclude SARS-Cov-2 infection and should not be used as the sole basis for treatment or other patient management decisions. A negative result may occur with  improper specimen collection/handling, submission of specimen other than nasopharyngeal swab, presence of viral mutation(s) within the areas targeted by this assay, and inadequate  number of viral copies(<138 copies/mL). A negative result must be combined with clinical observations, patient history, and epidemiological information. The expected result is Negative.  Fact Sheet for Patients:  BloggerCourse.com  Fact Sheet for Healthcare Providers:  SeriousBroker.it  This test is no t yet approved or cleared by the [place] and  has been authorized for detection and/or diagnosis of SARS-CoV-2 by FDA under an Emergency Use Authorization (EUA). This EUA will remain  in effect (meaning this test can be used) for the duration of the COVID-19 declaration under Section 564(b)(1) of the Act, 21 U.S.C.section 360bbb-3(b)(1), unless the authorization is terminated  or revoked sooner.       Influenza A by PCR NEGATIVE NEGATIVE Final   Influenza B by PCR NEGATIVE NEGATIVE Final    Comment: (NOTE) The Xpert Xpress SARS-CoV-2/FLU/RSV plus assay is intended as an aid in the diagnosis of influenza from Nasopharyngeal swab specimens and should not be used as a sole basis for treatment. Nasal washings and aspirates are unacceptable for Xpert Xpress SARS-CoV-2/FLU/RSV testing.  Fact Sheet for Patients: BloggerCourse.com  Fact Sheet for Healthcare Providers: SeriousBroker.it  This test is not yet approved or cleared by the [place] FDA and has been authorized for detection and/or diagnosis of SARS-CoV-2 by FDA under an Emergency Use Authorization (EUA). This EUA will remain in effect (meaning this test can be used) for the duration of the COVID-19 declaration under Section 564(b)(1) of the Act, 21 U.S.C. section 360bbb-3(b)(1), unless the authorization is terminated or revoked.     Resp Syncytial Virus by PCR NEGATIVE NEGATIVE Final    Comment: (NOTE) Fact Sheet for Patients: BloggerCourse.com  Fact Sheet for Healthcare  Providers: SeriousBroker.it  This test is not yet approved or cleared by the [place] FDA and has been authorized for detection and/or diagnosis of SARS-CoV-2 by FDA under an Emergency Use Authorization (EUA). This EUA will remain in effect (meaning this test can be used) for the duration of the COVID-19 declaration under Section 564(b)(1) of the Act, 21 U.S.C. section 360bbb-3(b)(1), unless the authorization is terminated or revoked.  Performed at [facility], [address] [place], [place] [address]   MRSA Next Gen by PCR, Nasal     Status: None   Collection Time: 10/26/23 12:47 PM   Specimen: Nasal Mucosa; Nasal Swab  Result Value Ref Range Status   MRSA by PCR Next Gen NOT DETECTED NOT DETECTED Final    Comment: (NOTE) The GeneXpert MRSA Assay (FDA approved for NASAL specimens only), is one component of a comprehensive MRSA colonization surveillance program. It is not intended to diagnose MRSA infection nor to guide or monitor treatment for MRSA infections. Test performance is not FDA approved in patients less than 15 years old. Performed at [facility], [address] [place], [place] [address]       Radiology Studies last 3 days: CT PELVIS W CONTRAST Result Date: 10/27/2023 CLINICAL DATA:  Left leg pain and weakness. Primarily involves the thigh. Assess for left iliopsoas pathology. EXAM: CT PELVIS WITH CONTRAST TECHNIQUE: Multidetector CT imaging of the pelvis was performed using the standard protocol following the bolus administration of intravenous contrast. RADIATION DOSE REDUCTION: This exam was performed according to the departmental dose-optimization program which includes automated exposure control, adjustment of the mA and/or kV according to patient size and/or use of iterative reconstruction technique. CONTRAST:  OMNIPAQUE IOHEXOL 300 MG/ML  SOLN COMPARISON:  CT abdomen pelvis dated September 18, 2011.  FINDINGS: Urinary Tract:  No abnormality visualized. Bowel:  Small stool ball in the rectum.  Otherwise unremarkable. Vascular/Lymphatic: Kissing bilateral common iliac and external iliac stents are identified. There is significant in stent stenosis of the bilateral common iliac arteries with reduced flow, but no definite stent occlusion, although evaluation is limited by phase of contrast. No  enlarged pelvic lymph nodes. Reproductive:  Normal prostate gland. Other:  None. Musculoskeletal: No acute or significant findings. Specifically, the left iliopsoas muscles are unremarkable. IMPRESSION: 1. No acute abnormality. Specifically, the left iliopsoas muscles are unremarkable. 2. Kissing bilateral common iliac and external iliac stents are identified. There is significant in stent stenosis of the bilateral common iliac arteries with reduced flow, but no definite stent occlusion, although evaluation is limited by phase of contrast. If there is clinical concern for arterial thrombosis, recommend dedicated CTA of the abdomen and pelvis for further evaluation. Electronically Signed   By: Obie Dredge M.D.   On: 10/27/2023 12:48   DG HIP UNILAT WITH PELVIS 2-3 VIEWS LEFT Result Date: 10/26/2023 CLINICAL DATA:  Left hip pain after injury yesterday EXAM: DG HIP (WITH OR WITHOUT PELVIS) 2-3V LEFT COMPARISON:  None Available. FINDINGS: There is no evidence of hip fracture or dislocation. Mild osteophyte formation of the left hip is noted. 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 No acute abnormality seen. Electronically Signed   By: Lupita Raider M.D.   On: 10/26/2023 17:20   MR BRAIN WO CONTRAST Result Date: 10/25/2023 CLINICAL DATA:  Neuro deficit, acute, stroke suspected. Left leg weakness beginning yesterday. EXAM: MRI HEAD WITHOUT CONTRAST TECHNIQUE: Multiplanar, multiecho pulse sequences of the brain and surrounding structures were obtained without intravenous contrast. COMPARISON:  Head CT  same day.  MRI 03/22/2021 FINDINGS: Brain: Diffusion imaging does not show any acute or subacute infarction or other cause of restricted diffusion. Chronic small-vessel ischemic changes affect the pons. No focal cerebellar insult. There are numerous old small vessel infarctions within both thalami. Extensive chronic small-vessel ischemic changes present throughout the hemispheric white matter. No large vessel territory stroke. No mass, hemorrhage, hydrocephalus or extra-axial collection. Compared to the study of 2020, findings appear quite similar. Vascular: Major vessels at the base of the brain show flow. Skull and upper cervical spine: Negative Sinuses/Orbits: Mild seasonal mucosal thickening. No advanced sinus disease. Orbits negative. Other: None IMPRESSION: No acute finding by MRI. Extensive chronic small-vessel ischemic changes of the pons, thalami and hemispheric white matter. No change since 2020. Electronically Signed   By: Paulina Fusi M.D.   On: 10/25/2023 18:21   US Venous Img Lower Unilateral Left (DVT) Result Date: 10/25/2023 CLINICAL DATA:  Left lower extremity pain. EXAM: LEFT LOWER EXTREMITY VENOUS DOPPLER ULTRASOUND TECHNIQUE: Gray-scale sonography with graded compression, as well as color Doppler and duplex ultrasound were performed to evaluate the lower extremity deep venous systems from the level of the common femoral vein and including the common femoral, femoral, profunda femoral, popliteal and calf veins including the posterior tibial, peroneal and gastrocnemius veins when visible. The superficial great saphenous vein was also interrogated. Spectral Doppler was utilized to evaluate flow at rest and with distal augmentation maneuvers in the common femoral, femoral and popliteal veins. COMPARISON:  None Available. FINDINGS: Contralateral Common Femoral Vein: Respiratory phasicity is normal and symmetric with the symptomatic side. No evidence of thrombus. Normal compressibility. Common  Femoral Vein: No evidence of thrombus. Normal compressibility, respiratory phasicity and response to augmentation. Saphenofemoral Junction: No evidence of thrombus. Normal compressibility and flow on color Doppler imaging. Profunda Femoral Vein: No evidence of thrombus. Normal compressibility and flow on color Doppler imaging. Femoral Vein: No evidence of thrombus. Normal compressibility, respiratory phasicity and response to augmentation. Popliteal Vein: No evidence of thrombus. Normal compressibility, respiratory phasicity and response to augmentation. Calf Veins: No evidence of thrombus. Normal compressibility and flow  on color Doppler imaging. Superficial Great Saphenous Vein: No evidence of thrombus. Normal compressibility. Venous Reflux:  None. Other Findings: No evidence of superficial thrombophlebitis or abnormal fluid collection. IMPRESSION: No evidence of left lower extremity deep venous thrombosis. Electronically Signed   By: Irish Lack M.D.   On: 10/25/2023 17:03   DG Chest Port 1 View Result Date: 10/25/2023 CLINICAL DATA:  Shortness of breath with exertion.  Weakness. EXAM: PORTABLE CHEST 1 VIEW COMPARISON:  05/31/2023 FINDINGS: Heart size is normal. Chronic aortic atherosclerosis is noted. Chronic pulmonary scarring, most prominent in the left lower lobe. Findings appear similar to the study of September. Cannot rule out some coexistence left base pneumonia, but most if not all the findings in this area appear chronic. No pleural effusion. No acute bone finding. IMPRESSION: Chronic pulmonary scarring, most prominent in the left lower lobe. Cannot exclude some coexistent left base pneumonia, but most if not all the findings in this area appear chronic. Electronically Signed   By: Paulina Fusi M.D.   On: 10/25/2023 15:09   CT Head Wo Contrast Result Date: 10/25/2023 CLINICAL DATA:  Neuro deficit, acute, stroke suspected. Fell yesterday. Bilateral leg pain. EXAM: CT HEAD WITHOUT CONTRAST  TECHNIQUE: Contiguous axial images were obtained from the base of the skull through the vertex without intravenous contrast. RADIATION DOSE REDUCTION: This exam was performed according to the departmental dose-optimization program which includes automated exposure control, adjustment of the mA and/or kV according to patient size and/or use of iterative reconstruction technique. COMPARISON:  MRI 03/23/2019 FINDINGS: Brain: Generalized brain atrophy without subjective lobar predominance. Old small vessel infarction within the right pons. No focal cerebellar insult. Old infarction in both thalami and basal ganglia. Advanced chronic small-vessel ischemic changes of the white matter. No sign of acute infarction. No mass, hemorrhage, obstructive hydrocephalus or extra-axial collection. Ventricular size is in proportion to the degree of atrophy. Vascular: There is atherosclerotic calcification of the major vessels at the base of the brain. Skull: Negative Sinuses/Orbits: Clear/normal Other: None IMPRESSION: No acute CT finding. Atrophy and chronic small-vessel ischemic changes of the white matter. Old small vessel infarctions in the right pons, both thalami and basal ganglia. Electronically Signed   By: Paulina Fusi M.D.   On: 10/25/2023 15:08   MR LUMBAR SPINE WO CONTRAST Result Date: 10/25/2023 CLINICAL DATA:  Lumbar radiculopathy, increased fracture risk. EXAM: MRI LUMBAR SPINE WITHOUT CONTRAST TECHNIQUE: Multiplanar, multisequence MR imaging of the lumbar spine was performed. No intravenous contrast was administered. COMPARISON:  MRI of the lumbar spine November 07, 2008. FINDINGS: Segmentation:  Standard. Alignment:  Physiologic. Vertebrae: No fracture, evidence of discitis, or bone lesion. Schmorl node in the inferior endplate of L4. Conus medullaris and cauda equina: Conus extends to the T12-L1 level. Conus and cauda equina appear normal. Paraspinal and other soft tissues: Negative. Disc levels: T12-L1: No spinal  canal or neural foraminal stenosis. L1-2: Minimal disc bulge. No spinal canal or neural foraminal stenosis. L2-3: Shallow disc bulge and mild facet degenerative changes resulting in mild bilateral neural foraminal narrowing. No significant spinal canal stenosis. L3-4: Shallow disc bulge with superimposed left foraminal annular tear and mild facet degenerative changes. Mild left neural foraminal narrowing. No significant spinal canal stenosis. L4-5: Disc bulge, mild facet degenerative changes and ligamentum flavum redundancy resulting in mild bilateral neural foraminal narrowing. No significant spinal canal stenosis. L5-S1: Mild facet degenerative changes. No spinal canal or neural foraminal stenosis. IMPRESSION: 1. Mild degenerative changes of the lumbar spine with mild neural  foraminal narrowing at L2-L3, L3-L4 and L4-L5. 2. No high-grade spinal canal or neural foraminal stenosis at any level. Electronically Signed   By: Baldemar Lenis M.D.   On: 10/25/2023 13:02       Time spent: 50 min     Sunnie Nielsen, DO [facility] 10/27/2023, 4:45 PM    Dictation software may have been used to generate the above note. Typos may occur and escape review in typed/dictated notes. Please contact Dr Lyn Hollingshead directly for clarity if needed.  Staff may message me via secure chat in Epic  but this may not receive an immediate response,  please page me for urgent matters!  If 7PM-7AM, please contact night coverage [email or web address]

## 2023-10-27 NOTE — Progress Notes (Signed)
 Physical Therapy Treatment Patient Details Name: Donald [name]. MRN: [id number] DOB: Nov 14, 1960 Today's Date: 10/27/2023   History of Present Illness presented to ER secondary to fall at ALF with reports of L LE pain.  MRI of brain, L LE doppler and L-spine MRI negative for acute insult/injury.    PT Comments  Patient received in bed, he is pleasant, agrees to PT session. Patient reports he has been for MRIs today. He is mod I for bed mobility and transfers with supervision/cga. Ambulated ~50 feet with RW and cga. No lob, patient limited be reported L LE pain with ambulation. He will continue to benefit from skilled PT to improve mobility and independence.     If plan is discharge home, recommend the following: A little help with walking and/or transfers;A little help with bathing/dressing/bathroom   Can travel by private vehicle      yes  Equipment Recommendations  Rolling walker (2 wheels)    Recommendations for Other Services       Precautions / Restrictions Precautions Precautions: Fall Restrictions Weight Bearing Restrictions Per Provider Order: No     Mobility  Bed Mobility Overal bed mobility: Modified Independent Bed Mobility: Supine to Sit, Sit to Supine     Supine to sit: Modified independent (Device/Increase time) Sit to supine: Modified independent (Device/Increase time)   General bed mobility comments: no physical assist needed for bed mobility.    Transfers Overall transfer level: Needs assistance Equipment used: Rolling walker (2 wheels) Transfers: Sit to/from Stand Sit to Stand: Supervision                Ambulation/Gait Ambulation/Gait assistance: Contact guard assist Gait Distance (Feet): 45 Feet Assistive device: Rolling walker (2 wheels) Gait Pattern/deviations: Step-through pattern, Decreased step length - right, Decreased step length - left, Decreased stride length Gait velocity: decr     General Gait Details: less UE support  compared to yesterday's session. No lob, no buckling noted, just increased L LE pain with increased gait distance. Patient requesting to turn around after ~20 feet   Stairs             Wheelchair Mobility     Tilt Bed    Modified Rankin (Stroke Patients Only)       Balance Overall balance assessment: Modified Independent Sitting-balance support: Feet supported Sitting balance-Leahy Scale: Normal     Standing balance support: Bilateral upper extremity supported, During functional activity, Reliant on assistive device for balance Standing balance-Leahy Scale: Good                              Communication Communication Communication: No apparent difficulties  Cognition Arousal: Alert Behavior During Therapy: WFL for tasks assessed/performed   PT - Cognitive impairments: No apparent impairments                         Following commands: Intact      Cueing Cueing Techniques: Verbal cues  Exercises      General Comments        Pertinent Vitals/Pain Pain Assessment Pain Assessment: Faces Faces Pain Scale: Hurts little more Pain Location: LLE Pain Descriptors / Indicators: Discomfort, Sore Pain Intervention(s): Monitored during session, Repositioned    Home Living                          Prior Function  PT Goals (current goals can now be found in the care plan section) Acute Rehab PT Goals Patient Stated Goal: return home PT Goal Formulation: With patient Time For Goal Achievement: 11/09/23 Potential to Achieve Goals: Good Progress towards PT goals: Progressing toward goals    Frequency    Min 1X/week      PT Plan      Co-evaluation              AM-PAC PT "6 Clicks" Mobility   Outcome Measure  Help needed turning from your back to your side while in a flat bed without using bedrails?: None Help needed moving from lying on your back to sitting on the side of a flat bed without using  bedrails?: None Help needed moving to and from a bed to a chair (including a wheelchair)?: A Little Help needed standing up from a chair using your arms (e.g., wheelchair or bedside chair)?: A Little Help needed to walk in hospital room?: A Little Help needed climbing 3-5 steps with a railing? : A Little 6 Click Score: 20    End of Session   Activity Tolerance: Patient limited by pain Patient left: in bed;with call bell/phone within reach;with bed alarm set Nurse Communication: Mobility status PT Visit Diagnosis: Muscle weakness (generalized) (M62.81);Pain;Difficulty in walking, not elsewhere classified (R26.2) Pain - Right/Left: Left Pain - part of body: Leg     Time: 1439-1450 PT Time Calculation (min) (ACUTE ONLY): 11 min  Charges:    $Gait Training: 8-22 mins PT General Charges $$ ACUTE PT VISIT: 1 Visit                     Kemiah Booz, PT, GCS 10/27/23,2:54 PM

## 2023-10-27 NOTE — Plan of Care (Signed)
 Pt is alert and oriented, c/o pain to left leg.hip and feet. Pt received percocet 10 mg Q6hrs prn for pain and gabapentin for neuropathy. Pt get up with FWW with standby assist. Pt is from [facility] and POC is to return.  Problem: Education: Goal: Knowledge of disease or condition will improve Outcome: Progressing Goal: Knowledge of secondary prevention will improve (MUST DOCUMENT ALL) Outcome: Progressing Goal: Knowledge of patient specific risk factors will improve (DELETE if not current risk factor) Outcome: Progressing   Problem: Ischemic Stroke/TIA Tissue Perfusion: Goal: Complications of ischemic stroke/TIA will be minimized Outcome: Progressing   Problem: Coping: Goal: Will verbalize positive feelings about self Outcome: Progressing Goal: Will identify appropriate support needs Outcome: Progressing   Problem: Health Behavior/Discharge Planning: Goal: Ability to manage health-related needs will improve Outcome: Progressing Goal: Goals will be collaboratively established with patient/family Outcome: Progressing   Problem: Self-Care: Goal: Ability to participate in self-care as condition permits will improve Outcome: Progressing Goal: Verbalization of feelings and concerns over difficulty with self-care will improve Outcome: Progressing Goal: Ability to communicate needs accurately will improve Outcome: Progressing   Problem: Nutrition: Goal: Risk of aspiration will decrease Outcome: Progressing Goal: Dietary intake will improve Outcome: Progressing   Problem: Education: Goal: Knowledge of General Education information will improve Description: Including pain rating scale, medication(s)/side effects and non-pharmacologic comfort measures Outcome: Progressing   Problem: Health Behavior/Discharge Planning: Goal: Ability to manage health-related needs will improve Outcome: Progressing   Problem: Clinical Measurements: Goal: Ability to maintain  clinical measurements within normal limits will improve Outcome: Progressing Goal: Will remain free from infection Outcome: Progressing Goal: Diagnostic test results will improve Outcome: Progressing Goal: Respiratory complications will improve Outcome: Progressing Goal: Cardiovascular complication will be avoided Outcome: Progressing   Problem: Activity: Goal: Risk for activity intolerance will decrease Outcome: Progressing   Problem: Nutrition: Goal: Adequate nutrition will be maintained Outcome: Progressing   Problem: Coping: Goal: Level of anxiety will decrease Outcome: Progressing   Problem: Elimination: Goal: Will not experience complications related to bowel motility Outcome: Progressing Goal: Will not experience complications related to urinary retention Outcome: Progressing   Problem: Pain Managment: Goal: General experience of comfort will improve and/or be controlled Outcome: Progressing   Problem: Safety: Goal: Ability to remain free from injury will improve Outcome: Progressing   Problem: Skin Integrity: Goal: Risk for impaired skin integrity will decrease Outcome: Progressing

## 2023-10-28 DIAGNOSIS — Z8673 Personal history of transient ischemic attack (TIA), and cerebral infarction without residual deficits: Secondary | ICD-10-CM | POA: Diagnosis not present

## 2023-10-28 DIAGNOSIS — Z87891 Personal history of nicotine dependence: Secondary | ICD-10-CM

## 2023-10-28 DIAGNOSIS — S72145A Nondisplaced intertrochanteric fracture of left femur, initial encounter for closed fracture: Secondary | ICD-10-CM

## 2023-10-28 DIAGNOSIS — M79605 Pain in left leg: Secondary | ICD-10-CM

## 2023-10-28 DIAGNOSIS — Z79899 Other long term (current) drug therapy: Secondary | ICD-10-CM | POA: Diagnosis not present

## 2023-10-28 DIAGNOSIS — Z9889 Other specified postprocedural states: Secondary | ICD-10-CM

## 2023-10-28 DIAGNOSIS — Z95828 Presence of other vascular implants and grafts: Secondary | ICD-10-CM | POA: Diagnosis not present

## 2023-10-28 DIAGNOSIS — Z7901 Long term (current) use of anticoagulants: Secondary | ICD-10-CM

## 2023-10-28 DIAGNOSIS — R29898 Other symptoms and signs involving the musculoskeletal system: Secondary | ICD-10-CM | POA: Diagnosis not present

## 2023-10-28 DIAGNOSIS — G629 Polyneuropathy, unspecified: Secondary | ICD-10-CM

## 2023-10-28 LAB — BASIC METABOLIC PANEL
Anion gap: 7 (ref 5–15)
BUN: 25 mg/dL — ABNORMAL HIGH (ref 6–20)
CO2: 23 mmol/L (ref 22–32)
Calcium: 8.7 mg/dL — ABNORMAL LOW (ref 8.9–10.3)
Chloride: 108 mmol/L (ref 98–111)
Creatinine, Ser: 1.38 mg/dL — ABNORMAL HIGH (ref 0.61–1.24)
GFR, Estimated: 59 mL/min — ABNORMAL LOW (ref >60)
Glucose, Bld: 96 mg/dL (ref 70–99)
Potassium: 3.4 mmol/L — ABNORMAL LOW (ref 3.5–5.1)
Sodium: 138 mmol/L (ref 135–145)

## 2023-10-28 LAB — TYPE AND SCREEN
ABO/RH(D): A POS
Antibody Screen: NEGATIVE

## 2023-10-28 MED ORDER — CEFAZOLIN SODIUM-DEXTROSE 2-4 GM/100ML-% IV SOLN
2.0000 g | INTRAVENOUS | Status: AC
  Administered 2023-10-29: 2 g via INTRAVENOUS

## 2023-10-28 NOTE — Plan of Care (Signed)
  Problem: Education: Goal: Knowledge of disease or condition will improve Outcome: Progressing   Problem: Ischemic Stroke/TIA Tissue Perfusion: Goal: Complications of ischemic stroke/TIA will be minimized Outcome: Progressing   Problem: Health Behavior/Discharge Planning: Goal: Goals will be collaboratively established with patient/family Outcome: Progressing   Problem: Self-Care: Goal: Ability to communicate needs accurately will improve Outcome: Progressing

## 2023-10-28 NOTE — Plan of Care (Signed)
 MRI of left thigh reveals an acute nondisplaced left intertrochanteric femur fracture. Needs Orthopedics consult. Also with prominent edema in the proximal left adductor muscles and proximal vastus medialis muscle, likely strains/post-traumatic. No associated enhancement to suggest myositis.   CT of abdomen and pelvis reveals normal appearance of the left iliopsoas muscles. Kissing bilateral common iliac and external iliac stents are identified with significant in stent stenosis of the bilateral common iliac arteries with reduced flow, but no definite stent occlusion, although evaluation is limited by phase of contrast. If there is clinical concern for arterial thrombosis, recommend dedicated CTA of the abdomen and pelvis for further evaluation. May need Vascular Surgery to weigh in, but I think that may be able to be done outpatient. .   Neurohospitalist service will sign off.   Electronically signed: Dr. Caryl Pina

## 2023-10-28 NOTE — Consult Note (Signed)
 ORTHOPAEDIC CONSULTATION  REQUESTING PHYSICIAN: Sunnie Nielsen, DO  Chief Complaint:   L hip pain  History of Present Illness: Donald Cabello. is a 63 y.o. male w/PMHx of hyperlipidemia, hypertension, neuropathy, BPH, PAD, CKD 3A, moderate COPD, cigarette/tobacco user, history of CVA (8 prior per patient) and anticoagulated on Eliquis who felt his LLE give way, causing a fall on 10/24/23. He noted immediate hip pain and difficulty ambulating. He ambulated unassisted and lives at an assisted living facility.  He presented to the ED and radiographs and CT scan were negative for fracture. Neurology team was consulted for further evaluation of weakness. MRI was recommended. This showed a non-displaced L intertrochanteric femur fracture.   Past Medical History:  Diagnosis Date   Depression    Emphysema of lung (HCC)    Essential hypertension    History of chicken pox    Hyponatremia    Insomnia    Low weight    MRSA nasal colonization    Neuropathy    bilateral lower legs   Peripheral vascular disease (HCC)    Pneumonia    Prostate disease    Seasonal allergies    Seizures (HCC)    Sensory ataxia    Sepsis (HCC)    Stroke (HCC)    Wears dentures    full upper   Past Surgical History:  Procedure Laterality Date   ABDOMINAL AORTIC ENDOVASCULAR STENT GRAFT Bilateral 03/16/2019   Procedure: AORTIC ILIAC STENT;  Surgeon: Annice Needy, MD;  Location: [facility];  Service: Vascular;  Laterality: Bilateral;   AMPUTATION TOE Right 06/16/2019   Procedure: 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;  Surgeon: Gwyneth Revels, DPM;  Location: [facility];  Service: Podiatry;  Laterality: Right;   CATARACT EXTRACTION W/PHACO Left 05/18/2018   Procedure: CATARACT EXTRACTION PHACO AND INTRAOCULAR LENS PLACEMENT (IOC) LEFT;  Surgeon: Lockie Mola, MD;  Location: [facility];  Service: Ophthalmology;  Laterality: Left;   COLONOSCOPY WITH  PROPOFOL N/A 02/25/2016   Procedure: COLONOSCOPY WITH PROPOFOL;  Surgeon: Midge Minium, MD;  Location: [facility];  Service: Endoscopy;  Laterality: N/A;   ENDARTERECTOMY FEMORAL Bilateral 03/16/2019   Procedure: ENDARTERECTOMY FEMORAL;  Surgeon: Annice Needy, MD;  Location: [facility];  Service: Vascular;  Laterality: Bilateral;   EYE SURGERY  2015   Cataract   FRACTURE SURGERY     ORIF wrist   LOWER EXTREMITY ANGIOGRAPHY Left 03/13/2019   Procedure: Lower Extremity Angiography (LEFT);  Surgeon: Annice Needy, MD;  Location: [facility];  Service: Cardiovascular;  Laterality: Left;   VASCULAR SURGERY     "7 stents in legs"   Social History   Socioeconomic History   Marital status: Single    Spouse name: Not on file   Number of children: Not on file   Years of education: Not on file   Highest education level: Not on file  Occupational History   Not on file  Tobacco Use   Smoking status: Former    Current packs/day: 0.50    Average packs/day: 0.5 packs/day for 40.8 years (20.4 ttl pk-yrs)    Types: Cigarettes    Start date: 01/26/1983   Smokeless tobacco: Former    Quit date: 08/31/2016   Tobacco comments:    Previously quit in 09/2016 on Chantix until 09/2017  Vaping Use   Vaping status: Never Used  Substance and Sexual Activity   Alcohol use: Not Currently    Alcohol/week: 0.0 standard drinks of alcohol    Comment:  occ   Drug use: No   Sexual activity: Not on file  Other Topics Concern   Not on file  Social History Narrative   Not on file   Social Drivers of Health   Financial Resource Strain: Low Risk  (10/12/2023)   Received from [facility]   Overall Financial Resource Strain (CARDIA)    Difficulty of Paying Living Expenses: Not hard at all  Food Insecurity: No Food Insecurity (10/26/2023)   Hunger Vital Sign    Worried About Running Out of Food in the Last Year: Never true    Ran Out of Food in the Last Year: Never true  Transportation  Needs: No Transportation Needs (10/26/2023)   PRAPARE - Administrator, Civil Service (Medical): No    Lack of Transportation (Non-Medical): No  Physical Activity: Unknown (03/13/2019)   Exercise Vital Sign    Days of Exercise per Week: 0 days    Minutes of Exercise per Session: Not on file  Stress: Not on file  Social Connections: Unknown (10/26/2023)   Social Connection and Isolation Panel [NHANES]    Frequency of Communication with Friends and Family: Twice a week    Frequency of Social Gatherings with Friends and Family: Never    Attends Religious Services: Never    Database administrator or Organizations: No    Attends Engineer, structural: Never    Marital Status: Patient declined   Family History  Problem Relation Age of Onset   Ulcers Father        bleeding   Cancer Mother        Breast   Allergies  Allergen Reactions   Lidocaine Other (See Comments)    unknown   Prior to Admission medications   Medication Sig Start Date End Date Taking? Authorizing Provider  acetaminophen (TYLENOL) 500 MG tablet Take 500 mg by mouth every 6 (six) hours as needed for moderate pain.    Yes [provider]  albuterol (VENTOLIN HFA) 108 (90 Base) MCG/ACT inhaler Inhale 2 puffs into the lungs every 6 (six) hours as needed for wheezing or shortness of breath. 02/15/19  Yes Karamalegos, Netta Neat, DO  amitriptyline (ELAVIL) 10 MG tablet Take 10 mg by mouth at bedtime. 09/15/22  Yes [provider]  atorvastatin (LIPITOR) 10 MG tablet Take 10 mg by mouth daily. 10/09/23  Yes [provider]  Carboxymeth-Glycerin-Polysorb (REFRESH DIGITAL) 0.5-1-0.5 % SOLN Apply to eye.   Yes [provider]  diltiazem (CARDIZEM CD) 240 MG 24 hr capsule Take 1 capsule (240 mg total) by mouth daily. 03/25/19  Yes Gouru, Deanna Artis, MD  docusate sodium (COLACE) 100 MG capsule Take 100 mg by mouth daily.   Yes [provider]  ELIQUIS 2.5 MG TABS tablet Take 2.5  mg by mouth 2 (two) times daily. 09/15/22  Yes [provider]  ferrous sulfate 325 (65 FE) MG EC tablet Take 325 mg by mouth daily at 6 (six) AM.   Yes [provider]  finasteride (PROSCAR) 5 MG tablet Take 1 tablet (5 mg total) by mouth daily. 10/19/23  Yes Vanna Scotland, MD  gabapentin (NEURONTIN) 100 MG capsule Take 100 mg by mouth 3 (three) times daily. Take in addition to one 400 mg capsule for total 500 mg three times daily 10/09/23  Yes [provider]  gabapentin (NEURONTIN) 400 MG capsule Take 400 mg by mouth 3 (three) times daily. Take in addition to one 100 mg capsule  for total 500 mg three times daily   Yes [provider]  hydrALAZINE (APRESOLINE) 50 MG tablet Take 50 mg by mouth in the morning, at noon, in the evening, and at bedtime. 09/15/22  Yes [provider]  losartan (COZAAR) 25 MG tablet Take 25 mg by mouth daily.   Yes [provider]  magnesium oxide (MAG-OX) 400 (240 Mg) MG tablet Take 400 mg by mouth daily.   Yes [provider]  melatonin 5 MG TABS Take 10 mg by mouth at bedtime.   Yes [provider]  methocarbamol (ROBAXIN) 500 MG tablet Take 500 mg by mouth every 8 (eight) hours as needed for muscle spasms.   Yes [provider]  metoprolol succinate (TOPROL-XL) 50 MG 24 hr tablet Take 2 tablets (100 mg total) by mouth daily. Patient taking differently: Take 50 mg by mouth daily. 03/24/19  Yes Gouru, Deanna Artis, MD  Multiple Vitamins-Minerals (MULTIVITAMIN WITH MINERALS) tablet Take 1 tablet by mouth daily.   Yes [provider]  naproxen (NAPROSYN) 500 MG tablet Take 500 mg by mouth 2 (two) times daily. 09/23/23  Yes [provider]  oxyCODONE-acetaminophen (PERCOCET) 10-325 MG tablet Take 1 tablet by mouth every 6 (six) hours as needed.   Yes [provider]  tamsulosin (FLOMAX) 0.4 MG CAPS capsule Take 1 capsule (0.4 mg total) by mouth daily after supper. Patient taking  differently: Take 0.4 mg by mouth 2 (two) times daily. 01/12/19  Yes Karamalegos, Netta Neat, DO  Tiotropium Bromide Monohydrate (SPIRIVA RESPIMAT) 2.5 MCG/ACT AERS Inhale 2 puffs into the lungs daily.   Yes [provider]  traZODone (DESYREL) 50 MG tablet Take 50 mg by mouth at bedtime. 09/15/22  Yes [provider]  vitamin C (VITAMIN C) 250 MG tablet Take 1 tablet (250 mg total) by mouth 2 (two) times daily. 03/24/19  Yes Gouru, Deanna Artis, MD  apixaban (ELIQUIS) 5 MG TABS tablet Take 1 tablet (5 mg total) by mouth 2 (two) times daily. Patient not taking: Reported on 10/25/2023 03/24/19   Ramonita Lab, MD  cholecalciferol (VITAMIN D) 25 MCG tablet Take 1 tablet (1,000 Units total) by mouth daily. Patient not taking: Reported on 10/26/2023 03/24/19   Ramonita Lab, MD  lidocaine (LIDODERM) 5 % Place 1 patch onto the skin every 12 (twelve) hours. Remove & Discard patch within 12 hours or as directed by MD Patient not taking: Reported on 10/26/2023 05/31/23 05/30/24  Delton Prairie, MD  [facility] HANDIHALER 18 MCG inhalation capsule 1 capsule daily. Patient not taking: Reported on 10/26/2023    [provider]   Recent Labs    10/26/23 0454 10/27/23 0521 10/28/23 0452  WBC 7.4  --   --   HGB 10.3*  --   --   HCT 30.9*  --   --   PLT 155  --   --   K 3.9 3.9 3.4*  CL 105 106 108  CO2 18* 19* 23  BUN 21* 26* 25*  CREATININE 1.39* 1.41* 1.38*  GLUCOSE 67* 61* 96  CALCIUM 8.6* 8.7* 8.7*   MR FEMUR LEFT W WO CONTRAST Result Date: 10/27/2023 CLINICAL DATA:  Left leg pain and weakness primarily involving the thigh. Fell yesterday. Assess for myopathy. EXAM: MR OF THE LEFT LOWER EXTREMITY WITHOUT AND WITH CONTRAST TECHNIQUE: Multiplanar, multisequence MR imaging of the left thigh was performed both before and after administration of intravenous contrast. CONTRAST:  5mL GADAVIST GADOBUTROL 1 MMOL/ML IV SOLN COMPARISON:  None Available.  FINDINGS: Bones/Joint/Cartilage Acute nondisplaced  left intertrochanteric femur fracture. No dislocation. Joint spaces are preserved. Small left hip joint effusion. Muscles and Tendons Prominent edema in the proximal left adductor muscles and proximal vastus medialis muscle. No associated enhancement. No significant muscle atrophy. Soft tissue No fluid collection or hematoma.  No soft tissue mass. IMPRESSION: 1. Acute nondisplaced left intertrochanteric femur fracture. 2. Prominent edema in the proximal left adductor muscles and proximal vastus medialis muscle, likely strains/post-traumatic. No associated enhancement to suggest myositis. Electronically Signed   By: Obie Dredge M.D.   On: 10/27/2023 16:57   CT PELVIS W CONTRAST Result Date: 10/27/2023 CLINICAL DATA:  Left leg pain and weakness. Primarily involves the thigh. Assess for left iliopsoas pathology. EXAM: CT PELVIS WITH CONTRAST TECHNIQUE: Multidetector CT imaging of the pelvis was performed using the standard protocol following the bolus administration of intravenous contrast. RADIATION DOSE REDUCTION: This exam was performed according to the departmental dose-optimization program which includes automated exposure control, adjustment of the mA and/or kV according to patient size and/or use of iterative reconstruction technique. CONTRAST:  OMNIPAQUE IOHEXOL 300 MG/ML  SOLN COMPARISON:  CT abdomen pelvis dated September 18, 2011. FINDINGS: Urinary Tract:  No abnormality visualized. Bowel:  Small stool ball in the rectum.  Otherwise unremarkable. Vascular/Lymphatic: Kissing bilateral common iliac and external iliac stents are identified. There is significant in stent stenosis of the bilateral common iliac arteries with reduced flow, but no definite stent occlusion, although evaluation is limited by phase of contrast. No enlarged pelvic lymph nodes. Reproductive:  Normal prostate gland. Other:  None. Musculoskeletal: No acute or significant findings. Specifically, the left iliopsoas muscles are  unremarkable. IMPRESSION: 1. No acute abnormality. Specifically, the left iliopsoas muscles are unremarkable. 2. Kissing bilateral common iliac and external iliac stents are identified. There is significant in stent stenosis of the bilateral common iliac arteries with reduced flow, but no definite stent occlusion, although evaluation is limited by phase of contrast. If there is clinical concern for arterial thrombosis, recommend dedicated CTA of the abdomen and pelvis for further evaluation. Electronically Signed   By: Obie Dredge M.D.   On: 10/27/2023 12:48     Positive ROS: All other systems have been reviewed and were otherwise negative with the exception of those mentioned in the HPI and as above.  Physical Exam: BP 134/81 (BP Location: Right Arm)   Pulse 85   Temp 98.1 F (36.7 C)   Resp 18   Ht 5\' 8"  (1.727 m)   Wt 48.5 kg   SpO2 96%   BMI 16.27 kg/m  General:  Alert, no acute distress Psychiatric:  Patient is competent for consent with normal mood and affect    Orthopedic Exam:  LLE: + DF/PF/EHL SILT grossly over foot Foot wwp +Log roll/axial load   Imaging:  As above: L non-displaced intertrochanteric hip fracture  Assessment/Plan: Donald Russian. is a 63 y.o. male with a L non-displaced intertrochanteric hip fracture 1. I discussed the various treatment options including both surgical and non-surgical management of the fracture with the patient. We discussed the high risk of perioperative complications due to patient's age and other co-morbidities. After discussion of risks, benefits, and alternatives to surgery, the patient was in agreement to proceed with surgery.The goals of surgery would be to provide adequate pain relief and allow for improved mobilization. Plan for surgery is L hip cephalomedullary nailing tomorrow, 10/29/23 2. NPO after midnight 3. Hold anticoagulation in advance of OR  Signa Kell   10/28/2023 9:15 PM

## 2023-10-28 NOTE — Progress Notes (Addendum)
 Full consult note and discussion with patient to follow tomorrow.  I was notified this AM by primary team regarding MRI finding of L non-displaced intertrochanteric femur fracture. Imaging reviewed. This would likely explain patient's current symptoms. - Plan for surgery tomorrow with L hip IM nail, likely late morning/early afternoon.  - NPO after midnight. - Hold anticoagulation. Eliquis order discontinued. - Hold PT, NWB on LLE. - Please medically optimize as needed.

## 2023-10-28 NOTE — Progress Notes (Signed)
 OT Cancellation Note  Patient Details Name: Donald [name]. MRN: [id number] DOB: 1961-01-14   Cancelled Treatment:    Reason Eval/Treat Not Completed: Patient not medically ready. Pt now pending surgical repair of L nondisplaced hip fracture. Will complete current orders and await new OT consult s/p surgery.   Arman Filter., MPH, MS, OTR/L ascom [phone] 10/28/23, 10:51 AM

## 2023-10-28 NOTE — Progress Notes (Signed)
 PT Cancellation Note  Patient Details Name: Donald [name]. MRN: [id number] DOB: [birth year]   Cancelled Treatment:    Reason Eval/Treat Not Completed: Medical issues which prohibited therapy. Per EMR, pt requiring surgery for L hip fracture. PT to close current orders. Please re-consult as medically appropriate post surgery.    Delphia Grates. Fairly IV, PT, DPT Physical Therapist- [place]  [facility]  10/28/2023, 10:47 AM

## 2023-10-28 NOTE — Progress Notes (Signed)
 PROGRESS NOTE    Donald [name].   IEP:[id number] DOB: 03-11-1961  DOA: 10/25/2023 Date of Service: 10/28/23 which is hospital day 2  PCP: Donald Lambert, NP    Hospital course / significant events:   HPI:  [name], is a 63 year old male with history of hyperlipidemia, hypertension, neuropathy, BPH, PAD, CKD 3A, moderate COPD, cigarette/tobacco user, history of CVA, insomnia, alcohol dependence with history of delirium tremens, who presents to the emergency department for chief concerns of left leg weakness. He reports that at baseline he ambulates without a walker. He endorses that he does live at assisted living due to having had 8 strokes in the past. He reports that yesterday, he fell because he was opening the door to his room when he suddenly felt left leg weakness, his left leg giving out on him and he fell. He denies loss of consciousness. He states that the weakness of his LLE primarily involves his thigh, with difficulty flexing his leg at the hip, but also with significant weakness of leg extension. He denies sensory loss in either of his lower extremities.   02/17: admitted to hospitalist service w/ concern for TIA/CVA. MRI brain showed nothing acute, (+)extensive chronic small vessel eischemic changes. LLE [place] Neg CVT. MRI L-spine (+)degenerative changes w/o high grade canal/foraminal stenosis  02/18: XR hip (+)degenerative joint disease no fracture/dislocation, Neurology saw pt - "Exam reveals focal weakness of his LLE, significantly worse proximally with 2-3/5 hip flexion, 3/5 hip adduction, 4-/5 knee extension and flexion, 4/5 ADF and APF." "DDx for focal proximal > distal LLE weakness... Includes lumbosacral plexopathy, focal myopathy, iliopsoas focal lesion such as a hematoma or mass, and focal ischemic nerve injury of the femoral nerve... Per literature search, intermittent claudication is one cause of hip and thigh pain with weakness of hip flexion." Recs for MRI L  thigh to assess for myopathy, which was ordered but not done this day. CT pelvis advised. CK level not collected. Hold statin. Consider vascular surgery consult/followup.  02/19: MRI L thigh/femur pending read. CT no iliopsoas abn but is showing some occlusive disease in bilateral common iliac and external iliac stents - I've asked vascular to weigh in on whether or to what extent this might be causing symptoms.  02/20: MRI (+)L femur fx, ortho to take to OR tomorrow. Vacsular saw pt, no concerns.      Consultants:  Neurology   Procedures/Surgeries: none      ASSESSMENT & PLAN:   Acute nondisplaced left intertrochanteric femur fracture.  Potential for LLR weakness (see below) led to fall and still need w/u Ortho following To OR tomorrow for pin PT/OT to follow after surgery / per ortho  Left leg weakness Have reasonably ruled out CVA, lumbar disc disease / nerve compression Ddx Includes lumbosacral plexopathy, focal myopathy, iliopsoas focal lesion such as a hematoma or mass, focal ischemic nerve injury of the femoral nerve, intermittent claudication  Neurology has s/o  Follow CK levels  We were holding statin but can probably restart now  Consider outpatient Neurology referral for EMG/NCS    PAD Normal ABIs last month and palpable distal pulses in left leg, do not think left leg dysfunction is 2/2 arterial disease Restart statin  holding apixaban pending ortho to OR  Vascular consult to comment on posisbility claudication may be causing weakness --> no intervention, follow routinely outpatient, suspect neuropathy more a contributor vs any claudication problem   History CVA MRI brain neg for acute stroke  holding statin as above holding apixaban pending ortho to OR    HTN Here bp mild elevation, also tachycardic in setting of holding home dilt and metop resume home metop and dilt   CKD 3a Kidney function is at baseline. Bicarb low today from normal yesterday repeat  bmp, may need further eval if bicarb remains low   Insomnia home amitryptyline   BPH home proscar, flomax   COPD Quiescent incruse for home spiriva   Neuropathy home gabapentin   GAD home lora  History alcoholism Reports last drink 5 years ago No intervention     underweight based on BMI: Body mass index is 16.27 kg/m.  Underweight - under 18  overweight - 25 to 29 obese - 30 or more Class 1 obesity: BMI of 30.0 to 34 Class 2 obesity: BMI of 35.0 to 39 Class 3 obesity: BMI of 40.0 to 49 Super Morbid Obesity: BMI 50-59 Super-super Morbid Obesity: BMI 60+ Significantly low or high BMI is associated with higher medical risk.  Weight management advised as adjunct to other disease management and risk reduction treatments    DVT prophylaxis: eliquis  IV fluids: no continuous IV fluids  Nutrition: cardiac diet Central lines / invasive devices: none  Code Status: DNR ACP documentation reviewed:  none on file in [facility]  TOC needs: home health PT/OT Barriers to dispo / significant pending items: likely for surgery tomorrow w/ Ortho              Subjective / Brief ROS:  Patient reports weakness is about the same Denies CP/SOB.  Pain controlled.  Denies new weakness.  Tolerating diet.  Reports no concerns w/ urination/defecation.   Family Communication: none at this time - offered tocall support person and pt declined today     Objective Findings:  Vitals:   10/27/23 0807 10/27/23 0810 10/27/23 2023 10/28/23 0427  BP: 137/75 137/75 131/81 132/77  Pulse: 79 79 95 89  Resp: 18   17  Temp:   98.5 F (36.9 C) 98.5 F (36.9 C)  TempSrc:    Oral  SpO2: 98%  96% 96%  Weight:      Height:        Intake/Output Summary (Last 24 hours) at 10/28/2023 1215 Last data filed at 10/28/2023 1041 Gross per 24 hour  Intake 480 ml  Output --  Net 480 ml   Filed Weights   10/25/23 0605  Weight: 48.5 kg    Examination:  Physical Exam Constitutional:       General: He is not in acute distress. Cardiovascular:     Rate and Rhythm: Normal rate and regular rhythm.  Pulmonary:     Effort: Pulmonary effort is normal.     Breath sounds: Normal breath sounds.  Abdominal:     Palpations: Abdomen is soft.  Musculoskeletal:     Right lower leg: No edema.     Left lower leg: No edema.  Neurological:     Mental Status: He is alert and oriented to person, place, and time. Mental status is at baseline.  Psychiatric:        Mood and Affect: Mood normal.        Behavior: Behavior normal.          Scheduled Medications:    stroke: early stages of recovery book   Does not apply Once   amitriptyline  10 mg Oral QHS   ascorbic acid  250 mg Oral BID   diltiazem  180 mg Oral  Daily   finasteride  5 mg Oral Daily   gabapentin  500 mg Oral TID   metoprolol succinate  50 mg Oral Daily   tamsulosin  0.4 mg Oral BID AC   traZODone  50 mg Oral QHS   umeclidinium bromide  1 puff Inhalation Daily    Continuous Infusions:  [START ON 10/29/2023]  ceFAZolin (ANCEF) IV      PRN Medications:  acetaminophen **OR** acetaminophen (TYLENOL) oral liquid 160 mg/5 mL **OR** acetaminophen, albuterol, lidocaine, nicotine, oxyCODONE-acetaminophen, senna-docusate  Antimicrobials from admission:  Anti-infectives (From admission, onward)    Start     Dose/Rate Route Frequency Ordered Stop   10/29/23 1200  ceFAZolin (ANCEF) IVPB 2g/100 mL premix        2 g 200 mL/hr over 30 Minutes Intravenous 30 min pre-op 10/28/23 0935             Data Reviewed:  I have personally reviewed the following...  CBC: Recent Labs  Lab 10/25/23 0607 10/26/23 0454  WBC 9.5 7.4  HGB 11.2* 10.3*  HCT 33.5* 30.9*  MCV 95.7 97.2  PLT 193 155   Basic Metabolic Panel: Recent Labs  Lab 10/25/23 0607 10/26/23 0454 10/27/23 0521 10/28/23 0452  NA 137 135 136 138  K 3.9 3.9 3.9 3.4*  CL 106 105 106 108  CO2 24 18* 19* 23  GLUCOSE 100* 67* 61* 96  BUN 19 21* 26*  25*  CREATININE 1.51* 1.39* 1.41* 1.38*  CALCIUM 9.3 8.6* 8.7* 8.7*   GFR: Estimated Creatinine Clearance: 39 mL/min (A) (by C-G formula based on SCr of 1.38 mg/dL (H)). Liver Function Tests: No results for input(s): "AST", "ALT", "ALKPHOS", "BILITOT", "PROT", "ALBUMIN" in the last 168 hours. No results for input(s): "LIPASE", "AMYLASE" in the last 168 hours. No results for input(s): "AMMONIA" in the last 168 hours. Coagulation Profile: No results for input(s): "INR", "PROTIME" in the last 168 hours. Cardiac Enzymes: Recent Labs  Lab 10/27/23 0521  CKTOTAL 44*   BNP (last 3 results) No results for input(s): "PROBNP" in the last 8760 hours. HbA1C: Recent Labs    10/26/23 0454  HGBA1C 4.6*   CBG: No results for input(s): "GLUCAP" in the last 168 hours. Lipid Profile: Recent Labs    10/26/23 0454  CHOL 98  HDL 39*  LDLCALC 38  TRIG 784  CHOLHDL 2.5   Thyroid Function Tests: No results for input(s): "TSH", "T4TOTAL", "FREET4", "T3FREE", "THYROIDAB" in the last 72 hours. Anemia Panel: No results for input(s): "VITAMINB12", "FOLATE", "FERRITIN", "TIBC", "IRON", "RETICCTPCT" in the last 72 hours. Most Recent Urinalysis On File:     Component Value Date/Time   COLORURINE YELLOW (A) 10/25/2023 1802   APPEARANCEUR CLEAR (A) 10/25/2023 1802   APPEARANCEUR Clear 11/18/2011 1958   LABSPEC 1.023 10/25/2023 1802   LABSPEC 1.002 11/18/2011 1958   PHURINE 5.0 10/25/2023 1802   GLUCOSEU NEGATIVE 10/25/2023 1802   GLUCOSEU Negative 11/18/2011 1958   HGBUR NEGATIVE 10/25/2023 1802   BILIRUBINUR NEGATIVE 10/25/2023 1802   BILIRUBINUR Negative 11/18/2011 1958   KETONESUR 5 (A) 10/25/2023 1802   PROTEINUR NEGATIVE 10/25/2023 1802   NITRITE NEGATIVE 10/25/2023 1802   LEUKOCYTESUR NEGATIVE 10/25/2023 1802   LEUKOCYTESUR Negative 11/18/2011 1958   Sepsis Labs: @LABRCNTIP (procalcitonin:4,lacticidven:4) Microbiology: Recent Results (from the past 240 hours)  Resp panel by RT-PCR  (RSV, Flu A&B, Covid) Anterior Nasal Swab     Status: None   Collection Time: 10/25/23  2:41 PM   Specimen: Anterior Nasal Swab  Result  Value Ref Range Status   SARS Coronavirus 2 by RT PCR NEGATIVE NEGATIVE Final    Comment: (NOTE) SARS-CoV-2 target nucleic acids are NOT DETECTED.  The SARS-CoV-2 RNA is generally detectable in upper respiratory specimens during the acute phase of infection. The lowest concentration of SARS-CoV-2 viral copies this assay can detect is 138 copies/mL. A negative result does not preclude SARS-Cov-2 infection and should not be used as the sole basis for treatment or other patient management decisions. A negative result may occur with  improper specimen collection/handling, submission of specimen other than nasopharyngeal swab, presence of viral mutation(s) within the areas targeted by this assay, and inadequate number of viral copies(<138 copies/mL). A negative result must be combined with clinical observations, patient history, and epidemiological information. The expected result is Negative.  Fact Sheet for Patients:  BloggerCourse.com  Fact Sheet for Healthcare Providers:  SeriousBroker.it  This test is no t yet approved or cleared by the [place] FDA and  has been authorized for detection and/or diagnosis of SARS-CoV-2 by FDA under an Emergency Use Authorization (EUA). This EUA will remain  in effect (meaning this test can be used) for the duration of the COVID-19 declaration under Section 564(b)(1) of the Act, 21 U.S.C.section 360bbb-3(b)(1), unless the authorization is terminated  or revoked sooner.       Influenza A by PCR NEGATIVE NEGATIVE Final   Influenza B by PCR NEGATIVE NEGATIVE Final    Comment: (NOTE) The Xpert Xpress SARS-CoV-2/FLU/RSV plus assay is intended as an aid in the diagnosis of influenza from Nasopharyngeal swab specimens and should not be used as a sole basis for  treatment. Nasal washings and aspirates are unacceptable for Xpert Xpress SARS-CoV-2/FLU/RSV testing.  Fact Sheet for Patients: BloggerCourse.com  Fact Sheet for Healthcare Providers: SeriousBroker.it  This test is not yet approved or cleared by the [place] FDA and has been authorized for detection and/or diagnosis of SARS-CoV-2 by FDA under an Emergency Use Authorization (EUA). This EUA will remain in effect (meaning this test can be used) for the duration of the COVID-19 declaration under Section 564(b)(1) of the Act, 21 U.S.C. section 360bbb-3(b)(1), unless the authorization is terminated or revoked.     Resp Syncytial Virus by PCR NEGATIVE NEGATIVE Final    Comment: (NOTE) Fact Sheet for Patients: BloggerCourse.com  Fact Sheet for Healthcare Providers: SeriousBroker.it  This test is not yet approved or cleared by the [place] FDA and has been authorized for detection and/or diagnosis of SARS-CoV-2 by FDA under an Emergency Use Authorization (EUA). This EUA will remain in effect (meaning this test can be used) for the duration of the COVID-19 declaration under Section 564(b)(1) of the Act, 21 U.S.C. section 360bbb-3(b)(1), unless the authorization is terminated or revoked.  Performed at [facility], [address] [place], [place] [address]   MRSA Next Gen by PCR, Nasal     Status: None   Collection Time: 10/26/23 12:47 PM   Specimen: Nasal Mucosa; Nasal Swab  Result Value Ref Range Status   MRSA by PCR Next Gen NOT DETECTED NOT DETECTED Final    Comment: (NOTE) The GeneXpert MRSA Assay (FDA approved for NASAL specimens only), is one component of a comprehensive MRSA colonization surveillance program. It is not intended to diagnose MRSA infection nor to guide or monitor treatment for MRSA infections. Test performance is not FDA approved in  patients less than 9 years old. Performed at [facility], [address], [place], [place] [address]  Radiology Studies last 3 days: MR FEMUR LEFT W WO CONTRAST Result Date: 10/27/2023 CLINICAL DATA:  Left leg pain and weakness primarily involving the thigh. Fell yesterday. Assess for myopathy. EXAM: MR OF THE LEFT LOWER EXTREMITY WITHOUT AND WITH CONTRAST TECHNIQUE: Multiplanar, multisequence MR imaging of the left thigh was performed both before and after administration of intravenous contrast. CONTRAST:  5mL GADAVIST GADOBUTROL 1 MMOL/ML IV SOLN COMPARISON:  None Available. FINDINGS: Bones/Joint/Cartilage Acute nondisplaced left intertrochanteric femur fracture. No dislocation. Joint spaces are preserved. Small left hip joint effusion. Muscles and Tendons Prominent edema in the proximal left adductor muscles and proximal vastus medialis muscle. No associated enhancement. No significant muscle atrophy. Soft tissue No fluid collection or hematoma.  No soft tissue mass. IMPRESSION: 1. Acute nondisplaced left intertrochanteric femur fracture. 2. Prominent edema in the proximal left adductor muscles and proximal vastus medialis muscle, likely strains/post-traumatic. No associated enhancement to suggest myositis. Electronically Signed   By: Obie Dredge M.D.   On: 10/27/2023 16:57   CT PELVIS W CONTRAST Result Date: 10/27/2023 CLINICAL DATA:  Left leg pain and weakness. Primarily involves the thigh. Assess for left iliopsoas pathology. EXAM: CT PELVIS WITH CONTRAST TECHNIQUE: Multidetector CT imaging of the pelvis was performed using the standard protocol following the bolus administration of intravenous contrast. RADIATION DOSE REDUCTION: This exam was performed according to the departmental dose-optimization program which includes automated exposure control, adjustment of the mA and/or kV according to patient size and/or use of iterative reconstruction technique. CONTRAST:   OMNIPAQUE IOHEXOL 300 MG/ML  SOLN COMPARISON:  CT abdomen pelvis dated September 18, 2011. FINDINGS: Urinary Tract:  No abnormality visualized. Bowel:  Small stool ball in the rectum.  Otherwise unremarkable. Vascular/Lymphatic: Kissing bilateral common iliac and external iliac stents are identified. There is significant in stent stenosis of the bilateral common iliac arteries with reduced flow, but no definite stent occlusion, although evaluation is limited by phase of contrast. No enlarged pelvic lymph nodes. Reproductive:  Normal prostate gland. Other:  None. Musculoskeletal: No acute or significant findings. Specifically, the left iliopsoas muscles are unremarkable. IMPRESSION: 1. No acute abnormality. Specifically, the left iliopsoas muscles are unremarkable. 2. Kissing bilateral common iliac and external iliac stents are identified. There is significant in stent stenosis of the bilateral common iliac arteries with reduced flow, but no definite stent occlusion, although evaluation is limited by phase of contrast. If there is clinical concern for arterial thrombosis, recommend dedicated CTA of the abdomen and pelvis for further evaluation. Electronically Signed   By: Obie Dredge M.D.   On: 10/27/2023 12:48   DG HIP UNILAT WITH PELVIS 2-3 VIEWS LEFT Result Date: 10/26/2023 CLINICAL DATA:  Left hip pain after injury yesterday EXAM: DG HIP (WITH OR WITHOUT PELVIS) 2-3V LEFT COMPARISON:  None Available. FINDINGS: There is no evidence of hip fracture or dislocation. Mild osteophyte formation of the left hip is noted. 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 No acute abnormality seen. Electronically Signed   By: Lupita Raider M.D.   On: 10/26/2023 17:20   MR BRAIN WO CONTRAST Result Date: 10/25/2023 CLINICAL DATA:  Neuro deficit, acute, stroke suspected. Left leg weakness beginning yesterday. EXAM: MRI HEAD WITHOUT CONTRAST TECHNIQUE: Multiplanar, multiecho pulse sequences of the brain and  surrounding structures were obtained without intravenous contrast. COMPARISON:  Head CT same day.  MRI 03/22/2021 FINDINGS: Brain: Diffusion imaging does not show any acute or subacute infarction or other cause of restricted diffusion. Chronic small-vessel ischemic changes affect  the pons. No focal cerebellar insult. There are numerous old small vessel infarctions within both thalami. Extensive chronic small-vessel ischemic changes present throughout the hemispheric white matter. No large vessel territory stroke. No mass, hemorrhage, hydrocephalus or extra-axial collection. Compared to the study of 2020, findings appear quite similar. Vascular: Major vessels at the base of the brain show flow. Skull and upper cervical spine: Negative Sinuses/Orbits: Mild seasonal mucosal thickening. No advanced sinus disease. Orbits negative. Other: None IMPRESSION: No acute finding by MRI. Extensive chronic small-vessel ischemic changes of the pons, thalami and hemispheric white matter. No change since 2020. Electronically Signed   By: Paulina Fusi M.D.   On: 10/25/2023 18:21   US Venous Img Lower Unilateral Left (DVT) Result Date: 10/25/2023 CLINICAL DATA:  Left lower extremity pain. EXAM: LEFT LOWER EXTREMITY VENOUS DOPPLER ULTRASOUND TECHNIQUE: Gray-scale sonography with graded compression, as well as color Doppler and duplex ultrasound were performed to evaluate the lower extremity deep venous systems from the level of the common femoral vein and including the common femoral, femoral, profunda femoral, popliteal and calf veins including the posterior tibial, peroneal and gastrocnemius veins when visible. The superficial great saphenous vein was also interrogated. Spectral Doppler was utilized to evaluate flow at rest and with distal augmentation maneuvers in the common femoral, femoral and popliteal veins. COMPARISON:  None Available. FINDINGS: Contralateral Common Femoral Vein: Respiratory phasicity is normal and symmetric  with the symptomatic side. No evidence of thrombus. Normal compressibility. Common Femoral Vein: No evidence of thrombus. Normal compressibility, respiratory phasicity and response to augmentation. Saphenofemoral Junction: No evidence of thrombus. Normal compressibility and flow on color Doppler imaging. Profunda Femoral Vein: No evidence of thrombus. Normal compressibility and flow on color Doppler imaging. Femoral Vein: No evidence of thrombus. Normal compressibility, respiratory phasicity and response to augmentation. Popliteal Vein: No evidence of thrombus. Normal compressibility, respiratory phasicity and response to augmentation. Calf Veins: No evidence of thrombus. Normal compressibility and flow on color Doppler imaging. Superficial Great Saphenous Vein: No evidence of thrombus. Normal compressibility. Venous Reflux:  None. Other Findings: No evidence of superficial thrombophlebitis or abnormal fluid collection. IMPRESSION: No evidence of left lower extremity deep venous thrombosis. Electronically Signed   By: Irish Lack M.D.   On: 10/25/2023 17:03   DG Chest Port 1 View Result Date: 10/25/2023 CLINICAL DATA:  Shortness of breath with exertion.  Weakness. EXAM: PORTABLE CHEST 1 VIEW COMPARISON:  05/31/2023 FINDINGS: Heart size is normal. Chronic aortic atherosclerosis is noted. Chronic pulmonary scarring, most prominent in the left lower lobe. Findings appear similar to the study of September. Cannot rule out some coexistence left base pneumonia, but most if not all the findings in this area appear chronic. No pleural effusion. No acute bone finding. IMPRESSION: Chronic pulmonary scarring, most prominent in the left lower lobe. Cannot exclude some coexistent left base pneumonia, but most if not all the findings in this area appear chronic. Electronically Signed   By: Paulina Fusi M.D.   On: 10/25/2023 15:09   CT Head Wo Contrast Result Date: 10/25/2023 CLINICAL DATA:  Neuro deficit, acute, stroke  suspected. Fell yesterday. Bilateral leg pain. EXAM: CT HEAD WITHOUT CONTRAST TECHNIQUE: Contiguous axial images were obtained from the base of the skull through the vertex without intravenous contrast. RADIATION DOSE REDUCTION: This exam was performed according to the departmental dose-optimization program which includes automated exposure control, adjustment of the mA and/or kV according to patient size and/or use of iterative reconstruction technique. COMPARISON:  MRI 03/23/2019 FINDINGS: Brain:  Generalized brain atrophy without subjective lobar predominance. Old small vessel infarction within the right pons. No focal cerebellar insult. Old infarction in both thalami and basal ganglia. Advanced chronic small-vessel ischemic changes of the white matter. No sign of acute infarction. No mass, hemorrhage, obstructive hydrocephalus or extra-axial collection. Ventricular size is in proportion to the degree of atrophy. Vascular: There is atherosclerotic calcification of the major vessels at the base of the brain. Skull: Negative Sinuses/Orbits: Clear/normal Other: None IMPRESSION: No acute CT finding. Atrophy and chronic small-vessel ischemic changes of the white matter. Old small vessel infarctions in the right pons, both thalami and basal ganglia. Electronically Signed   By: Paulina Fusi M.D.   On: 10/25/2023 15:08   MR LUMBAR SPINE WO CONTRAST Result Date: 10/25/2023 CLINICAL DATA:  Lumbar radiculopathy, increased fracture risk. EXAM: MRI LUMBAR SPINE WITHOUT CONTRAST TECHNIQUE: Multiplanar, multisequence MR imaging of the lumbar spine was performed. No intravenous contrast was administered. COMPARISON:  MRI of the lumbar spine November 07, 2008. FINDINGS: Segmentation:  Standard. Alignment:  Physiologic. Vertebrae: No fracture, evidence of discitis, or bone lesion. Schmorl node in the inferior endplate of L4. Conus medullaris and cauda equina: Conus extends to the T12-L1 level. Conus and cauda equina appear normal.  Paraspinal and other soft tissues: Negative. Disc levels: T12-L1: No spinal canal or neural foraminal stenosis. L1-2: Minimal disc bulge. No spinal canal or neural foraminal stenosis. L2-3: Shallow disc bulge and mild facet degenerative changes resulting in mild bilateral neural foraminal narrowing. No significant spinal canal stenosis. L3-4: Shallow disc bulge with superimposed left foraminal annular tear and mild facet degenerative changes. Mild left neural foraminal narrowing. No significant spinal canal stenosis. L4-5: Disc bulge, mild facet degenerative changes and ligamentum flavum redundancy resulting in mild bilateral neural foraminal narrowing. No significant spinal canal stenosis. L5-S1: Mild facet degenerative changes. No spinal canal or neural foraminal stenosis. IMPRESSION: 1. Mild degenerative changes of the lumbar spine with mild neural foraminal narrowing at L2-L3, L3-L4 and L4-L5. 2. No high-grade spinal canal or neural foraminal stenosis at any level. Electronically Signed   By: Baldemar Lenis M.D.   On: 10/25/2023 13:02         Sunnie Nielsen, DO [facility] 10/28/2023, 12:15 PM    Dictation software may have been used to generate the above note. Typos may occur and escape review in typed/dictated notes. Please contact Dr Lyn Hollingshead directly for clarity if needed.  Staff may message me via secure chat in Epic  but this may not receive an immediate response,  please page me for urgent matters!  If 7PM-7AM, please contact night coverage [email or web address]

## 2023-10-28 NOTE — Consult Note (Signed)
 Hospital Consult    Reason for Consult:  Left Leg Pain  Requesting Physician:  Dr Amedeo Kinsman DO MRN #:  [id number]  History of Present Illness: This is a 63 y.o. male with history of hyperlipidemia, hypertension, neuropathy, BPH, PAD, CKD 3A, moderate COPD, cigarette/tobacco user, history of CVA, insomnia, alcohol dependence with history of delirium tremens, who presents to the emergency department for chief concerns of left leg weakness.   Upon workup patient was noted to have a left lower extremity femur fracture which is most likely the cause of his pain.  Patient's bilateral lower extremities are warm to touch with palpable pulses this morning.  Patient denies having any pain in his bilateral groin areas or to his leg other than his left hip.  Vascular surgery was consulted to review the patient scans and assess prior iliac stent placements bilaterally.  Past Medical History:  Diagnosis Date   Depression    Emphysema of lung (HCC)    Essential hypertension    History of chicken pox    Hyponatremia    Insomnia    Low weight    MRSA nasal colonization    Neuropathy    bilateral lower legs   Peripheral vascular disease (HCC)    Pneumonia    Prostate disease    Seasonal allergies    Seizures (HCC)    Sensory ataxia    Sepsis (HCC)    Stroke (HCC)    Wears dentures    full upper    Past Surgical History:  Procedure Laterality Date   ABDOMINAL AORTIC ENDOVASCULAR STENT GRAFT Bilateral 03/16/2019   Procedure: AORTIC ILIAC STENT;  Surgeon: Annice Needy, MD;  Location: [facility] ORS;  Service: Vascular;  Laterality: Bilateral;   AMPUTATION TOE Right 06/16/2019   Procedure: 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;  Surgeon: Gwyneth Revels, DPM;  Location: [facility] ORS;  Service: Podiatry;  Laterality: Right;   CATARACT EXTRACTION W/PHACO Left 05/18/2018   Procedure: CATARACT EXTRACTION PHACO AND INTRAOCULAR LENS PLACEMENT (IOC) LEFT;  Surgeon: Lockie Mola, MD;  Location: [facility];  Service: Ophthalmology;  Laterality: Left;   COLONOSCOPY WITH PROPOFOL N/A 02/25/2016   Procedure: COLONOSCOPY WITH PROPOFOL;  Surgeon: Midge Minium, MD;  Location: [facility] ENDOSCOPY;  Service: Endoscopy;  Laterality: N/A;   ENDARTERECTOMY FEMORAL Bilateral 03/16/2019   Procedure: ENDARTERECTOMY FEMORAL;  Surgeon: Annice Needy, MD;  Location: [facility] ORS;  Service: Vascular;  Laterality: Bilateral;   EYE SURGERY  2015   Cataract   FRACTURE SURGERY     ORIF wrist   LOWER EXTREMITY ANGIOGRAPHY Left 03/13/2019   Procedure: Lower Extremity Angiography (LEFT);  Surgeon: Annice Needy, MD;  Location: [facility];  Service: Cardiovascular;  Laterality: Left;   VASCULAR SURGERY     "7 stents in legs"    Allergies  Allergen Reactions   Lidocaine Other (See Comments)    unknown    Prior to Admission medications   Medication Sig Start Date End Date Taking? Authorizing Provider  acetaminophen (TYLENOL) 500 MG tablet Take 500 mg by mouth every 6 (six) hours as needed for moderate pain.    Yes [provider]  albuterol (VENTOLIN HFA) 108 (90 Base) MCG/ACT inhaler Inhale 2 puffs into the lungs every 6 (six) hours as needed for wheezing or shortness of breath. 02/15/19  Yes Karamalegos, Netta Neat, DO  amitriptyline (ELAVIL) 10 MG tablet Take 10 mg by mouth at bedtime. 09/15/22  Yes [provider]  atorvastatin (LIPITOR) 10 MG  tablet Take 10 mg by mouth daily. 10/09/23  Yes [provider]  Carboxymeth-Glycerin-Polysorb (REFRESH DIGITAL) 0.5-1-0.5 % SOLN Apply to eye.   Yes [provider]  diltiazem (CARDIZEM CD) 240 MG 24 hr capsule Take 1 capsule (240 mg total) by mouth daily. 03/25/19  Yes Gouru, Deanna Artis, MD  docusate sodium (COLACE) 100 MG capsule Take 100 mg by mouth daily.   Yes [provider]  ELIQUIS 2.5 MG TABS tablet Take 2.5 mg by mouth 2 (two) times daily. 09/15/22  Yes [provider]  ferrous sulfate 325 (65 FE) MG EC tablet Take 325 mg  by mouth daily at 6 (six) AM.   Yes [provider]  finasteride (PROSCAR) 5 MG tablet Take 1 tablet (5 mg total) by mouth daily. 10/19/23  Yes Vanna Scotland, MD  gabapentin (NEURONTIN) 100 MG capsule Take 100 mg by mouth 3 (three) times daily. Take in addition to one 400 mg capsule for total 500 mg three times daily 10/09/23  Yes [provider]  gabapentin (NEURONTIN) 400 MG capsule Take 400 mg by mouth 3 (three) times daily. Take in addition to one 100 mg capsule for total 500 mg three times daily   Yes [provider]  hydrALAZINE (APRESOLINE) 50 MG tablet Take 50 mg by mouth in the morning, at noon, in the evening, and at bedtime. 09/15/22  Yes [provider]  losartan (COZAAR) 25 MG tablet Take 25 mg by mouth daily.   Yes [provider]  magnesium oxide (MAG-OX) 400 (240 Mg) MG tablet Take 400 mg by mouth daily.   Yes [provider]  melatonin 5 MG TABS Take 10 mg by mouth at bedtime.   Yes [provider]  methocarbamol (ROBAXIN) 500 MG tablet Take 500 mg by mouth every 8 (eight) hours as needed for muscle spasms.   Yes [provider]  metoprolol succinate (TOPROL-XL) 50 MG 24 hr tablet Take 2 tablets (100 mg total) by mouth daily. Patient taking differently: Take 50 mg by mouth daily. 03/24/19  Yes Gouru, Deanna Artis, MD  Multiple Vitamins-Minerals (MULTIVITAMIN WITH MINERALS) tablet Take 1 tablet by mouth daily.   Yes [provider]  naproxen (NAPROSYN) 500 MG tablet Take 500 mg by mouth 2 (two) times daily. 09/23/23  Yes [provider]  oxyCODONE-acetaminophen (PERCOCET) 10-325 MG tablet Take 1 tablet by mouth every 6 (six) hours as needed.   Yes [provider]  tamsulosin (FLOMAX) 0.4 MG CAPS capsule Take 1 capsule (0.4 mg total) by mouth daily after supper. Patient taking differently: Take 0.4 mg by mouth 2 (two) times daily. 01/12/19  Yes Karamalegos, Netta Neat, DO  Tiotropium Bromide  Monohydrate (SPIRIVA RESPIMAT) 2.5 MCG/ACT AERS Inhale 2 puffs into the lungs daily.   Yes [provider]  traZODone (DESYREL) 50 MG tablet Take 50 mg by mouth at bedtime. 09/15/22  Yes [provider]  vitamin C (VITAMIN C) 250 MG tablet Take 1 tablet (250 mg total) by mouth 2 (two) times daily. 03/24/19  Yes Gouru, Deanna Artis, MD  apixaban (ELIQUIS) 5 MG TABS tablet Take 1 tablet (5 mg total) by mouth 2 (two) times daily. Patient not taking: Reported on 10/25/2023 03/24/19   Ramonita Lab, MD  cholecalciferol (VITAMIN D) 25 MCG tablet Take 1 tablet (1,000 Units total) by mouth daily. Patient not taking: Reported on 10/26/2023 03/24/19   Ramonita Lab, MD  lidocaine (LIDODERM) 5 % Place 1 patch onto the skin every 12 (twelve) hours. Remove &  Discard patch within 12 hours or as directed by MD Patient not taking: Reported on 10/26/2023 05/31/23 05/30/24  Delton Prairie, MD  [facility] HANDIHALER 18 MCG inhalation capsule 1 capsule daily. Patient not taking: Reported on 10/26/2023    [provider]    Social History   Socioeconomic History   Marital status: Single    Spouse name: Not on file   Number of children: Not on file   Years of education: Not on file   Highest education level: Not on file  Occupational History   Not on file  Tobacco Use   Smoking status: Former    Current packs/day: 0.50    Average packs/day: 0.5 packs/day for 40.8 years (20.4 ttl pk-yrs)    Types: Cigarettes    Start date: 01/26/1983   Smokeless tobacco: Former    Quit date: 08/31/2016   Tobacco comments:    Previously quit in 09/2016 on Chantix until 09/2017  Vaping Use   Vaping status: Never Used  Substance and Sexual Activity   Alcohol use: Not Currently    Alcohol/week: 0.0 standard drinks of alcohol    Comment: occ   Drug use: No   Sexual activity: Not on file  Other Topics Concern   Not on file  Social History Narrative   Not on file   Social Drivers of Health   Financial Resource  Strain: Low Risk  (10/12/2023)   [facility] from [facility]   Overall Financial Resource Strain (CARDIA)    Difficulty of Paying Living Expenses: Not hard at all  Food Insecurity: No Food Insecurity (10/26/2023)   Hunger Vital Sign    Worried About Running Out of Food in the Last Year: Never true    Ran Out of Food in the Last Year: Never true  Transportation Needs: No Transportation Needs (10/26/2023)   PRAPARE - Administrator, Civil Service (Medical): No    Lack of Transportation (Non-Medical): No  Physical Activity: Unknown (03/13/2019)   Exercise Vital Sign    Days of Exercise per Week: 0 days    Minutes of Exercise per Session: Not on file  Stress: Not on file  Social Connections: Unknown (10/26/2023)   Social Connection and Isolation Panel [NHANES]    Frequency of Communication with Friends and Family: Twice a week    Frequency of Social Gatherings with Friends and Family: Never    Attends Religious Services: Never    Database administrator or Organizations: No    Attends Banker Meetings: Never    Marital Status: Patient declined  Catering manager Violence: Not At Risk (10/26/2023)   Humiliation, Afraid, Rape, and Kick questionnaire    Fear of Current or Ex-Partner: No    Emotionally Abused: No    Physically Abused: No    Sexually Abused: No     Family History  Problem Relation Age of Onset   Ulcers Father        bleeding   Cancer Mother        Breast    ROS: Otherwise negative unless mentioned in HPI  Physical Examination  Vitals:   10/27/23 2023 10/28/23 0427  BP: 131/81 132/77  Pulse: 95 89  Resp:  17  Temp: 98.5 F (36.9 C) 98.5 F (36.9 C)  SpO2: 96% 96%   Body mass index is 16.27 kg/m.  General:  WDWN in NAD Gait: Not observed HENT: WNL, normocephalic Pulmonary: normal non-labored breathing, without Rales, rhonchi,  wheezing Cardiac:  regular, without  Murmurs, rubs or gallops; without carotid  bruits Abdomen: Positive bowel sounds throughout, soft, NT/ND, no masses Skin: without rashes Vascular Exam/Pulses: Palpable pulses throughout Extremities: without ischemic changes, without Gangrene , without cellulitis; without open wounds;  Musculoskeletal: no muscle wasting or atrophy  Neurologic: A&O X 3;  No focal weakness or paresthesias are detected; speech is fluent/normal Psychiatric:  The pt has Normal affect. Lymph:  Unremarkable  CBC    Component Value Date/Time   WBC 7.4 10/26/2023 0454   RBC 3.18 (L) 10/26/2023 0454   HGB 10.3 (L) 10/26/2023 0454   HGB 14.9 07/31/2015 1117   HCT 30.9 (L) 10/26/2023 0454   HCT 42.5 07/31/2015 1117   PLT 155 10/26/2023 0454   PLT 161 07/31/2015 1117   MCV 97.2 10/26/2023 0454   MCV 96 07/31/2015 1117   MCV 98 11/20/2011 0510   MCH 32.4 10/26/2023 0454   MCHC 33.3 10/26/2023 0454   RDW 13.6 10/26/2023 0454   RDW 13.8 07/31/2015 1117   RDW 14.9 (H) 11/20/2011 0510   LYMPHSABS 1.7 03/14/2019 0501   LYMPHSABS 1.4 07/31/2015 1117   LYMPHSABS 1.1 11/20/2011 0510   MONOABS 1.4 (H) 03/14/2019 0501   MONOABS 0.8 (H) 11/20/2011 0510   EOSABS 0.0 03/14/2019 0501   EOSABS 0.2 07/31/2015 1117   EOSABS 0.1 11/20/2011 0510   BASOSABS 0.0 03/14/2019 0501   BASOSABS 0.0 07/31/2015 1117   BASOSABS 0.0 11/20/2011 0510    BMET    Component Value Date/Time   NA 138 10/28/2023 0452   NA 125 (L) 02/07/2016 1305   NA 130 (L) 11/20/2011 0510   K 3.4 (L) 10/28/2023 0452   K 4.0 11/20/2011 0510   CL 108 10/28/2023 0452   CL 98 11/20/2011 0510   CO2 23 10/28/2023 0452   CO2 18 (L) 11/20/2011 0510   GLUCOSE 96 10/28/2023 0452   GLUCOSE 82 11/20/2011 0510   BUN 25 (H) 10/28/2023 0452   BUN 5 (L) 02/07/2016 1305   BUN 1 (L) 11/20/2011 0510   CREATININE 1.38 (H) 10/28/2023 0452   CREATININE 0.93 01/05/2019 0800   CALCIUM 8.7 (L) 10/28/2023 0452   CALCIUM 8.5 11/20/2011 0510   GFRNONAA 59 (L) 10/28/2023 0452   GFRNONAA 92 01/05/2019 0800    GFRAA >60 03/24/2019 0842   GFRAA 107 01/05/2019 0800    COAGS: Lab Results  Component Value Date   INR 1.3 (H) 03/23/2021   INR 1.3 (H) 03/16/2019   INR 1.3 (H) 03/15/2019     Non-Invasive Vascular Imaging:   EXAM:10/27/23 CT PELVIS WITH CONTRAST   TECHNIQUE: Multidetector CT imaging of the pelvis was performed using the standard protocol following the bolus administration of intravenous contrast.   RADIATION DOSE REDUCTION: This exam was performed according to the departmental dose-optimization program which includes automated exposure control, adjustment of the mA and/or kV according to patient size and/or use of iterative reconstruction technique.   CONTRAST:  OMNIPAQUE IOHEXOL 300 MG/ML  SOLN   COMPARISON:  CT abdomen pelvis dated September 18, 2011.   FINDINGS: Urinary Tract:  No abnormality visualized.   Bowel:  Small stool ball in the rectum.  Otherwise unremarkable.   Vascular/Lymphatic: Kissing bilateral common iliac and external iliac stents are identified. There is significant in stent stenosis of the bilateral common iliac arteries with reduced flow, but no definite stent occlusion, although evaluation is limited by phase of contrast. No enlarged pelvic lymph nodes.   Reproductive:  Normal prostate gland.  Other:  None.   Musculoskeletal: No acute or significant findings. Specifically, the left iliopsoas muscles are unremarkable.   IMPRESSION: 1. No acute abnormality. Specifically, the left iliopsoas muscles are unremarkable. 2. Kissing bilateral common iliac and external iliac stents are identified. There is significant in stent stenosis of the bilateral common iliac arteries with reduced flow, but no definite stent occlusion, although evaluation is limited by phase of contrast. If there is clinical concern for arterial thrombosis, recommend dedicated CTA of the abdomen and pelvis for further evaluation.  EXAM:10/27/23 MR OF THE LEFT LOWER  EXTREMITY WITHOUT AND WITH CONTRAST   TECHNIQUE: Multiplanar, multisequence MR imaging of the left thigh was performed both before and after administration of intravenous contrast.   CONTRAST:  5mL GADAVIST GADOBUTROL 1 MMOL/ML IV SOLN   COMPARISON:  None Available.   FINDINGS: Bones/Joint/Cartilage   Acute nondisplaced left intertrochanteric femur fracture. No dislocation. Joint spaces are preserved. Small left hip joint effusion.   Muscles and Tendons Prominent edema in the proximal left adductor muscles and proximal vastus medialis muscle. No associated enhancement. No significant muscle atrophy.   Soft tissue No fluid collection or hematoma.  No soft tissue mass.   IMPRESSION: 1. Acute nondisplaced left intertrochanteric femur fracture. 2. Prominent edema in the proximal left adductor muscles and proximal vastus medialis muscle, likely strains/post-traumatic. No associated enhancement to suggest myositis.   Statin:  Yes.   Beta Blocker:  Yes.   Aspirin:  No. ACEI:  No. ARB:  Yes.   CCB use:  Yes Other antiplatelets/anticoagulants:  Yes.   Eliquis 2.5 mg BID    ASSESSMENT/PLAN: This is a 63 y.o. male who presents to [facility] emergency department 2 days ago with left lower extremity pain.  Upon further workup he was noted to have a left femur fracture.  Patient is going to the operating room tomorrow to have that repaired.  After reviewing the scans both CT and MRI with Dr. Festus Barren MD vascular surgery does not plan on any intervention at this time.  Patient was last seen in clinic on 10/05/2023 with normal ABIs to his lower extremities.  On exam today the patient's bilateral lower extremities are warm to touch with palpable pulses.  His left lower extremity pain is most likely from his femur fracture and his chronic neuropathy to his legs.  The stenosis that was read on the scans most likely due to metal artifact due to the numerous stents he has placed in his lower aorta  and his bilateral iliac arteries.  Vascular surgery will follow-up with the patient in clinic as previously scheduled. - Discussed the plan in detail with Dr. Festus Barren MD and he agrees with the plan.   Marcie Bal [facility] 10/28/2023 11:00 AM

## 2023-10-29 ENCOUNTER — Encounter: Payer: Self-pay | Admitting: Obstetrics and Gynecology

## 2023-10-29 ENCOUNTER — Inpatient Hospital Stay: Payer: Medicaid Other

## 2023-10-29 ENCOUNTER — Other Ambulatory Visit: Payer: Self-pay

## 2023-10-29 ENCOUNTER — Encounter
Admission: EM | Disposition: A | Discharge status: Home Health | Payer: Self-pay | Source: Skilled Nursing Facility | Attending: Osteopathic Medicine

## 2023-10-29 ENCOUNTER — Inpatient Hospital Stay: Payer: Medicaid Other | Admitting: Anesthesiology

## 2023-10-29 DIAGNOSIS — Z87891 Personal history of nicotine dependence: Secondary | ICD-10-CM | POA: Diagnosis not present

## 2023-10-29 DIAGNOSIS — S72142D Displaced intertrochanteric fracture of left femur, subsequent encounter for closed fracture with routine healing: Secondary | ICD-10-CM | POA: Diagnosis not present

## 2023-10-29 DIAGNOSIS — S72002A Fracture of unspecified part of neck of left femur, initial encounter for closed fracture: Secondary | ICD-10-CM | POA: Diagnosis not present

## 2023-10-29 DIAGNOSIS — N1831 Chronic kidney disease, stage 3a: Secondary | ICD-10-CM | POA: Diagnosis not present

## 2023-10-29 DIAGNOSIS — S72102A Unspecified trochanteric fracture of left femur, initial encounter for closed fracture: Secondary | ICD-10-CM | POA: Diagnosis not present

## 2023-10-29 DIAGNOSIS — R29898 Other symptoms and signs involving the musculoskeletal system: Secondary | ICD-10-CM | POA: Diagnosis not present

## 2023-10-29 DIAGNOSIS — I129 Hypertensive chronic kidney disease with stage 1 through stage 4 chronic kidney disease, or unspecified chronic kidney disease: Secondary | ICD-10-CM | POA: Diagnosis not present

## 2023-10-29 HISTORY — PX: INTRAMEDULLARY (IM) NAIL INTERTROCHANTERIC: SHX5875

## 2023-10-29 LAB — BASIC METABOLIC PANEL
Anion gap: 8 (ref 5–15)
BUN: 17 mg/dL (ref 6–20)
CO2: 24 mmol/L (ref 22–32)
Calcium: 8.7 mg/dL — ABNORMAL LOW (ref 8.9–10.3)
Chloride: 104 mmol/L (ref 98–111)
Creatinine, Ser: 1.3 mg/dL — ABNORMAL HIGH (ref 0.61–1.24)
GFR, Estimated: >60 mL/min (ref >60)
Glucose, Bld: 92 mg/dL (ref 70–99)
Potassium: 3.8 mmol/L (ref 3.5–5.1)
Sodium: 136 mmol/L (ref 135–145)

## 2023-10-29 SURGERY — FIXATION, FRACTURE, INTERTROCHANTERIC, WITH INTRAMEDULLARY ROD
Anesthesia: General | Site: Hip | Laterality: Left

## 2023-10-29 MED ORDER — ROCURONIUM BROMIDE 10 MG/ML (PF) SYRINGE
PREFILLED_SYRINGE | INTRAVENOUS | Status: AC
  Filled 2023-10-29: qty 10

## 2023-10-29 MED ORDER — DOCUSATE SODIUM 100 MG PO CAPS: 100.0000 mg | ORAL_CAPSULE | Freq: Every day | ORAL | Status: DC

## 2023-10-29 MED ORDER — MIDAZOLAM HCL 2 MG/2ML IJ SOLN
INTRAMUSCULAR | Status: AC
  Filled 2023-10-29: qty 2

## 2023-10-29 MED ORDER — ONDANSETRON HCL 4 MG PO TABS: 4.0000 mg | ORAL_TABLET | Freq: Four times a day (QID) | ORAL | Status: DC | PRN

## 2023-10-29 MED ORDER — PROPOFOL 10 MG/ML IV BOLUS
INTRAVENOUS | Status: DC | PRN
  Administered 2023-10-29: 100 mg via INTRAVENOUS

## 2023-10-29 MED ORDER — GABAPENTIN 300 MG PO CAPS
300.0000 mg | ORAL_CAPSULE | Freq: Three times a day (TID) | ORAL | Status: DC
  Administered 2023-10-29: 300 mg via ORAL
  Filled 2023-10-29: qty 1

## 2023-10-29 MED ORDER — ONDANSETRON HCL 4 MG/2ML IJ SOLN: 4.0000 mg | Freq: Four times a day (QID) | INTRAMUSCULAR | Status: DC | PRN

## 2023-10-29 MED ORDER — BUPIVACAINE LIPOSOME 1.3 % IJ SUSP
INTRAMUSCULAR | Status: DC | PRN
  Administered 2023-10-29: 40 mL

## 2023-10-29 MED ORDER — OXYCODONE HCL 5 MG PO TABS
5.0000 mg | ORAL_TABLET | ORAL | Status: DC | PRN
  Administered 2023-10-29 – 2023-11-01 (×11): 10 mg via ORAL
  Filled 2023-10-29 (×11): qty 2

## 2023-10-29 MED ORDER — LABETALOL HCL 5 MG/ML IV SOLN
INTRAVENOUS | Status: AC
  Filled 2023-10-29: qty 4

## 2023-10-29 MED ORDER — TIOTROPIUM BROMIDE MONOHYDRATE 2.5 MCG/ACT IN AERS
2.0000 | INHALATION_SPRAY | Freq: Every day | RESPIRATORY_TRACT | Status: DC
Start: 2023-10-29 — End: 2023-10-29

## 2023-10-29 MED ORDER — METOCLOPRAMIDE HCL 5 MG PO TABS: 5.0000 mg | ORAL_TABLET | Freq: Three times a day (TID) | ORAL | Status: DC | PRN

## 2023-10-29 MED ORDER — FENTANYL CITRATE (PF) 100 MCG/2ML IJ SOLN: 25.0000 ug | INTRAMUSCULAR | Status: DC | PRN

## 2023-10-29 MED ORDER — HYDROMORPHONE HCL 1 MG/ML IJ SOLN
INTRAMUSCULAR | Status: AC
  Filled 2023-10-29: qty 1

## 2023-10-29 MED ORDER — METOCLOPRAMIDE HCL 5 MG/ML IJ SOLN: 5.0000 mg | Freq: Three times a day (TID) | INTRAMUSCULAR | Status: DC | PRN

## 2023-10-29 MED ORDER — OXYCODONE HCL 5 MG/5ML PO SOLN: 5.0000 mg | Freq: Once | ORAL | Status: DC | PRN

## 2023-10-29 MED ORDER — BUPIVACAINE HCL (PF) 0.5 % IJ SOLN
INTRAMUSCULAR | Status: AC
  Filled 2023-10-29: qty 30

## 2023-10-29 MED ORDER — FENTANYL CITRATE (PF) 100 MCG/2ML IJ SOLN
INTRAMUSCULAR | Status: AC
  Filled 2023-10-29: qty 2

## 2023-10-29 MED ORDER — SODIUM CHLORIDE 0.9 % IV SOLN: INTRAVENOUS | Status: DC | PRN

## 2023-10-29 MED ORDER — FENTANYL CITRATE (PF) 100 MCG/2ML IJ SOLN
INTRAMUSCULAR | Status: DC | PRN
  Administered 2023-10-29 (×3): 50 ug via INTRAVENOUS

## 2023-10-29 MED ORDER — SUGAMMADEX SODIUM 200 MG/2ML IV SOLN
INTRAVENOUS | Status: AC
  Filled 2023-10-29: qty 2

## 2023-10-29 MED ORDER — DEXAMETHASONE SODIUM PHOSPHATE 10 MG/ML IJ SOLN
INTRAMUSCULAR | Status: AC
  Filled 2023-10-29: qty 1

## 2023-10-29 MED ORDER — MIDAZOLAM HCL 2 MG/2ML IJ SOLN
INTRAMUSCULAR | Status: DC | PRN
  Administered 2023-10-29: 2 mg via INTRAVENOUS

## 2023-10-29 MED ORDER — SUGAMMADEX SODIUM 200 MG/2ML IV SOLN
INTRAVENOUS | Status: DC | PRN
  Administered 2023-10-29: 200 mg via INTRAVENOUS

## 2023-10-29 MED ORDER — EPHEDRINE 5 MG/ML INJ
INTRAVENOUS | Status: AC
  Filled 2023-10-29: qty 5

## 2023-10-29 MED ORDER — KETOROLAC TROMETHAMINE 15 MG/ML IJ SOLN
15.0000 mg | Freq: Four times a day (QID) | INTRAMUSCULAR | Status: AC
  Administered 2023-10-29 – 2023-10-30 (×4): 15 mg via INTRAVENOUS
  Filled 2023-10-29 (×4): qty 1

## 2023-10-29 MED ORDER — CEFAZOLIN SODIUM-DEXTROSE 1-4 GM/50ML-% IV SOLN
1.0000 g | Freq: Four times a day (QID) | INTRAVENOUS | Status: AC
  Administered 2023-10-29 – 2023-10-30 (×3): 1 g via INTRAVENOUS
  Filled 2023-10-29 (×3): qty 50

## 2023-10-29 MED ORDER — BISACODYL 10 MG RE SUPP: 10.0000 mg | Freq: Every day | RECTAL | Status: DC | PRN

## 2023-10-29 MED ORDER — GABAPENTIN 300 MG PO CAPS
500.0000 mg | ORAL_CAPSULE | Freq: Three times a day (TID) | ORAL | Status: DC
  Administered 2023-10-29 – 2023-11-01 (×8): 500 mg via ORAL
  Filled 2023-10-29 (×8): qty 2

## 2023-10-29 MED ORDER — APIXABAN 2.5 MG PO TABS
2.5000 mg | ORAL_TABLET | Freq: Two times a day (BID) | ORAL | Status: DC
  Administered 2023-10-30 – 2023-11-01 (×5): 2.5 mg via ORAL
  Filled 2023-10-29 (×5): qty 1

## 2023-10-29 MED ORDER — METHOCARBAMOL 1000 MG/10ML IJ SOLN: 500.0000 mg | Freq: Four times a day (QID) | INTRAMUSCULAR | Status: DC | PRN

## 2023-10-29 MED ORDER — ADULT MULTIVITAMIN W/MINERALS CH
1.0000 | ORAL_TABLET | Freq: Every day | ORAL | Status: DC
  Administered 2023-10-30 – 2023-11-01 (×3): 1 via ORAL
  Filled 2023-10-29 (×3): qty 1

## 2023-10-29 MED ORDER — OXYCODONE HCL 5 MG PO TABS: 2.5000 mg | ORAL_TABLET | ORAL | Status: DC | PRN

## 2023-10-29 MED ORDER — BUPIVACAINE LIPOSOME 1.3 % IJ SUSP
INTRAMUSCULAR | Status: AC
  Filled 2023-10-29: qty 20

## 2023-10-29 MED ORDER — 0.9 % SODIUM CHLORIDE (POUR BTL) OPTIME
TOPICAL | Status: DC | PRN
Start: 2023-10-29 — End: 2023-10-29
  Administered 2023-10-29: 500 mL

## 2023-10-29 MED ORDER — ONDANSETRON HCL 4 MG/2ML IJ SOLN
INTRAMUSCULAR | Status: AC
  Filled 2023-10-29: qty 2

## 2023-10-29 MED ORDER — HYDROMORPHONE HCL 1 MG/ML IJ SOLN
0.2000 mg | INTRAMUSCULAR | Status: DC | PRN
  Administered 2023-10-29 – 2023-10-30 (×2): 0.4 mg via INTRAVENOUS
  Filled 2023-10-29 (×2): qty 0.5

## 2023-10-29 MED ORDER — METHOCARBAMOL 500 MG PO TABS: 500.0000 mg | ORAL_TABLET | Freq: Four times a day (QID) | ORAL | Status: DC | PRN

## 2023-10-29 MED ORDER — PHENYLEPHRINE 80 MCG/ML (10ML) SYRINGE FOR IV PUSH (FOR BLOOD PRESSURE SUPPORT)
PREFILLED_SYRINGE | INTRAVENOUS | Status: AC
  Filled 2023-10-29: qty 10

## 2023-10-29 MED ORDER — OXYCODONE HCL 5 MG PO TABS: 5.0000 mg | ORAL_TABLET | Freq: Once | ORAL | Status: DC | PRN

## 2023-10-29 MED ORDER — FLEET ENEMA RE ENEM: 1.0000 | ENEMA | Freq: Once | RECTAL | Status: DC | PRN

## 2023-10-29 MED ORDER — HYDROMORPHONE HCL 1 MG/ML IJ SOLN
INTRAMUSCULAR | Status: DC | PRN
  Administered 2023-10-29 (×2): .5 mg via INTRAVENOUS

## 2023-10-29 MED ORDER — ONDANSETRON HCL 4 MG/2ML IJ SOLN
INTRAMUSCULAR | Status: DC | PRN
  Administered 2023-10-29: 4 mg via INTRAVENOUS

## 2023-10-29 MED ORDER — PROPOFOL 10 MG/ML IV BOLUS
INTRAVENOUS | Status: AC
  Filled 2023-10-29: qty 20

## 2023-10-29 MED ORDER — DEXAMETHASONE SODIUM PHOSPHATE 10 MG/ML IJ SOLN
INTRAMUSCULAR | Status: DC | PRN
  Administered 2023-10-29: 8 mg via INTRAVENOUS

## 2023-10-29 MED ORDER — ROCURONIUM BROMIDE 100 MG/10ML IV SOLN
INTRAVENOUS | Status: DC | PRN
  Administered 2023-10-29: 40 mg via INTRAVENOUS

## 2023-10-29 MED ORDER — CEFAZOLIN SODIUM-DEXTROSE 2-4 GM/100ML-% IV SOLN
INTRAVENOUS | Status: AC
  Filled 2023-10-29: qty 100

## 2023-10-29 MED ORDER — LABETALOL HCL 5 MG/ML IV SOLN
INTRAVENOUS | Status: DC | PRN
  Administered 2023-10-29 (×2): 5 mg via INTRAVENOUS

## 2023-10-29 MED ORDER — ACETAMINOPHEN 500 MG PO TABS
1000.0000 mg | ORAL_TABLET | Freq: Three times a day (TID) | ORAL | Status: DC
  Administered 2023-10-30 – 2023-10-31 (×3): 1000 mg via ORAL
  Filled 2023-10-29 (×6): qty 2

## 2023-10-29 MED ORDER — DOCUSATE SODIUM 100 MG PO CAPS
100.0000 mg | ORAL_CAPSULE | Freq: Two times a day (BID) | ORAL | Status: DC
  Administered 2023-10-29 – 2023-10-31 (×5): 100 mg via ORAL
  Filled 2023-10-29 (×6): qty 1

## 2023-10-29 SURGICAL SUPPLY — 38 items
BIT DRILL INTERTAN LAG SCREW (BIT) IMPLANT
BIT DRILL LONG 4.0 (BIT) IMPLANT
BLADE SURG 15 STRL LF DISP TIS (BLADE) ×1 IMPLANT
CHLORAPREP W/TINT 26 (MISCELLANEOUS) ×1 IMPLANT
DRAPE SHEET LG 3/4 BI-LAMINATE (DRAPES) ×1 IMPLANT
DRAPE SURG 17X11 SM STRL (DRAPES) ×2 IMPLANT
DRAPE U-SHAPE 47X51 STRL (DRAPES) ×2 IMPLANT
DRILL BIT LONG 4.0 (BIT) ×1 IMPLANT
DRSG OPSITE POSTOP 3X4 (GAUZE/BANDAGES/DRESSINGS) ×3 IMPLANT
ELECT REM PT RETURN 9FT ADLT (ELECTROSURGICAL) ×1 IMPLANT
ELECTRODE REM PT RTRN 9FT ADLT (ELECTROSURGICAL) ×1 IMPLANT
GAUZE XEROFORM 4X4 STRL (GAUZE/BANDAGES/DRESSINGS) ×1 IMPLANT
GLOVE BIOGEL PI IND STRL 8 (GLOVE) ×1 IMPLANT
GLOVE SURG SYN 7.5 E (GLOVE) ×1 IMPLANT
GLOVE SURG SYN 7.5 PF PI (GLOVE) ×1 IMPLANT
GOWN STRL REUS W/ TWL LRG LVL3 (GOWN DISPOSABLE) ×1 IMPLANT
GOWN STRL REUS W/ TWL XL LVL3 (GOWN DISPOSABLE) ×1 IMPLANT
GUIDE PIN 3.2X343 (PIN) ×3 IMPLANT
KIT PATIENT CARE HANA TABLE (KITS) ×1 IMPLANT
KIT TURNOVER CYSTO (KITS) ×1 IMPLANT
MANIFOLD NEPTUNE II (INSTRUMENTS) ×1 IMPLANT
MAT ABSORB FLUID 56X50 GRAY (MISCELLANEOUS) ×2 IMPLANT
NAIL TRIGEN INTERTAN 10X18CM (Nail) IMPLANT
NDL HYPO 22X1.5 SAFETY MO (MISCELLANEOUS) ×1 IMPLANT
NEEDLE HYPO 22X1.5 SAFETY MO (MISCELLANEOUS) ×1 IMPLANT
NS IRRIG 500ML POUR BTL (IV SOLUTION) ×1 IMPLANT
PACK HIP COMPR (MISCELLANEOUS) ×1 IMPLANT
PENCIL SMOKE EVACUATOR (MISCELLANEOUS) ×1 IMPLANT
PIN GUIDE 3.2X343MM (PIN) IMPLANT
SCREW LAG COMPR KIT 85/80 (Screw) IMPLANT
SCREW TRIGEN LOW PROF 5.0X30 (Screw) IMPLANT
STAPLER SKIN PROX 35W (STAPLE) ×1 IMPLANT
SUT VIC AB 2-0 CT2 27 (SUTURE) ×1 IMPLANT
SYR 10ML LL (SYRINGE) ×1 IMPLANT
SYR 30ML LL (SYRINGE) ×1 IMPLANT
TAPE CLOTH 3X10 WHT NS LF (GAUZE/BANDAGES/DRESSINGS) ×2 IMPLANT
TRAP FLUID SMOKE EVACUATOR (MISCELLANEOUS) ×1 IMPLANT
WATER STERILE IRR 500ML POUR (IV SOLUTION) ×1 IMPLANT

## 2023-10-29 NOTE — Plan of Care (Signed)
  Problem: Education: Goal: Knowledge of disease or condition will improve Outcome: Progressing   Problem: Ischemic Stroke/TIA Tissue Perfusion: Goal: Complications of ischemic stroke/TIA will be minimized Outcome: Progressing   Problem: Health Behavior/Discharge Planning: Goal: Ability to manage health-related needs will improve Outcome: Progressing   Problem: Education: Goal: Knowledge of General Education information will improve Description: Including pain rating scale, medication(s)/side effects and non-pharmacologic comfort measures Outcome: Progressing

## 2023-10-29 NOTE — H&P (Signed)
 H&P reviewed. No significant changes noted.

## 2023-10-29 NOTE — Anesthesia Preprocedure Evaluation (Addendum)
 Anesthesia Evaluation  Patient identified by MRN, date of birth, ID band Patient awake    Reviewed: Allergy & Precautions, NPO status , Patient's Chart, lab work & pertinent test results  History of Anesthesia Complications Negative for: history of anesthetic complications  Airway Mallampati: III  TM Distance: >3 FB Neck ROM: full    Dental  (+) Edentulous Upper, Edentulous Lower   Pulmonary COPD,  COPD inhaler, former smoker   Pulmonary exam normal        Cardiovascular hypertension, On Medications + Peripheral Vascular Disease  Normal cardiovascular exam     Neuro/Psych  PSYCHIATRIC DISORDERS  Depression     Neuromuscular disease CVA, Residual Symptoms    GI/Hepatic negative GI ROS, Neg liver ROS,,,  Endo/Other  negative endocrine ROS    Renal/GU Renal disease     Musculoskeletal   Abdominal   Peds  Hematology negative hematology ROS (+)   Anesthesia Other Findings Past Medical History: No date: Depression No date: Emphysema of lung (HCC) No date: Essential hypertension No date: History of chicken pox No date: Hyponatremia No date: Insomnia No date: Low weight No date: MRSA nasal colonization No date: Neuropathy     Comment:  bilateral lower legs No date: Peripheral vascular disease (HCC) No date: Pneumonia No date: Prostate disease No date: Seasonal allergies No date: Seizures (HCC) No date: Sensory ataxia No date: Sepsis (HCC) No date: Stroke [facility]) No date: Wears dentures     Comment:  full upper  Past Surgical History: 03/16/2019: ABDOMINAL AORTIC ENDOVASCULAR STENT GRAFT; Bilateral     Comment:  Procedure: AORTIC ILIAC STENT;  Surgeon: Annice Needy,               MD;  Location: [facility] ORS;  Service: Vascular;  Laterality:              Bilateral; 06/16/2019: AMPUTATION TOE; Right     Comment:  Procedure: 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;  Surgeon:               Gwyneth Revels, DPM;  Location: [facility]  ORS;  Service:               Podiatry;  Laterality: Right; 05/18/2018: CATARACT EXTRACTION W/PHACO; Left     Comment:  Procedure: CATARACT EXTRACTION PHACO AND INTRAOCULAR               LENS PLACEMENT (IOC) LEFT;  Surgeon: Lockie Mola, MD;  Location: [facility];  Service:               Ophthalmology;  Laterality: Left; 02/25/2016: COLONOSCOPY WITH PROPOFOL; N/A     Comment:  Procedure: COLONOSCOPY WITH PROPOFOL;  Surgeon: Midge Minium, MD;  Location: [facility] ENDOSCOPY;  Service: Endoscopy;              Laterality: N/A; 03/16/2019: ENDARTERECTOMY FEMORAL; Bilateral     Comment:  Procedure: ENDARTERECTOMY FEMORAL;  Surgeon: Annice Needy, MD;  Location: [facility] ORS;  Service: Vascular;                Laterality: Bilateral; 2015: EYE SURGERY     Comment:  Cataract No date: FRACTURE SURGERY     Comment:  ORIF wrist 03/13/2019: LOWER EXTREMITY ANGIOGRAPHY; Left  Comment:  Procedure: Lower Extremity Angiography (LEFT);  Surgeon:              Annice Needy, MD;  Location: [facility] INV[facility] CV LAB;                Service: Cardiovascular;  Laterality: Left; No date: VASCULAR SURGERY     Comment:  "7 stents in legs"  BMI    Body Mass Index: 16.27 kg/m      Reproductive/Obstetrics negative OB ROS                             Anesthesia Physical Anesthesia Plan  ASA: 3  Anesthesia Plan: General ETT   Post-op Pain Management: Tylenol PO (pre-op)* and Gabapentin PO (pre-op)*   Induction: Intravenous  PONV Risk Score and Plan: 2 and Ondansetron, Dexamethasone, Treatment may vary due to age or medical condition and Midazolam  Airway Management Planned: Oral ETT  Additional Equipment:   Intra-op Plan:   Post-operative Plan: Extubation in OR  Informed Consent: I have reviewed the patients History and Physical, chart, labs and discussed the procedure including the risks, benefits and alternatives for the proposed  anesthesia with the patient or authorized representative who has indicated his/her understanding and acceptance.     Dental Advisory Given  Plan Discussed with: Anesthesiologist, CRNA and Surgeon  Anesthesia Plan Comments: (Patient consented for risks of anesthesia including but not limited to:  - adverse reactions to medications - damage to eyes, teeth, lips or other oral mucosa - nerve damage due to positioning  - sore throat or hoarseness - Damage to heart, brain, nerves, lungs, other parts of body or loss of life  Patient voiced understanding and assent.)        Anesthesia Quick Evaluation

## 2023-10-29 NOTE — Progress Notes (Signed)
 PROGRESS NOTE    Donald [name].   ZOX:[id number] DOB: 09-16-1960  DOA: 10/25/2023 Date of Service: 10/29/23 which is hospital day 3  PCP: Donald Lambert, NP    Hospital course / significant events:   HPI:  [name], is a 63 year old male with history of hyperlipidemia, hypertension, neuropathy, BPH, PAD, CKD 3A, moderate COPD, cigarette/tobacco user, history of CVA, insomnia, alcohol dependence with history of delirium tremens, who presents to the emergency department for chief concerns of left leg weakness. He reports that at baseline he ambulates without a walker. He endorses that he does live at assisted living due to having had 8 strokes in the past. He reports that yesterday, he fell because he was opening the door to his room when he suddenly felt left leg weakness, his left leg giving out on him and he fell. He denies loss of consciousness. He states that the weakness of his LLE primarily involves his thigh, with difficulty flexing his leg at the hip, but also with significant weakness of leg extension. He denies sensory loss in either of his lower extremities.   02/17: admitted to hospitalist service w/ concern for TIA/CVA. MRI brain showed nothing acute, (+)extensive chronic small vessel eischemic changes. LLE [place] Neg CVT. MRI L-spine (+)degenerative changes w/o high grade canal/foraminal stenosis  02/18: XR hip (+)degenerative joint disease no fracture/dislocation, Neurology saw pt - "Exam reveals focal weakness of his LLE, significantly worse proximally with 2-3/5 hip flexion, 3/5 hip adduction, 4-/5 knee extension and flexion, 4/5 ADF and APF." "DDx for focal proximal > distal LLE weakness... Includes lumbosacral plexopathy, focal myopathy, iliopsoas focal lesion such as a hematoma or mass, and focal ischemic nerve injury of the femoral nerve... Per literature search, intermittent claudication is one cause of hip and thigh pain with weakness of hip flexion." Recs for MRI L  thigh to assess for myopathy, which was ordered but not done this day. CT pelvis advised. CK level not collected. Hold statin. Consider vascular surgery consult/followup.  02/19: MRI L thigh/femur pending read. CT no iliopsoas abn but is showing some occlusive disease in bilateral common iliac and external iliac stents - I've asked vascular to weigh in on whether or to what extent this might be causing symptoms.  02/20: MRI (+)L femur fx, ortho to take to OR tomorrow. Vacsular saw pt, no concerns.  02/21: to OR for nail fixation L femur     Consultants:  Neurology  Vascular Surgery Orthopedic Surgery   Procedures/Surgeries: 10/29/23 left intramedullary intertrochanteric nail placement - Dr Signa Kell      ASSESSMENT & PLAN:   Acute nondisplaced left intertrochanteric femur fracture.  Potential for LLR weakness (see below) led to fall and still need w/u Ortho following To OR today for pin PT/OT to follow after surgery / per ortho  Left leg weakness Have reasonably ruled out CVA, lumbar disc disease / nerve compression Ddx Includes lumbosacral plexopathy, focal myopathy, iliopsoas focal lesion such as a hematoma or mass, focal ischemic nerve injury of the femoral nerve, intermittent claudication  Neurology has s/o  We were holding statin but can probably restart now  Consider outpatient Neurology referral for EMG/NCS if postop course still demonstrating significant weakness    PAD Normal ABIs last month and palpable distal pulses in left leg, do not think left leg dysfunction is 2/2 arterial disease Restart statin  holding apixaban pending ortho to OR  Vascular consult to comment on posisbility claudication may be causing weakness -->  no intervention, follow routinely outpatient, suspect neuropathy more a contributor, less so any claudication problem   History CVA MRI brain neg for acute stroke holding statin as above but can probably restart  holding apixaban pending ortho  to OR    HTN Here bp mild elevation, also tachycardic in setting of holding home dilt and metop resume home metop and dilt   CKD 3a Kidney function is at baseline. Bicarb low today from normal yesterday repeat bmp, may need further eval if bicarb remains low   Insomnia home amitryptyline   BPH home proscar, flomax   COPD Quiescent incruse for home spiriva   Neuropathy home gabapentin - reduced dose per pharmacy recs given CrCl   GAD home meds  History alcoholism Reports last drink 5 years ago No intervention     underweight based on BMI: Body mass index is 16.27 kg/m.  Underweight - under 18  overweight - 25 to 29 obese - 30 or more Class 1 obesity: BMI of 30.0 to 34 Class 2 obesity: BMI of 35.0 to 39 Class 3 obesity: BMI of 40.0 to 49 Super Morbid Obesity: BMI 50-59 Super-super Morbid Obesity: BMI 60+ Significantly low or high BMI is associated with higher medical risk.  Weight management advised as adjunct to other disease management and risk reduction treatments    DVT prophylaxis: eliquis  IV fluids: no continuous IV fluids  Nutrition: cardiac diet Central lines / invasive devices: none  Code Status: DNR ACP documentation reviewed:  none on file in [facility]  TOC needs: home health PT/OT - will need reassessed following surgery  Barriers to dispo / significant pending items: for surgery today w/ Ortho              Subjective / Brief ROS:  Patient reports weakness is about the same - examined preop Denies CP/SOB.  Pain controlled.  Denies new weakness.  Tolerating diet.  Reports no concerns w/ urination/defecation.   Family Communication: support person at bedside on rounds     Objective Findings:  Vitals:   10/29/23 1315 10/29/23 1329 10/29/23 1330 10/29/23 1341  BP: (!) 155/92  (!) 157/90 (!) 158/91  Pulse: (!) 103 (!) 114 (!) 115 (!) 108  Resp: 12 12 11 10   Temp:    98.4 F (36.9 C)  TempSrc:      SpO2: 99% 90% 90% 92%   Weight:      Height:        Intake/Output Summary (Last 24 hours) at 10/29/2023 1434 Last data filed at 10/29/2023 1244 Gross per 24 hour  Intake 600 ml  Output 50 ml  Net 550 ml   Filed Weights   10/25/23 0605 10/29/23 1057  Weight: 48.5 kg 48.5 kg    Examination:  Physical Exam Constitutional:      General: He is not in acute distress. Cardiovascular:     Rate and Rhythm: Normal rate and regular rhythm.  Pulmonary:     Effort: Pulmonary effort is normal.     Breath sounds: Normal breath sounds.  Abdominal:     Palpations: Abdomen is soft.  Musculoskeletal:     Right lower leg: No edema.     Left lower leg: No edema.  Neurological:     Mental Status: He is alert and oriented to person, place, and time. Mental status is at baseline.  Psychiatric:        Mood and Affect: Mood normal.        Behavior: Behavior normal.  Scheduled Medications:    stroke: early stages of recovery book   Does not apply Once   amitriptyline  10 mg Oral QHS   ascorbic acid  250 mg Oral BID   diltiazem  180 mg Oral Daily   finasteride  5 mg Oral Daily   gabapentin  300 mg Oral TID   metoprolol succinate  50 mg Oral Daily   tamsulosin  0.4 mg Oral BID AC   traZODone  50 mg Oral QHS   umeclidinium bromide  1 puff Inhalation Daily    Continuous Infusions:    PRN Medications:  acetaminophen **OR** acetaminophen (TYLENOL) oral liquid 160 mg/5 mL **OR** acetaminophen, albuterol, lidocaine, nicotine, oxyCODONE-acetaminophen, senna-docusate  Antimicrobials from admission:  Anti-infectives (From admission, onward)    Start     Dose/Rate Route Frequency Ordered Stop   10/29/23 1200  ceFAZolin (ANCEF) IVPB 2g/100 mL premix        2 g 200 mL/hr over 30 Minutes Intravenous 30 min pre-op 10/28/23 0935 10/29/23 1208           Data Reviewed:  I have personally reviewed the following...  CBC: Recent Labs  Lab 10/25/23 0607 10/26/23 0454  WBC 9.5 7.4  HGB 11.2*  10.3*  HCT 33.5* 30.9*  MCV 95.7 97.2  PLT 193 155   Basic Metabolic Panel: Recent Labs  Lab 10/25/23 0607 10/26/23 0454 10/27/23 0521 10/28/23 0452 10/29/23 0521  NA 137 135 136 138 136  K 3.9 3.9 3.9 3.4* 3.8  CL 106 105 106 108 104  CO2 24 18* 19* 23 24  GLUCOSE 100* 67* 61* 96 92  BUN 19 21* 26* 25* 17  CREATININE 1.51* 1.39* 1.41* 1.38* 1.30*  CALCIUM 9.3 8.6* 8.7* 8.7* 8.7*   GFR: Estimated Creatinine Clearance: 41.5 mL/min (A) (by C-G formula based on SCr of 1.3 mg/dL (H)). Liver Function Tests: No results for input(s): "AST", "ALT", "ALKPHOS", "BILITOT", "PROT", "ALBUMIN" in the last 168 hours. No results for input(s): "LIPASE", "AMYLASE" in the last 168 hours. No results for input(s): "AMMONIA" in the last 168 hours. Coagulation Profile: No results for input(s): "INR", "PROTIME" in the last 168 hours. Cardiac Enzymes: Recent Labs  Lab 10/27/23 0521  CKTOTAL 44*   BNP (last 3 results) No results for input(s): "PROBNP" in the last 8760 hours. HbA1C: No results for input(s): "HGBA1C" in the last 72 hours.  CBG: No results for input(s): "GLUCAP" in the last 168 hours. Lipid Profile: No results for input(s): "CHOL", "HDL", "LDLCALC", "TRIG", "CHOLHDL", "LDLDIRECT" in the last 72 hours.  Thyroid Function Tests: No results for input(s): "TSH", "T4TOTAL", "FREET4", "T3FREE", "THYROIDAB" in the last 72 hours. Anemia Panel: No results for input(s): "VITAMINB12", "FOLATE", "FERRITIN", "TIBC", "IRON", "RETICCTPCT" in the last 72 hours. Most Recent Urinalysis On File:     Component Value Date/Time   COLORURINE YELLOW (A) 10/25/2023 1802   APPEARANCEUR CLEAR (A) 10/25/2023 1802   APPEARANCEUR Clear 11/18/2011 1958   LABSPEC 1.023 10/25/2023 1802   LABSPEC 1.002 11/18/2011 1958   PHURINE 5.0 10/25/2023 1802   GLUCOSEU NEGATIVE 10/25/2023 1802   GLUCOSEU Negative 11/18/2011 1958   HGBUR NEGATIVE 10/25/2023 1802   BILIRUBINUR NEGATIVE 10/25/2023 1802    BILIRUBINUR Negative 11/18/2011 1958   KETONESUR 5 (A) 10/25/2023 1802   PROTEINUR NEGATIVE 10/25/2023 1802   NITRITE NEGATIVE 10/25/2023 1802   LEUKOCYTESUR NEGATIVE 10/25/2023 1802   LEUKOCYTESUR Negative 11/18/2011 1958   Sepsis Labs: @LABRCNTIP (procalcitonin:4,lacticidven:4) Microbiology: Recent Results (from the past 240 hours)  Resp  panel by RT-PCR (RSV, Flu A&B, Covid) Anterior Nasal Swab     Status: None   Collection Time: 10/25/23  2:41 PM   Specimen: Anterior Nasal Swab  Result Value Ref Range Status   SARS Coronavirus 2 by RT PCR NEGATIVE NEGATIVE Final    Comment: (NOTE) SARS-CoV-2 target nucleic acids are NOT DETECTED.  The SARS-CoV-2 RNA is generally detectable in upper respiratory specimens during the acute phase of infection. The lowest concentration of SARS-CoV-2 viral copies this assay can detect is 138 copies/mL. A negative result does not preclude SARS-Cov-2 infection and should not be used as the sole basis for treatment or other patient management decisions. A negative result may occur with  improper specimen collection/handling, submission of specimen other than nasopharyngeal swab, presence of viral mutation(s) within the areas targeted by this assay, and inadequate number of viral copies(<138 copies/mL). A negative result must be combined with clinical observations, patient history, and epidemiological information. The expected result is Negative.  Fact Sheet for Patients:  BloggerCourse.com  Fact Sheet for Healthcare Providers:  SeriousBroker.it  This test is no t yet approved or cleared by the [place] FDA and  has been authorized for detection and/or diagnosis of SARS-CoV-2 by FDA under an Emergency Use Authorization (EUA). This EUA will remain  in effect (meaning this test can be used) for the duration of the COVID-19 declaration under Section 564(b)(1) of the Act, 21 U.S.C.section  360bbb-3(b)(1), unless the authorization is terminated  or revoked sooner.       Influenza A by PCR NEGATIVE NEGATIVE Final   Influenza B by PCR NEGATIVE NEGATIVE Final    Comment: (NOTE) The Xpert Xpress SARS-CoV-2/FLU/RSV plus assay is intended as an aid in the diagnosis of influenza from Nasopharyngeal swab specimens and should not be used as a sole basis for treatment. Nasal washings and aspirates are unacceptable for Xpert Xpress SARS-CoV-2/FLU/RSV testing.  Fact Sheet for Patients: BloggerCourse.com  Fact Sheet for Healthcare Providers: SeriousBroker.it  This test is not yet approved or cleared by the [place] FDA and has been authorized for detection and/or diagnosis of SARS-CoV-2 by FDA under an Emergency Use Authorization (EUA). This EUA will remain in effect (meaning this test can be used) for the duration of the COVID-19 declaration under Section 564(b)(1) of the Act, 21 U.S.C. section 360bbb-3(b)(1), unless the authorization is terminated or revoked.     Resp Syncytial Virus by PCR NEGATIVE NEGATIVE Final    Comment: (NOTE) Fact Sheet for Patients: BloggerCourse.com  Fact Sheet for Healthcare Providers: SeriousBroker.it  This test is not yet approved or cleared by the [place] FDA and has been authorized for detection and/or diagnosis of SARS-CoV-2 by FDA under an Emergency Use Authorization (EUA). This EUA will remain in effect (meaning this test can be used) for the duration of the COVID-19 declaration under Section 564(b)(1) of the Act, 21 U.S.C. section 360bbb-3(b)(1), unless the authorization is terminated or revoked.  Performed at [facility], [address] [place], [place] [address]   MRSA Next Gen by PCR, Nasal     Status: None   Collection Time: 10/26/23 12:47 PM   Specimen: Nasal Mucosa; Nasal Swab  Result Value Ref Range  Status   MRSA by PCR Next Gen NOT DETECTED NOT DETECTED Final    Comment: (NOTE) The GeneXpert MRSA Assay (FDA approved for NASAL specimens only), is one component of a comprehensive MRSA colonization surveillance program. It is not intended to diagnose MRSA infection nor to guide or  monitor treatment for MRSA infections. Test performance is not FDA approved in patients less than 27 years old. Performed at [facility], [address], [place], [place] [address]       Radiology Studies last 3 days: DG C-Arm 1-60 Min-No Report Result Date: 10/29/2023 Fluoroscopy was utilized by the requesting physician.  No radiographic interpretation.   MR FEMUR LEFT W WO CONTRAST Result Date: 10/27/2023 CLINICAL DATA:  Left leg pain and weakness primarily involving the thigh. Fell yesterday. Assess for myopathy. EXAM: MR OF THE LEFT LOWER EXTREMITY WITHOUT AND WITH CONTRAST TECHNIQUE: Multiplanar, multisequence MR imaging of the left thigh was performed both before and after administration of intravenous contrast. CONTRAST:  5mL GADAVIST GADOBUTROL 1 MMOL/ML IV SOLN COMPARISON:  None Available. FINDINGS: Bones/Joint/Cartilage Acute nondisplaced left intertrochanteric femur fracture. No dislocation. Joint spaces are preserved. Small left hip joint effusion. Muscles and Tendons Prominent edema in the proximal left adductor muscles and proximal vastus medialis muscle. No associated enhancement. No significant muscle atrophy. Soft tissue No fluid collection or hematoma.  No soft tissue mass. IMPRESSION: 1. Acute nondisplaced left intertrochanteric femur fracture. 2. Prominent edema in the proximal left adductor muscles and proximal vastus medialis muscle, likely strains/post-traumatic. No associated enhancement to suggest myositis. Electronically Signed   By: Obie Dredge M.D.   On: 10/27/2023 16:57   CT PELVIS W CONTRAST Result Date: 10/27/2023 CLINICAL DATA:  Left leg pain and weakness. Primarily  involves the thigh. Assess for left iliopsoas pathology. EXAM: CT PELVIS WITH CONTRAST TECHNIQUE: Multidetector CT imaging of the pelvis was performed using the standard protocol following the bolus administration of intravenous contrast. RADIATION DOSE REDUCTION: This exam was performed according to the departmental dose-optimization program which includes automated exposure control, adjustment of the mA and/or kV according to patient size and/or use of iterative reconstruction technique. CONTRAST:  OMNIPAQUE IOHEXOL 300 MG/ML  SOLN COMPARISON:  CT abdomen pelvis dated September 18, 2011. FINDINGS: Urinary Tract:  No abnormality visualized. Bowel:  Small stool ball in the rectum.  Otherwise unremarkable. Vascular/Lymphatic: Kissing bilateral common iliac and external iliac stents are identified. There is significant in stent stenosis of the bilateral common iliac arteries with reduced flow, but no definite stent occlusion, although evaluation is limited by phase of contrast. No enlarged pelvic lymph nodes. Reproductive:  Normal prostate gland. Other:  None. Musculoskeletal: No acute or significant findings. Specifically, the left iliopsoas muscles are unremarkable. IMPRESSION: 1. No acute abnormality. Specifically, the left iliopsoas muscles are unremarkable. 2. Kissing bilateral common iliac and external iliac stents are identified. There is significant in stent stenosis of the bilateral common iliac arteries with reduced flow, but no definite stent occlusion, although evaluation is limited by phase of contrast. If there is clinical concern for arterial thrombosis, recommend dedicated CTA of the abdomen and pelvis for further evaluation. Electronically Signed   By: Obie Dredge M.D.   On: 10/27/2023 12:48   DG HIP UNILAT WITH PELVIS 2-3 VIEWS LEFT Result Date: 10/26/2023 CLINICAL DATA:  Left hip pain after injury yesterday EXAM: DG HIP (WITH OR WITHOUT PELVIS) 2-3V LEFT COMPARISON:  None Available.  FINDINGS: There is no evidence of hip fracture or dislocation. Mild osteophyte formation of the left hip is noted. 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 No acute abnormality seen. Electronically Signed   By: Lupita Raider M.D.   On: 10/26/2023 17:20   MR BRAIN WO CONTRAST Result Date: 10/25/2023 CLINICAL DATA:  Neuro deficit, acute, stroke suspected. Left  leg weakness beginning yesterday. EXAM: MRI HEAD WITHOUT CONTRAST TECHNIQUE: Multiplanar, multiecho pulse sequences of the brain and surrounding structures were obtained without intravenous contrast. COMPARISON:  Head CT same day.  MRI 03/22/2021 FINDINGS: Brain: Diffusion imaging does not show any acute or subacute infarction or other cause of restricted diffusion. Chronic small-vessel ischemic changes affect the pons. No focal cerebellar insult. There are numerous old small vessel infarctions within both thalami. Extensive chronic small-vessel ischemic changes present throughout the hemispheric white matter. No large vessel territory stroke. No mass, hemorrhage, hydrocephalus or extra-axial collection. Compared to the study of 2020, findings appear quite similar. Vascular: Major vessels at the base of the brain show flow. Skull and upper cervical spine: Negative Sinuses/Orbits: Mild seasonal mucosal thickening. No advanced sinus disease. Orbits negative. Other: None IMPRESSION: No acute finding by MRI. Extensive chronic small-vessel ischemic changes of the pons, thalami and hemispheric white matter. No change since 2020. Electronically Signed   By: Paulina Fusi M.D.   On: 10/25/2023 18:21   US Venous Img Lower Unilateral Left (DVT) Result Date: 10/25/2023 CLINICAL DATA:  Left lower extremity pain. EXAM: LEFT LOWER EXTREMITY VENOUS DOPPLER ULTRASOUND TECHNIQUE: Gray-scale sonography with graded compression, as well as color Doppler and duplex ultrasound were performed to evaluate the lower extremity deep venous systems from the level of  the common femoral vein and including the common femoral, femoral, profunda femoral, popliteal and calf veins including the posterior tibial, peroneal and gastrocnemius veins when visible. The superficial great saphenous vein was also interrogated. Spectral Doppler was utilized to evaluate flow at rest and with distal augmentation maneuvers in the common femoral, femoral and popliteal veins. COMPARISON:  None Available. FINDINGS: Contralateral Common Femoral Vein: Respiratory phasicity is normal and symmetric with the symptomatic side. No evidence of thrombus. Normal compressibility. Common Femoral Vein: No evidence of thrombus. Normal compressibility, respiratory phasicity and response to augmentation. Saphenofemoral Junction: No evidence of thrombus. Normal compressibility and flow on color Doppler imaging. Profunda Femoral Vein: No evidence of thrombus. Normal compressibility and flow on color Doppler imaging. Femoral Vein: No evidence of thrombus. Normal compressibility, respiratory phasicity and response to augmentation. Popliteal Vein: No evidence of thrombus. Normal compressibility, respiratory phasicity and response to augmentation. Calf Veins: No evidence of thrombus. Normal compressibility and flow on color Doppler imaging. Superficial Great Saphenous Vein: No evidence of thrombus. Normal compressibility. Venous Reflux:  None. Other Findings: No evidence of superficial thrombophlebitis or abnormal fluid collection. IMPRESSION: No evidence of left lower extremity deep venous thrombosis. Electronically Signed   By: Irish Lack M.D.   On: 10/25/2023 17:03   DG Chest Port 1 View Result Date: 10/25/2023 CLINICAL DATA:  Shortness of breath with exertion.  Weakness. EXAM: PORTABLE CHEST 1 VIEW COMPARISON:  05/31/2023 FINDINGS: Heart size is normal. Chronic aortic atherosclerosis is noted. Chronic pulmonary scarring, most prominent in the left lower lobe. Findings appear similar to the study of September.  Cannot rule out some coexistence left base pneumonia, but most if not all the findings in this area appear chronic. No pleural effusion. No acute bone finding. IMPRESSION: Chronic pulmonary scarring, most prominent in the left lower lobe. Cannot exclude some coexistent left base pneumonia, but most if not all the findings in this area appear chronic. Electronically Signed   By: Paulina Fusi M.D.   On: 10/25/2023 15:09         Sunnie Nielsen, DO [facility] 10/29/2023, 2:34 PM    Dictation software may have been used to generate the  above note. Typos may occur and escape review in typed/dictated notes. Please contact Dr Lyn Hollingshead directly for clarity if needed.  Staff may message me via secure chat in Epic  but this may not receive an immediate response,  please page me for urgent matters!  If 7PM-7AM, please contact night coverage [email or web address]

## 2023-10-29 NOTE — Anesthesia Procedure Notes (Signed)
 Procedure Name: Intubation Date/Time: 10/29/2023 11:51 AM  Performed by: Rich Brave, CRNAPre-anesthesia Checklist: Patient identified, Emergency Drugs available, Suction available, Patient being monitored and Timeout performed Patient Re-evaluated:Patient Re-evaluated prior to induction Oxygen Delivery Method: Circle system utilized Preoxygenation: Pre-oxygenation with 100% oxygen Induction Type: IV induction Ventilation: Mask ventilation without difficulty and Oral airway inserted - appropriate to patient size Laryngoscope Size: McGrath and 4 Grade View: Grade I Tube type: Oral Tube size: 7.0 mm Number of attempts: 1 Airway Equipment and Method: Stylet and Video-laryngoscopy Placement Confirmation: ETT inserted through vocal cords under direct vision, positive ETCO2 and breath sounds checked- equal and bilateral Secured at: 20 cm Tube secured with: Tape Dental Injury: Teeth and Oropharynx as per pre-operative assessment

## 2023-10-29 NOTE — Op Note (Signed)
 DATE OF SURGERY: 10/29/2023  PREOPERATIVE DIAGNOSIS: Left intertrochanteric hip fracture  POSTOPERATIVE DIAGNOSIS: Left intertrochanteric hip fracture  PROCEDURE: Intramedullary nailing of Left femur with cephalomedullary device  SURGEON: Rosealee Albee, MD  ANESTHESIA: spinal  EBL: 100 cc  IVF: per anesthesia record  COMPONENTS:  [employer] Trigen Intertan Short Nail: 10x116mm; lag screw with compression screw; 5x 30mm distal cortical interlocking screw  INDICATIONS: Donald [name]. is a 63 y.o. male who sustained an intertrochanteric fracture after a fall. Risks and benefits of intramedullary nailing were explained to the patient and/or family . Risks include but are not limited to bleeding, infection, injury to tissues, nerves, vessels, nonunion/malunion, hardware failure, limb length discrepancy/hip rotation mismatch and risks of anesthesia. The patient and/or family understand these risks, have completed an informed consent, and wish to proceed.   PROCEDURE:  The patient was brought into the operating room. After administering anesthesia, the patient was placed in the supine position on the Hana table. The uninjured leg was placed in an extended position while the injured lower extremity was placed in a neutral position. The fracture was confirmed to be non-displaced with fluoroscopy, in accordance with preoperative imaging. The lateral aspect of the hip and thigh were prepped with ChloraPrep solution before being draped sterilely. Preoperative IV antibiotics were administered. A timeout was performed to verify the appropriate surgical site, patient, and procedure.    The greater trochanter was identified and an approximately 6 cm incision was made about 3 fingerbreadths above the tip of the greater trochanter. The incision was carried down through the subcutaneous tissues to expose the gluteal fascia. This was split the length of the incision, providing access to the  tip of the trochanter. Under fluoroscopic guidance, a guidewire was drilled through the tip of the trochanter into the proximal metaphysis to the level of the lesser trochanter. After verifying its position fluoroscopically in AP and lateral projections, it was overreamed with the opening reamer to the level of the lesser trochanter. The nail was selected and advanced to the appropriate depth as verified fluoroscopically.    The guide system for the lag screw was positioned and advanced through an approximately 5cm incision over the lateral aspect of the proximal femur. The guidewire was drilled up through the femoral nail and into the femoral neck to rest within 5 mm of subchondral bone. After verifying its position in the femoral neck and head in both AP and lateral projections, the guidewire was measured and appropriate sized lag screw was selected.  The channel for the compression screw was drilled and antirotation bar was placed.  Lag screw was drilled and placed in appropriate position.  Compression screw was then placed. Minimal compression was performed to ensure the screw was engaged.  The set screw was locked in place. Again, the adequacy of hardware position and fracture reduction was verified fluoroscopically in AP and lateral projections.   Attention was then turned to the distal interlocking screw in the diaphysis. Using a targeted assembly, a stab incision was made and hole was drilled through the nail. An interlocking screw was placed with excellent purchase.  Appropriate screw position was verified fluoroscopically in AP and lateral projections.   The wounds were irrigated thoroughly with sterile saline solution. Local anesthetic was injected into the wounds. Deep fascia was closed with 0-Vicryl. The subcutaneous tissues were closed using 2-0 Vicryl interrupted sutures. The skin was closed using staples. Sterile occlusive dressings were applied to all wounds. The patient  was then transferred  to the recovery room in satisfactory condition.   POSTOPERATIVE PLAN: The patient will be WBAT on the operative extremity. Resume baseline anticoagulation on POD#1. Perioperative IV antibiotics x 24 hours. PT/OT on POD#1.

## 2023-10-29 NOTE — Transfer of Care (Signed)
 Immediate Anesthesia Transfer of Care Note  Patient: Donald [name].  Procedure(s) Performed: INTRAMEDULLARY (IM) NAIL INTERTROCHANTERIC (Left: Hip)  Patient Location: PACU  Anesthesia Type:General  Level of Consciousness: awake, alert , oriented, and patient cooperative  Airway & Oxygen Therapy: Patient Spontanous Breathing and Patient connected to face mask oxygen  Post-op Assessment: Report given to RN and Post -op Vital signs reviewed and stable  Post vital signs: Reviewed and stable  Last Vitals:  Vitals Value Taken Time  BP 165/98 10/29/23 1305  Temp 98    Pulse 103 10/29/23 1311  Resp 14 10/29/23 1311  SpO2 100 % 10/29/23 1311  Vitals shown include unfiled device data.  Last Pain:  Vitals:   10/29/23 1057  TempSrc: Temporal  PainSc:       Patients Stated Pain Goal: 2 (10/29/23 1057)  Complications: No notable events documented.

## 2023-10-29 NOTE — Anesthesia Postprocedure Evaluation (Signed)
 Anesthesia Post Note  Patient: [name].  Procedure(s) Performed: INTRAMEDULLARY (IM) NAIL INTERTROCHANTERIC (Left: Hip)  Patient location during evaluation: PACU Anesthesia Type: General Level of consciousness: awake and alert Pain management: pain level controlled Vital Signs Assessment: post-procedure vital signs reviewed and stable Respiratory status: spontaneous breathing, nonlabored ventilation, respiratory function stable and patient connected to nasal cannula oxygen Cardiovascular status: blood pressure returned to baseline and stable Postop Assessment: no apparent nausea or vomiting Anesthetic complications: no   No notable events documented.   Last Vitals:  Vitals:   10/29/23 1330 10/29/23 1341  BP: (!) 157/90 (!) 158/91  Pulse: (!) 115 (!) 108  Resp: 11 10  Temp:  36.9 C  SpO2: 90% 92%    Last Pain:  Vitals:   10/29/23 1315  TempSrc:   PainSc: 0-No pain                 [name]

## 2023-10-30 DIAGNOSIS — R29898 Other symptoms and signs involving the musculoskeletal system: Secondary | ICD-10-CM | POA: Diagnosis not present

## 2023-10-30 LAB — CBC
HCT: 24.6 % — ABNORMAL LOW (ref 39.0–52.0)
Hemoglobin: 8.3 g/dL — ABNORMAL LOW (ref 13.0–17.0)
MCH: 31.9 pg (ref 26.0–34.0)
MCHC: 33.7 g/dL (ref 30.0–36.0)
MCV: 94.6 fL (ref 80.0–100.0)
Platelets: 163 10*3/uL (ref 150–400)
RBC: 2.6 MIL/uL — ABNORMAL LOW (ref 4.22–5.81)
RDW: 13.2 % (ref 11.5–15.5)
WBC: 8.3 10*3/uL (ref 4.0–10.5)
nRBC: 0 % (ref 0.0–0.2)

## 2023-10-30 LAB — BASIC METABOLIC PANEL
Anion gap: 8 (ref 5–15)
BUN: 22 mg/dL — ABNORMAL HIGH (ref 6–20)
CO2: 23 mmol/L (ref 22–32)
Calcium: 8.6 mg/dL — ABNORMAL LOW (ref 8.9–10.3)
Chloride: 105 mmol/L (ref 98–111)
Creatinine, Ser: 1.57 mg/dL — ABNORMAL HIGH (ref 0.61–1.24)
GFR, Estimated: 50 mL/min — ABNORMAL LOW (ref >60)
Glucose, Bld: 127 mg/dL — ABNORMAL HIGH (ref 70–99)
Potassium: 4.5 mmol/L (ref 3.5–5.1)
Sodium: 136 mmol/L (ref 135–145)

## 2023-10-30 MED ORDER — NICOTINE 21 MG/24HR TD PT24: 21.0000 mg | MEDICATED_PATCH | Freq: Every day | TRANSDERMAL | Status: DC | PRN

## 2023-10-30 MED ORDER — ACETAMINOPHEN 500 MG PO TABS
1000.0000 mg | ORAL_TABLET | Freq: Three times a day (TID) | ORAL | Status: AC
Start: 2023-10-30 — End: ?

## 2023-10-30 MED ORDER — FE FUM-VIT C-VIT B12-FA 460-60-0.01-1 MG PO CAPS
1.0000 | ORAL_CAPSULE | Freq: Every day | ORAL | Status: DC
  Administered 2023-10-30 – 2023-11-01 (×3): 1 via ORAL
  Filled 2023-10-30 (×3): qty 1

## 2023-10-30 MED ORDER — OXYCODONE HCL 5 MG PO TABS: 2.5000 mg | ORAL_TABLET | ORAL | 0 refills | Status: DC | PRN

## 2023-10-30 MED ORDER — VITAMIN D3 25 MCG PO TABS: 1000.0000 [IU] | ORAL_TABLET | Freq: Every day | ORAL | 0 refills | Status: AC

## 2023-10-30 NOTE — TOC Progression Note (Addendum)
 Transition of Care (TOC) - Progression Note    Patient Details  Name: Donald [name]. MRN: [id number] Date of Birth: Mar 06, 1961  Transition of Care [facility]) CM/SW Contact  Bing Quarry, RN Phone Number: 10/30/2023, 11:07 AM  Clinical Narrative:  10/30/23: Patient is POD #1 for  Procedure(s) (LRB): INTRAMEDULLARY (IM) NAIL INTERTROCHANTERIC (Left). Patient is from [facility] in [place], [place]. Left VM for Tammy McKinney at ALF.   1220: Spoke with Tammy at the ALF, due to surgery and that Medicaid/[employer] insurance will not pay for [facility] therapy and they do not have inhouse therapy, she will need to come assess on Monday as to whether they can provide level of care needed and asked that PT see patient between now and then as much as possible to facilitate progress or may need to look at STR. Updated provider.  Faxed DC Summary and therapy notes to Tammy at [phone] at 1255 pm. 10/30/23.    Gabriel Cirri MSN RN CM  RN Case Manager [place]  Transitions of Care Direct Dial: [phone] (Weekends Only) [facility] Main Office Phone: [phone] [facility] Fax: [phone] [email or web address]   Expected Discharge Plan and Services  Expected Discharge Date: 10/30/23                Social Determinants of Health (SDOH) Interventions SDOH Screenings   Food Insecurity: No Food Insecurity (10/26/2023)  Housing: Low Risk  (10/26/2023)  Transportation Needs: No Transportation Needs (10/26/2023)  Utilities: Not At Risk (10/26/2023)  Depression (PHQ2-9): Low Risk  (02/28/2019)  Financial Resource Strain: Low Risk  (10/12/2023)   Received from [facility]  Physical Activity: Unknown (03/13/2019)  Social Connections: Unknown (10/26/2023)  Tobacco Use: Medium Risk (10/29/2023)    Readmission Risk Interventions     No data to display

## 2023-10-30 NOTE — Plan of Care (Signed)

## 2023-10-30 NOTE — Progress Notes (Signed)
 Discharge cancelled - facility is not taking him back today, they will come out Monday to evaluate.

## 2023-10-30 NOTE — Discharge Summary (Signed)
 Physician Discharge Summary   Patient: Donald [name]. MRN: [id number]  DOB: [birth year]   Admit:     Date of Admission: 10/25/2023 Admitted from: home/assisted living    Discharge: Date of discharge: 10/30/23 Disposition: Assisted living Condition at discharge: good  CODE STATUS: DNR      Discharge Physician: Donald Nielsen, DO [facility]     PCP: Donald Lambert, NP  Recommendations for Outpatient Follow-up:  Follow up with PCP Donald Lambert, NP in 4-6 weeks - likely osteoporosis, follow vitamin D levels. Adjusted BP meds, restart/adjust further as needed. Will need LLE weakness workup if still persisting despite healing fracture, consider neurology f/u.  Follow up with orthopedics in 2 weeks     Discharge Instructions     Diet - low sodium heart healthy   Complete by: As directed    Increase activity slowly   Complete by: As directed          Discharge Diagnoses: Principal Problem:   Left leg weakness Active Problems:   DTs (delirium tremens) (HCC)   Essential hypertension   Benign prostatic hyperplasia   PAD (peripheral artery disease) (HCC)   Peripheral neuropathy   Alcohol dependence, in remission (HCC)   Mononeuropathy of both lower extremities   Benign neoplasm of transverse colon   Insomnia   Protein-calorie malnutrition, severe   Acute CVA (cerebrovascular accident) (HCC)   Atherosclerosis of native arteries of the extremities with gangrene (HCC)   Left leg pain   Tobacco use   CKD stage 3a, GFR 45-59 ml/min (HCC)   COPD (chronic obstructive pulmonary disease) [facility])        Hospital course / significant events:   HPI:  [name], is a 63 year old male with history of hyperlipidemia, hypertension, neuropathy, BPH, PAD, CKD 3A, moderate COPD, cigarette/tobacco user, history of CVA, insomnia, alcohol dependence with history of delirium tremens, who presents to the emergency department for chief concerns of left  leg weakness. He reports that at baseline he ambulates without a walker. He endorses that he does live at assisted living due to having had 8 strokes in the past. He reports that yesterday, he fell because he was opening the door to his room when he suddenly felt left leg weakness, his left leg giving out on him and he fell. He denies loss of consciousness. He states that the weakness of his LLE primarily involves his thigh, with difficulty flexing his leg at the hip, but also with significant weakness of leg extension. He denies sensory loss in either of his lower extremities.   02/17: admitted to hospitalist service w/ concern for TIA/CVA. MRI brain showed nothing acute, (+)extensive chronic small vessel eischemic changes. LLE [place] Neg CVT. MRI L-spine (+)degenerative changes w/o high grade canal/foraminal stenosis  02/18: XR hip (+)degenerative joint disease no fracture/dislocation, Neurology saw pt - "Exam reveals focal weakness of his LLE, significantly worse proximally with 2-3/5 hip flexion, 3/5 hip adduction, 4-/5 knee extension and flexion, 4/5 ADF and APF." "DDx for focal proximal > distal LLE weakness... Includes lumbosacral plexopathy, focal myopathy, iliopsoas focal lesion such as a hematoma or mass, and focal ischemic nerve injury of the femoral nerve... Per literature search, intermittent claudication is one cause of hip and thigh pain with weakness of hip flexion." Recs for MRI L thigh to assess for myopathy, which was ordered but not done this day. CT pelvis advised. CK level not collected. Hold statin. Consider vascular surgery consult/followup.  02/19: MRI L thigh/femur pending read. CT no iliopsoas abn but is showing some occlusive disease in bilateral common iliac and external iliac stents - I've asked vascular to weigh in on whether or to what extent this might be causing symptoms.  02/20: MRI (+)L femur fx, ortho to take to OR tomorrow. Vacsular saw pt, no concerns.  02/21: to OR for nail  fixation L femur 02/22: stable, did well w/ PT ok for discharge      Consultants:  Neurology  Vascular Surgery Orthopedic Surgery   Procedures/Surgeries: 10/29/23 left intramedullary intertrochanteric nail placement - Dr Donald [name]      ASSESSMENT & PLAN:   Acute nondisplaced left intertrochanteric femur fracture.  Potential for LLR weakness (see below) led to fall and still need w/u Ortho following - outpatient in 2 weeks  PT/OT to follow after surgery / per ortho  Left leg weakness Have reasonably ruled out CVA, lumbar disc disease / nerve compression Ddx Includes lumbosacral plexopathy, focal myopathy, iliopsoas focal lesion such as a hematoma or mass, focal ischemic nerve injury of the femoral nerve, intermittent claudication  Neurology has s/o  We were holding statin but can restart now  Consider outpatient Neurology referral for EMG/NCS if postop course still demonstrating significant weakness    PAD Normal ABIs last month and palpable distal pulses in left leg, do not think left leg dysfunction is 2/2 arterial disease Restart statin  restarted apixaban  Vascular consult to comment on posisbility claudication may be causing weakness --> no intervention, follow routinely outpatient, suspect neuropathy more a contributor, less so any claudication problem   History CVA MRI brain neg for acute stroke held statin as above but can restart  held apixabana s above but can restart - lower dose w/ fall risk    HTN resume home metop and dilt BP has been normal range, held hydralazine and losartan - restart/adjust per PCP    CKD 3a Kidney function is at baseline. Bicarb low today from normal yesterday repeat bmp outpatient    Insomnia home amitryptyline   BPH home proscar, flomax   COPD Quiescent resume home spiriva   Neuropathy home gabapentin - reduced dose per pharmacy recs given CrCl and fall risk    GAD home meds  History alcoholism Reports last  drink 5 years ago No intervention     underweight based on BMI: Body mass index is 16.27 kg/m.  Underweight - under 18  overweight - 25 to 29 obese - 30 or more Class 1 obesity: BMI of 30.0 to 34 Class 2 obesity: BMI of 35.0 to 39 Class 3 obesity: BMI of 40.0 to 49 Super Morbid Obesity: BMI 50-59 Super-super Morbid Obesity: BMI 60+ Significantly low or high BMI is associated with higher medical risk.  Weight management advised as adjunct to other disease management and risk reduction treatments          Discharge Instructions  Allergies as of 10/30/2023       Reactions   Lidocaine Other (See Comments)   unknown        Medication List     STOP taking these medications    hydrALAZINE 50 MG tablet Commonly known as: APRESOLINE   lidocaine 5 % Commonly known as: Lidoderm   losartan 25 MG tablet Commonly known as: COZAAR       TAKE these medications    acetaminophen 500 MG tablet Commonly known as: TYLENOL Take 2 tablets (1,000 mg total) by mouth every 8 (eight) hours.  What changed:  how much to take when to take this reasons to take this   albuterol 108 (90 Base) MCG/ACT inhaler Commonly known as: VENTOLIN HFA Inhale 2 puffs into the lungs every 6 (six) hours as needed for wheezing or shortness of breath.   amitriptyline 10 MG tablet Commonly known as: ELAVIL Take 10 mg by mouth at bedtime.   ascorbic acid 250 MG tablet Commonly known as: VITAMIN C Take 1 tablet (250 mg total) by mouth 2 (two) times daily.   atorvastatin 10 MG tablet Commonly known as: LIPITOR Take 10 mg by mouth daily.   diltiazem 240 MG 24 hr capsule Commonly known as: CARDIZEM CD Take 1 capsule (240 mg total) by mouth daily.   docusate sodium 100 MG capsule Commonly known as: COLACE Take 100 mg by mouth daily.   Eliquis 2.5 MG Tabs tablet Generic drug: apixaban Take 2.5 mg by mouth 2 (two) times daily. What changed: Another medication with the same name was  removed. Continue taking this medication, and follow the directions you see here.   ferrous sulfate 325 (65 FE) MG EC tablet Take 325 mg by mouth daily at 6 (six) AM.   finasteride 5 MG tablet Commonly known as: PROSCAR Take 1 tablet (5 mg total) by mouth daily.   gabapentin 400 MG capsule Commonly known as: NEURONTIN Take 400 mg by mouth 3 (three) times daily. Take in addition to one 100 mg capsule for total 500 mg three times daily What changed: Another medication with the same name was removed. Continue taking this medication, and follow the directions you see here.   magnesium oxide 400 (240 Mg) MG tablet Commonly known as: MAG-OX Take 400 mg by mouth daily.   melatonin 5 MG Tabs Take 10 mg by mouth at bedtime.   methocarbamol 500 MG tablet Commonly known as: ROBAXIN Take 500 mg by mouth every 8 (eight) hours as needed for muscle spasms.   metoprolol succinate 50 MG 24 hr tablet Commonly known as: TOPROL-XL Take 2 tablets (100 mg total) by mouth daily. What changed: how much to take   multivitamin with minerals tablet Take 1 tablet by mouth daily.   naproxen 500 MG tablet Commonly known as: NAPROSYN Take 500 mg by mouth 2 (two) times daily.   nicotine 21 mg/24hr patch Commonly known as: NICODERM CQ - dosed in mg/24 hours Place 1 patch (21 mg total) onto the skin daily as needed (nicotine craving).   oxyCODONE 5 MG immediate release tablet Commonly known as: Oxy IR/ROXICODONE Take 0.5-1 tablets (2.5-5 mg total) by mouth every 4 (four) hours as needed for moderate pain (pain score 4-6) (pain score 4-6).   oxyCODONE-acetaminophen 10-325 MG tablet Commonly known as: PERCOCET Take 1 tablet by mouth every 6 (six) hours as needed.   Refresh Digital 0.5-1-0.5 % Soln Generic drug: Carboxymeth-Glycerin-Polysorb Apply to eye.   Spiriva Respimat 2.5 MCG/ACT Aers Generic drug: Tiotropium Bromide Monohydrate Inhale 2 puffs into the lungs daily. What changed: Another  medication with the same name was removed. Continue taking this medication, and follow the directions you see here.   tamsulosin 0.4 MG Caps capsule Commonly known as: FLOMAX Take 1 capsule (0.4 mg total) by mouth daily after supper. What changed: when to take this   traZODone 50 MG tablet Commonly known as: DESYREL Take 50 mg by mouth at bedtime.   vitamin D3 25 MCG tablet Commonly known as: CHOLECALCIFEROL Take 1 tablet (1,000 Units total) by mouth daily.  Follow-up Information     Dedra Skeens, PA-C Follow up in 2 week(s).   Specialty: Orthopedic Surgery Contact information: [address] [place] [address] [phone]         Donald Lambert, NP Follow up.   Specialty: Adult Health Nurse Practitioner Why: hospital follow up in 4-6 weeks Contact information: [address] [place] [address] [phone]                 Allergies  Allergen Reactions   Lidocaine Other (See Comments)    unknown     Subjective: pt reports feeling okay, operative/hip pain is okay, neuropathy is his biggest issue but it is stable / baseline. Tolerating diet. Ambulating.    Discharge Exam: BP 117/72 (BP Location: Right Arm)   Pulse 76   Temp (!) 97.4 F (36.3 C) (Oral)   Resp 18   Ht 5\' 8"  (1.727 m)   Wt 48.5 kg   SpO2 97%   BMI 16.27 kg/m  General: Pt is alert, awake, not in acute distress, thin/frail  Cardiovascular: RRR, S1/S2 Respiratory: CTA bilaterally, no wheezing, no rhonchi Abdominal: Soft, NT, ND Extremities: no edema, no cyanosis     The results of significant diagnostics from this hospitalization (including imaging, microbiology, ancillary and laboratory) are listed below for reference.     Microbiology: Recent Results (from the past 240 hours)  Resp panel by RT-PCR (RSV, Flu A&B, Covid) Anterior Nasal Swab     Status: None   Collection Time: 10/25/23  2:41 PM   Specimen:  Anterior Nasal Swab  Result Value Ref Range Status   SARS Coronavirus 2 by RT PCR NEGATIVE NEGATIVE Final    Comment: (NOTE) SARS-CoV-2 target nucleic acids are NOT DETECTED.  The SARS-CoV-2 RNA is generally detectable in upper respiratory specimens during the acute phase of infection. The lowest concentration of SARS-CoV-2 viral copies this assay can detect is 138 copies/mL. A negative result does not preclude SARS-Cov-2 infection and should not be used as the sole basis for treatment or other patient management decisions. A negative result may occur with  improper specimen collection/handling, submission of specimen other than nasopharyngeal swab, presence of viral mutation(s) within the areas targeted by this assay, and inadequate number of viral copies(<138 copies/mL). A negative result must be combined with clinical observations, patient history, and epidemiological information. The expected result is Negative.  Fact Sheet for Patients:  BloggerCourse.com  Fact Sheet for Healthcare Providers:  SeriousBroker.it  This test is no t yet approved or cleared by the [place] FDA and  has been authorized for detection and/or diagnosis of SARS-CoV-2 by FDA under an Emergency Use Authorization (EUA). This EUA will remain  in effect (meaning this test can be used) for the duration of the COVID-19 declaration under Section 564(b)(1) of the Act, 21 U.S.C.section 360bbb-3(b)(1), unless the authorization is terminated  or revoked sooner.       Influenza A by PCR NEGATIVE NEGATIVE Final   Influenza B by PCR NEGATIVE NEGATIVE Final    Comment: (NOTE) The Xpert Xpress SARS-CoV-2/FLU/RSV plus assay is intended as an aid in the diagnosis of influenza from Nasopharyngeal swab specimens and should not be used as a sole basis for treatment. Nasal washings and aspirates are unacceptable for Xpert Xpress SARS-CoV-2/FLU/RSV testing.  Fact  Sheet for Patients: BloggerCourse.com  Fact Sheet for Healthcare Providers: SeriousBroker.it  This test is not yet approved or cleared by the [place] FDA  and has been authorized for detection and/or diagnosis of SARS-CoV-2 by FDA under an Emergency Use Authorization (EUA). This EUA will remain in effect (meaning this test can be used) for the duration of the COVID-19 declaration under Section 564(b)(1) of the Act, 21 U.S.C. section 360bbb-3(b)(1), unless the authorization is terminated or revoked.     Resp Syncytial Virus by PCR NEGATIVE NEGATIVE Final    Comment: (NOTE) Fact Sheet for Patients: BloggerCourse.com  Fact Sheet for Healthcare Providers: SeriousBroker.it  This test is not yet approved or cleared by the [place] FDA and has been authorized for detection and/or diagnosis of SARS-CoV-2 by FDA under an Emergency Use Authorization (EUA). This EUA will remain in effect (meaning this test can be used) for the duration of the COVID-19 declaration under Section 564(b)(1) of the Act, 21 U.S.C. section 360bbb-3(b)(1), unless the authorization is terminated or revoked.  Performed at [facility], [address] [place], [place] [address]   MRSA Next Gen by PCR, Nasal     Status: None   Collection Time: 10/26/23 12:47 PM   Specimen: Nasal Mucosa; Nasal Swab  Result Value Ref Range Status   MRSA by PCR Next Gen NOT DETECTED NOT DETECTED Final    Comment: (NOTE) The GeneXpert MRSA Assay (FDA approved for NASAL specimens only), is one component of a comprehensive MRSA colonization surveillance program. It is not intended to diagnose MRSA infection nor to guide or monitor treatment for MRSA infections. Test performance is not FDA approved in patients less than 34 years old. Performed at [facility], [address] [place], [place] [address]       Labs: BNP (last 3 results) No results for input(s): "BNP" in the last 8760 hours. Basic Metabolic Panel: Recent Labs  Lab 10/26/23 0454 10/27/23 0521 10/28/23 0452 10/29/23 0521 10/30/23 0446  NA 135 136 138 136 136  K 3.9 3.9 3.4* 3.8 4.5  CL 105 106 108 104 105  CO2 18* 19* 23 24 23   GLUCOSE 67* 61* 96 92 127*  BUN 21* 26* 25* 17 22*  CREATININE 1.39* 1.41* 1.38* 1.30* 1.57*  CALCIUM 8.6* 8.7* 8.7* 8.7* 8.6*   Liver Function Tests: No results for input(s): "AST", "ALT", "ALKPHOS", "BILITOT", "PROT", "ALBUMIN" in the last 168 hours. No results for input(s): "LIPASE", "AMYLASE" in the last 168 hours. No results for input(s): "AMMONIA" in the last 168 hours. CBC: Recent Labs  Lab 10/25/23 0607 10/26/23 0454 10/30/23 0446  WBC 9.5 7.4 8.3  HGB 11.2* 10.3* 8.3*  HCT 33.5* 30.9* 24.6*  MCV 95.7 97.2 94.6  PLT 193 155 163   Cardiac Enzymes: Recent Labs  Lab 10/27/23 0521  CKTOTAL 44*   BNP: Invalid input(s): "POCBNP" CBG: No results for input(s): "GLUCAP" in the last 168 hours. D-Dimer No results for input(s): "DDIMER" in the last 72 hours. Hgb A1c No results for input(s): "HGBA1C" in the last 72 hours. Lipid Profile No results for input(s): "CHOL", "HDL", "LDLCALC", "TRIG", "CHOLHDL", "LDLDIRECT" in the last 72 hours. Thyroid function studies No results for input(s): "TSH", "T4TOTAL", "T3FREE", "THYROIDAB" in the last 72 hours.  Invalid input(s): "FREET3" Anemia work up No results for input(s): "VITAMINB12", "FOLATE", "FERRITIN", "TIBC", "IRON", "RETICCTPCT" in the last 72 hours. Urinalysis    Component Value Date/Time   COLORURINE YELLOW (A) 10/25/2023 1802   APPEARANCEUR CLEAR (A) 10/25/2023 1802   APPEARANCEUR Clear 11/18/2011 1958   LABSPEC 1.023 10/25/2023 1802   LABSPEC 1.002 11/18/2011 1958   PHURINE 5.0 10/25/2023  1802   GLUCOSEU NEGATIVE 10/25/2023 1802   GLUCOSEU Negative 11/18/2011 1958   HGBUR NEGATIVE 10/25/2023 1802   BILIRUBINUR  NEGATIVE 10/25/2023 1802   BILIRUBINUR Negative 11/18/2011 1958   KETONESUR 5 (A) 10/25/2023 1802   PROTEINUR NEGATIVE 10/25/2023 1802   NITRITE NEGATIVE 10/25/2023 1802   LEUKOCYTESUR NEGATIVE 10/25/2023 1802   LEUKOCYTESUR Negative 11/18/2011 1958   Sepsis Labs Recent Labs  Lab 10/25/23 0607 10/26/23 0454 10/30/23 0446  WBC 9.5 7.4 8.3   Microbiology Recent Results (from the past 240 hours)  Resp panel by RT-PCR (RSV, Flu A&B, Covid) Anterior Nasal Swab     Status: None   Collection Time: 10/25/23  2:41 PM   Specimen: Anterior Nasal Swab  Result Value Ref Range Status   SARS Coronavirus 2 by RT PCR NEGATIVE NEGATIVE Final    Comment: (NOTE) SARS-CoV-2 target nucleic acids are NOT DETECTED.  The SARS-CoV-2 RNA is generally detectable in upper respiratory specimens during the acute phase of infection. The lowest concentration of SARS-CoV-2 viral copies this assay can detect is 138 copies/mL. A negative result does not preclude SARS-Cov-2 infection and should not be used as the sole basis for treatment or other patient management decisions. A negative result may occur with  improper specimen collection/handling, submission of specimen other than nasopharyngeal swab, presence of viral mutation(s) within the areas targeted by this assay, and inadequate number of viral copies(<138 copies/mL). A negative result must be combined with clinical observations, patient history, and epidemiological information. The expected result is Negative.  Fact Sheet for Patients:  BloggerCourse.com  Fact Sheet for Healthcare Providers:  SeriousBroker.it  This test is no t yet approved or cleared by the [place] FDA and  has been authorized for detection and/or diagnosis of SARS-CoV-2 by FDA under an Emergency Use Authorization (EUA). This EUA will remain  in effect (meaning this test can be used) for the duration of the COVID-19  declaration under Section 564(b)(1) of the Act, 21 U.S.C.section 360bbb-3(b)(1), unless the authorization is terminated  or revoked sooner.       Influenza A by PCR NEGATIVE NEGATIVE Final   Influenza B by PCR NEGATIVE NEGATIVE Final    Comment: (NOTE) The Xpert Xpress SARS-CoV-2/FLU/RSV plus assay is intended as an aid in the diagnosis of influenza from Nasopharyngeal swab specimens and should not be used as a sole basis for treatment. Nasal washings and aspirates are unacceptable for Xpert Xpress SARS-CoV-2/FLU/RSV testing.  Fact Sheet for Patients: BloggerCourse.com  Fact Sheet for Healthcare Providers: SeriousBroker.it  This test is not yet approved or cleared by the [place] FDA and has been authorized for detection and/or diagnosis of SARS-CoV-2 by FDA under an Emergency Use Authorization (EUA). This EUA will remain in effect (meaning this test can be used) for the duration of the COVID-19 declaration under Section 564(b)(1) of the Act, 21 U.S.C. section 360bbb-3(b)(1), unless the authorization is terminated or revoked.     Resp Syncytial Virus by PCR NEGATIVE NEGATIVE Final    Comment: (NOTE) Fact Sheet for Patients: BloggerCourse.com  Fact Sheet for Healthcare Providers: SeriousBroker.it  This test is not yet approved or cleared by the [place] FDA and has been authorized for detection and/or diagnosis of SARS-CoV-2 by FDA under an Emergency Use Authorization (EUA). This EUA will remain in effect (meaning this test can be used) for the duration of the COVID-19 declaration under Section 564(b)(1) of the Act, 21 U.S.C. section 360bbb-3(b)(1), unless the authorization is terminated or revoked.  Performed at  [facility], [address], [place], [place] [address]   MRSA Next Gen by PCR, Nasal     Status: None   Collection Time: 10/26/23 12:47 PM    Specimen: Nasal Mucosa; Nasal Swab  Result Value Ref Range Status   MRSA by PCR Next Gen NOT DETECTED NOT DETECTED Final    Comment: (NOTE) The GeneXpert MRSA Assay (FDA approved for NASAL specimens only), is one component of a comprehensive MRSA colonization surveillance program. It is not intended to diagnose MRSA infection nor to guide or monitor treatment for MRSA infections. Test performance is not FDA approved in patients less than 37 years old. Performed at [facility], [address] [place], [place] [address]    Imaging DG HIP UNILAT WITH PELVIS 2-3 VIEWS LEFT Result Date: 10/26/2023 CLINICAL DATA:  Left hip pain after injury yesterday EXAM: DG HIP (WITH OR WITHOUT PELVIS) 2-3V LEFT COMPARISON:  None Available. FINDINGS: There is no evidence of hip fracture or dislocation. Mild osteophyte formation of the left hip is noted. 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 No acute abnormality seen. Electronically Signed   By: Lupita Raider M.D.   On: 10/26/2023 17:20   MR BRAIN WO CONTRAST Result Date: 10/25/2023 CLINICAL DATA:  Neuro deficit, acute, stroke suspected. Left leg weakness beginning yesterday. EXAM: MRI HEAD WITHOUT CONTRAST TECHNIQUE: Multiplanar, multiecho pulse sequences of the brain and surrounding structures were obtained without intravenous contrast. COMPARISON:  Head CT same day.  MRI 03/22/2021 FINDINGS: Brain: Diffusion imaging does not show any acute or subacute infarction or other cause of restricted diffusion. Chronic small-vessel ischemic changes affect the pons. No focal cerebellar insult. There are numerous old small vessel infarctions within both thalami. Extensive chronic small-vessel ischemic changes present throughout the hemispheric white matter. No large vessel territory stroke. No mass, hemorrhage, hydrocephalus or extra-axial collection. Compared to the study of 2020, findings appear quite similar. Vascular: Major vessels at the  base of the brain show flow. Skull and upper cervical spine: Negative Sinuses/Orbits: Mild seasonal mucosal thickening. No advanced sinus disease. Orbits negative. Other: None IMPRESSION: No acute finding by MRI. Extensive chronic small-vessel ischemic changes of the pons, thalami and hemispheric white matter. No change since 2020. Electronically Signed   By: Paulina Fusi M.D.   On: 10/25/2023 18:21   US Venous Img Lower Unilateral Left (DVT) Result Date: 10/25/2023 CLINICAL DATA:  Left lower extremity pain. EXAM: LEFT LOWER EXTREMITY VENOUS DOPPLER ULTRASOUND TECHNIQUE: Gray-scale sonography with graded compression, as well as color Doppler and duplex ultrasound were performed to evaluate the lower extremity deep venous systems from the level of the common femoral vein and including the common femoral, femoral, profunda femoral, popliteal and calf veins including the posterior tibial, peroneal and gastrocnemius veins when visible. The superficial great saphenous vein was also interrogated. Spectral Doppler was utilized to evaluate flow at rest and with distal augmentation maneuvers in the common femoral, femoral and popliteal veins. COMPARISON:  None Available. FINDINGS: Contralateral Common Femoral Vein: Respiratory phasicity is normal and symmetric with the symptomatic side. No evidence of thrombus. Normal compressibility. Common Femoral Vein: No evidence of thrombus. Normal compressibility, respiratory phasicity and response to augmentation. Saphenofemoral Junction: No evidence of thrombus. Normal compressibility and flow on color Doppler imaging. Profunda Femoral Vein: No evidence of thrombus. Normal compressibility and flow on color Doppler imaging. Femoral Vein: No evidence of thrombus. Normal compressibility, respiratory phasicity and response to augmentation. Popliteal Vein: No evidence of thrombus. Normal compressibility,  respiratory phasicity and response to augmentation. Calf Veins: No evidence of  thrombus. Normal compressibility and flow on color Doppler imaging. Superficial Great Saphenous Vein: No evidence of thrombus. Normal compressibility. Venous Reflux:  None. Other Findings: No evidence of superficial thrombophlebitis or abnormal fluid collection. IMPRESSION: No evidence of left lower extremity deep venous thrombosis. Electronically Signed   By: Irish Lack M.D.   On: 10/25/2023 17:03   DG Chest Port 1 View Result Date: 10/25/2023 CLINICAL DATA:  Shortness of breath with exertion.  Weakness. EXAM: PORTABLE CHEST 1 VIEW COMPARISON:  05/31/2023 FINDINGS: Heart size is normal. Chronic aortic atherosclerosis is noted. Chronic pulmonary scarring, most prominent in the left lower lobe. Findings appear similar to the study of September. Cannot rule out some coexistence left base pneumonia, but most if not all the findings in this area appear chronic. No pleural effusion. No acute bone finding. IMPRESSION: Chronic pulmonary scarring, most prominent in the left lower lobe. Cannot exclude some coexistent left base pneumonia, but most if not all the findings in this area appear chronic. Electronically Signed   By: Paulina Fusi M.D.   On: 10/25/2023 15:09   CT Head Wo Contrast Result Date: 10/25/2023 CLINICAL DATA:  Neuro deficit, acute, stroke suspected. Fell yesterday. Bilateral leg pain. EXAM: CT HEAD WITHOUT CONTRAST TECHNIQUE: Contiguous axial images were obtained from the base of the skull through the vertex without intravenous contrast. RADIATION DOSE REDUCTION: This exam was performed according to the departmental dose-optimization program which includes automated exposure control, adjustment of the mA and/or kV according to patient size and/or use of iterative reconstruction technique. COMPARISON:  MRI 03/23/2019 FINDINGS: Brain: Generalized brain atrophy without subjective lobar predominance. Old small vessel infarction within the right pons. No focal cerebellar insult. Old infarction in both  thalami and basal ganglia. Advanced chronic small-vessel ischemic changes of the white matter. No sign of acute infarction. No mass, hemorrhage, obstructive hydrocephalus or extra-axial collection. Ventricular size is in proportion to the degree of atrophy. Vascular: There is atherosclerotic calcification of the major vessels at the base of the brain. Skull: Negative Sinuses/Orbits: Clear/normal Other: None IMPRESSION: No acute CT finding. Atrophy and chronic small-vessel ischemic changes of the white matter. Old small vessel infarctions in the right pons, both thalami and basal ganglia. Electronically Signed   By: Paulina Fusi M.D.   On: 10/25/2023 15:08   MR LUMBAR SPINE WO CONTRAST Result Date: 10/25/2023 CLINICAL DATA:  Lumbar radiculopathy, increased fracture risk. EXAM: MRI LUMBAR SPINE WITHOUT CONTRAST TECHNIQUE: Multiplanar, multisequence MR imaging of the lumbar spine was performed. No intravenous contrast was administered. COMPARISON:  MRI of the lumbar spine November 07, 2008. FINDINGS: Segmentation:  Standard. Alignment:  Physiologic. Vertebrae: No fracture, evidence of discitis, or bone lesion. Schmorl node in the inferior endplate of L4. Conus medullaris and cauda equina: Conus extends to the T12-L1 level. Conus and cauda equina appear normal. Paraspinal and other soft tissues: Negative. Disc levels: T12-L1: No spinal canal or neural foraminal stenosis. L1-2: Minimal disc bulge. No spinal canal or neural foraminal stenosis. L2-3: Shallow disc bulge and mild facet degenerative changes resulting in mild bilateral neural foraminal narrowing. No significant spinal canal stenosis. L3-4: Shallow disc bulge with superimposed left foraminal annular tear and mild facet degenerative changes. Mild left neural foraminal narrowing. No significant spinal canal stenosis. L4-5: Disc bulge, mild facet degenerative changes and ligamentum flavum redundancy resulting in mild bilateral neural foraminal narrowing. No  significant spinal canal stenosis. L5-S1: Mild facet degenerative changes. No spinal  canal or neural foraminal stenosis. IMPRESSION: 1. Mild degenerative changes of the lumbar spine with mild neural foraminal narrowing at L2-L3, L3-L4 and L4-L5. 2. No high-grade spinal canal or neural foraminal stenosis at any level. Electronically Signed   By: Baldemar Lenis M.D.   On: 10/25/2023 13:02      Time coordinating discharge: over 30 minutes  SIGNED:  Sunnie Nielsen DO [facility]

## 2023-10-30 NOTE — Plan of Care (Signed)
 Problem: Ischemic Stroke/TIA Tissue Perfusion: Goal: Complications of ischemic stroke/TIA will be minimized Outcome: Progressing  Problem: Education: Goal: Knowledge of disease or condition will improve Outcome: Progressing Goal: Knowledge of secondary prevention will improve  Outcome: Progressing Goal: Knowledge of patient specific risk factors will improve  Outcome: Progressing  Problem: Coping: Goal: Will verbalize positive feelings about self Outcome: Progressing Goal: Will identify appropriate support needs Outcome: Progressing   Problem: Health Behavior/Discharge Planning: Goal: Ability to manage health-related needs will improve Outcome: Progressing Goal: Goals will be collaboratively established with patient/family Outcome: Progressing  Problem: Self-Care: Goal: Ability to participate in self-care as condition permits will improve Outcome: Progressing Goal: Verbalization of feelings and concerns over difficulty with self-care will improve Outcome: Progressing Goal: Ability to communicate needs accurately will improve Outcome: Progressing  Problem: Nutrition: Goal: Risk of aspiration will decrease Outcome: Progressing Goal: Dietary intake will improve Outcome: Progressing

## 2023-10-30 NOTE — Evaluation (Signed)
 Occupational Therapy Evaluation Patient Details Name: Donald [name]. MRN: [id number] DOB: Oct 28, 1960 Today's Date: 10/30/2023   History of Present Illness   Pt admitted to [facility] on 10/25/23 for c/o mechanical fall at ALF, resulting in LLE pain. Imaging significant for L femur fx now s/p L ORIF on 2/21 by Dr. Allena Katz. Significant PMH includes: HLD, HTN, neuropathy, BPH, PAD, CKD 3A, moderate COPD, cigarette/tobacco user, hx of multiple CVA, insomnia, alcohol dependence with history of delirium tremens.     Clinical Impressions Mr. Jerrol Helmers. was seen for OT re-evaluation s/p L femur ORIF. Pt reports improved LLE comfort and ROM after sx and demos improved functional activity tolerance and independence since initial OT evaluation. He continues to be functionally limited by decreased activity tolerance, decreased safety awareness, and pain with functional activity (See OT problem list for additional functional deficits). Pt currently requires SUPERVISION for safety for LB ADL management from STS and functional transfers.  Pt continues to benefit from skilled OT services to address noted impairments and functional limitations (see below for any additional details) in order to maximize safety and independence while minimizing falls risk and caregiver burden. Anticipate the need for follow up OT services in the home setting upon acute hospital DC.      If plan is discharge home, recommend the following:   A little help with walking and/or transfers;A little help with bathing/dressing/bathroom;Assistance with cooking/housework;Assist for transportation     Functional Status Assessment   Patient has had a recent decline in their functional status and demonstrates the ability to make significant improvements in function in a reasonable and predictable amount of time.     Equipment Recommendations   None recommended by OT     Recommendations for Other Services          Precautions/Restrictions   Precautions Precautions: Fall Restrictions Weight Bearing Restrictions Per Provider Order: Yes LLE Weight Bearing Per Provider Order: Weight bearing as tolerated Other Position/Activity Restrictions: DNR/Limited     Mobility Bed Mobility               General bed mobility comments: NT pt in recliner at start/end of session.    Transfers Overall transfer level: Needs assistance Equipment used: Rolling walker (2 wheels) Transfers: Sit to/from Stand Sit to Stand: Modified independent (Device/Increase time), Supervision           General transfer comment: Initial supervision for safety and cueing for safe use of RW, pt return demos understanding of education provided and progresses to MOD I for functional transfers by end of session.      Balance Overall balance assessment: Modified Independent Sitting-balance support: Feet supported, No upper extremity supported Sitting balance-Leahy Scale: Normal Sitting balance - Comments: steady with dynamic sitting balance during functional tasks.   Standing balance support: During functional activity, No upper extremity supported Standing balance-Leahy Scale: Good Standing balance comment: Steady static standing at sink without UE suport, steady reaching outside BOS.                           ADL either performed or assessed with clinical judgement   ADL Overall ADL's : Needs assistance/impaired                                       General ADL Comments: Pt dons bilat shoes independetly, performs standing grooming at sink,  reaching outside BOS with supervision for safety. Functional mobility in room with RW at MOD I level. He reports improved comfort (aside from bilat baseline neuropathy in soles of feet) and functional independence from time of initial OT evaluation.     Vision Patient Visual Report: No change from baseline       Perception         Praxis          Pertinent Vitals/Pain Pain Assessment Pain Assessment: 0-10 Pain Score: 9  Pain Location: bilateral feet d/t peripheral neuropathy Pain Descriptors / Indicators: Discomfort, Sore Pain Intervention(s): Limited activity within patient's tolerance, Monitored during session, Repositioned (put on shoes)     Extremity/Trunk Assessment Upper Extremity Assessment Upper Extremity Assessment: Overall WFL for tasks assessed   Lower Extremity Assessment Lower Extremity Assessment: Overall WFL for tasks assessed;LLE deficits/detail;Defer to PT evaluation RLE Deficits / Details: hx 3rd toe amp LLE Deficits / Details: s/p L IM nail, WBAT       Communication Communication Communication: No apparent difficulties   Cognition Arousal: Alert Behavior During Therapy: WFL for tasks assessed/performed Cognition: No apparent impairments                               Following commands: Intact       Cueing  General Comments   Cueing Techniques: Verbal cues      Exercises Other Exercises Other Exercises: Pt educated on safe transfer technique, falls prevention strategies and LLE WBAT considerations for ADL management.   Shoulder Instructions      Home Living Family/patient expects to be discharged to:: Assisted living                             Home Equipment: Rolling Walker (2 wheels);Cane - single point;Grab bars - tub/shower;Grab bars - toilet;Shower seat   Additional Comments: 81yrs at [employer], no steps, access to walk in and tub/shower      Prior Functioning/Environment Prior Level of Function : Independent/Modified Independent;History of Falls (last six months)             Mobility Comments: Indep without assist device for ADLs, household mobilization; denies additional fall history ADLs Comments: indep with ADL    OT Problem List: Decreased strength;Pain;Decreased activity tolerance;Impaired balance (sitting and/or standing);Decreased  knowledge of use of DME or AE;Cardiopulmonary status limiting activity;Decreased range of motion;Decreased knowledge of precautions   OT Treatment/Interventions: Self-care/ADL training;Therapeutic exercise;Therapeutic activities;Balance training;DME and/or AE instruction;Patient/family education;Energy conservation      OT Goals(Current goals can be found in the care plan section)   Acute Rehab OT Goals Patient Stated Goal: To go home OT Goal Formulation: With patient Time For Goal Achievement: 11/13/23 Potential to Achieve Goals: Good   OT Frequency:  Min 1X/week    Co-evaluation              AM-PAC OT "6 Clicks" Daily Activity     Outcome Measure Help from another person eating meals?: None Help from another person taking care of personal grooming?: None Help from another person toileting, which includes using toliet, bedpan, or urinal?: A Little Help from another person bathing (including washing, rinsing, drying)?: A Little Help from another person to put on and taking off regular upper body clothing?: None Help from another person to put on and taking off regular lower body clothing?: A Little 6 Click Score: 21   End  of Session Equipment Utilized During Treatment: Rolling walker (2 wheels)  Activity Tolerance: Patient tolerated treatment well Patient left: in chair;with call bell/phone within reach;with chair alarm set  OT Visit Diagnosis: Other abnormalities of gait and mobility (R26.89);Muscle weakness (generalized) (M62.81);Pain Pain - Right/Left: Left Pain - part of body: Hip (also bilateral neuropathy in feet.)                Time: 1610-9604 OT Time Calculation (min): 12 min Charges:  OT General Charges $OT Visit: 1 Visit OT Evaluation $OT Re-eval: 1 Re-eval OT Treatments $Self Care/Home Management : 8-22 mins  Rockney Ghee, M.S., OTR/L 10/30/23, 9:18 AM

## 2023-10-30 NOTE — Evaluation (Signed)
 Physical Therapy Evaluation Patient Details Name: Donald [name]. MRN: [id number] DOB: 12-24-1960 Today's Date: 10/30/2023  History of Present Illness  Pt admitted to [facility] on 10/25/23 for c/o mechanical fall at ALF, resulting in LLE pain. Imaging significant for L femur fx now s/p L ORIF on 2/21 by Dr. Allena Katz. Significant PMH includes: HLD, HTN, neuropathy, BPH, PAD, CKD 3A, moderate COPD, cigarette/tobacco user, hx of multiple CVA, insomnia, alcohol dependence with history of delirium tremens.   Clinical Impression  Pt is a 63 year old M admitted to hospital on 10/25/23 for mechanical fall resulting in L femur fx, now s/p L ORIF. At baseline, pt is IND with ADL's and ambulation without AD. HE is a long-term care resident at ALF, where they assist with IADL's and medication.   Pt presents with increased bilateral foot pain and diminished sensation secondary to peripheral neuropathy, decreased standing balance, and decreased activity tolerance, resulting in impaired functional mobility from baseline. Due to deficits, pt required mod I for bed mobility, mod I for transfers with RW, and supervision to ambulate 38ft in room with RW. Demonstrates gait deficits during ambulation which is common after this type of surgery, however, no LOB noted.   Deficits limit the pt's ability to safely and independently perform ADL's, transfer, and ambulate. Pt will benefit from acute skilled PT services to address deficits for return to baseline function. Pt will benefit from continued therapy services at home to address deficits for return to baseline function. No DME needs identified at this time; pt states that ALF has RW and shower chair available as needed.         If plan is discharge home, recommend the following: A little help with walking and/or transfers;A little help with bathing/dressing/bathroom   Can travel by private vehicle    Yes    Equipment Recommendations None recommended by PT (has access to  RW at ALF)     Functional Status Assessment Patient has had a recent decline in their functional status and demonstrates the ability to make significant improvements in function in a reasonable and predictable amount of time.     Precautions / Restrictions Precautions Precautions: Fall Restrictions Weight Bearing Restrictions Per Provider Order: Yes LLE Weight Bearing Per Provider Order: Weight bearing as tolerated Other Position/Activity Restrictions: DNR/Limited      Mobility  Bed Mobility Overal bed mobility: Modified Independent Bed Mobility: Supine to Sit     Supine to sit: Modified independent (Device/Increase time)     General bed mobility comments: sit EOB, HOB slightly elevated, use of UE for support    Transfers Overall transfer level: Needs assistance Equipment used: Rolling walker (2 wheels) Transfers: Sit to/from Stand Sit to Stand: Modified independent (Device/Increase time)           General transfer comment: for safety to stand from EOB and sit in recliner; no cues for safety, sequencing, or hand placement. Demonstrates "good" eccentric lowering with UE support.    Ambulation/Gait Ambulation/Gait assistance: Supervision Gait Distance (Feet): 40 Feet Assistive device: Rolling walker (2 wheels)         General Gait Details: Supervision for safety to ambulate in room with RW. Demonstrates narrow BOS, quick cadence, decreased R stance and decreased L foot clearance. Notes that quality of gait has improved since before surgery.      Balance Overall balance assessment: Modified Independent Sitting-balance support: Feet supported Sitting balance-Leahy Scale: Normal     Standing balance support: Bilateral upper extremity supported, During  functional activity, Reliant on assistive device for balance Standing balance-Leahy Scale: Good Standing balance comment: standing balance with RW                             Pertinent Vitals/Pain  Pain Assessment Pain Score: 9  Pain Location: bilateral feet d/t peripheral neuropathy Pain Descriptors / Indicators: Discomfort, Sore Pain Intervention(s): Monitored during session    Home Living Family/patient expects to be discharged to:: Assisted living                 Home Equipment: Agricultural consultant (2 wheels);Cane - single point;Grab bars - tub/shower;Grab bars - toilet;Shower seat Additional Comments: 52yrs at [employer], no steps, access to walk in and tub/shower    Prior Function Prior Level of Function : Independent/Modified Independent;History of Falls (last six months)             Mobility Comments: Indep without assist device for ADLs, household mobilization; denies additional fall history ADLs Comments: indep with ADL     Extremity/Trunk Assessment   Upper Extremity Assessment Upper Extremity Assessment: Overall WFL for tasks assessed    Lower Extremity Assessment Lower Extremity Assessment: Overall WFL for tasks assessed;LLE deficits/detail;Defer to PT evaluation RLE Deficits / Details: hx 3rd toe amp LLE Deficits / Details: s/p L IM nail, WBAT       Communication   Communication Communication: No apparent difficulties    Cognition Arousal: Alert Behavior During Therapy: WFL for tasks assessed/performed   PT - Cognitive impairments: No apparent impairments                         Following commands: Intact       Cueing Cueing Techniques: Verbal cues     General Comments General comments (skin integrity, edema, etc.): SpO2 93% on RA, 96 HR    Exercises Other Exercises Other Exercises: Pt educated re: PT role/POC, DC recommendations, safety with functional mobility, LLE WBAT, use of RW.   Assessment/Plan    PT Assessment Patient needs continued PT services  PT Problem List Decreased activity tolerance;Decreased balance;Decreased mobility;Decreased coordination;Decreased knowledge of precautions;Pain;Impaired sensation        PT Treatment Interventions DME instruction;Gait training;Stair training;Functional mobility training;Therapeutic activities;Balance training;Therapeutic exercise;Patient/family education    PT Goals (Current goals can be found in the Care Plan section)  Acute Rehab PT Goals Patient Stated Goal: return home PT Goal Formulation: With patient Time For Goal Achievement: 11/13/23 Potential to Achieve Goals: Good    Frequency Min 1X/week        AM-PAC PT "6 Clicks" Mobility  Outcome Measure Help needed turning from your back to your side while in a flat bed without using bedrails?: None Help needed moving from lying on your back to sitting on the side of a flat bed without using bedrails?: None Help needed moving to and from a bed to a chair (including a wheelchair)?: A Little Help needed standing up from a chair using your arms (e.g., wheelchair or bedside chair)?: None Help needed to walk in hospital room?: A Little Help needed climbing 3-5 steps with a railing? : A Little 6 Click Score: 21    End of Session Equipment Utilized During Treatment: Gait belt Activity Tolerance: Patient tolerated treatment well;No increased pain Patient left: in chair;with call bell/phone within reach;with chair alarm set Nurse Communication: Mobility status PT Visit Diagnosis: Muscle weakness (generalized) (M62.81);Pain;Difficulty in walking, not elsewhere classified (  R26.2) Pain - part of body:  (bilateral feet d/t neuropathy)    Time: 4098-1191 PT Time Calculation (min) (ACUTE ONLY): 13 min   Charges:   PT Evaluation $PT Eval Low Complexity: 1 Low   PT General Charges $$ ACUTE PT VISIT: 1 Visit        Vira Blanco, PT, DPT 9:11 AM,10/30/23 Physical Therapist - [facility]

## 2023-10-30 NOTE — Progress Notes (Signed)
   Subjective: 1 Day Post-Op Procedure(s) (LRB): INTRAMEDULLARY (IM) NAIL INTERTROCHANTERIC (Left) Patient reports pain as mild.   Patient is well, and has had no acute complaints or problems Denies any CP, SOB, ABD pain. We will start physical therapy today.   Objective: Vital signs in last 24 hours: Temp:  [97.6 F (36.4 C)-98.7 F (37.1 C)] 97.7 F (36.5 C) (02/22 0530) Pulse Rate:  [75-117] 78 (02/22 0530) Resp:  [10-20] 20 (02/22 0530) BP: (106-165)/(76-98) 123/76 (02/22 0530) SpO2:  [87 %-100 %] 97 % (02/22 0530) Weight:  [48.5 kg] 48.5 kg (02/21 1057)  Intake/Output from previous day: 02/21 0701 - 02/22 0700 In: 940 [P.O.:240; I.V.:500; IV Piggyback:200] Out: 50 [Blood:50] Intake/Output this shift: No intake/output data recorded.  Recent Labs    10/30/23 0446  HGB 8.3*   Recent Labs    10/30/23 0446  WBC 8.3  RBC 2.60*  HCT 24.6*  PLT 163   Recent Labs    10/29/23 0521 10/30/23 0446  NA 136 136  K 3.8 4.5  CL 104 105  CO2 24 23  BUN 17 22*  CREATININE 1.30* 1.57*  GLUCOSE 92 127*  CALCIUM 8.7* 8.6*   No results for input(s): "LABPT", "INR" in the last 72 hours.  EXAM General - Patient is Alert, Appropriate, and Oriented Extremity - Neurovascular intact Sensation intact distally Intact pulses distally Dorsiflexion/Plantar flexion intact Dressing - dressing C/D/I and no drainage Motor Function - intact, moving foot and toes well on exam.   Past Medical History:  Diagnosis Date   Depression    Emphysema of lung (HCC)    Essential hypertension    History of chicken pox    Hyponatremia    Insomnia    Low weight    MRSA nasal colonization    Neuropathy    bilateral lower legs   Peripheral vascular disease (HCC)    Pneumonia    Prostate disease    Seasonal allergies    Seizures (HCC)    Sensory ataxia    Sepsis (HCC)    Stroke (HCC)    Wears dentures    full upper    Assessment/Plan:   1 Day Post-Op Procedure(s)  (LRB): INTRAMEDULLARY (IM) NAIL INTERTROCHANTERIC (Left) Principal Problem:   Left leg weakness Active Problems:   Essential hypertension   Benign prostatic hyperplasia   PAD (peripheral artery disease) (HCC)   Peripheral neuropathy   Alcohol dependence, in remission (HCC)   Mononeuropathy of both lower extremities   Benign neoplasm of transverse colon   Insomnia   DTs (delirium tremens) (HCC)   Protein-calorie malnutrition, severe   Acute CVA (cerebrovascular accident) (HCC)   Atherosclerosis of native arteries of the extremities with gangrene (HCC)   Left leg pain   Tobacco use   CKD stage 3a, GFR 45-59 ml/min (HCC)   COPD (chronic obstructive pulmonary disease) (HCC)  Estimated body mass index is 16.27 kg/m as calculated from the following:   Height as of this encounter: 5\' 8"  (1.727 m).   Weight as of this encounter: 48.5 kg. Advance diet Up with therapy Pain well controlled Work on BM VSS Acute post op blood loss anemia with underlying chronic anemia - Hgb 8.3. oral Fe. Recheck Labs in the am CM to assist with discharge   DVT Prophylaxis - TED hose and Eliquis SCDS Weight-Bearing as tolerated to left leg   T. Cranston Neighbor, PA-C [facility] 10/30/2023, 7:59 AM

## 2023-10-31 DIAGNOSIS — R29898 Other symptoms and signs involving the musculoskeletal system: Secondary | ICD-10-CM | POA: Diagnosis not present

## 2023-10-31 LAB — CBC
HCT: 23.8 % — ABNORMAL LOW (ref 39.0–52.0)
Hemoglobin: 8 g/dL — ABNORMAL LOW (ref 13.0–17.0)
MCH: 32.3 pg (ref 26.0–34.0)
MCHC: 33.6 g/dL (ref 30.0–36.0)
MCV: 96 fL (ref 80.0–100.0)
Platelets: 191 10*3/uL (ref 150–400)
RBC: 2.48 MIL/uL — ABNORMAL LOW (ref 4.22–5.81)
RDW: 13.8 % (ref 11.5–15.5)
WBC: 12.3 10*3/uL — ABNORMAL HIGH (ref 4.0–10.5)
nRBC: 0 % (ref 0.0–0.2)

## 2023-10-31 LAB — BASIC METABOLIC PANEL
Anion gap: 8 (ref 5–15)
BUN: 32 mg/dL — ABNORMAL HIGH (ref 6–20)
CO2: 23 mmol/L (ref 22–32)
Calcium: 8.5 mg/dL — ABNORMAL LOW (ref 8.9–10.3)
Chloride: 103 mmol/L (ref 98–111)
Creatinine, Ser: 1.83 mg/dL — ABNORMAL HIGH (ref 0.61–1.24)
GFR, Estimated: 42 mL/min — ABNORMAL LOW (ref >60)
Glucose, Bld: 114 mg/dL — ABNORMAL HIGH (ref 70–99)
Potassium: 4.8 mmol/L (ref 3.5–5.1)
Sodium: 134 mmol/L — ABNORMAL LOW (ref 135–145)

## 2023-10-31 NOTE — Plan of Care (Signed)

## 2023-10-31 NOTE — Plan of Care (Signed)
  Problem: Education: Goal: Knowledge of disease or condition will improve Outcome: Not Applicable Goal: Knowledge of secondary prevention will improve (MUST DOCUMENT ALL) Outcome: Not Applicable Goal: Knowledge of patient specific risk factors will improve (DELETE if not current risk factor) Outcome: Not Applicable   Problem: Ischemic Stroke/TIA Tissue Perfusion: Goal: Complications of ischemic stroke/TIA will be minimized Outcome: Not Applicable   Problem: Coping: Goal: Will verbalize positive feelings about self Outcome: Not Applicable Goal: Will identify appropriate support needs Outcome: Not Applicable   Problem: Health Behavior/Discharge Planning: Goal: Ability to manage health-related needs will improve Outcome: Not Applicable Goal: Goals will be collaboratively established with patient/family Outcome: Not Applicable   Problem: Self-Care: Goal: Ability to participate in self-care as condition permits will improve Outcome: Not Applicable Goal: Verbalization of feelings and concerns over difficulty with self-care will improve Outcome: Not Applicable Goal: Ability to communicate needs accurately will improve Outcome: Not Applicable   Problem: Nutrition: Goal: Risk of aspiration will decrease Outcome: Not Applicable Goal: Dietary intake will improve Outcome: Not Applicable   Problem: Education: Goal: Knowledge of General Education information will improve Description: Including pain rating scale, medication(s)/side effects and non-pharmacologic comfort measures Outcome: Not Applicable   Problem: Health Behavior/Discharge Planning: Goal: Ability to manage health-related needs will improve Outcome: Not Applicable   Problem: Clinical Measurements: Goal: Ability to maintain clinical measurements within normal limits will improve Outcome: Not Applicable Goal: Will remain free from infection Outcome: Not Applicable Goal: Diagnostic test results will improve Outcome:  Not Applicable Goal: Respiratory complications will improve Outcome: Not Applicable Goal: Cardiovascular complication will be avoided Outcome: Not Applicable   Problem: Activity: Goal: Risk for activity intolerance will decrease Outcome: Not Applicable   Problem: Nutrition: Goal: Adequate nutrition will be maintained Outcome: Not Applicable   Problem: Coping: Goal: Level of anxiety will decrease Outcome: Not Applicable   Problem: Elimination: Goal: Will not experience complications related to bowel motility Outcome: Not Applicable Goal: Will not experience complications related to urinary retention Outcome: Not Applicable   Problem: Pain Managment: Goal: General experience of comfort will improve and/or be controlled Outcome: Not Applicable   Problem: Safety: Goal: Ability to remain free from injury will improve Outcome: Not Applicable   Problem: Skin Integrity: Goal: Risk for impaired skin integrity will decrease Outcome: Not Applicable   Problem: Education: Goal: Knowledge of General Education information will improve Description: Including pain rating scale, medication(s)/side effects and non-pharmacologic comfort measures Outcome: Not Applicable   Problem: Health Behavior/Discharge Planning: Goal: Ability to manage health-related needs will improve Outcome: Not Applicable   Problem: Clinical Measurements: Goal: Ability to maintain clinical measurements within normal limits will improve Outcome: Not Applicable Goal: Will remain free from infection Outcome: Not Applicable Goal: Diagnostic test results will improve Outcome: Not Applicable Goal: Respiratory complications will improve Outcome: Not Applicable Goal: Cardiovascular complication will be avoided Outcome: Not Applicable   Problem: Activity: Goal: Risk for activity intolerance will decrease Outcome: Not Applicable   Problem: Nutrition: Goal: Adequate nutrition will be maintained Outcome: Not  Applicable   Problem: Coping: Goal: Level of anxiety will decrease Outcome: Not Applicable   Problem: Elimination: Goal: Will not experience complications related to bowel motility Outcome: Not Applicable Goal: Will not experience complications related to urinary retention Outcome: Not Applicable   Problem: Pain Managment: Goal: General experience of comfort will improve and/or be controlled Outcome: Not Applicable   Problem: Safety: Goal: Ability to remain free from injury will improve Outcome: Not Applicable

## 2023-10-31 NOTE — Progress Notes (Signed)
   Subjective: 2 Days Post-Op Procedure(s) (LRB): INTRAMEDULLARY (IM) NAIL INTERTROCHANTERIC (Left) Patient reports pain as mild.   Patient is well, and has had no acute complaints or problems Denies any CP, SOB, ABD pain. +BM We will continue with physical therapy today.   Objective: Vital signs in last 24 hours: Temp:  [98.4 F (36.9 C)-98.8 F (37.1 C)] 98.5 F (36.9 C) (02/23 0743) Pulse Rate:  [77-84] 84 (02/23 0743) Resp:  [16-18] 18 (02/23 0743) BP: (101-110)/(40-68) 108/68 (02/23 0743) SpO2:  [95 %-96 %] 96 % (02/23 0743)  Intake/Output from previous day: 02/22 0701 - 02/23 0700 In: 240 [P.O.:240] Out: -  Intake/Output this shift: No intake/output data recorded.  Recent Labs    10/30/23 0446 10/31/23 0330  HGB 8.3* 8.0*   Recent Labs    10/30/23 0446 10/31/23 0330  WBC 8.3 12.3*  RBC 2.60* 2.48*  HCT 24.6* 23.8*  PLT 163 191   Recent Labs    10/30/23 0446 10/31/23 0330  NA 136 134*  K 4.5 4.8  CL 105 103  CO2 23 23  BUN 22* 32*  CREATININE 1.57* 1.83*  GLUCOSE 127* 114*  CALCIUM 8.6* 8.5*   No results for input(s): "LABPT", "INR" in the last 72 hours.  EXAM General - Patient is Alert, Appropriate, and Oriented Extremity - Neurovascular intact Sensation intact distally Intact pulses distally Dorsiflexion/Plantar flexion intact Dressing - dressing C/D/I and no drainage Motor Function - intact, moving foot and toes well on exam.   Past Medical History:  Diagnosis Date   Depression    Emphysema of lung (HCC)    Essential hypertension    History of chicken pox    Hyponatremia    Insomnia    Low weight    MRSA nasal colonization    Neuropathy    bilateral lower legs   Peripheral vascular disease (HCC)    Pneumonia    Prostate disease    Seasonal allergies    Seizures (HCC)    Sensory ataxia    Sepsis (HCC)    Stroke (HCC)    Wears dentures    full upper    Assessment/Plan:   2 Days Post-Op Procedure(s)  (LRB): INTRAMEDULLARY (IM) NAIL INTERTROCHANTERIC (Left) Principal Problem:   Left leg weakness Active Problems:   Essential hypertension   Benign prostatic hyperplasia   PAD (peripheral artery disease) (HCC)   Peripheral neuropathy   Alcohol dependence, in remission (HCC)   Mononeuropathy of both lower extremities   Benign neoplasm of transverse colon   Insomnia   DTs (delirium tremens) (HCC)   Protein-calorie malnutrition, severe   Acute CVA (cerebrovascular accident) (HCC)   Atherosclerosis of native arteries of the extremities with gangrene (HCC)   Left leg pain   Tobacco use   CKD stage 3a, GFR 45-59 ml/min (HCC)   COPD (chronic obstructive pulmonary disease) (HCC)  Estimated body mass index is 16.27 kg/m as calculated from the following:   Height as of this encounter: 5\' 8"  (1.727 m).   Weight as of this encounter: 48.5 kg. Advance diet Up with therapy Pain well controlled + BM VSS Acute post op blood loss anemia with underlying chronic anemia - Hgb 8.0. oral Fe. Recheck Labs in the am CM to assist with discharge   DVT Prophylaxis - TED hose and Eliquis SCDS Weight-Bearing as tolerated to left leg   T. Cranston Neighbor, PA-C [facility] 10/31/2023, 12:02 PM

## 2023-10-31 NOTE — Progress Notes (Signed)
 PROGRESS NOTE    Donald [name].   UJW:[id number] DOB: [birth year]  DOA: 10/25/2023 Date of Service: 10/31/23 which is hospital day 5  PCP: Ellan Lambert, NP    Hospital course / significant events:   HPI:  [name], is a 63 year old male with history of hyperlipidemia, hypertension, neuropathy, BPH, PAD, CKD 3A, moderate COPD, cigarette/tobacco user, history of CVA, insomnia, alcohol dependence with history of delirium tremens, who presents to the emergency department for chief concerns of left leg weakness. He reports that at baseline he ambulates without a walker. He endorses that he does live at assisted living due to having had 8 strokes in the past. He reports that yesterday, he fell because he was opening the door to his room when he suddenly felt left leg weakness, his left leg giving out on him and he fell. He denies loss of consciousness. He states that the weakness of his LLE primarily involves his thigh, with difficulty flexing his leg at the hip, but also with significant weakness of leg extension. He denies sensory loss in either of his lower extremities.   02/17: admitted to hospitalist service w/ concern for TIA/CVA. MRI brain showed nothing acute, (+)extensive chronic small vessel eischemic changes. LLE [place] Neg CVT. MRI L-spine (+)degenerative changes w/o high grade canal/foraminal stenosis  02/18: XR hip (+)degenerative joint disease no fracture/dislocation, Neurology saw pt - "Exam reveals focal weakness of his LLE, significantly worse proximally with 2-3/5 hip flexion, 3/5 hip adduction, 4-/5 knee extension and flexion, 4/5 ADF and APF." "DDx for focal proximal > distal LLE weakness... Includes lumbosacral plexopathy, focal myopathy, iliopsoas focal lesion such as a hematoma or mass, and focal ischemic nerve injury of the femoral nerve... Per literature search, intermittent claudication is one cause of hip and thigh pain with weakness of hip flexion." Recs for MRI L  thigh to assess for myopathy, which was ordered but not done this day. CT pelvis advised. CK level not collected. Hold statin. Consider vascular surgery consult/followup.  02/19: MRI L thigh/femur pending read. CT no iliopsoas abn but is showing some occlusive disease in bilateral common iliac and external iliac stents - I've asked vascular to weigh in on whether or to what extent this might be causing symptoms.  02/20: MRI (+)L femur fx, ortho to take to OR tomorrow. Vacsular saw pt, no concerns.  02/21: to OR for nail fixation L femur 02/22: Stable for discharge but his facility will not take him back - pending eval Mon 02/24 02/23: stable, mild AKI     Consultants:  Neurology  Vascular Surgery Orthopedic Surgery   Procedures/Surgeries: 10/29/23 left intramedullary intertrochanteric nail placement - Dr Signa Kell      ASSESSMENT & PLAN:   Acute nondisplaced left intertrochanteric femur fracture.  Potential for LLR weakness (see below) led to fall and still need w/u Ortho following To OR today for pin PT/OT to follow after surgery / per ortho  Left leg weakness Have reasonably ruled out CVA, lumbar disc disease / nerve compression Ddx Includes lumbosacral plexopathy, focal myopathy, iliopsoas focal lesion such as a hematoma or mass, focal ischemic nerve injury of the femoral nerve, intermittent claudication  Neurology has s/o  We were holding statin but can probably restart now  Consider outpatient Neurology referral for EMG/NCS if postop course still demonstrating significant weakness    PAD Normal ABIs last month and palpable distal pulses in left leg, Donald [name] not think left leg dysfunction is 2/2 arterial disease  Restart statin  holding apixaban pending ortho to OR  Vascular consult to comment on posisbility claudication may be causing weakness --> no intervention, follow routinely outpatient, suspect neuropathy more a contributor, less so any claudication problem   History  CVA MRI brain neg for acute stroke holding statin as above but can probably restart  holding apixaban pending ortho to OR    AKI Low po intake Po hydration Follow BMP in AM  HTN Here bp mild elevation, also tachycardic in setting of holding home dilt and metop resume home metop and dilt   CKD 3a Kidney function is at baseline. Bicarb low today from normal yesterday repeat bmp, may need further eval if bicarb remains low   Insomnia home amitryptyline   BPH home proscar, flomax   COPD Quiescent incruse for home spiriva   Neuropathy home gabapentin - reduced dose per pharmacy recs given CrCl   GAD home meds  History alcoholism Reports last drink 5 years ago No intervention     underweight based on BMI: Body mass index is 16.27 kg/m.  Underweight - under 18  overweight - 25 to 29 obese - 30 or more Class 1 obesity: BMI of 30.0 to 34 Class 2 obesity: BMI of 35.0 to 39 Class 3 obesity: BMI of 40.0 to 49 Super Morbid Obesity: BMI 50-59 Super-super Morbid Obesity: BMI 60+ Significantly low or high BMI is associated with higher medical risk.  Weight management advised as adjunct to other disease management and risk reduction treatments    DVT prophylaxis: eliquis  IV fluids: no continuous IV fluids  Nutrition: cardiac diet Central lines / invasive devices: none  Code Status: DNR ACP documentation reviewed:  none on file in [facility]  TOC needs: home health PT/OT - will need reassessed following surgery  Barriers to dispo / significant pending items: for surgery today w/ Ortho              Subjective / Brief ROS:  Patient reports feeling ok today not very hungry Denies CP/SOB.  Pain controlled.  Denies new weakness.  Tolerating diet but low appetite.  Reports no concerns w/ urination/defecation.   Family Communication: none at this time     Objective Findings:  Vitals:   10/30/23 1827 10/30/23 2039 10/31/23 0529 10/31/23 0743  BP: 105/64  110/66 (!) 101/40 108/68  Pulse: 82 80 77 84  Resp: 18 16 18 18   Temp: 98.4 F (36.9 C) 98.7 F (37.1 C) 98.8 F (37.1 C) 98.5 F (36.9 C)  TempSrc:      SpO2: 95% 95% 95% 96%  Weight:      Height:        Intake/Output Summary (Last 24 hours) at 10/31/2023 1146 Last data filed at 10/30/2023 2157 Gross per 24 hour  Intake 240 ml  Output --  Net 240 ml   Filed Weights   10/25/23 0605 10/29/23 1057  Weight: 48.5 kg 48.5 kg    Examination:  Physical Exam Constitutional:      General: He is not in acute distress.    Comments: Thin  Cardiovascular:     Rate and Rhythm: Normal rate and regular rhythm.  Pulmonary:     Effort: Pulmonary effort is normal.     Breath sounds: Normal breath sounds.  Musculoskeletal:     Right lower leg: No edema.     Left lower leg: No edema.  Skin:    General: Skin is warm and dry.  Neurological:     General:  No focal deficit present.     Mental Status: He is alert and oriented to person, place, and time.  Psychiatric:        Mood and Affect: Mood normal.        Behavior: Behavior normal.          Scheduled Medications:    stroke: early stages of recovery book   Does not apply Once   acetaminophen  1,000 mg Oral Q8H   amitriptyline  10 mg Oral QHS   apixaban  2.5 mg Oral BID   ascorbic acid  250 mg Oral BID   diltiazem  180 mg Oral Daily   docusate sodium  100 mg Oral BID   Fe Fum-Vit C-Vit B12-FA  1 capsule Oral QPC breakfast   finasteride  5 mg Oral Daily   gabapentin  500 mg Oral TID   metoprolol succinate  50 mg Oral Daily   multivitamin with minerals  1 tablet Oral Daily   tamsulosin  0.4 mg Oral BID AC   traZODone  50 mg Oral QHS   umeclidinium bromide  1 puff Inhalation Daily    Continuous Infusions:   PRN Medications:  albuterol, bisacodyl, HYDROmorphone (DILAUDID) injection, lidocaine, methocarbamol **OR** methocarbamol (ROBAXIN) injection, metoCLOPramide **OR** metoCLOPramide (REGLAN) injection, ondansetron  **OR** ondansetron (ZOFRAN) IV, oxyCODONE, oxyCODONE, senna-docusate, sodium phosphate  Antimicrobials from admission:  Anti-infectives (From admission, onward)    Start     Dose/Rate Route Frequency Ordered Stop   10/29/23 1800  ceFAZolin (ANCEF) IVPB 1 g/50 mL premix        1 g 100 mL/hr over 30 Minutes Intravenous Every 6 hours 10/29/23 1536 10/30/23 0702   10/29/23 1200  ceFAZolin (ANCEF) IVPB 2g/100 mL premix        2 g 200 mL/hr over 30 Minutes Intravenous 30 min pre-op 10/28/23 0935 10/29/23 1208           Data Reviewed:  I have personally reviewed the following...  CBC: Recent Labs  Lab 10/25/23 0607 10/26/23 0454 10/30/23 0446 10/31/23 0330  WBC 9.5 7.4 8.3 12.3*  HGB 11.2* 10.3* 8.3* 8.0*  HCT 33.5* 30.9* 24.6* 23.8*  MCV 95.7 97.2 94.6 96.0  PLT 193 155 163 191   Basic Metabolic Panel: Recent Labs  Lab 10/27/23 0521 10/28/23 0452 10/29/23 0521 10/30/23 0446 10/31/23 0330  NA 136 138 136 136 134*  K 3.9 3.4* 3.8 4.5 4.8  CL 106 108 104 105 103  CO2 19* 23 24 23 23   GLUCOSE 61* 96 92 127* 114*  BUN 26* 25* 17 22* 32*  CREATININE 1.41* 1.38* 1.30* 1.57* 1.83*  CALCIUM 8.7* 8.7* 8.7* 8.6* 8.5*   GFR: Estimated Creatinine Clearance: 29.4 mL/min (A) (by C-G formula based on SCr of 1.83 mg/dL (H)). Liver Function Tests: No results for input(s): "AST", "ALT", "ALKPHOS", "BILITOT", "PROT", "ALBUMIN" in the last 168 hours. No results for input(s): "LIPASE", "AMYLASE" in the last 168 hours. No results for input(s): "AMMONIA" in the last 168 hours. Coagulation Profile: No results for input(s): "INR", "PROTIME" in the last 168 hours. Cardiac Enzymes: Recent Labs  Lab 10/27/23 0521  CKTOTAL 44*   BNP (last 3 results) No results for input(s): "PROBNP" in the last 8760 hours. HbA1C: No results for input(s): "HGBA1C" in the last 72 hours. CBG: No results for input(s): "GLUCAP" in the last 168 hours. Lipid Profile: No results for input(s): "CHOL",  "HDL", "LDLCALC", "TRIG", "CHOLHDL", "LDLDIRECT" in the last 72 hours. Thyroid Function Tests: No results  for input(s): "TSH", "T4TOTAL", "FREET4", "T3FREE", "THYROIDAB" in the last 72 hours. Anemia Panel: No results for input(s): "VITAMINB12", "FOLATE", "FERRITIN", "TIBC", "IRON", "RETICCTPCT" in the last 72 hours. Most Recent Urinalysis On File:     Component Value Date/Time   COLORURINE YELLOW (A) 10/25/2023 1802   APPEARANCEUR CLEAR (A) 10/25/2023 1802   APPEARANCEUR Clear 11/18/2011 1958   LABSPEC 1.023 10/25/2023 1802   LABSPEC 1.002 11/18/2011 1958   PHURINE 5.0 10/25/2023 1802   GLUCOSEU NEGATIVE 10/25/2023 1802   GLUCOSEU Negative 11/18/2011 1958   HGBUR NEGATIVE 10/25/2023 1802   BILIRUBINUR NEGATIVE 10/25/2023 1802   BILIRUBINUR Negative 11/18/2011 1958   KETONESUR 5 (A) 10/25/2023 1802   PROTEINUR NEGATIVE 10/25/2023 1802   NITRITE NEGATIVE 10/25/2023 1802   LEUKOCYTESUR NEGATIVE 10/25/2023 1802   LEUKOCYTESUR Negative 11/18/2011 1958   Sepsis Labs: @LABRCNTIP (procalcitonin:4,lacticidven:4) Microbiology: Recent Results (from the past 240 hours)  Resp panel by RT-PCR (RSV, Flu A&B, Covid) Anterior Nasal Swab     Status: None   Collection Time: 10/25/23  2:41 PM   Specimen: Anterior Nasal Swab  Result Value Ref Range Status   SARS Coronavirus 2 by RT PCR NEGATIVE NEGATIVE Final    Comment: (NOTE) SARS-CoV-2 target nucleic acids are NOT DETECTED.  The SARS-CoV-2 RNA is generally detectable in upper respiratory specimens during the acute phase of infection. The lowest concentration of SARS-CoV-2 viral copies this assay can detect is 138 copies/mL. A negative result does not preclude SARS-Cov-2 infection and should not be used as the sole basis for treatment or other patient management decisions. A negative result may occur with  improper specimen collection/handling, submission of specimen other than nasopharyngeal swab, presence of viral mutation(s) within  the areas targeted by this assay, and inadequate number of viral copies(<138 copies/mL). A negative result must be combined with clinical observations, patient history, and epidemiological information. The expected result is Negative.  Fact Sheet for Patients:  BloggerCourse.com  Fact Sheet for Healthcare Providers:  SeriousBroker.it  This test is no t yet approved or cleared by the [place] FDA and  has been authorized for detection and/or diagnosis of SARS-CoV-2 by FDA under an Emergency Use Authorization (EUA). This EUA will remain  in effect (meaning this test can be used) for the duration of the COVID-19 declaration under Section 564(b)(1) of the Act, 21 U.S.C.section 360bbb-3(b)(1), unless the authorization is terminated  or revoked sooner.       Influenza A by PCR NEGATIVE NEGATIVE Final   Influenza B by PCR NEGATIVE NEGATIVE Final    Comment: (NOTE) The Xpert Xpress SARS-CoV-2/FLU/RSV plus assay is intended as an aid in the diagnosis of influenza from Nasopharyngeal swab specimens and should not be used as a sole basis for treatment. Nasal washings and aspirates are unacceptable for Xpert Xpress SARS-CoV-2/FLU/RSV testing.  Fact Sheet for Patients: BloggerCourse.com  Fact Sheet for Healthcare Providers: SeriousBroker.it  This test is not yet approved or cleared by the [place] FDA and has been authorized for detection and/or diagnosis of SARS-CoV-2 by FDA under an Emergency Use Authorization (EUA). This EUA will remain in effect (meaning this test can be used) for the duration of the COVID-19 declaration under Section 564(b)(1) of the Act, 21 U.S.C. section 360bbb-3(b)(1), unless the authorization is terminated or revoked.     Resp Syncytial Virus by PCR NEGATIVE NEGATIVE Final    Comment: (NOTE) Fact Sheet for  Patients: BloggerCourse.com  Fact Sheet for Healthcare Providers: SeriousBroker.it  This test is not yet approved or cleared  by the [place] and has been authorized for detection and/or diagnosis of SARS-CoV-2 by FDA under an Emergency Use Authorization (EUA). This EUA will remain in effect (meaning this test can be used) for the duration of the COVID-19 declaration under Section 564(b)(1) of the Act, 21 U.S.C. section 360bbb-3(b)(1), unless the authorization is terminated or revoked.  Performed at [facility], [address] [place], [place] [address]   MRSA Next Gen by PCR, Nasal     Status: None   Collection Time: 10/26/23 12:47 PM   Specimen: Nasal Mucosa; Nasal Swab  Result Value Ref Range Status   MRSA by PCR Next Gen NOT DETECTED NOT DETECTED Final    Comment: (NOTE) The GeneXpert MRSA Assay (FDA approved for NASAL specimens only), is one component of a comprehensive MRSA colonization surveillance program. It is not intended to diagnose MRSA infection nor to guide or monitor treatment for MRSA infections. Test performance is not FDA approved in patients less than 54 years old. Performed at [facility], [address] [place], [place] [address]       Radiology Studies last 3 days: DG HIP UNILAT WITH PELVIS 2-3 VIEWS LEFT Result Date: 10/29/2023 CLINICAL DATA:  Left hip intramedullary nail.  Elective surgery. EXAM: DG HIP (WITH OR WITHOUT PELVIS) 2-3V LEFT COMPARISON:  Pelvis and left hip radiographs 10/26/2023, CT pelvis 10/27/2023, MRI left femur 10/27/2023 FINDINGS: Images were performed intraoperatively without the presence of a radiologist. Interval cephalomedullary nail fixation of the proximal left femur including the previously seen intertrochanteric acute fracture. No hardware complication. Total fluoroscopy images: 6 Total fluoroscopy time: 60 seconds Total dose: Radiation Exposure  Index (as provided by the fluoroscopic device): 6.01 mGy air Kerma Please see intraoperative findings for further detail. IMPRESSION: Intraoperative fluoroscopy for cephalomedullary nail fixation of the proximal left femur. Electronically Signed   By: Neita Garnet M.D.   On: 10/29/2023 15:18   DG C-Arm 1-60 Min-No Report Result Date: 10/29/2023 Fluoroscopy was utilized by the requesting physician.  No radiographic interpretation.   MR FEMUR LEFT W WO CONTRAST Result Date: 10/27/2023 CLINICAL DATA:  Left leg pain and weakness primarily involving the thigh. Fell yesterday. Assess for myopathy. EXAM: MR OF THE LEFT LOWER EXTREMITY WITHOUT AND WITH CONTRAST TECHNIQUE: Multiplanar, multisequence MR imaging of the left thigh was performed both before and after administration of intravenous contrast. CONTRAST:  5mL GADAVIST GADOBUTROL 1 MMOL/ML IV SOLN COMPARISON:  None Available. FINDINGS: Bones/Joint/Cartilage Acute nondisplaced left intertrochanteric femur fracture. No dislocation. Joint spaces are preserved. Small left hip joint effusion. Muscles and Tendons Prominent edema in the proximal left adductor muscles and proximal vastus medialis muscle. No associated enhancement. No significant muscle atrophy. Soft tissue No fluid collection or hematoma.  No soft tissue mass. IMPRESSION: 1. Acute nondisplaced left intertrochanteric femur fracture. 2. Prominent edema in the proximal left adductor muscles and proximal vastus medialis muscle, likely strains/post-traumatic. No associated enhancement to suggest myositis. Electronically Signed   By: Obie Dredge M.D.   On: 10/27/2023 16:57        Donald Nielsen, Donald [name] [facility] 10/31/2023, 11:46 AM    Dictation software may have been used to generate the above note. Typos may occur and escape review in typed/dictated notes. Please contact Dr Lyn Hollingshead directly for clarity if needed.  Staff may message me via secure chat in Epic  but this may not  receive an immediate response,  please page me for urgent matters!  If 7PM-7AM, please contact night coverage  [email or web address]

## 2023-11-01 ENCOUNTER — Encounter: Payer: Self-pay | Admitting: Orthopedic Surgery

## 2023-11-01 DIAGNOSIS — I6330 Cerebral infarction due to thrombosis of unspecified cerebral artery: Secondary | ICD-10-CM | POA: Diagnosis not present

## 2023-11-01 DIAGNOSIS — R29898 Other symptoms and signs involving the musculoskeletal system: Secondary | ICD-10-CM | POA: Diagnosis not present

## 2023-11-01 LAB — BASIC METABOLIC PANEL
Anion gap: 7 (ref 5–15)
BUN: 31 mg/dL — ABNORMAL HIGH (ref 6–20)
CO2: 23 mmol/L (ref 22–32)
Calcium: 8.3 mg/dL — ABNORMAL LOW (ref 8.9–10.3)
Chloride: 105 mmol/L (ref 98–111)
Creatinine, Ser: 1.53 mg/dL — ABNORMAL HIGH (ref 0.61–1.24)
GFR, Estimated: 52 mL/min — ABNORMAL LOW (ref >60)
Glucose, Bld: 91 mg/dL (ref 70–99)
Potassium: 4.2 mmol/L (ref 3.5–5.1)
Sodium: 135 mmol/L (ref 135–145)

## 2023-11-01 NOTE — Discharge Instructions (Signed)
 INSTRUCTIONS AFTER Surgery  Remove items at home which could result in a fall. This includes throw rugs or furniture in walking pathways ICE to the affected joint every three hours while awake for 30 minutes at a time, for at least the first 3-5 days, and then as needed for pain and swelling.  Continue to use ice for pain and swelling. You may notice swelling that will progress down to the foot and ankle.  This is normal after surgery.  Elevate your leg when you are not up walking on it.   Continue to use the breathing machine you got in the hospital (incentive spirometer) which will help keep your temperature down.  It is common for your temperature to cycle up and down following surgery, especially at night when you are not up moving around and exerting yourself.  The breathing machine keeps your lungs expanded and your temperature down.   DIET:  As you were doing prior to hospitalization, we recommend a well-balanced diet.  DRESSING / WOUND CARE / SHOWERING  Resting change as needed.  No showering.  Follow-up at [facility] clinic orthopedics in 2 weeks for staple removal and x-rays of the left hip  ACTIVITY  Increase activity slowly as tolerated, but follow the weight bearing instructions below.   No driving for 6 weeks or until further direction given by your physician.  You cannot drive while taking narcotics.  No lifting or carrying greater than 10 lbs. until further directed by your surgeon. Avoid periods of inactivity such as sitting longer than an hour when not asleep. This helps prevent blood clots.  You may return to work once you are authorized by your doctor.     WEIGHT BEARING  Weightbearing as tolerated on the left   EXERCISES Gait training and ambulation training with physical therapy and Occupational Therapy  CONSTIPATION  Constipation is defined medically as fewer than three stools per week and severe constipation as less than one stool per week.  Even if you have a  regular bowel pattern at home, your normal regimen is likely to be disrupted due to multiple reasons following surgery.  Combination of anesthesia, postoperative narcotics, change in appetite and fluid intake all can affect your bowels.   YOU MUST use at least one of the following options; they are listed in order of increasing strength to get the job done.  They are all available over the counter, and you may need to use some, POSSIBLY even all of these options:    Drink plenty of fluids (prune juice may be helpful) and high fiber foods Colace 100 mg by mouth twice a day  Senokot for constipation as directed and as needed Dulcolax (bisacodyl), take with full glass of water  Miralax (polyethylene glycol) once or twice a day as needed.  If you have tried all these things and are unable to have a bowel movement in the first 3-4 days after surgery call either your surgeon or your primary doctor.    If you experience loose stools or diarrhea, hold the medications until you stool forms back up.  If your symptoms do not get better within 1 week or if they get worse, check with your doctor.  If you experience "the worst abdominal pain ever" or develop nausea or vomiting, please contact the office immediately for further recommendations for treatment.   ITCHING:  If you experience itching with your medications, try taking only a single pain pill, or even half a pain pill at a  time.  You can also use Benadryl over the counter for itching or also to help with sleep.   TED HOSE STOCKINGS:  Use stockings on both legs until for at least 2 weeks or as directed by physician office. They may be removed at night for sleeping.  MEDICATIONS:  See your medication summary on the "After Visit Summary" that nursing will review with you.  You may have some home medications which will be placed on hold until you complete the course of blood thinner medication.  It is important for you to complete the blood thinner  medication as prescribed.  PRECAUTIONS:  If you experience chest pain or shortness of breath - call 911 immediately for transfer to the hospital emergency department.   If you develop a fever greater that 101 F, purulent drainage from wound, increased redness or drainage from wound, foul odor from the wound/dressing, or calf pain - CONTACT YOUR SURGEON.                                                   FOLLOW-UP APPOINTMENTS:  If you do not already have a post-op appointment, please call the office for an appointment to be seen by your surgeon.  Guidelines for how soon to be seen are listed in your "After Visit Summary", but are typically between 1-4 weeks after surgery.  OTHER INSTRUCTIONS:     MAKE SURE YOU:  Understand these instructions.  Get help right away if you are not doing well or get worse.    Thank you for letting us be a part of your medical care team.  It is a privilege we respect greatly.  We hope these instructions will help you stay on track for a fast and full recovery!

## 2023-11-01 NOTE — NC FL2 (Signed)
 [place] MEDICAID FL2 LEVEL OF CARE FORM     IDENTIFICATION  Patient Name: Donald [name]. Birthdate: [birth year] Sex: male Admission Date (Current Location): 10/25/2023  [facility] and [place] Number:  Chiropodist and Address:  [facility], [address], [place], [place] [address]      Provider Number: [id number]  Attending Physician Name and Address:  Sunnie Nielsen, DO  Relative Name and Phone Number:       Current Level of Care: Hospital Recommended Level of Care: Assisted Living Facility Prior Approval Number:    Date Approved/Denied:   PASRR Number:    Discharge Plan:      Current Diagnoses: Patient Active Problem List   Diagnosis Date Noted   CKD stage 3a, GFR 45-59 ml/min (HCC) 10/26/2023   COPD (chronic obstructive pulmonary disease) (HCC) 10/26/2023   Left leg weakness 10/25/2023   Left leg pain 10/25/2023   Tobacco use 10/25/2023   Atherosclerosis of native arteries of the extremities with gangrene (HCC) 05/16/2019   Acute CVA (cerebrovascular accident) (HCC)    Healthcare-associated pneumonia    Goals of care, counseling/discussion    Palliative care by specialist    DNR (do not resuscitate) discussion    Sepsis (HCC) 03/13/2019   Protein-calorie malnutrition, severe 03/06/2019   Pressure injury of skin 03/05/2019   DTs (delirium tremens) (HCC) 03/04/2019   Protein calorie malnutrition (HCC) 04/12/2018   Insomnia 04/12/2018   Low weight 07/06/2017   Environmental and seasonal allergies 12/29/2016   Special screening for malignant neoplasms, colon    Benign neoplasm of cecum    Benign neoplasm of transverse colon    Benign neoplasm of sigmoid colon    Alcohol dependence, in remission (HCC) 01/20/2016   Mononeuropathy of both lower extremities 01/20/2016   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   Hyponatremia 11/14/2015   Peripheral neuropathy 07/29/2015   Essential hypertension 06/20/2015   Benign prostatic hyperplasia  06/20/2015   PAD (peripheral artery disease) (HCC) 06/20/2015   Former smoker 06/20/2015   Centrilobular emphysema (HCC) 06/20/2015    Orientation RESPIRATION BLADDER Height & Weight     Self, Time, Situation, Place  Normal Continent Weight: 107 lb (48.5 kg) Height:  5\' 8"  (172.7 cm)  BEHAVIORAL SYMPTOMS/MOOD NEUROLOGICAL BOWEL NUTRITION STATUS      Continent Diet (Regular)  AMBULATORY STATUS COMMUNICATION OF NEEDS Skin   Supervision Verbally Surgical wounds (incision left thigh)                       Personal Care Assistance Level of Assistance  Bathing, Feeding, Dressing Bathing Assistance: Limited assistance Feeding assistance: Independent Dressing Assistance: Limited assistance     Functional Limitations Info  Sight, Hearing, Speech Sight Info: Impaired Hearing Info: Adequate Speech Info: Adequate    SPECIAL CARE FACTORS FREQUENCY                       Contractures Contractures Info: Not present    Additional Factors Info  Code Status, Allergies Code Status Info: DNR Allergies Info: Lidocaine            Discharge Medications: STOP taking these medications     hydrALAZINE 50 MG tablet Commonly known as: APRESOLINE    lidocaine 5 % Commonly known as: Lidoderm    losartan 25 MG tablet Commonly known as: COZAAR           TAKE these medications     acetaminophen 500 MG tablet  Commonly known as: TYLENOL Take 2 tablets (1,000 mg total) by mouth every 8 (eight) hours. What changed:  how much to take when to take this reasons to take this    albuterol 108 (90 Base) MCG/ACT inhaler Commonly known as: VENTOLIN HFA Inhale 2 puffs into the lungs every 6 (six) hours as needed for wheezing or shortness of breath.    amitriptyline 10 MG tablet Commonly known as: ELAVIL Take 10 mg by mouth at bedtime.    ascorbic acid 250 MG tablet Commonly known as: VITAMIN C Take 1 tablet (250 mg total) by mouth 2 (two) times daily.    atorvastatin  10 MG tablet Commonly known as: LIPITOR Take 10 mg by mouth daily.    diltiazem 240 MG 24 hr capsule Commonly known as: CARDIZEM CD Take 1 capsule (240 mg total) by mouth daily.    docusate sodium 100 MG capsule Commonly known as: COLACE Take 100 mg by mouth daily.    Eliquis 2.5 MG Tabs tablet Generic drug: apixaban Take 2.5 mg by mouth 2 (two) times daily. What changed: Another medication with the same name was removed. Continue taking this medication, and follow the directions you see here.    ferrous sulfate 325 (65 FE) MG EC tablet Take 325 mg by mouth daily at 6 (six) AM.    finasteride 5 MG tablet Commonly known as: PROSCAR Take 1 tablet (5 mg total) by mouth daily.    gabapentin 400 MG capsule Commonly known as: NEURONTIN Take 400 mg by mouth 3 (three) times daily. Take in addition to one 100 mg capsule for total 500 mg three times daily What changed: Another medication with the same name was removed. Continue taking this medication, and follow the directions you see here.    magnesium oxide 400 (240 Mg) MG tablet Commonly known as: MAG-OX Take 400 mg by mouth daily.    melatonin 5 MG Tabs Take 10 mg by mouth at bedtime.    methocarbamol 500 MG tablet Commonly known as: ROBAXIN Take 500 mg by mouth every 8 (eight) hours as needed for muscle spasms.    metoprolol succinate 50 MG 24 hr tablet Commonly known as: TOPROL-XL Take 2 tablets (100 mg total) by mouth daily. What changed: how much to take    multivitamin with minerals tablet Take 1 tablet by mouth daily.    naproxen 500 MG tablet Commonly known as: NAPROSYN Take 500 mg by mouth 2 (two) times daily.    nicotine 21 mg/24hr patch Commonly known as: NICODERM CQ - dosed in mg/24 hours Place 1 patch (21 mg total) onto the skin daily as needed (nicotine craving).    oxyCODONE 5 MG immediate release tablet Commonly known as: Oxy IR/ROXICODONE Take 0.5-1 tablets (2.5-5 mg total) by mouth every 4 (four)  hours as needed for moderate pain (pain score 4-6) (pain score 4-6).    oxyCODONE-acetaminophen 10-325 MG tablet Commonly known as: PERCOCET Take 1 tablet by mouth every 6 (six) hours as needed.    Refresh Digital 0.5-1-0.5 % Soln Generic drug: Carboxymeth-Glycerin-Polysorb Apply to eye.    Spiriva Respimat 2.5 MCG/ACT Aers Generic drug: Tiotropium Bromide Monohydrate Inhale 2 puffs into the lungs daily. What changed: Another medication with the same name was removed. Continue taking this medication, and follow the directions you see here.    tamsulosin 0.4 MG Caps capsule Commonly known as: FLOMAX Take 1 capsule (0.4 mg total) by mouth daily after supper. What changed: when to take this  traZODone 50 MG tablet Commonly known as: DESYREL Take 50 mg by mouth at bedtime.    vitamin D3 25 MCG tablet Commonly known as: CHOLECALCIFEROL Take 1 tablet (1,000 Units total) by mouth daily.      Relevant Imaging Results:  Relevant Lab Results:   Additional Information SSN: [id number] [place], [place]

## 2023-11-01 NOTE — Progress Notes (Signed)
 Physical Therapy Treatment Patient Details Name: Donald [name]. MRN: [id number] DOB: 1961/08/14 Today's Date: 11/01/2023   History of Present Illness Pt admitted to [facility] on 10/25/23 for c/o mechanical fall at ALF, resulting in LLE pain. Imaging significant for L femur fx now s/p L ORIF on 2/21 by Dr. Allena Katz. Significant PMH includes: HLD, HTN, neuropathy, BPH, PAD, CKD 3A, moderate COPD, cigarette/tobacco user, hx of multiple CVA, insomnia, alcohol dependence with history of delirium tremens.    PT Comments  Pt was pleasant and motivated to participate during the session and put forth good effort throughout. Pt required no physical assistance during functional task training and was generally steady with no overt LOB during gait with a RW.  Pt participate with below balance training tasks with only occasional min instability that he was able to self-correct.  Pt reported no adverse symptoms during the session other than mod L hip pain with SpO2 and HR WNL throughout on room air.  Pt will benefit from continued PT services upon discharge to safely address deficits listed in patient problem list for decreased caregiver assistance and eventual return to PLOF.       If plan is discharge home, recommend the following: A little help with walking and/or transfers;A little help with bathing/dressing/bathroom   Can travel by private vehicle        Equipment Recommendations  None recommended by PT    Recommendations for Other Services       Precautions / Restrictions Restrictions Weight Bearing Restrictions Per Provider Order: Yes LLE Weight Bearing Per Provider Order: Weight bearing as tolerated     Mobility  Bed Mobility Overal bed mobility: Modified Independent             General bed mobility comments: Min extra time and effort only    Transfers Overall transfer level: Needs assistance Equipment used: Rolling walker (2 wheels) Transfers: Sit to/from Stand Sit to Stand:  Supervision           General transfer comment: Good eccentric and concentric control and stability    Ambulation/Gait Ambulation/Gait assistance: Supervision Gait Distance (Feet): 125 Feet Assistive device: Rolling walker (2 wheels) Gait Pattern/deviations: Step-through pattern, Decreased step length - right, Decreased step length - left, Antalgic Gait velocity: decreased     General Gait Details: Only mildy reduced cadence and minimally antalgic on the LLE   Stairs             Wheelchair Mobility     Tilt Bed    Modified Rankin (Stroke Patients Only)       Balance Overall balance assessment: Needs assistance   Sitting balance-Leahy Scale: Normal     Standing balance support: No upper extremity supported Standing balance-Leahy Scale: Good                              Communication Communication Communication: No apparent difficulties  Cognition Arousal: Alert Behavior During Therapy: WFL for tasks assessed/performed   PT - Cognitive impairments: No apparent impairments                         Following commands: Intact      Cueing Cueing Techniques: Verbal cues  Exercises Other Exercises Other Exercises: Static standing balance training with feet apart, together, and semi-tandem with combinations of EO/EC and head still/head turns Other Exercises: dynamic standing balance training with feet apart, together, and semi-tanem with  reaching outside BOS    General Comments        Pertinent Vitals/Pain Pain Assessment Pain Assessment: 0-10 Pain Score: 6  Pain Location: L hip Pain Descriptors / Indicators: Sore Pain Intervention(s): Premedicated before session, Repositioned, Monitored during session    Home Living                          Prior Function            PT Goals (current goals can now be found in the care plan section) Progress towards PT goals: Progressing toward goals    Frequency     7X/week      PT Plan      Co-evaluation              AM-PAC PT "6 Clicks" Mobility   Outcome Measure  Help needed turning from your back to your side while in a flat bed without using bedrails?: None Help needed moving from lying on your back to sitting on the side of a flat bed without using bedrails?: None Help needed moving to and from a bed to a chair (including a wheelchair)?: A Little Help needed standing up from a chair using your arms (e.g., wheelchair or bedside chair)?: A Little Help needed to walk in hospital room?: A Little Help needed climbing 3-5 steps with a railing? : A Little 6 Click Score: 20    End of Session Equipment Utilized During Treatment: Gait belt Activity Tolerance: Patient tolerated treatment well Patient left: in bed;with call bell/phone within reach;with bed alarm set (Pt declined up in chair) Nurse Communication: Mobility status PT Visit Diagnosis: Muscle weakness (generalized) (M62.81);Pain;Difficulty in walking, not elsewhere classified (R26.2) Pain - Right/Left: Left Pain - part of body: Hip;Knee     Time: 9604-5409 PT Time Calculation (min) (ACUTE ONLY): 23 min  Charges:    $Gait Training: 8-22 mins $Therapeutic Exercise: 8-22 mins PT General Charges $$ ACUTE PT VISIT: 1 Visit                    D. Scott Shaheed Schmuck PT, DPT 11/01/23, 11:20 AM

## 2023-11-01 NOTE — Progress Notes (Signed)
 Subjective: 3 Days Post-Op Procedure(s) (LRB): INTRAMEDULLARY (IM) NAIL INTERTROCHANTERIC (Left) Patient reports pain as mild.   Patient is well, and has had no acute complaints or problems Denies any CP, SOB, ABD pain. +BM We will continue with physical therapy today.   Objective: Vital signs in last 24 hours: Temp:  [98.1 F (36.7 C)-98.5 F (36.9 C)] 98.1 F (36.7 C) (02/24 0418) Pulse Rate:  [78-84] 84 (02/24 0418) Resp:  [16-18] 16 (02/23 1632) BP: (108-129)/(62-73) 129/73 (02/24 0418) SpO2:  [95 %-96 %] 96 % (02/24 0418)  Intake/Output from previous day: No intake/output data recorded. Intake/Output this shift: No intake/output data recorded.  Recent Labs    10/30/23 0446 10/31/23 0330  HGB 8.3* 8.0*   Recent Labs    10/30/23 0446 10/31/23 0330  WBC 8.3 12.3*  RBC 2.60* 2.48*  HCT 24.6* 23.8*  PLT 163 191   Recent Labs    10/31/23 0330 11/01/23 0459  NA 134* 135  K 4.8 4.2  CL 103 105  CO2 23 23  BUN 32* 31*  CREATININE 1.83* 1.53*  GLUCOSE 114* 91  CALCIUM 8.5* 8.3*   No results for input(s): "LABPT", "INR" in the last 72 hours.  EXAM General - Patient is Alert, Appropriate, and Oriented Extremity - Neurovascular intact Sensation intact distally Intact pulses distally Dorsiflexion/Plantar flexion intact Dressing - dressing C/D/I and no drainage Motor Function - intact, moving foot and toes well on exam.  Ambulated 40 feet with physical therapy on Saturday.  Past Medical History:  Diagnosis Date   Depression    Emphysema of lung (HCC)    Essential hypertension    History of chicken pox    Hyponatremia    Insomnia    Low weight    MRSA nasal colonization    Neuropathy    bilateral lower legs   Peripheral vascular disease (HCC)    Pneumonia    Prostate disease    Seasonal allergies    Seizures (HCC)    Sensory ataxia    Sepsis (HCC)    Stroke (HCC)    Wears dentures    full upper    Assessment/Plan:   3 Days Post-Op  Procedure(s) (LRB): INTRAMEDULLARY (IM) NAIL INTERTROCHANTERIC (Left) Principal Problem:   Left leg weakness Active Problems:   Essential hypertension   Benign prostatic hyperplasia   PAD (peripheral artery disease) (HCC)   Peripheral neuropathy   Alcohol dependence, in remission (HCC)   Mononeuropathy of both lower extremities   Benign neoplasm of transverse colon   Insomnia   DTs (delirium tremens) (HCC)   Protein-calorie malnutrition, severe   Acute CVA (cerebrovascular accident) (HCC)   Atherosclerosis of native arteries of the extremities with gangrene (HCC)   Left leg pain   Tobacco use   CKD stage 3a, GFR 45-59 ml/min (HCC)   COPD (chronic obstructive pulmonary disease) (HCC)  Estimated body mass index is 16.27 kg/m as calculated from the following:   Height as of this encounter: 5\' 8"  (1.727 m).   Weight as of this encounter: 48.5 kg. Advance diet Up with therapy Pain well controlled + BM VSS Acute post op blood loss anemia with underlying chronic anemia - Hgb 8.0. oral Fe.  CM to assist with discharge  Discharge planning: Follow-up at [facility] orthopedics in 2 weeks for staple removal and x-rays of the left hip.   DVT Prophylaxis - TED hose and Eliquis SCDS Weight-Bearing as tolerated to left leg   Dedra Skeens, PA-C [facility]  Orthopaedics 11/01/2023, 7:32 AM

## 2023-11-01 NOTE — Plan of Care (Signed)
  Problem: Skin Integrity: Goal: Risk for impaired skin integrity will decrease Outcome: Progressing   

## 2023-11-01 NOTE — TOC Progression Note (Signed)
 Transition of Care (TOC) - Progression Note    Patient Details  Name: Donald [name]. MRN: [id number] Date of Birth: 11/05/1960  Transition of Care [facility]) CM/SW Contact  Erin Sons, [place] Phone Number: 11/01/2023, 1:32 PM  Clinical Narrative:      Pt assessed by his ALF today. Pt can return. Fl2 and DC summary faxed to [phone]. [facility] will provide transport at DC.      Expected Discharge Plan and Services         Expected Discharge Date: 11/01/23                                     Social Determinants of Health (SDOH) Interventions SDOH Screenings   Food Insecurity: No Food Insecurity (10/26/2023)  Housing: Low Risk  (10/26/2023)  Transportation Needs: No Transportation Needs (10/26/2023)  Utilities: Not At Risk (10/26/2023)  Depression (PHQ2-9): Low Risk  (02/28/2019)  Financial Resource Strain: Low Risk  (10/12/2023)   Received from [facility]  Physical Activity: Unknown (03/13/2019)  Social Connections: Unknown (10/26/2023)  Tobacco Use: Medium Risk (10/29/2023)    Readmission Risk Interventions     No data to display

## 2023-11-01 NOTE — Discharge Summary (Signed)
 Physician Discharge Summary   Patient: Donald [name]. MRN: [id number]  DOB: 04-23-1961   Admit:     Date of Admission: 10/25/2023 Admitted from: assisted living    Discharge: Date of discharge: 11/01/23 Disposition: Assisted living Condition at discharge: good  CODE STATUS: DNR     Discharge Physician: Sunnie Nielsen, DO [facility]     PCP: Ellan Lambert, NP  Recommendations for Outpatient Follow-up:  Follow up with PCP Ellan Lambert, NP in 1-2 weeks Please follow up as directed w orthopedic surgery in 2 weeks    Discharge Instructions     Diet - low sodium heart healthy   Complete by: As directed    Increase activity slowly   Complete by: As directed          Discharge Diagnoses: Principal Problem:   Left leg weakness Active Problems:   DTs (delirium tremens) (HCC)   Essential hypertension   Benign prostatic hyperplasia   PAD (peripheral artery disease) (HCC)   Peripheral neuropathy   Alcohol dependence, in remission (HCC)   Mononeuropathy of both lower extremities   Benign neoplasm of transverse colon   Insomnia   Protein-calorie malnutrition, severe   Acute CVA (cerebrovascular accident) (HCC)   Atherosclerosis of native arteries of the extremities with gangrene (HCC)   Left leg pain   Tobacco use   CKD stage 3a, GFR 45-59 ml/min (HCC)   COPD (chronic obstructive pulmonary disease) [facility])       Hospital course / significant events:   HPI:  [name], is a 63 year old male with history of hyperlipidemia, hypertension, neuropathy, BPH, PAD, CKD 3A, moderate COPD, cigarette/tobacco user, history of CVA, insomnia, alcohol dependence with history of delirium tremens, who presents to the emergency department for chief concerns of left leg weakness. He reports that at baseline he ambulates without a walker. He endorses that he does live at assisted living due to having had 8 strokes in the past. He reports that  yesterday, he fell because he was opening the door to his room when he suddenly felt left leg weakness, his left leg giving out on him and he fell. He denies loss of consciousness. He states that the weakness of his LLE primarily involves his thigh, with difficulty flexing his leg at the hip, but also with significant weakness of leg extension. He denies sensory loss in either of his lower extremities.   02/17: admitted to hospitalist service w/ concern for TIA/CVA. MRI brain showed nothing acute, (+)extensive chronic small vessel eischemic changes. LLE [place] Neg CVT. MRI L-spine (+)degenerative changes w/o high grade canal/foraminal stenosis  02/18: XR hip (+)degenerative joint disease no fracture/dislocation, Neurology saw pt - "Exam reveals focal weakness of his LLE, significantly worse proximally with 2-3/5 hip flexion, 3/5 hip adduction, 4-/5 knee extension and flexion, 4/5 ADF and APF." "DDx for focal proximal > distal LLE weakness... Includes lumbosacral plexopathy, focal myopathy, iliopsoas focal lesion such as a hematoma or mass, and focal ischemic nerve injury of the femoral nerve... Per literature search, intermittent claudication is one cause of hip and thigh pain with weakness of hip flexion." Recs for MRI L thigh to assess for myopathy, which was ordered but not done this day. CT pelvis advised. CK level not collected. Hold statin. Consider vascular surgery consult/followup.  02/19: MRI L thigh/femur pending read. CT no iliopsoas abn but is showing some occlusive disease in bilateral common iliac and external iliac stents - I've asked vascular  to weigh in on whether or to what extent this might be causing symptoms.  02/20: MRI (+)L femur fx, ortho to take to OR tomorrow. Vacsular saw pt, no concerns.  02/21: to OR for nail fixation L femur 02/22: Stable for discharge but his facility will not take him back - pending eval Mon 02/24 02/23: stable, mild AKI 02/24: stable, renal function improved,  his facility accepted him back     Consultants:  Neurology  Vascular Surgery Orthopedic Surgery   Procedures/Surgeries: 10/29/23 left intramedullary intertrochanteric nail placement - Dr Signa Kell      ASSESSMENT & PLAN:   Acute nondisplaced left intertrochanteric femur fracture.  Potential for LLR weakness (see below) led to fall and still need w/u Ortho following outpatient in 2 weeks  PT/OT to follow after surgery / per ortho  Left leg weakness Have reasonably ruled out CVA, lumbar disc disease / nerve compression Ddx Includes lumbosacral plexopathy, focal myopathy, iliopsoas focal lesion such as a hematoma or mass, focal ischemic nerve injury of the femoral nerve, intermittent claudication  Neurology has s/o  We were holding statin but can restart  Consider outpatient Neurology referral for EMG/NCS if postop course still demonstrating significant weakness    PAD Normal ABIs last month and palpable distal pulses in left leg, do not think left leg dysfunction is 2/2 arterial disease Restart statin  Restarted abixiban  Vascular consult to comment on posisbility claudication may be causing weakness --> no intervention, follow routinely outpatient, suspect neuropathy more a contributor, less so any claudication problem   History CVA MRI brain neg for acute stroke restart statin  restart apixaban    AKI Low po intake Po hydration Follow BMP   HTN Here bp mild elevation, also tachycardic in setting of holding home dilt and metop resume home metop and dilt   CKD 3a Kidney function is at baseline.  repeat bmp   Insomnia home amitryptyline   BPH home proscar, flomax   COPD Quiescent incruse for home spiriva   Neuropathy home gabapentin - reduced dose per pharmacy recs given CrCl   GAD home meds  History alcoholism Reports last drink 5 years ago No intervention              Discharge Instructions  Allergies as of 11/01/2023        Reactions   Lidocaine Other (See Comments)   unknown        Medication List     STOP taking these medications    hydrALAZINE 50 MG tablet Commonly known as: APRESOLINE   lidocaine 5 % Commonly known as: Lidoderm   losartan 25 MG tablet Commonly known as: COZAAR       TAKE these medications    acetaminophen 500 MG tablet Commonly known as: TYLENOL Take 2 tablets (1,000 mg total) by mouth every 8 (eight) hours. What changed:  how much to take when to take this reasons to take this   albuterol 108 (90 Base) MCG/ACT inhaler Commonly known as: VENTOLIN HFA Inhale 2 puffs into the lungs every 6 (six) hours as needed for wheezing or shortness of breath.   amitriptyline 10 MG tablet Commonly known as: ELAVIL Take 10 mg by mouth at bedtime.   ascorbic acid 250 MG tablet Commonly known as: VITAMIN C Take 1 tablet (250 mg total) by mouth 2 (two) times daily.   atorvastatin 10 MG tablet Commonly known as: LIPITOR Take 10 mg by mouth daily.   diltiazem 240 MG 24 hr  capsule Commonly known as: CARDIZEM CD Take 1 capsule (240 mg total) by mouth daily.   docusate sodium 100 MG capsule Commonly known as: COLACE Take 100 mg by mouth daily.   Eliquis 2.5 MG Tabs tablet Generic drug: apixaban Take 2.5 mg by mouth 2 (two) times daily. What changed: Another medication with the same name was removed. Continue taking this medication, and follow the directions you see here.   ferrous sulfate 325 (65 FE) MG EC tablet Take 325 mg by mouth daily at 6 (six) AM.   finasteride 5 MG tablet Commonly known as: PROSCAR Take 1 tablet (5 mg total) by mouth daily.   gabapentin 400 MG capsule Commonly known as: NEURONTIN Take 400 mg by mouth 3 (three) times daily. Take in addition to one 100 mg capsule for total 500 mg three times daily What changed: Another medication with the same name was removed. Continue taking this medication, and follow the directions you see here.    magnesium oxide 400 (240 Mg) MG tablet Commonly known as: MAG-OX Take 400 mg by mouth daily.   melatonin 5 MG Tabs Take 10 mg by mouth at bedtime.   methocarbamol 500 MG tablet Commonly known as: ROBAXIN Take 500 mg by mouth every 8 (eight) hours as needed for muscle spasms.   metoprolol succinate 50 MG 24 hr tablet Commonly known as: TOPROL-XL Take 2 tablets (100 mg total) by mouth daily. What changed: how much to take   multivitamin with minerals tablet Take 1 tablet by mouth daily.   naproxen 500 MG tablet Commonly known as: NAPROSYN Take 500 mg by mouth 2 (two) times daily.   nicotine 21 mg/24hr patch Commonly known as: NICODERM CQ - dosed in mg/24 hours Place 1 patch (21 mg total) onto the skin daily as needed (nicotine craving).   oxyCODONE 5 MG immediate release tablet Commonly known as: Oxy IR/ROXICODONE Take 0.5-1 tablets (2.5-5 mg total) by mouth every 4 (four) hours as needed for moderate pain (pain score 4-6) (pain score 4-6).   oxyCODONE-acetaminophen 10-325 MG tablet Commonly known as: PERCOCET Take 1 tablet by mouth every 6 (six) hours as needed.   Refresh Digital 0.5-1-0.5 % Soln Generic drug: Carboxymeth-Glycerin-Polysorb Apply to eye.   Spiriva Respimat 2.5 MCG/ACT Aers Generic drug: Tiotropium Bromide Monohydrate Inhale 2 puffs into the lungs daily. What changed: Another medication with the same name was removed. Continue taking this medication, and follow the directions you see here.   tamsulosin 0.4 MG Caps capsule Commonly known as: FLOMAX Take 1 capsule (0.4 mg total) by mouth daily after supper. What changed: when to take this   traZODone 50 MG tablet Commonly known as: DESYREL Take 50 mg by mouth at bedtime.   vitamin D3 25 MCG tablet Commonly known as: CHOLECALCIFEROL Take 1 tablet (1,000 Units total) by mouth daily.         Follow-up Information     Dedra Skeens, PA-C Follow up in 2 week(s).   Specialty: Orthopedic  Surgery Contact information: [address] [place] [address] [phone]         Ellan Lambert, NP Follow up.   Specialty: Adult Health Nurse Practitioner Why: hospital follow up in 4-6 weeks Contact information: [address] [place] [address] [phone]                 Allergies  Allergen Reactions   Lidocaine Other (See Comments)    unknown  Subjective: pt feeling well this morning, no concerns for discharge, eager to get out of the hospital/ Tolerating diet, no CP/SOB, pain is controlled   Discharge Exam: BP (!) 151/79 (BP Location: Right Arm)   Pulse 84   Temp 98.2 F (36.8 C)   Resp 18   Ht 5\' 8"  (1.727 m)   Wt 48.5 kg   SpO2 99%   BMI 16.27 kg/m  General: Pt is alert, awake, not in acute distress Cardiovascular: RRR, S1/S2 +, no rubs, no gallops Respiratory: CTA bilaterally, no wheezing, no rhonchi Abdominal: Soft, NT, ND, bowel sounds + Extremities: no edema, no cyanosis     The results of significant diagnostics from this hospitalization (including imaging, microbiology, ancillary and laboratory) are listed below for reference.     Microbiology: Recent Results (from the past 240 hours)  Resp panel by RT-PCR (RSV, Flu A&B, Covid) Anterior Nasal Swab     Status: None   Collection Time: 10/25/23  2:41 PM   Specimen: Anterior Nasal Swab  Result Value Ref Range Status   SARS Coronavirus 2 by RT PCR NEGATIVE NEGATIVE Final    Comment: (NOTE) SARS-CoV-2 target nucleic acids are NOT DETECTED.  The SARS-CoV-2 RNA is generally detectable in upper respiratory specimens during the acute phase of infection. The lowest concentration of SARS-CoV-2 viral copies this assay can detect is 138 copies/mL. A negative result does not preclude SARS-Cov-2 infection and should not be used as the sole basis for treatment or other patient management decisions. A negative result may occur with   improper specimen collection/handling, submission of specimen other than nasopharyngeal swab, presence of viral mutation(s) within the areas targeted by this assay, and inadequate number of viral copies(<138 copies/mL). A negative result must be combined with clinical observations, patient history, and epidemiological information. The expected result is Negative.  Fact Sheet for Patients:  BloggerCourse.com  Fact Sheet for Healthcare Providers:  SeriousBroker.it  This test is no t yet approved or cleared by the [place] FDA and  has been authorized for detection and/or diagnosis of SARS-CoV-2 by FDA under an Emergency Use Authorization (EUA). This EUA will remain  in effect (meaning this test can be used) for the duration of the COVID-19 declaration under Section 564(b)(1) of the Act, 21 U.S.C.section 360bbb-3(b)(1), unless the authorization is terminated  or revoked sooner.       Influenza A by PCR NEGATIVE NEGATIVE Final   Influenza B by PCR NEGATIVE NEGATIVE Final    Comment: (NOTE) The Xpert Xpress SARS-CoV-2/FLU/RSV plus assay is intended as an aid in the diagnosis of influenza from Nasopharyngeal swab specimens and should not be used as a sole basis for treatment. Nasal washings and aspirates are unacceptable for Xpert Xpress SARS-CoV-2/FLU/RSV testing.  Fact Sheet for Patients: BloggerCourse.com  Fact Sheet for Healthcare Providers: SeriousBroker.it  This test is not yet approved or cleared by the [place] FDA and has been authorized for detection and/or diagnosis of SARS-CoV-2 by FDA under an Emergency Use Authorization (EUA). This EUA will remain in effect (meaning this test can be used) for the duration of the COVID-19 declaration under Section 564(b)(1) of the Act, 21 U.S.C. section 360bbb-3(b)(1), unless the authorization is terminated or revoked.      Resp Syncytial Virus by PCR NEGATIVE NEGATIVE Final    Comment: (NOTE) Fact Sheet for Patients: BloggerCourse.com  Fact Sheet for Healthcare Providers: SeriousBroker.it  This test is not yet approved or cleared by the [place] and has been authorized for  detection and/or diagnosis of SARS-CoV-2 by FDA under an Emergency Use Authorization (EUA). This EUA will remain in effect (meaning this test can be used) for the duration of the COVID-19 declaration under Section 564(b)(1) of the Act, 21 U.S.C. section 360bbb-3(b)(1), unless the authorization is terminated or revoked.  Performed at [facility], [address] [place], [place] [address]   MRSA Next Gen by PCR, Nasal     Status: None   Collection Time: 10/26/23 12:47 PM   Specimen: Nasal Mucosa; Nasal Swab  Result Value Ref Range Status   MRSA by PCR Next Gen NOT DETECTED NOT DETECTED Final    Comment: (NOTE) The GeneXpert MRSA Assay (FDA approved for NASAL specimens only), is one component of a comprehensive MRSA colonization surveillance program. It is not intended to diagnose MRSA infection nor to guide or monitor treatment for MRSA infections. Test performance is not FDA approved in patients less than 44 years old. Performed at [facility], [address] [place], [place] [address]      Labs: BNP (last 3 results) No results for input(s): "BNP" in the last 8760 hours. Basic Metabolic Panel: Recent Labs  Lab 10/28/23 0452 10/29/23 0521 10/30/23 0446 10/31/23 0330 11/01/23 0459  NA 138 136 136 134* 135  K 3.4* 3.8 4.5 4.8 4.2  CL 108 104 105 103 105  CO2 23 24 23 23 23   GLUCOSE 96 92 127* 114* 91  BUN 25* 17 22* 32* 31*  CREATININE 1.38* 1.30* 1.57* 1.83* 1.53*  CALCIUM 8.7* 8.7* 8.6* 8.5* 8.3*   Liver Function Tests: No results for input(s): "AST", "ALT", "ALKPHOS", "BILITOT", "PROT", "ALBUMIN" in the last 168 hours. No  results for input(s): "LIPASE", "AMYLASE" in the last 168 hours. No results for input(s): "AMMONIA" in the last 168 hours. CBC: Recent Labs  Lab 10/26/23 0454 10/30/23 0446 10/31/23 0330  WBC 7.4 8.3 12.3*  HGB 10.3* 8.3* 8.0*  HCT 30.9* 24.6* 23.8*  MCV 97.2 94.6 96.0  PLT 155 163 191   Cardiac Enzymes: Recent Labs  Lab 10/27/23 0521  CKTOTAL 44*   BNP: Invalid input(s): "POCBNP" CBG: No results for input(s): "GLUCAP" in the last 168 hours. D-Dimer No results for input(s): "DDIMER" in the last 72 hours. Hgb A1c No results for input(s): "HGBA1C" in the last 72 hours. Lipid Profile No results for input(s): "CHOL", "HDL", "LDLCALC", "TRIG", "CHOLHDL", "LDLDIRECT" in the last 72 hours. Thyroid function studies No results for input(s): "TSH", "T4TOTAL", "T3FREE", "THYROIDAB" in the last 72 hours.  Invalid input(s): "FREET3" Anemia work up No results for input(s): "VITAMINB12", "FOLATE", "FERRITIN", "TIBC", "IRON", "RETICCTPCT" in the last 72 hours. Urinalysis    Component Value Date/Time   COLORURINE YELLOW (A) 10/25/2023 1802   APPEARANCEUR CLEAR (A) 10/25/2023 1802   APPEARANCEUR Clear 11/18/2011 1958   LABSPEC 1.023 10/25/2023 1802   LABSPEC 1.002 11/18/2011 1958   PHURINE 5.0 10/25/2023 1802   GLUCOSEU NEGATIVE 10/25/2023 1802   GLUCOSEU Negative 11/18/2011 1958   HGBUR NEGATIVE 10/25/2023 1802   BILIRUBINUR NEGATIVE 10/25/2023 1802   BILIRUBINUR Negative 11/18/2011 1958   KETONESUR 5 (A) 10/25/2023 1802   PROTEINUR NEGATIVE 10/25/2023 1802   NITRITE NEGATIVE 10/25/2023 1802   LEUKOCYTESUR NEGATIVE 10/25/2023 1802   LEUKOCYTESUR Negative 11/18/2011 1958   Sepsis Labs Recent Labs  Lab 10/26/23 0454 10/30/23 0446 10/31/23 0330  WBC 7.4 8.3 12.3*   Microbiology Recent Results (from the past 240 hours)  Resp panel by RT-PCR (RSV, Flu A&B, Covid) Anterior Nasal  Swab     Status: None   Collection Time: 10/25/23  2:41 PM   Specimen: Anterior Nasal Swab   Result Value Ref Range Status   SARS Coronavirus 2 by RT PCR NEGATIVE NEGATIVE Final    Comment: (NOTE) SARS-CoV-2 target nucleic acids are NOT DETECTED.  The SARS-CoV-2 RNA is generally detectable in upper respiratory specimens during the acute phase of infection. The lowest concentration of SARS-CoV-2 viral copies this assay can detect is 138 copies/mL. A negative result does not preclude SARS-Cov-2 infection and should not be used as the sole basis for treatment or other patient management decisions. A negative result may occur with  improper specimen collection/handling, submission of specimen other than nasopharyngeal swab, presence of viral mutation(s) within the areas targeted by this assay, and inadequate number of viral copies(<138 copies/mL). A negative result must be combined with clinical observations, patient history, and epidemiological information. The expected result is Negative.  Fact Sheet for Patients:  BloggerCourse.com  Fact Sheet for Healthcare Providers:  SeriousBroker.it  This test is no t yet approved or cleared by the [place] FDA and  has been authorized for detection and/or diagnosis of SARS-CoV-2 by FDA under an Emergency Use Authorization (EUA). This EUA will remain  in effect (meaning this test can be used) for the duration of the COVID-19 declaration under Section 564(b)(1) of the Act, 21 U.S.C.section 360bbb-3(b)(1), unless the authorization is terminated  or revoked sooner.       Influenza A by PCR NEGATIVE NEGATIVE Final   Influenza B by PCR NEGATIVE NEGATIVE Final    Comment: (NOTE) The Xpert Xpress SARS-CoV-2/FLU/RSV plus assay is intended as an aid in the diagnosis of influenza from Nasopharyngeal swab specimens and should not be used as a sole basis for treatment. Nasal washings and aspirates are unacceptable for Xpert Xpress SARS-CoV-2/FLU/RSV testing.  Fact Sheet for  Patients: BloggerCourse.com  Fact Sheet for Healthcare Providers: SeriousBroker.it  This test is not yet approved or cleared by the [place] FDA and has been authorized for detection and/or diagnosis of SARS-CoV-2 by FDA under an Emergency Use Authorization (EUA). This EUA will remain in effect (meaning this test can be used) for the duration of the COVID-19 declaration under Section 564(b)(1) of the Act, 21 U.S.C. section 360bbb-3(b)(1), unless the authorization is terminated or revoked.     Resp Syncytial Virus by PCR NEGATIVE NEGATIVE Final    Comment: (NOTE) Fact Sheet for Patients: BloggerCourse.com  Fact Sheet for Healthcare Providers: SeriousBroker.it  This test is not yet approved or cleared by the [place] FDA and has been authorized for detection and/or diagnosis of SARS-CoV-2 by FDA under an Emergency Use Authorization (EUA). This EUA will remain in effect (meaning this test can be used) for the duration of the COVID-19 declaration under Section 564(b)(1) of the Act, 21 U.S.C. section 360bbb-3(b)(1), unless the authorization is terminated or revoked.  Performed at [facility], [address] [place], [place] [address]   MRSA Next Gen by PCR, Nasal     Status: None   Collection Time: 10/26/23 12:47 PM   Specimen: Nasal Mucosa; Nasal Swab  Result Value Ref Range Status   MRSA by PCR Next Gen NOT DETECTED NOT DETECTED Final    Comment: (NOTE) The GeneXpert MRSA Assay (FDA approved for NASAL specimens only), is one component of a comprehensive MRSA colonization surveillance program. It is not intended to diagnose MRSA infection nor to guide or monitor treatment for MRSA infections. Test performance is not  FDA approved in patients less than 57 years old. Performed at [facility], [address] [place], [place] [address]     Imaging DG HIP UNILAT WITH PELVIS 2-3 VIEWS LEFT Result Date: 10/26/2023 CLINICAL DATA:  Left hip pain after injury yesterday EXAM: DG HIP (WITH OR WITHOUT PELVIS) 2-3V LEFT COMPARISON:  None Available. FINDINGS: There is no evidence of hip fracture or dislocation. Mild osteophyte formation of the left hip is noted. 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 No acute abnormality seen. Electronically Signed   By: Lupita Raider M.D.   On: 10/26/2023 17:20   MR BRAIN WO CONTRAST Result Date: 10/25/2023 CLINICAL DATA:  Neuro deficit, acute, stroke suspected. Left leg weakness beginning yesterday. EXAM: MRI HEAD WITHOUT CONTRAST TECHNIQUE: Multiplanar, multiecho pulse sequences of the brain and surrounding structures were obtained without intravenous contrast. COMPARISON:  Head CT same day.  MRI 03/22/2021 FINDINGS: Brain: Diffusion imaging does not show any acute or subacute infarction or other cause of restricted diffusion. Chronic small-vessel ischemic changes affect the pons. No focal cerebellar insult. There are numerous old small vessel infarctions within both thalami. Extensive chronic small-vessel ischemic changes present throughout the hemispheric white matter. No large vessel territory stroke. No mass, hemorrhage, hydrocephalus or extra-axial collection. Compared to the study of 2020, findings appear quite similar. Vascular: Major vessels at the base of the brain show flow. Skull and upper cervical spine: Negative Sinuses/Orbits: Mild seasonal mucosal thickening. No advanced sinus disease. Orbits negative. Other: None IMPRESSION: No acute finding by MRI. Extensive chronic small-vessel ischemic changes of the pons, thalami and hemispheric white matter. No change since 2020. Electronically Signed   By: Paulina Fusi M.D.   On: 10/25/2023 18:21   US Venous Img Lower Unilateral Left (DVT) Result Date: 10/25/2023 CLINICAL DATA:  Left lower extremity pain. EXAM: LEFT LOWER EXTREMITY VENOUS  DOPPLER ULTRASOUND TECHNIQUE: Gray-scale sonography with graded compression, as well as color Doppler and duplex ultrasound were performed to evaluate the lower extremity deep venous systems from the level of the common femoral vein and including the common femoral, femoral, profunda femoral, popliteal and calf veins including the posterior tibial, peroneal and gastrocnemius veins when visible. The superficial great saphenous vein was also interrogated. Spectral Doppler was utilized to evaluate flow at rest and with distal augmentation maneuvers in the common femoral, femoral and popliteal veins. COMPARISON:  None Available. FINDINGS: Contralateral Common Femoral Vein: Respiratory phasicity is normal and symmetric with the symptomatic side. No evidence of thrombus. Normal compressibility. Common Femoral Vein: No evidence of thrombus. Normal compressibility, respiratory phasicity and response to augmentation. Saphenofemoral Junction: No evidence of thrombus. Normal compressibility and flow on color Doppler imaging. Profunda Femoral Vein: No evidence of thrombus. Normal compressibility and flow on color Doppler imaging. Femoral Vein: No evidence of thrombus. Normal compressibility, respiratory phasicity and response to augmentation. Popliteal Vein: No evidence of thrombus. Normal compressibility, respiratory phasicity and response to augmentation. Calf Veins: No evidence of thrombus. Normal compressibility and flow on color Doppler imaging. Superficial Great Saphenous Vein: No evidence of thrombus. Normal compressibility. Venous Reflux:  None. Other Findings: No evidence of superficial thrombophlebitis or abnormal fluid collection. IMPRESSION: No evidence of left lower extremity deep venous thrombosis. Electronically Signed   By: Irish Lack M.D.   On: 10/25/2023 17:03   DG Chest Port 1 View Result Date: 10/25/2023 CLINICAL DATA:  Shortness of breath with exertion.  Weakness. EXAM: PORTABLE CHEST 1 VIEW  COMPARISON:  05/31/2023 FINDINGS: Heart size is  normal. Chronic aortic atherosclerosis is noted. Chronic pulmonary scarring, most prominent in the left lower lobe. Findings appear similar to the study of September. Cannot rule out some coexistence left base pneumonia, but most if not all the findings in this area appear chronic. No pleural effusion. No acute bone finding. IMPRESSION: Chronic pulmonary scarring, most prominent in the left lower lobe. Cannot exclude some coexistent left base pneumonia, but most if not all the findings in this area appear chronic. Electronically Signed   By: Paulina Fusi M.D.   On: 10/25/2023 15:09   CT Head Wo Contrast Result Date: 10/25/2023 CLINICAL DATA:  Neuro deficit, acute, stroke suspected. Fell yesterday. Bilateral leg pain. EXAM: CT HEAD WITHOUT CONTRAST TECHNIQUE: Contiguous axial images were obtained from the base of the skull through the vertex without intravenous contrast. RADIATION DOSE REDUCTION: This exam was performed according to the departmental dose-optimization program which includes automated exposure control, adjustment of the mA and/or kV according to patient size and/or use of iterative reconstruction technique. COMPARISON:  MRI 03/23/2019 FINDINGS: Brain: Generalized brain atrophy without subjective lobar predominance. Old small vessel infarction within the right pons. No focal cerebellar insult. Old infarction in both thalami and basal ganglia. Advanced chronic small-vessel ischemic changes of the white matter. No sign of acute infarction. No mass, hemorrhage, obstructive hydrocephalus or extra-axial collection. Ventricular size is in proportion to the degree of atrophy. Vascular: There is atherosclerotic calcification of the major vessels at the base of the brain. Skull: Negative Sinuses/Orbits: Clear/normal Other: None IMPRESSION: No acute CT finding. Atrophy and chronic small-vessel ischemic changes of the white matter. Old small vessel infarctions in  the right pons, both thalami and basal ganglia. Electronically Signed   By: Paulina Fusi M.D.   On: 10/25/2023 15:08   MR LUMBAR SPINE WO CONTRAST Result Date: 10/25/2023 CLINICAL DATA:  Lumbar radiculopathy, increased fracture risk. EXAM: MRI LUMBAR SPINE WITHOUT CONTRAST TECHNIQUE: Multiplanar, multisequence MR imaging of the lumbar spine was performed. No intravenous contrast was administered. COMPARISON:  MRI of the lumbar spine November 07, 2008. FINDINGS: Segmentation:  Standard. Alignment:  Physiologic. Vertebrae: No fracture, evidence of discitis, or bone lesion. Schmorl node in the inferior endplate of L4. Conus medullaris and cauda equina: Conus extends to the T12-L1 level. Conus and cauda equina appear normal. Paraspinal and other soft tissues: Negative. Disc levels: T12-L1: No spinal canal or neural foraminal stenosis. L1-2: Minimal disc bulge. No spinal canal or neural foraminal stenosis. L2-3: Shallow disc bulge and mild facet degenerative changes resulting in mild bilateral neural foraminal narrowing. No significant spinal canal stenosis. L3-4: Shallow disc bulge with superimposed left foraminal annular tear and mild facet degenerative changes. Mild left neural foraminal narrowing. No significant spinal canal stenosis. L4-5: Disc bulge, mild facet degenerative changes and ligamentum flavum redundancy resulting in mild bilateral neural foraminal narrowing. No significant spinal canal stenosis. L5-S1: Mild facet degenerative changes. No spinal canal or neural foraminal stenosis. IMPRESSION: 1. Mild degenerative changes of the lumbar spine with mild neural foraminal narrowing at L2-L3, L3-L4 and L4-L5. 2. No high-grade spinal canal or neural foraminal stenosis at any level. Electronically Signed   By: Baldemar Lenis M.D.   On: 10/25/2023 13:02      Time coordinating discharge: over 30 minutes  SIGNED:  Sunnie Nielsen DO [facility]

## 2023-11-02 DIAGNOSIS — I6330 Cerebral infarction due to thrombosis of unspecified cerebral artery: Secondary | ICD-10-CM | POA: Diagnosis not present

## 2023-11-02 DIAGNOSIS — N289 Disorder of kidney and ureter, unspecified: Secondary | ICD-10-CM | POA: Diagnosis not present

## 2023-11-02 DIAGNOSIS — E785 Hyperlipidemia, unspecified: Secondary | ICD-10-CM | POA: Diagnosis not present

## 2023-11-02 DIAGNOSIS — I10 Essential (primary) hypertension: Secondary | ICD-10-CM | POA: Diagnosis not present

## 2023-11-02 DIAGNOSIS — H04123 Dry eye syndrome of bilateral lacrimal glands: Secondary | ICD-10-CM | POA: Diagnosis not present

## 2023-11-02 DIAGNOSIS — N189 Chronic kidney disease, unspecified: Secondary | ICD-10-CM | POA: Diagnosis not present

## 2023-11-02 DIAGNOSIS — M549 Dorsalgia, unspecified: Secondary | ICD-10-CM | POA: Diagnosis not present

## 2023-11-02 DIAGNOSIS — J449 Chronic obstructive pulmonary disease, unspecified: Secondary | ICD-10-CM | POA: Diagnosis not present

## 2023-11-02 DIAGNOSIS — G8929 Other chronic pain: Secondary | ICD-10-CM | POA: Diagnosis not present

## 2023-11-02 DIAGNOSIS — S7292XD Unspecified fracture of left femur, subsequent encounter for closed fracture with routine healing: Secondary | ICD-10-CM | POA: Diagnosis not present

## 2023-11-02 DIAGNOSIS — G629 Polyneuropathy, unspecified: Secondary | ICD-10-CM | POA: Diagnosis not present

## 2023-11-02 DIAGNOSIS — I739 Peripheral vascular disease, unspecified: Secondary | ICD-10-CM | POA: Diagnosis not present

## 2023-11-03 DIAGNOSIS — F4322 Adjustment disorder with anxiety: Secondary | ICD-10-CM | POA: Diagnosis not present

## 2023-11-03 DIAGNOSIS — I6330 Cerebral infarction due to thrombosis of unspecified cerebral artery: Secondary | ICD-10-CM | POA: Diagnosis not present

## 2023-11-03 DIAGNOSIS — F5105 Insomnia due to other mental disorder: Secondary | ICD-10-CM | POA: Diagnosis not present

## 2023-11-03 DIAGNOSIS — F419 Anxiety disorder, unspecified: Secondary | ICD-10-CM | POA: Diagnosis not present

## 2023-11-04 ENCOUNTER — Other Ambulatory Visit: Payer: Self-pay

## 2023-11-04 ENCOUNTER — Emergency Department
Admission: EM | Admit: 2023-11-04 | Discharge: 2023-11-05 | Disposition: A | Discharge status: Home / Self Care | Payer: Medicaid Other | Attending: Emergency Medicine | Admitting: Emergency Medicine

## 2023-11-04 DIAGNOSIS — I6330 Cerebral infarction due to thrombosis of unspecified cerebral artery: Secondary | ICD-10-CM | POA: Diagnosis not present

## 2023-11-04 DIAGNOSIS — Z8673 Personal history of transient ischemic attack (TIA), and cerebral infarction without residual deficits: Secondary | ICD-10-CM | POA: Diagnosis not present

## 2023-11-04 DIAGNOSIS — R069 Unspecified abnormalities of breathing: Secondary | ICD-10-CM | POA: Diagnosis not present

## 2023-11-04 DIAGNOSIS — I129 Hypertensive chronic kidney disease with stage 1 through stage 4 chronic kidney disease, or unspecified chronic kidney disease: Secondary | ICD-10-CM | POA: Insufficient documentation

## 2023-11-04 DIAGNOSIS — R531 Weakness: Secondary | ICD-10-CM | POA: Insufficient documentation

## 2023-11-04 DIAGNOSIS — J449 Chronic obstructive pulmonary disease, unspecified: Secondary | ICD-10-CM | POA: Diagnosis not present

## 2023-11-04 DIAGNOSIS — N189 Chronic kidney disease, unspecified: Secondary | ICD-10-CM | POA: Diagnosis not present

## 2023-11-04 LAB — COMPREHENSIVE METABOLIC PANEL
ALT: 12 U/L (ref 0–44)
AST: 25 U/L (ref 15–41)
Albumin: 3.6 g/dL (ref 3.5–5.0)
Alkaline Phosphatase: 77 U/L (ref 38–126)
Anion gap: 12 (ref 5–15)
BUN: 20 mg/dL (ref 6–20)
CO2: 25 mmol/L (ref 22–32)
Calcium: 9.3 mg/dL (ref 8.9–10.3)
Chloride: 104 mmol/L (ref 98–111)
Creatinine, Ser: 1.39 mg/dL — ABNORMAL HIGH (ref 0.61–1.24)
GFR, Estimated: 58 mL/min — ABNORMAL LOW (ref >60)
Glucose, Bld: 87 mg/dL (ref 70–99)
Potassium: 4.2 mmol/L (ref 3.5–5.1)
Sodium: 141 mmol/L (ref 135–145)
Total Bilirubin: 1.5 mg/dL — ABNORMAL HIGH (ref 0.0–1.2)
Total Protein: 7.2 g/dL (ref 6.5–8.1)

## 2023-11-04 LAB — URINALYSIS, ROUTINE W REFLEX MICROSCOPIC
Bilirubin Urine: NEGATIVE
Glucose, UA: NEGATIVE mg/dL
Hgb urine dipstick: NEGATIVE
Ketones, ur: 5 mg/dL — AB
Leukocytes,Ua: NEGATIVE
Nitrite: NEGATIVE
Protein, ur: NEGATIVE mg/dL
Specific Gravity, Urine: 1.019 (ref 1.005–1.030)
pH: 6 (ref 5.0–8.0)

## 2023-11-04 LAB — CBC WITH DIFFERENTIAL/PLATELET
Abs Immature Granulocytes: 0.03 10*3/uL (ref 0.00–0.07)
Basophils Absolute: 0.1 10*3/uL (ref 0.0–0.1)
Basophils Relative: 1 %
Eosinophils Absolute: 0.3 10*3/uL (ref 0.0–0.5)
Eosinophils Relative: 4 %
HCT: 31.5 % — ABNORMAL LOW (ref 39.0–52.0)
Hemoglobin: 10.3 g/dL — ABNORMAL LOW (ref 13.0–17.0)
Immature Granulocytes: 0 %
Lymphocytes Relative: 16 %
Lymphs Abs: 1.3 10*3/uL (ref 0.7–4.0)
MCH: 32.7 pg (ref 26.0–34.0)
MCHC: 32.7 g/dL (ref 30.0–36.0)
MCV: 100 fL (ref 80.0–100.0)
Monocytes Absolute: 0.7 10*3/uL (ref 0.1–1.0)
Monocytes Relative: 9 %
Neutro Abs: 5.7 10*3/uL (ref 1.7–7.7)
Neutrophils Relative %: 70 %
Platelets: 276 10*3/uL (ref 150–400)
RBC: 3.15 MIL/uL — ABNORMAL LOW (ref 4.22–5.81)
RDW: 14.6 % (ref 11.5–15.5)
WBC: 8.2 10*3/uL (ref 4.0–10.5)
nRBC: 0 % (ref 0.0–0.2)

## 2023-11-04 MED ORDER — SODIUM CHLORIDE 0.9 % IV BOLUS
1000.0000 mL | Freq: Once | INTRAVENOUS | Status: AC
  Administered 2023-11-04: 1000 mL via INTRAVENOUS

## 2023-11-04 NOTE — ED Triage Notes (Signed)
Refer to First Nurse Note

## 2023-11-04 NOTE — ED Triage Notes (Signed)
 First Nurse Note: Pt via [facility] from [facility]. Pt had a fall yesterday and was seen and d/c. Pt c/o generalized pain. No new falls since the one he was evaluated for yesterday. Pt is A&Ox4 and NAD 210 CBG  VSS 99% on RA

## 2023-11-04 NOTE — ED Provider Notes (Signed)
 [facility] Provider Note    Event Date/Time   First MD Initiated Contact with Patient 11/04/23 [phone]     (approximate)   History   Failure To Thrive   HPI  Donald [name]. is a 63 y.o. male with a history of hyperlipidemia, hypertension, neuropathy, BPH, CKD, COPD, CVA, alcohol dependence, and PAD who presents with generalized weakness; he states he was sent in from his assisted living facility because he was not eating enough.  The patient was recently discharged to an assisted living facility after being admitted for a left femur fracture.  Per the nurse triage note, the patient had a fall yesterday and was evaluated for an discharge, however I do not see any records of this and the patient himself does not believe that he return to the hospital since his discharge on 2/24.  The patient states that he has had decreased appetite due to persistent pain from the left hip.  He believes he is taking all the pain medications that he has been prescribed.  He denies any nausea or vomiting.  He does not feel dizzy or lightheaded.  He has no shortness of breath or other acute symptoms.  He states he is feeling fine other than pain.  The past medical records.  I had seen this patient in the ED on 2/17 and admitted him for acute left leg weakness.  MRI showed a left intertrochanteric femur fracture which was not seen on prior imaging and he was taken to the operating room by orthopedics for fixation.  He was discharged to assisted living on 2/24.   Physical Exam   Triage Vital Signs: ED Triage Vitals [11/04/23 1655]  Encounter Vitals Group     BP 123/71     Systolic BP Percentile      Diastolic BP Percentile      Pulse Rate 97     Resp 17     Temp 98.1 F (36.7 C)     Temp Source Oral     SpO2 97 %     Weight 107 lb (48.5 kg)     Height 5\' 8"  (1.727 m)     Head Circumference      Peak Flow      Pain Score 7     Pain Loc      Pain Education      Exclude  from Growth Chart     Most recent vital signs: Vitals:   11/04/23 2230 11/04/23 2300  BP: (!) 142/84 (!) 165/82  Pulse: 96 84  Resp:    Temp:    SpO2: 97% 95%    General: Alert, weak and frail appearing, no distress.  CV:  Good peripheral perfusion.  Resp:  Normal effort.  Abd:  Soft and nontender.  No distention.  Other:  Dry mucous membranes.  Good range of motion to the left hip.   ED Results / Procedures / Treatments   Labs (all labs ordered are listed, but only abnormal results are displayed) Labs Reviewed  CBC WITH DIFFERENTIAL/PLATELET - Abnormal; Notable for the following components:      Result Value   RBC 3.15 (*)    Hemoglobin 10.3 (*)    HCT 31.5 (*)    All other components within normal limits  COMPREHENSIVE METABOLIC PANEL - Abnormal; Notable for the following components:   Creatinine, Ser 1.39 (*)    Total Bilirubin 1.5 (*)    GFR, Estimated 58 (*)  All other components within normal limits  URINALYSIS, ROUTINE W REFLEX MICROSCOPIC - Abnormal; Notable for the following components:   Color, Urine YELLOW (*)    APPearance CLEAR (*)    Ketones, ur 5 (*)    All other components within normal limits     EKG     RADIOLOGY     PROCEDURES:  Critical Care performed: No  Procedures   MEDICATIONS ORDERED IN ED: Medications  sodium chloride 0.9 % bolus 1,000 mL (0 mLs Intravenous Stopped 11/04/23 2231)     IMPRESSION / MDM / ASSESSMENT AND PLAN / ED COURSE  I reviewed the triage vital signs and the nursing notes.  63 year old male with PMH as noted above presents with generalized weakness and decreased appetite at his facility.  He denies any acute pain to the left hip that was recently operated on and reports that this is all ongoing since the surgery last week.  He denies any other focal symptoms.  On exam his vital signs are normal.  Physical exam is remarkable only for generalized weak and somewhat frail appearance, and dry mucous  membranes.  Differential diagnosis includes, but is not limited to, dehydration, electrolyte abnormality, AKI, other metabolic cause, medication side effects, UTI or other infection.  We will obtain labs, give a fluid bolus, and reassess.  Patient's presentation is most consistent with acute complicated illness / injury requiring diagnostic workup.  ----------------------------------------- 11:34 PM on 11/04/2023 -----------------------------------------  BMP and CBC show no acute findings.  The patient's anemia and kidney function are actually improved from prior.  Urinalysis is also negative except for small ketones.  His vital signs remained stable.  At this time, there is no indication for further ED workup or for admission.  He is stable for discharge back to his facility.  Return precautions have been provided the patient expressed understanding.   FINAL CLINICAL IMPRESSION(S) / ED DIAGNOSES   Final diagnoses:  Weakness     Rx / DC Orders   ED Discharge Orders     None        Note:  This document was prepared using Dragon voice recognition software and may include unintentional dictation errors.    Dionne Bucy, MD 11/04/23 [phone]

## 2023-11-04 NOTE — Discharge Instructions (Signed)
 Donald [name] appeared dehydrated but his lab workup was reassuring.  He has no evidence of UTI or other infection.  He received a liter of IV fluids.  He should return to the emergency department for any new, worsening, or persistent severe weakness, inability eat, worsening pain, or any other new or worsening symptoms that are concerning.

## 2023-11-05 DIAGNOSIS — I6330 Cerebral infarction due to thrombosis of unspecified cerebral artery: Secondary | ICD-10-CM | POA: Diagnosis not present

## 2023-11-05 DIAGNOSIS — Z7689 Persons encountering health services in other specified circumstances: Secondary | ICD-10-CM | POA: Diagnosis not present

## 2023-11-05 NOTE — ED Notes (Signed)
 Attempted to call report to [facility] three times at both numbers [phone] and [phone]. Both numbers went right to voice mail.

## 2023-11-06 DIAGNOSIS — I6330 Cerebral infarction due to thrombosis of unspecified cerebral artery: Secondary | ICD-10-CM | POA: Diagnosis not present

## 2023-11-06 DIAGNOSIS — Z419 Encounter for procedure for purposes other than remedying health state, unspecified: Secondary | ICD-10-CM | POA: Diagnosis not present

## 2023-11-07 DIAGNOSIS — I6330 Cerebral infarction due to thrombosis of unspecified cerebral artery: Secondary | ICD-10-CM | POA: Diagnosis not present

## 2023-11-08 DIAGNOSIS — D519 Vitamin B12 deficiency anemia, unspecified: Secondary | ICD-10-CM | POA: Diagnosis not present

## 2023-11-08 DIAGNOSIS — I6330 Cerebral infarction due to thrombosis of unspecified cerebral artery: Secondary | ICD-10-CM | POA: Diagnosis not present

## 2023-11-08 DIAGNOSIS — E782 Mixed hyperlipidemia: Secondary | ICD-10-CM | POA: Diagnosis not present

## 2023-11-08 DIAGNOSIS — R7309 Other abnormal glucose: Secondary | ICD-10-CM | POA: Diagnosis not present

## 2023-11-08 DIAGNOSIS — Z79899 Other long term (current) drug therapy: Secondary | ICD-10-CM | POA: Diagnosis not present

## 2023-11-08 DIAGNOSIS — E559 Vitamin D deficiency, unspecified: Secondary | ICD-10-CM | POA: Diagnosis not present

## 2023-11-09 DIAGNOSIS — I6330 Cerebral infarction due to thrombosis of unspecified cerebral artery: Secondary | ICD-10-CM | POA: Diagnosis not present

## 2023-11-10 DIAGNOSIS — I6330 Cerebral infarction due to thrombosis of unspecified cerebral artery: Secondary | ICD-10-CM | POA: Diagnosis not present

## 2023-11-11 DIAGNOSIS — M25552 Pain in left hip: Secondary | ICD-10-CM | POA: Diagnosis not present

## 2023-11-11 DIAGNOSIS — I6330 Cerebral infarction due to thrombosis of unspecified cerebral artery: Secondary | ICD-10-CM | POA: Diagnosis not present

## 2023-11-12 DIAGNOSIS — I6330 Cerebral infarction due to thrombosis of unspecified cerebral artery: Secondary | ICD-10-CM | POA: Diagnosis not present

## 2023-11-13 DIAGNOSIS — I6330 Cerebral infarction due to thrombosis of unspecified cerebral artery: Secondary | ICD-10-CM | POA: Diagnosis not present

## 2023-11-14 DIAGNOSIS — I6330 Cerebral infarction due to thrombosis of unspecified cerebral artery: Secondary | ICD-10-CM | POA: Diagnosis not present

## 2023-11-15 DIAGNOSIS — I6330 Cerebral infarction due to thrombosis of unspecified cerebral artery: Secondary | ICD-10-CM | POA: Diagnosis not present

## 2023-11-16 DIAGNOSIS — J449 Chronic obstructive pulmonary disease, unspecified: Secondary | ICD-10-CM | POA: Diagnosis not present

## 2023-11-16 DIAGNOSIS — I739 Peripheral vascular disease, unspecified: Secondary | ICD-10-CM | POA: Diagnosis not present

## 2023-11-16 DIAGNOSIS — N289 Disorder of kidney and ureter, unspecified: Secondary | ICD-10-CM | POA: Diagnosis not present

## 2023-11-16 DIAGNOSIS — H04123 Dry eye syndrome of bilateral lacrimal glands: Secondary | ICD-10-CM | POA: Diagnosis not present

## 2023-11-16 DIAGNOSIS — S7292XD Unspecified fracture of left femur, subsequent encounter for closed fracture with routine healing: Secondary | ICD-10-CM | POA: Diagnosis not present

## 2023-11-16 DIAGNOSIS — J439 Emphysema, unspecified: Secondary | ICD-10-CM | POA: Diagnosis not present

## 2023-11-16 DIAGNOSIS — I10 Essential (primary) hypertension: Secondary | ICD-10-CM | POA: Diagnosis not present

## 2023-11-16 DIAGNOSIS — R5381 Other malaise: Secondary | ICD-10-CM | POA: Diagnosis not present

## 2023-11-16 DIAGNOSIS — M549 Dorsalgia, unspecified: Secondary | ICD-10-CM | POA: Diagnosis not present

## 2023-11-16 DIAGNOSIS — I6330 Cerebral infarction due to thrombosis of unspecified cerebral artery: Secondary | ICD-10-CM | POA: Diagnosis not present

## 2023-11-16 DIAGNOSIS — N189 Chronic kidney disease, unspecified: Secondary | ICD-10-CM | POA: Diagnosis not present

## 2023-11-17 DIAGNOSIS — I6330 Cerebral infarction due to thrombosis of unspecified cerebral artery: Secondary | ICD-10-CM | POA: Diagnosis not present

## 2023-11-18 DIAGNOSIS — N289 Disorder of kidney and ureter, unspecified: Secondary | ICD-10-CM | POA: Diagnosis not present

## 2023-11-18 DIAGNOSIS — N1831 Chronic kidney disease, stage 3a: Secondary | ICD-10-CM | POA: Diagnosis not present

## 2023-11-18 DIAGNOSIS — I6330 Cerebral infarction due to thrombosis of unspecified cerebral artery: Secondary | ICD-10-CM | POA: Diagnosis not present

## 2023-11-18 DIAGNOSIS — Z720 Tobacco use: Secondary | ICD-10-CM | POA: Diagnosis not present

## 2023-11-18 DIAGNOSIS — I129 Hypertensive chronic kidney disease with stage 1 through stage 4 chronic kidney disease, or unspecified chronic kidney disease: Secondary | ICD-10-CM | POA: Diagnosis not present

## 2023-11-18 DIAGNOSIS — N261 Atrophy of kidney (terminal): Secondary | ICD-10-CM | POA: Diagnosis not present

## 2023-11-19 DIAGNOSIS — I6330 Cerebral infarction due to thrombosis of unspecified cerebral artery: Secondary | ICD-10-CM | POA: Diagnosis not present

## 2023-11-20 DIAGNOSIS — I6330 Cerebral infarction due to thrombosis of unspecified cerebral artery: Secondary | ICD-10-CM | POA: Diagnosis not present

## 2023-11-21 DIAGNOSIS — I6330 Cerebral infarction due to thrombosis of unspecified cerebral artery: Secondary | ICD-10-CM | POA: Diagnosis not present

## 2023-11-22 DIAGNOSIS — R7309 Other abnormal glucose: Secondary | ICD-10-CM | POA: Diagnosis not present

## 2023-11-22 DIAGNOSIS — Z79899 Other long term (current) drug therapy: Secondary | ICD-10-CM | POA: Diagnosis not present

## 2023-11-22 DIAGNOSIS — D519 Vitamin B12 deficiency anemia, unspecified: Secondary | ICD-10-CM | POA: Diagnosis not present

## 2023-11-22 DIAGNOSIS — E782 Mixed hyperlipidemia: Secondary | ICD-10-CM | POA: Diagnosis not present

## 2023-11-22 DIAGNOSIS — I6330 Cerebral infarction due to thrombosis of unspecified cerebral artery: Secondary | ICD-10-CM | POA: Diagnosis not present

## 2023-11-23 ENCOUNTER — Other Ambulatory Visit: Payer: Self-pay

## 2023-11-23 ENCOUNTER — Emergency Department
Admission: EM | Admit: 2023-11-23 | Discharge: 2023-11-23 | Disposition: A | Discharge status: Nursing Home (Includes ICF) | Attending: Emergency Medicine | Admitting: Emergency Medicine

## 2023-11-23 ENCOUNTER — Encounter: Payer: Self-pay | Admitting: Emergency Medicine

## 2023-11-23 DIAGNOSIS — I10 Essential (primary) hypertension: Secondary | ICD-10-CM

## 2023-11-23 DIAGNOSIS — J449 Chronic obstructive pulmonary disease, unspecified: Secondary | ICD-10-CM | POA: Diagnosis not present

## 2023-11-23 DIAGNOSIS — H04123 Dry eye syndrome of bilateral lacrimal glands: Secondary | ICD-10-CM | POA: Diagnosis not present

## 2023-11-23 DIAGNOSIS — K5900 Constipation, unspecified: Secondary | ICD-10-CM | POA: Diagnosis not present

## 2023-11-23 DIAGNOSIS — E46 Unspecified protein-calorie malnutrition: Secondary | ICD-10-CM | POA: Diagnosis not present

## 2023-11-23 DIAGNOSIS — I739 Peripheral vascular disease, unspecified: Secondary | ICD-10-CM | POA: Diagnosis not present

## 2023-11-23 DIAGNOSIS — N400 Enlarged prostate without lower urinary tract symptoms: Secondary | ICD-10-CM | POA: Diagnosis not present

## 2023-11-23 DIAGNOSIS — R000 Tachycardia, unspecified: Secondary | ICD-10-CM | POA: Diagnosis not present

## 2023-11-23 DIAGNOSIS — I129 Hypertensive chronic kidney disease with stage 1 through stage 4 chronic kidney disease, or unspecified chronic kidney disease: Secondary | ICD-10-CM | POA: Diagnosis not present

## 2023-11-23 DIAGNOSIS — R630 Anorexia: Secondary | ICD-10-CM | POA: Diagnosis not present

## 2023-11-23 DIAGNOSIS — N189 Chronic kidney disease, unspecified: Secondary | ICD-10-CM | POA: Insufficient documentation

## 2023-11-23 DIAGNOSIS — Z5181 Encounter for therapeutic drug level monitoring: Secondary | ICD-10-CM | POA: Diagnosis not present

## 2023-11-23 DIAGNOSIS — Z79899 Other long term (current) drug therapy: Secondary | ICD-10-CM | POA: Diagnosis not present

## 2023-11-23 DIAGNOSIS — Z85038 Personal history of other malignant neoplasm of large intestine: Secondary | ICD-10-CM | POA: Insufficient documentation

## 2023-11-23 DIAGNOSIS — M549 Dorsalgia, unspecified: Secondary | ICD-10-CM | POA: Diagnosis not present

## 2023-11-23 DIAGNOSIS — Z743 Need for continuous supervision: Secondary | ICD-10-CM | POA: Diagnosis not present

## 2023-11-23 DIAGNOSIS — I6330 Cerebral infarction due to thrombosis of unspecified cerebral artery: Secondary | ICD-10-CM | POA: Diagnosis not present

## 2023-11-23 DIAGNOSIS — E559 Vitamin D deficiency, unspecified: Secondary | ICD-10-CM | POA: Diagnosis not present

## 2023-11-23 DIAGNOSIS — N289 Disorder of kidney and ureter, unspecified: Secondary | ICD-10-CM | POA: Diagnosis not present

## 2023-11-23 DIAGNOSIS — J439 Emphysema, unspecified: Secondary | ICD-10-CM | POA: Diagnosis not present

## 2023-11-23 DIAGNOSIS — T887XXA Unspecified adverse effect of drug or medicament, initial encounter: Secondary | ICD-10-CM | POA: Diagnosis not present

## 2023-11-23 LAB — CBC WITH DIFFERENTIAL/PLATELET
Abs Immature Granulocytes: 0.03 10*3/uL (ref 0.00–0.07)
Basophils Absolute: 0 10*3/uL (ref 0.0–0.1)
Basophils Relative: 0 %
Eosinophils Absolute: 0.1 10*3/uL (ref 0.0–0.5)
Eosinophils Relative: 1 %
HCT: 35.2 % — ABNORMAL LOW (ref 39.0–52.0)
Hemoglobin: 11.8 g/dL — ABNORMAL LOW (ref 13.0–17.0)
Immature Granulocytes: 0 %
Lymphocytes Relative: 11 %
Lymphs Abs: 1.2 10*3/uL (ref 0.7–4.0)
MCH: 32.7 pg (ref 26.0–34.0)
MCHC: 33.5 g/dL (ref 30.0–36.0)
MCV: 97.5 fL (ref 80.0–100.0)
Monocytes Absolute: 0.7 10*3/uL (ref 0.1–1.0)
Monocytes Relative: 6 %
Neutro Abs: 8.7 10*3/uL — ABNORMAL HIGH (ref 1.7–7.7)
Neutrophils Relative %: 82 %
Platelets: 263 10*3/uL (ref 150–400)
RBC: 3.61 MIL/uL — ABNORMAL LOW (ref 4.22–5.81)
RDW: 14.7 % (ref 11.5–15.5)
WBC: 10.8 10*3/uL — ABNORMAL HIGH (ref 4.0–10.5)
nRBC: 0 % (ref 0.0–0.2)

## 2023-11-23 LAB — HEPATIC FUNCTION PANEL
ALT: 11 U/L (ref 0–44)
AST: 18 U/L (ref 15–41)
Albumin: 3.6 g/dL (ref 3.5–5.0)
Alkaline Phosphatase: 91 U/L (ref 38–126)
Bilirubin, Direct: 0.1 mg/dL (ref 0.0–0.2)
Indirect Bilirubin: 0.8 mg/dL (ref 0.3–0.9)
Total Bilirubin: 0.9 mg/dL (ref 0.0–1.2)
Total Protein: 7.8 g/dL (ref 6.5–8.1)

## 2023-11-23 LAB — BASIC METABOLIC PANEL
Anion gap: 14 (ref 5–15)
BUN: 26 mg/dL — ABNORMAL HIGH (ref 6–20)
CO2: 24 mmol/L (ref 22–32)
Calcium: 9.8 mg/dL (ref 8.9–10.3)
Chloride: 101 mmol/L (ref 98–111)
Creatinine, Ser: 1.35 mg/dL — ABNORMAL HIGH (ref 0.61–1.24)
GFR, Estimated: >60 mL/min (ref >60)
Glucose, Bld: 93 mg/dL (ref 70–99)
Potassium: 4.1 mmol/L (ref 3.5–5.1)
Sodium: 139 mmol/L (ref 135–145)

## 2023-11-23 LAB — LIPASE, BLOOD: Lipase: 24 U/L (ref 11–51)

## 2023-11-23 NOTE — ED Notes (Signed)
 This tech attempted blood draw x2. One tube of blood drawn. There was a lab tech in the triage area. This tech asked the lab tech if she could draw the second tube of blood. Lab tech said "I have to get upstairs". Marylene Land, RN, made aware and she called lab.

## 2023-11-23 NOTE — ED Notes (Signed)
 See triage note  Presents from [facility] with elevated b/p  The staff noticed that is was elevated  Pt denies any complaints

## 2023-11-23 NOTE — ED Triage Notes (Signed)
 Patient to ED via [facility] from [facility] for hypertension. Pt denies any complaints at this time. NAD noted.

## 2023-11-23 NOTE — ED Provider Notes (Signed)
 [facility] Provider Note    Event Date/Time   First MD Initiated Contact with Patient 11/23/23 1732     (approximate)   History   Chief Complaint Hypertension   HPI  Tell Donald [name]. is a 63 y.o. male with past medical history of hypertension, PAD, COPD, stroke, CKD, colon cancer, alcohol abuse who presents to the ED complaining hypertension.  Patient reports that he was sent to the ED from his nursing facility due to elevated blood pressure.  He states that he has been feeling overall well lately, complains only of some decreased appetite.  He denies any headache, vision changes, speech changes, numbness, or weakness.  He has not had any chest pain or difficulty breathing, states he has been making a normal amount of urine.  He has been taking all his medications as prescribed.     Physical Exam   Triage Vital Signs: ED Triage Vitals [11/23/23 1533]  Encounter Vitals Group     BP (!) 189/110     Systolic BP Percentile      Diastolic BP Percentile      Pulse Rate (!) 114     Resp 18     Temp 97.8 F (36.6 C)     Temp Source Oral     SpO2 98 %     Weight 105 lb 13.1 oz (48 kg)     Height 5\' 8"  (1.727 m)     Head Circumference      Peak Flow      Pain Score 0     Pain Loc      Pain Education      Exclude from Growth Chart     Most recent vital signs: Vitals:   11/23/23 1810 11/23/23 1900  BP: (!) 210/110 (!) 191/100  Pulse: (!) 110 100  Resp: 18 20  Temp:    SpO2:  99%    Constitutional: Alert and oriented. Eyes: Conjunctivae are normal. Head: Atraumatic. Nose: No congestion/rhinnorhea. Mouth/Throat: Mucous membranes are moist.  Cardiovascular: Normal rate, regular rhythm. Grossly normal heart sounds.  2+ radial pulses bilaterally. Respiratory: Normal respiratory effort.  No retractions. Lungs CTAB. Gastrointestinal: Soft and nontender. No distention. Musculoskeletal: No lower extremity tenderness nor edema.  Neurologic:  Normal  speech and language. No gross focal neurologic deficits are appreciated.    ED Results / Procedures / Treatments   Labs (all labs ordered are listed, but only abnormal results are displayed) Labs Reviewed  BASIC METABOLIC PANEL - Abnormal; Notable for the following components:      Result Value   BUN 26 (*)    Creatinine, Ser 1.35 (*)    All other components within normal limits  CBC WITH DIFFERENTIAL/PLATELET - Abnormal; Notable for the following components:   WBC 10.8 (*)    RBC 3.61 (*)    Hemoglobin 11.8 (*)    HCT 35.2 (*)    Neutro Abs 8.7 (*)    All other components within normal limits  HEPATIC FUNCTION PANEL  LIPASE, BLOOD  CBC WITH DIFFERENTIAL/PLATELET     EKG  ED ECG REPORT I, Chesley Noon, the attending physician, personally viewed and interpreted this ECG.   Date: 11/23/2023  EKG Time: 18:19  Rate: 102  Rhythm: sinus tachycardia  Axis: Normal  Intervals:none  ST&T Change: None  PROCEDURES:  Critical Care performed: No  Procedures   MEDICATIONS ORDERED IN ED: Medications - No data to display   IMPRESSION / MDM / ASSESSMENT  AND PLAN / ED COURSE  I reviewed the triage vital signs and the nursing notes.                              63 y.o. male with past medical history of hypertension, PAD, stroke, CKD, COPD, colon cancer, and alcohol abuse who presents to the ED complaining of elevated blood pressure readings at his nursing facility with some decreased appetite.  Patient's presentation is most consistent with acute presentation with potential threat to life or bodily function.  Differential diagnosis includes, but is not limited to, hypertension, hypertensive emergency, stroke, ACS, AKI, electrolyte abnormality, medication noncompliance.  Patient nontoxic-appearing and in no acute distress, vital signs are remarkable for hypertension but otherwise reassuring.  Patient complains only of decreased appetite, has a benign abdominal exam and a  nonfocal neurologic exam.  EKG shows no evidence of arrhythmia or ischemia, labs show renal function stable compared to previous without acute electrolyte abnormality and no significant anemia or leukocytosis.  LFTs and lipase are unremarkable, no evidence of hypertensive emergency at this time.  Patient is appropriate for discharge home with outpatient PCP follow-up for recheck of his blood pressure.  He was counseled to return to the ED for new or worsening symptoms, patient agrees with plan.      FINAL CLINICAL IMPRESSION(S) / ED DIAGNOSES   Final diagnoses:  Uncontrolled hypertension  Decreased appetite     Rx / DC Orders   ED Discharge Orders     None        Note:  This document was prepared using Dragon voice recognition software and may include unintentional dictation errors.   Chesley Noon, MD 11/23/23 [phone]

## 2023-11-24 DIAGNOSIS — I6330 Cerebral infarction due to thrombosis of unspecified cerebral artery: Secondary | ICD-10-CM | POA: Diagnosis not present

## 2023-11-25 DIAGNOSIS — I6330 Cerebral infarction due to thrombosis of unspecified cerebral artery: Secondary | ICD-10-CM | POA: Diagnosis not present

## 2023-11-26 DIAGNOSIS — I6330 Cerebral infarction due to thrombosis of unspecified cerebral artery: Secondary | ICD-10-CM | POA: Diagnosis not present

## 2023-11-27 DIAGNOSIS — I6330 Cerebral infarction due to thrombosis of unspecified cerebral artery: Secondary | ICD-10-CM | POA: Diagnosis not present

## 2023-11-28 DIAGNOSIS — I6330 Cerebral infarction due to thrombosis of unspecified cerebral artery: Secondary | ICD-10-CM | POA: Diagnosis not present

## 2023-11-29 DIAGNOSIS — I6330 Cerebral infarction due to thrombosis of unspecified cerebral artery: Secondary | ICD-10-CM | POA: Diagnosis not present

## 2023-11-30 DIAGNOSIS — G629 Polyneuropathy, unspecified: Secondary | ICD-10-CM | POA: Diagnosis not present

## 2023-11-30 DIAGNOSIS — I739 Peripheral vascular disease, unspecified: Secondary | ICD-10-CM | POA: Diagnosis not present

## 2023-11-30 DIAGNOSIS — I6330 Cerebral infarction due to thrombosis of unspecified cerebral artery: Secondary | ICD-10-CM | POA: Diagnosis not present

## 2023-11-30 DIAGNOSIS — H04123 Dry eye syndrome of bilateral lacrimal glands: Secondary | ICD-10-CM | POA: Diagnosis not present

## 2023-11-30 DIAGNOSIS — E785 Hyperlipidemia, unspecified: Secondary | ICD-10-CM | POA: Diagnosis not present

## 2023-11-30 DIAGNOSIS — G8929 Other chronic pain: Secondary | ICD-10-CM | POA: Diagnosis not present

## 2023-11-30 DIAGNOSIS — N189 Chronic kidney disease, unspecified: Secondary | ICD-10-CM | POA: Diagnosis not present

## 2023-11-30 DIAGNOSIS — M549 Dorsalgia, unspecified: Secondary | ICD-10-CM | POA: Diagnosis not present

## 2023-11-30 DIAGNOSIS — N289 Disorder of kidney and ureter, unspecified: Secondary | ICD-10-CM | POA: Diagnosis not present

## 2023-11-30 DIAGNOSIS — I10 Essential (primary) hypertension: Secondary | ICD-10-CM | POA: Diagnosis not present

## 2023-11-30 DIAGNOSIS — S7292XD Unspecified fracture of left femur, subsequent encounter for closed fracture with routine healing: Secondary | ICD-10-CM | POA: Diagnosis not present

## 2023-11-30 DIAGNOSIS — J449 Chronic obstructive pulmonary disease, unspecified: Secondary | ICD-10-CM | POA: Diagnosis not present

## 2023-12-01 DIAGNOSIS — I6330 Cerebral infarction due to thrombosis of unspecified cerebral artery: Secondary | ICD-10-CM | POA: Diagnosis not present

## 2023-12-02 DIAGNOSIS — F5105 Insomnia due to other mental disorder: Secondary | ICD-10-CM | POA: Diagnosis not present

## 2023-12-02 DIAGNOSIS — I6330 Cerebral infarction due to thrombosis of unspecified cerebral artery: Secondary | ICD-10-CM | POA: Diagnosis not present

## 2023-12-02 DIAGNOSIS — F419 Anxiety disorder, unspecified: Secondary | ICD-10-CM | POA: Diagnosis not present

## 2023-12-03 DIAGNOSIS — I6330 Cerebral infarction due to thrombosis of unspecified cerebral artery: Secondary | ICD-10-CM | POA: Diagnosis not present

## 2023-12-04 DIAGNOSIS — I6330 Cerebral infarction due to thrombosis of unspecified cerebral artery: Secondary | ICD-10-CM | POA: Diagnosis not present

## 2023-12-05 DIAGNOSIS — I6330 Cerebral infarction due to thrombosis of unspecified cerebral artery: Secondary | ICD-10-CM | POA: Diagnosis not present

## 2023-12-06 DIAGNOSIS — I6330 Cerebral infarction due to thrombosis of unspecified cerebral artery: Secondary | ICD-10-CM | POA: Diagnosis not present

## 2023-12-07 DIAGNOSIS — J449 Chronic obstructive pulmonary disease, unspecified: Secondary | ICD-10-CM | POA: Diagnosis not present

## 2023-12-07 DIAGNOSIS — N189 Chronic kidney disease, unspecified: Secondary | ICD-10-CM | POA: Diagnosis not present

## 2023-12-07 DIAGNOSIS — G629 Polyneuropathy, unspecified: Secondary | ICD-10-CM | POA: Diagnosis not present

## 2023-12-07 DIAGNOSIS — I739 Peripheral vascular disease, unspecified: Secondary | ICD-10-CM | POA: Diagnosis not present

## 2023-12-07 DIAGNOSIS — K5900 Constipation, unspecified: Secondary | ICD-10-CM | POA: Diagnosis not present

## 2023-12-07 DIAGNOSIS — N289 Disorder of kidney and ureter, unspecified: Secondary | ICD-10-CM | POA: Diagnosis not present

## 2023-12-07 DIAGNOSIS — M549 Dorsalgia, unspecified: Secondary | ICD-10-CM | POA: Diagnosis not present

## 2023-12-07 DIAGNOSIS — H04123 Dry eye syndrome of bilateral lacrimal glands: Secondary | ICD-10-CM | POA: Diagnosis not present

## 2023-12-07 DIAGNOSIS — I10 Essential (primary) hypertension: Secondary | ICD-10-CM | POA: Diagnosis not present

## 2023-12-07 DIAGNOSIS — S7292XD Unspecified fracture of left femur, subsequent encounter for closed fracture with routine healing: Secondary | ICD-10-CM | POA: Diagnosis not present

## 2023-12-07 DIAGNOSIS — E785 Hyperlipidemia, unspecified: Secondary | ICD-10-CM | POA: Diagnosis not present

## 2023-12-07 DIAGNOSIS — I6330 Cerebral infarction due to thrombosis of unspecified cerebral artery: Secondary | ICD-10-CM | POA: Diagnosis not present

## 2023-12-08 DIAGNOSIS — I6330 Cerebral infarction due to thrombosis of unspecified cerebral artery: Secondary | ICD-10-CM | POA: Diagnosis not present

## 2023-12-15 ENCOUNTER — Encounter: Payer: Self-pay | Admitting: Urology

## 2023-12-16 DIAGNOSIS — I6330 Cerebral infarction due to thrombosis of unspecified cerebral artery: Secondary | ICD-10-CM | POA: Diagnosis not present

## 2023-12-17 DIAGNOSIS — I6330 Cerebral infarction due to thrombosis of unspecified cerebral artery: Secondary | ICD-10-CM | POA: Diagnosis not present

## 2023-12-18 DIAGNOSIS — I6330 Cerebral infarction due to thrombosis of unspecified cerebral artery: Secondary | ICD-10-CM | POA: Diagnosis not present

## 2023-12-18 DIAGNOSIS — Z419 Encounter for procedure for purposes other than remedying health state, unspecified: Secondary | ICD-10-CM | POA: Diagnosis not present

## 2023-12-19 DIAGNOSIS — I6330 Cerebral infarction due to thrombosis of unspecified cerebral artery: Secondary | ICD-10-CM | POA: Diagnosis not present

## 2023-12-20 DIAGNOSIS — Z8781 Personal history of (healed) traumatic fracture: Secondary | ICD-10-CM | POA: Diagnosis not present

## 2023-12-20 DIAGNOSIS — S22000A Wedge compression fracture of unspecified thoracic vertebra, initial encounter for closed fracture: Secondary | ICD-10-CM | POA: Diagnosis not present

## 2023-12-20 DIAGNOSIS — I6330 Cerebral infarction due to thrombosis of unspecified cerebral artery: Secondary | ICD-10-CM | POA: Diagnosis not present

## 2023-12-20 DIAGNOSIS — Z9889 Other specified postprocedural states: Secondary | ICD-10-CM | POA: Diagnosis not present

## 2023-12-21 DIAGNOSIS — I6330 Cerebral infarction due to thrombosis of unspecified cerebral artery: Secondary | ICD-10-CM | POA: Diagnosis not present

## 2023-12-21 DIAGNOSIS — F4322 Adjustment disorder with anxiety: Secondary | ICD-10-CM | POA: Diagnosis not present

## 2023-12-21 DIAGNOSIS — F5105 Insomnia due to other mental disorder: Secondary | ICD-10-CM | POA: Diagnosis not present

## 2023-12-21 DIAGNOSIS — F419 Anxiety disorder, unspecified: Secondary | ICD-10-CM | POA: Diagnosis not present

## 2023-12-22 DIAGNOSIS — I6330 Cerebral infarction due to thrombosis of unspecified cerebral artery: Secondary | ICD-10-CM | POA: Diagnosis not present

## 2023-12-23 DIAGNOSIS — I6330 Cerebral infarction due to thrombosis of unspecified cerebral artery: Secondary | ICD-10-CM | POA: Diagnosis not present

## 2023-12-24 DIAGNOSIS — I6330 Cerebral infarction due to thrombosis of unspecified cerebral artery: Secondary | ICD-10-CM | POA: Diagnosis not present

## 2023-12-25 DIAGNOSIS — I6330 Cerebral infarction due to thrombosis of unspecified cerebral artery: Secondary | ICD-10-CM | POA: Diagnosis not present

## 2023-12-26 DIAGNOSIS — I6330 Cerebral infarction due to thrombosis of unspecified cerebral artery: Secondary | ICD-10-CM | POA: Diagnosis not present

## 2023-12-27 DIAGNOSIS — Z79899 Other long term (current) drug therapy: Secondary | ICD-10-CM | POA: Diagnosis not present

## 2023-12-27 DIAGNOSIS — R7309 Other abnormal glucose: Secondary | ICD-10-CM | POA: Diagnosis not present

## 2023-12-27 DIAGNOSIS — F419 Anxiety disorder, unspecified: Secondary | ICD-10-CM | POA: Diagnosis not present

## 2023-12-27 DIAGNOSIS — D519 Vitamin B12 deficiency anemia, unspecified: Secondary | ICD-10-CM | POA: Diagnosis not present

## 2023-12-27 DIAGNOSIS — E038 Other specified hypothyroidism: Secondary | ICD-10-CM | POA: Diagnosis not present

## 2023-12-27 DIAGNOSIS — I6330 Cerebral infarction due to thrombosis of unspecified cerebral artery: Secondary | ICD-10-CM | POA: Diagnosis not present

## 2023-12-27 DIAGNOSIS — F5105 Insomnia due to other mental disorder: Secondary | ICD-10-CM | POA: Diagnosis not present

## 2023-12-27 DIAGNOSIS — E782 Mixed hyperlipidemia: Secondary | ICD-10-CM | POA: Diagnosis not present

## 2023-12-28 DIAGNOSIS — I6330 Cerebral infarction due to thrombosis of unspecified cerebral artery: Secondary | ICD-10-CM | POA: Diagnosis not present

## 2023-12-29 DIAGNOSIS — I6330 Cerebral infarction due to thrombosis of unspecified cerebral artery: Secondary | ICD-10-CM | POA: Diagnosis not present

## 2024-01-04 DIAGNOSIS — N400 Enlarged prostate without lower urinary tract symptoms: Secondary | ICD-10-CM | POA: Diagnosis not present

## 2024-01-04 DIAGNOSIS — J449 Chronic obstructive pulmonary disease, unspecified: Secondary | ICD-10-CM | POA: Diagnosis not present

## 2024-01-04 DIAGNOSIS — I739 Peripheral vascular disease, unspecified: Secondary | ICD-10-CM | POA: Diagnosis not present

## 2024-01-04 DIAGNOSIS — H04123 Dry eye syndrome of bilateral lacrimal glands: Secondary | ICD-10-CM | POA: Diagnosis not present

## 2024-01-04 DIAGNOSIS — E46 Unspecified protein-calorie malnutrition: Secondary | ICD-10-CM | POA: Diagnosis not present

## 2024-01-04 DIAGNOSIS — J439 Emphysema, unspecified: Secondary | ICD-10-CM | POA: Diagnosis not present

## 2024-01-04 DIAGNOSIS — S7292XD Unspecified fracture of left femur, subsequent encounter for closed fracture with routine healing: Secondary | ICD-10-CM | POA: Diagnosis not present

## 2024-01-04 DIAGNOSIS — K5900 Constipation, unspecified: Secondary | ICD-10-CM | POA: Diagnosis not present

## 2024-01-04 DIAGNOSIS — M549 Dorsalgia, unspecified: Secondary | ICD-10-CM | POA: Diagnosis not present

## 2024-01-04 DIAGNOSIS — G8929 Other chronic pain: Secondary | ICD-10-CM | POA: Diagnosis not present

## 2024-01-04 DIAGNOSIS — I10 Essential (primary) hypertension: Secondary | ICD-10-CM | POA: Diagnosis not present

## 2024-01-06 DIAGNOSIS — I6330 Cerebral infarction due to thrombosis of unspecified cerebral artery: Secondary | ICD-10-CM | POA: Diagnosis not present

## 2024-01-07 DIAGNOSIS — I6330 Cerebral infarction due to thrombosis of unspecified cerebral artery: Secondary | ICD-10-CM | POA: Diagnosis not present

## 2024-01-08 DIAGNOSIS — I6330 Cerebral infarction due to thrombosis of unspecified cerebral artery: Secondary | ICD-10-CM | POA: Diagnosis not present

## 2024-01-09 DIAGNOSIS — I6330 Cerebral infarction due to thrombosis of unspecified cerebral artery: Secondary | ICD-10-CM | POA: Diagnosis not present

## 2024-01-10 DIAGNOSIS — I6330 Cerebral infarction due to thrombosis of unspecified cerebral artery: Secondary | ICD-10-CM | POA: Diagnosis not present

## 2024-01-11 DIAGNOSIS — I6330 Cerebral infarction due to thrombosis of unspecified cerebral artery: Secondary | ICD-10-CM | POA: Diagnosis not present

## 2024-01-12 DIAGNOSIS — I6330 Cerebral infarction due to thrombosis of unspecified cerebral artery: Secondary | ICD-10-CM | POA: Diagnosis not present

## 2024-01-12 DIAGNOSIS — Z5181 Encounter for therapeutic drug level monitoring: Secondary | ICD-10-CM | POA: Diagnosis not present

## 2024-01-13 DIAGNOSIS — I6330 Cerebral infarction due to thrombosis of unspecified cerebral artery: Secondary | ICD-10-CM | POA: Diagnosis not present

## 2024-01-14 DIAGNOSIS — I6330 Cerebral infarction due to thrombosis of unspecified cerebral artery: Secondary | ICD-10-CM | POA: Diagnosis not present

## 2024-01-15 DIAGNOSIS — I6330 Cerebral infarction due to thrombosis of unspecified cerebral artery: Secondary | ICD-10-CM | POA: Diagnosis not present

## 2024-01-16 DIAGNOSIS — I6330 Cerebral infarction due to thrombosis of unspecified cerebral artery: Secondary | ICD-10-CM | POA: Diagnosis not present

## 2024-01-17 DIAGNOSIS — I6330 Cerebral infarction due to thrombosis of unspecified cerebral artery: Secondary | ICD-10-CM | POA: Diagnosis not present

## 2024-01-17 DIAGNOSIS — Z419 Encounter for procedure for purposes other than remedying health state, unspecified: Secondary | ICD-10-CM | POA: Diagnosis not present

## 2024-01-18 DIAGNOSIS — I6330 Cerebral infarction due to thrombosis of unspecified cerebral artery: Secondary | ICD-10-CM | POA: Diagnosis not present

## 2024-01-19 ENCOUNTER — Ambulatory Visit: Payer: Self-pay | Admitting: Urology

## 2024-01-19 VITALS — BP 101/67 | HR 105 | Ht 68.0 in | Wt 102.5 lb

## 2024-01-19 DIAGNOSIS — N138 Other obstructive and reflux uropathy: Secondary | ICD-10-CM | POA: Diagnosis not present

## 2024-01-19 DIAGNOSIS — N401 Enlarged prostate with lower urinary tract symptoms: Secondary | ICD-10-CM

## 2024-01-19 DIAGNOSIS — N2889 Other specified disorders of kidney and ureter: Secondary | ICD-10-CM

## 2024-01-19 DIAGNOSIS — I6330 Cerebral infarction due to thrombosis of unspecified cerebral artery: Secondary | ICD-10-CM | POA: Diagnosis not present

## 2024-01-19 LAB — URINALYSIS, COMPLETE
Bilirubin, UA: NEGATIVE
Glucose, UA: NEGATIVE
Ketones, UA: NEGATIVE
Leukocytes,UA: NEGATIVE
Nitrite, UA: NEGATIVE
Protein,UA: NEGATIVE
RBC, UA: NEGATIVE
Specific Gravity, UA: 1.02 (ref 1.005–1.030)
Urobilinogen, Ur: 0.2 mg/dL (ref 0.2–1.0)
pH, UA: 6.5 (ref 5.0–7.5)

## 2024-01-19 LAB — MICROSCOPIC EXAMINATION: Bacteria, UA: NONE SEEN

## 2024-01-19 NOTE — Progress Notes (Signed)
 01/19/24  Chief Complaint  Patient presents with   Cysto   TRUS     HPI: 63 y.o. year-old male with refractory urinary symptoms related to BPH who presents today to the office for cystoscopy and prostate sizing.  See previous notes for details.  His only complaint is urinary hesitancy although mentions today that does not bother him at all.  It seems to bother his staff more than himself.    Please see previous notes for details.     Blood pressure 101/67, pulse (!) 105, height 5\' 8"  (1.727 m), weight 102 lb 8 oz (46.5 kg). NED. A&Ox3.   No respiratory distress   Abd soft, NT, ND Normal phallus with bilateral descended testicles    Cystoscopy Procedure Note  Patient identification was confirmed, informed consent was obtained, and patient was prepped using Betadine  solution.  Lidocaine  jelly was administered per urethral meatus.    Preoperative abx where received prior to procedure.     Pre-Procedure: - Inspection reveals a normal caliber ureteral meatus.  Procedure: The flexible cystoscope was introduced without difficulty - No urethral strictures/lesions are present. - Normal prostate  - Normal bladder neck - Bilateral ureteral orifices identified - Bladder mucosa  reveals no ulcers, tumors, or lesions - No bladder stones - No trabeculation  Retroflexion unremarkable   Post-Procedure: - Patient tolerated the procedure well   Prostate transrectal ultrasound sizing   Informed consent was obtained after discussing risks/benefits of the procedure.  A time out was performed to ensure correct patient identity.   Pre-Procedure: -Transrectal probe was placed without difficulty -Transrectal Ultrasound performed revealing a 11 gm prostate measuring 1.90 x 3.68 x 10.92 cm (length) -No significant hypoechoic or median lobe noted      Assessment/ Plan:  1.  Urinary hesitancy Overall, the patient declines any significant bother  Based on his relatively small  prostate size and normal bladder, there is likely component of pelvic floor dysfunction.  Declined referral to PT today but could consider this in the future if his symptoms worsen.  Continue Flomax .  No role for finasteride  based on his very small prostate size, will stop this medication.  Follow-up as needed - Urinalysis, Complete    Dustin Gimenez, MD

## 2024-01-20 DIAGNOSIS — I6330 Cerebral infarction due to thrombosis of unspecified cerebral artery: Secondary | ICD-10-CM | POA: Diagnosis not present

## 2024-01-21 DIAGNOSIS — I6330 Cerebral infarction due to thrombosis of unspecified cerebral artery: Secondary | ICD-10-CM | POA: Diagnosis not present

## 2024-01-22 DIAGNOSIS — I6330 Cerebral infarction due to thrombosis of unspecified cerebral artery: Secondary | ICD-10-CM | POA: Diagnosis not present

## 2024-01-23 DIAGNOSIS — I6330 Cerebral infarction due to thrombosis of unspecified cerebral artery: Secondary | ICD-10-CM | POA: Diagnosis not present

## 2024-01-24 DIAGNOSIS — I6330 Cerebral infarction due to thrombosis of unspecified cerebral artery: Secondary | ICD-10-CM | POA: Diagnosis not present

## 2024-01-25 DIAGNOSIS — I6330 Cerebral infarction due to thrombosis of unspecified cerebral artery: Secondary | ICD-10-CM | POA: Diagnosis not present

## 2024-01-26 DIAGNOSIS — I6330 Cerebral infarction due to thrombosis of unspecified cerebral artery: Secondary | ICD-10-CM | POA: Diagnosis not present

## 2024-01-27 DIAGNOSIS — I6330 Cerebral infarction due to thrombosis of unspecified cerebral artery: Secondary | ICD-10-CM | POA: Diagnosis not present

## 2024-01-28 DIAGNOSIS — I6330 Cerebral infarction due to thrombosis of unspecified cerebral artery: Secondary | ICD-10-CM | POA: Diagnosis not present

## 2024-01-29 DIAGNOSIS — I6330 Cerebral infarction due to thrombosis of unspecified cerebral artery: Secondary | ICD-10-CM | POA: Diagnosis not present

## 2024-01-30 DIAGNOSIS — I6330 Cerebral infarction due to thrombosis of unspecified cerebral artery: Secondary | ICD-10-CM | POA: Diagnosis not present

## 2024-01-31 DIAGNOSIS — I6330 Cerebral infarction due to thrombosis of unspecified cerebral artery: Secondary | ICD-10-CM | POA: Diagnosis not present

## 2024-02-01 DIAGNOSIS — E559 Vitamin D deficiency, unspecified: Secondary | ICD-10-CM | POA: Diagnosis not present

## 2024-02-01 DIAGNOSIS — L219 Seborrheic dermatitis, unspecified: Secondary | ICD-10-CM | POA: Diagnosis not present

## 2024-02-01 DIAGNOSIS — K5900 Constipation, unspecified: Secondary | ICD-10-CM | POA: Diagnosis not present

## 2024-02-01 DIAGNOSIS — N400 Enlarged prostate without lower urinary tract symptoms: Secondary | ICD-10-CM | POA: Diagnosis not present

## 2024-02-01 DIAGNOSIS — I6330 Cerebral infarction due to thrombosis of unspecified cerebral artery: Secondary | ICD-10-CM | POA: Diagnosis not present

## 2024-02-01 DIAGNOSIS — I739 Peripheral vascular disease, unspecified: Secondary | ICD-10-CM | POA: Diagnosis not present

## 2024-02-01 DIAGNOSIS — H04123 Dry eye syndrome of bilateral lacrimal glands: Secondary | ICD-10-CM | POA: Diagnosis not present

## 2024-02-01 DIAGNOSIS — M549 Dorsalgia, unspecified: Secondary | ICD-10-CM | POA: Diagnosis not present

## 2024-02-01 DIAGNOSIS — J439 Emphysema, unspecified: Secondary | ICD-10-CM | POA: Diagnosis not present

## 2024-02-01 DIAGNOSIS — I10 Essential (primary) hypertension: Secondary | ICD-10-CM | POA: Diagnosis not present

## 2024-02-01 DIAGNOSIS — R5381 Other malaise: Secondary | ICD-10-CM | POA: Diagnosis not present

## 2024-02-02 DIAGNOSIS — I6330 Cerebral infarction due to thrombosis of unspecified cerebral artery: Secondary | ICD-10-CM | POA: Diagnosis not present

## 2024-02-02 DIAGNOSIS — Z79899 Other long term (current) drug therapy: Secondary | ICD-10-CM | POA: Diagnosis not present

## 2024-02-02 DIAGNOSIS — R7309 Other abnormal glucose: Secondary | ICD-10-CM | POA: Diagnosis not present

## 2024-02-02 DIAGNOSIS — D519 Vitamin B12 deficiency anemia, unspecified: Secondary | ICD-10-CM | POA: Diagnosis not present

## 2024-02-02 DIAGNOSIS — E782 Mixed hyperlipidemia: Secondary | ICD-10-CM | POA: Diagnosis not present

## 2024-02-03 DIAGNOSIS — I6330 Cerebral infarction due to thrombosis of unspecified cerebral artery: Secondary | ICD-10-CM | POA: Diagnosis not present

## 2024-02-04 DIAGNOSIS — I6330 Cerebral infarction due to thrombosis of unspecified cerebral artery: Secondary | ICD-10-CM | POA: Diagnosis not present

## 2024-02-05 DIAGNOSIS — I6330 Cerebral infarction due to thrombosis of unspecified cerebral artery: Secondary | ICD-10-CM | POA: Diagnosis not present

## 2024-02-06 DIAGNOSIS — I6330 Cerebral infarction due to thrombosis of unspecified cerebral artery: Secondary | ICD-10-CM | POA: Diagnosis not present

## 2024-02-07 DIAGNOSIS — R7309 Other abnormal glucose: Secondary | ICD-10-CM | POA: Diagnosis not present

## 2024-02-07 DIAGNOSIS — I6330 Cerebral infarction due to thrombosis of unspecified cerebral artery: Secondary | ICD-10-CM | POA: Diagnosis not present

## 2024-02-07 DIAGNOSIS — E782 Mixed hyperlipidemia: Secondary | ICD-10-CM | POA: Diagnosis not present

## 2024-02-07 DIAGNOSIS — D519 Vitamin B12 deficiency anemia, unspecified: Secondary | ICD-10-CM | POA: Diagnosis not present

## 2024-02-07 DIAGNOSIS — Z79899 Other long term (current) drug therapy: Secondary | ICD-10-CM | POA: Diagnosis not present

## 2024-02-08 DIAGNOSIS — I6330 Cerebral infarction due to thrombosis of unspecified cerebral artery: Secondary | ICD-10-CM | POA: Diagnosis not present

## 2024-02-09 DIAGNOSIS — I6330 Cerebral infarction due to thrombosis of unspecified cerebral artery: Secondary | ICD-10-CM | POA: Diagnosis not present

## 2024-02-10 DIAGNOSIS — I6330 Cerebral infarction due to thrombosis of unspecified cerebral artery: Secondary | ICD-10-CM | POA: Diagnosis not present

## 2024-02-11 DIAGNOSIS — I6330 Cerebral infarction due to thrombosis of unspecified cerebral artery: Secondary | ICD-10-CM | POA: Diagnosis not present

## 2024-02-12 DIAGNOSIS — I6330 Cerebral infarction due to thrombosis of unspecified cerebral artery: Secondary | ICD-10-CM | POA: Diagnosis not present

## 2024-02-13 DIAGNOSIS — I6330 Cerebral infarction due to thrombosis of unspecified cerebral artery: Secondary | ICD-10-CM | POA: Diagnosis not present

## 2024-02-14 DIAGNOSIS — I6330 Cerebral infarction due to thrombosis of unspecified cerebral artery: Secondary | ICD-10-CM | POA: Diagnosis not present

## 2024-02-15 DIAGNOSIS — I6330 Cerebral infarction due to thrombosis of unspecified cerebral artery: Secondary | ICD-10-CM | POA: Diagnosis not present

## 2024-02-16 DIAGNOSIS — I6330 Cerebral infarction due to thrombosis of unspecified cerebral artery: Secondary | ICD-10-CM | POA: Diagnosis not present

## 2024-02-17 DIAGNOSIS — I6330 Cerebral infarction due to thrombosis of unspecified cerebral artery: Secondary | ICD-10-CM | POA: Diagnosis not present

## 2024-02-17 DIAGNOSIS — Z419 Encounter for procedure for purposes other than remedying health state, unspecified: Secondary | ICD-10-CM | POA: Diagnosis not present

## 2024-02-18 DIAGNOSIS — I6330 Cerebral infarction due to thrombosis of unspecified cerebral artery: Secondary | ICD-10-CM | POA: Diagnosis not present

## 2024-02-19 DIAGNOSIS — I6330 Cerebral infarction due to thrombosis of unspecified cerebral artery: Secondary | ICD-10-CM | POA: Diagnosis not present

## 2024-02-20 DIAGNOSIS — I6330 Cerebral infarction due to thrombosis of unspecified cerebral artery: Secondary | ICD-10-CM | POA: Diagnosis not present

## 2024-02-21 DIAGNOSIS — I6330 Cerebral infarction due to thrombosis of unspecified cerebral artery: Secondary | ICD-10-CM | POA: Diagnosis not present

## 2024-02-22 DIAGNOSIS — I6330 Cerebral infarction due to thrombosis of unspecified cerebral artery: Secondary | ICD-10-CM | POA: Diagnosis not present

## 2024-02-23 DIAGNOSIS — I6330 Cerebral infarction due to thrombosis of unspecified cerebral artery: Secondary | ICD-10-CM | POA: Diagnosis not present

## 2024-02-24 DIAGNOSIS — I6330 Cerebral infarction due to thrombosis of unspecified cerebral artery: Secondary | ICD-10-CM | POA: Diagnosis not present

## 2024-02-25 DIAGNOSIS — I6330 Cerebral infarction due to thrombosis of unspecified cerebral artery: Secondary | ICD-10-CM | POA: Diagnosis not present

## 2024-02-26 DIAGNOSIS — I6330 Cerebral infarction due to thrombosis of unspecified cerebral artery: Secondary | ICD-10-CM | POA: Diagnosis not present

## 2024-02-27 DIAGNOSIS — I6330 Cerebral infarction due to thrombosis of unspecified cerebral artery: Secondary | ICD-10-CM | POA: Diagnosis not present

## 2024-02-28 DIAGNOSIS — I6330 Cerebral infarction due to thrombosis of unspecified cerebral artery: Secondary | ICD-10-CM | POA: Diagnosis not present

## 2024-02-29 DIAGNOSIS — I6330 Cerebral infarction due to thrombosis of unspecified cerebral artery: Secondary | ICD-10-CM | POA: Diagnosis not present

## 2024-02-29 DIAGNOSIS — I739 Peripheral vascular disease, unspecified: Secondary | ICD-10-CM | POA: Diagnosis not present

## 2024-02-29 DIAGNOSIS — G629 Polyneuropathy, unspecified: Secondary | ICD-10-CM | POA: Diagnosis not present

## 2024-02-29 DIAGNOSIS — J449 Chronic obstructive pulmonary disease, unspecified: Secondary | ICD-10-CM | POA: Diagnosis not present

## 2024-02-29 DIAGNOSIS — G8929 Other chronic pain: Secondary | ICD-10-CM | POA: Diagnosis not present

## 2024-02-29 DIAGNOSIS — E785 Hyperlipidemia, unspecified: Secondary | ICD-10-CM | POA: Diagnosis not present

## 2024-02-29 DIAGNOSIS — H04123 Dry eye syndrome of bilateral lacrimal glands: Secondary | ICD-10-CM | POA: Diagnosis not present

## 2024-02-29 DIAGNOSIS — N189 Chronic kidney disease, unspecified: Secondary | ICD-10-CM | POA: Diagnosis not present

## 2024-02-29 DIAGNOSIS — M549 Dorsalgia, unspecified: Secondary | ICD-10-CM | POA: Diagnosis not present

## 2024-02-29 DIAGNOSIS — J439 Emphysema, unspecified: Secondary | ICD-10-CM | POA: Diagnosis not present

## 2024-02-29 DIAGNOSIS — I10 Essential (primary) hypertension: Secondary | ICD-10-CM | POA: Diagnosis not present

## 2024-02-29 DIAGNOSIS — G4700 Insomnia, unspecified: Secondary | ICD-10-CM | POA: Diagnosis not present

## 2024-03-01 DIAGNOSIS — I6330 Cerebral infarction due to thrombosis of unspecified cerebral artery: Secondary | ICD-10-CM | POA: Diagnosis not present

## 2024-03-01 DIAGNOSIS — F17210 Nicotine dependence, cigarettes, uncomplicated: Secondary | ICD-10-CM | POA: Diagnosis not present

## 2024-03-01 DIAGNOSIS — J449 Chronic obstructive pulmonary disease, unspecified: Secondary | ICD-10-CM | POA: Diagnosis not present

## 2024-03-02 DIAGNOSIS — I6330 Cerebral infarction due to thrombosis of unspecified cerebral artery: Secondary | ICD-10-CM | POA: Diagnosis not present

## 2024-03-03 DIAGNOSIS — I6330 Cerebral infarction due to thrombosis of unspecified cerebral artery: Secondary | ICD-10-CM | POA: Diagnosis not present

## 2024-03-04 DIAGNOSIS — I6330 Cerebral infarction due to thrombosis of unspecified cerebral artery: Secondary | ICD-10-CM | POA: Diagnosis not present

## 2024-03-05 DIAGNOSIS — I6330 Cerebral infarction due to thrombosis of unspecified cerebral artery: Secondary | ICD-10-CM | POA: Diagnosis not present

## 2024-03-06 DIAGNOSIS — I6330 Cerebral infarction due to thrombosis of unspecified cerebral artery: Secondary | ICD-10-CM | POA: Diagnosis not present

## 2024-03-07 DIAGNOSIS — I6330 Cerebral infarction due to thrombosis of unspecified cerebral artery: Secondary | ICD-10-CM | POA: Diagnosis not present

## 2024-03-08 DIAGNOSIS — I6330 Cerebral infarction due to thrombosis of unspecified cerebral artery: Secondary | ICD-10-CM | POA: Diagnosis not present

## 2024-03-09 DIAGNOSIS — I6330 Cerebral infarction due to thrombosis of unspecified cerebral artery: Secondary | ICD-10-CM | POA: Diagnosis not present

## 2024-03-10 DIAGNOSIS — I6330 Cerebral infarction due to thrombosis of unspecified cerebral artery: Secondary | ICD-10-CM | POA: Diagnosis not present

## 2024-03-11 DIAGNOSIS — I6330 Cerebral infarction due to thrombosis of unspecified cerebral artery: Secondary | ICD-10-CM | POA: Diagnosis not present

## 2024-03-12 DIAGNOSIS — I6330 Cerebral infarction due to thrombosis of unspecified cerebral artery: Secondary | ICD-10-CM | POA: Diagnosis not present

## 2024-03-13 DIAGNOSIS — I6330 Cerebral infarction due to thrombosis of unspecified cerebral artery: Secondary | ICD-10-CM | POA: Diagnosis not present

## 2024-03-13 DIAGNOSIS — E038 Other specified hypothyroidism: Secondary | ICD-10-CM | POA: Diagnosis not present

## 2024-03-13 DIAGNOSIS — Z79899 Other long term (current) drug therapy: Secondary | ICD-10-CM | POA: Diagnosis not present

## 2024-03-13 DIAGNOSIS — E782 Mixed hyperlipidemia: Secondary | ICD-10-CM | POA: Diagnosis not present

## 2024-03-13 DIAGNOSIS — R7309 Other abnormal glucose: Secondary | ICD-10-CM | POA: Diagnosis not present

## 2024-03-13 DIAGNOSIS — D519 Vitamin B12 deficiency anemia, unspecified: Secondary | ICD-10-CM | POA: Diagnosis not present

## 2024-03-14 DIAGNOSIS — I6330 Cerebral infarction due to thrombosis of unspecified cerebral artery: Secondary | ICD-10-CM | POA: Diagnosis not present

## 2024-03-15 DIAGNOSIS — I6330 Cerebral infarction due to thrombosis of unspecified cerebral artery: Secondary | ICD-10-CM | POA: Diagnosis not present

## 2024-03-16 DIAGNOSIS — I6330 Cerebral infarction due to thrombosis of unspecified cerebral artery: Secondary | ICD-10-CM | POA: Diagnosis not present

## 2024-03-17 DIAGNOSIS — I6330 Cerebral infarction due to thrombosis of unspecified cerebral artery: Secondary | ICD-10-CM | POA: Diagnosis not present

## 2024-03-18 DIAGNOSIS — I6330 Cerebral infarction due to thrombosis of unspecified cerebral artery: Secondary | ICD-10-CM | POA: Diagnosis not present

## 2024-03-18 DIAGNOSIS — Z419 Encounter for procedure for purposes other than remedying health state, unspecified: Secondary | ICD-10-CM | POA: Diagnosis not present

## 2024-03-19 DIAGNOSIS — I6330 Cerebral infarction due to thrombosis of unspecified cerebral artery: Secondary | ICD-10-CM | POA: Diagnosis not present

## 2024-03-20 DIAGNOSIS — Z5181 Encounter for therapeutic drug level monitoring: Secondary | ICD-10-CM | POA: Diagnosis not present

## 2024-03-20 DIAGNOSIS — I6330 Cerebral infarction due to thrombosis of unspecified cerebral artery: Secondary | ICD-10-CM | POA: Diagnosis not present

## 2024-03-21 DIAGNOSIS — I6330 Cerebral infarction due to thrombosis of unspecified cerebral artery: Secondary | ICD-10-CM | POA: Diagnosis not present

## 2024-03-22 DIAGNOSIS — I6330 Cerebral infarction due to thrombosis of unspecified cerebral artery: Secondary | ICD-10-CM | POA: Diagnosis not present

## 2024-03-23 DIAGNOSIS — I6330 Cerebral infarction due to thrombosis of unspecified cerebral artery: Secondary | ICD-10-CM | POA: Diagnosis not present

## 2024-03-24 DIAGNOSIS — I6330 Cerebral infarction due to thrombosis of unspecified cerebral artery: Secondary | ICD-10-CM | POA: Diagnosis not present

## 2024-03-25 DIAGNOSIS — I6330 Cerebral infarction due to thrombosis of unspecified cerebral artery: Secondary | ICD-10-CM | POA: Diagnosis not present

## 2024-03-26 DIAGNOSIS — I6330 Cerebral infarction due to thrombosis of unspecified cerebral artery: Secondary | ICD-10-CM | POA: Diagnosis not present

## 2024-03-27 DIAGNOSIS — F5104 Psychophysiologic insomnia: Secondary | ICD-10-CM | POA: Diagnosis not present

## 2024-03-27 DIAGNOSIS — F419 Anxiety disorder, unspecified: Secondary | ICD-10-CM | POA: Diagnosis not present

## 2024-03-27 DIAGNOSIS — I6330 Cerebral infarction due to thrombosis of unspecified cerebral artery: Secondary | ICD-10-CM | POA: Diagnosis not present

## 2024-03-28 ENCOUNTER — Encounter: Payer: Self-pay | Admitting: Emergency Medicine

## 2024-03-28 ENCOUNTER — Emergency Department
Admission: EM | Admit: 2024-03-28 | Discharge: 2024-03-28 | Disposition: A | Discharge status: Skilled Nursing Facility | Source: Skilled Nursing Facility | Attending: Emergency Medicine | Admitting: Emergency Medicine

## 2024-03-28 ENCOUNTER — Other Ambulatory Visit: Payer: Self-pay

## 2024-03-28 DIAGNOSIS — J449 Chronic obstructive pulmonary disease, unspecified: Secondary | ICD-10-CM | POA: Insufficient documentation

## 2024-03-28 DIAGNOSIS — G8929 Other chronic pain: Secondary | ICD-10-CM | POA: Diagnosis not present

## 2024-03-28 DIAGNOSIS — E785 Hyperlipidemia, unspecified: Secondary | ICD-10-CM | POA: Diagnosis not present

## 2024-03-28 DIAGNOSIS — M549 Dorsalgia, unspecified: Secondary | ICD-10-CM | POA: Diagnosis not present

## 2024-03-28 DIAGNOSIS — H04123 Dry eye syndrome of bilateral lacrimal glands: Secondary | ICD-10-CM | POA: Diagnosis not present

## 2024-03-28 DIAGNOSIS — Z0283 Encounter for blood-alcohol and blood-drug test: Secondary | ICD-10-CM | POA: Insufficient documentation

## 2024-03-28 DIAGNOSIS — K5900 Constipation, unspecified: Secondary | ICD-10-CM | POA: Diagnosis not present

## 2024-03-28 DIAGNOSIS — I129 Hypertensive chronic kidney disease with stage 1 through stage 4 chronic kidney disease, or unspecified chronic kidney disease: Secondary | ICD-10-CM | POA: Insufficient documentation

## 2024-03-28 DIAGNOSIS — I10 Essential (primary) hypertension: Secondary | ICD-10-CM | POA: Diagnosis not present

## 2024-03-28 DIAGNOSIS — R4182 Altered mental status, unspecified: Secondary | ICD-10-CM | POA: Diagnosis not present

## 2024-03-28 DIAGNOSIS — G4700 Insomnia, unspecified: Secondary | ICD-10-CM | POA: Diagnosis not present

## 2024-03-28 DIAGNOSIS — N3000 Acute cystitis without hematuria: Secondary | ICD-10-CM | POA: Insufficient documentation

## 2024-03-28 DIAGNOSIS — I739 Peripheral vascular disease, unspecified: Secondary | ICD-10-CM | POA: Diagnosis not present

## 2024-03-28 DIAGNOSIS — N189 Chronic kidney disease, unspecified: Secondary | ICD-10-CM | POA: Diagnosis not present

## 2024-03-28 DIAGNOSIS — Z7901 Long term (current) use of anticoagulants: Secondary | ICD-10-CM | POA: Diagnosis not present

## 2024-03-28 DIAGNOSIS — I6330 Cerebral infarction due to thrombosis of unspecified cerebral artery: Secondary | ICD-10-CM | POA: Diagnosis not present

## 2024-03-28 DIAGNOSIS — Z743 Need for continuous supervision: Secondary | ICD-10-CM | POA: Diagnosis not present

## 2024-03-28 LAB — CBC WITH DIFFERENTIAL/PLATELET
Abs Immature Granulocytes: 0.05 K/uL (ref 0.00–0.07)
Basophils Absolute: 0.1 K/uL (ref 0.0–0.1)
Basophils Relative: 1 %
Eosinophils Absolute: 0.1 K/uL (ref 0.0–0.5)
Eosinophils Relative: 1 %
HCT: 43.8 % (ref 39.0–52.0)
Hemoglobin: 15 g/dL (ref 13.0–17.0)
Immature Granulocytes: 0 %
Lymphocytes Relative: 11 %
Lymphs Abs: 1.3 K/uL (ref 0.7–4.0)
MCH: 30.9 pg (ref 26.0–34.0)
MCHC: 34.2 g/dL (ref 30.0–36.0)
MCV: 90.1 fL (ref 80.0–100.0)
Monocytes Absolute: 0.7 K/uL (ref 0.1–1.0)
Monocytes Relative: 6 %
Neutro Abs: 10.3 K/uL — ABNORMAL HIGH (ref 1.7–7.7)
Neutrophils Relative %: 81 %
Platelets: 241 K/uL (ref 150–400)
RBC: 4.86 MIL/uL (ref 4.22–5.81)
RDW: 14.5 % (ref 11.5–15.5)
WBC: 12.6 K/uL — ABNORMAL HIGH (ref 4.0–10.5)
nRBC: 0 % (ref 0.0–0.2)

## 2024-03-28 LAB — BASIC METABOLIC PANEL WITH GFR
Anion gap: 13 (ref 5–15)
BUN: 16 mg/dL (ref 8–23)
CO2: 28 mmol/L (ref 22–32)
Calcium: 10 mg/dL (ref 8.9–10.3)
Chloride: 97 mmol/L — ABNORMAL LOW (ref 98–111)
Creatinine, Ser: 1.46 mg/dL — ABNORMAL HIGH (ref 0.61–1.24)
GFR, Estimated: 54 mL/min — ABNORMAL LOW (ref >60)
Glucose, Bld: 120 mg/dL — ABNORMAL HIGH (ref 70–99)
Potassium: 3.7 mmol/L (ref 3.5–5.1)
Sodium: 138 mmol/L (ref 135–145)

## 2024-03-28 LAB — URINE DRUG SCREEN, QUALITATIVE (ARMC ONLY)
Amphetamines, Ur Screen: NOT DETECTED
Barbiturates, Ur Screen: NOT DETECTED
Benzodiazepine, Ur Scrn: NOT DETECTED
Cannabinoid 50 Ng, Ur ~~LOC~~: NOT DETECTED
Cocaine Metabolite,Ur ~~LOC~~: NOT DETECTED
MDMA (Ecstasy)Ur Screen: NOT DETECTED
Methadone Scn, Ur: NOT DETECTED
Opiate, Ur Screen: POSITIVE — AB
Phencyclidine (PCP) Ur S: NOT DETECTED
Tricyclic, Ur Screen: POSITIVE — AB

## 2024-03-28 LAB — URINALYSIS, ROUTINE W REFLEX MICROSCOPIC
Bacteria, UA: NONE SEEN
Bilirubin Urine: NEGATIVE
Glucose, UA: NEGATIVE mg/dL
Hgb urine dipstick: NEGATIVE
Ketones, ur: NEGATIVE mg/dL
Nitrite: NEGATIVE
Protein, ur: 100 mg/dL — AB
Specific Gravity, Urine: 1.014 (ref 1.005–1.030)
WBC, UA: >50 WBC/hpf (ref 0–5)
pH: 6 (ref 5.0–8.0)

## 2024-03-28 MED ORDER — CEPHALEXIN 500 MG PO CAPS: 500.0000 mg | ORAL_CAPSULE | Freq: Two times a day (BID) | ORAL | 0 refills | Status: DC

## 2024-03-28 MED ORDER — CEPHALEXIN 500 MG PO CAPS: 500.0000 mg | ORAL_CAPSULE | Freq: Two times a day (BID) | ORAL | 0 refills | Status: AC

## 2024-03-28 NOTE — ED Triage Notes (Addendum)
 See First Nurse Note for triage. PT denies any complaints at this time.

## 2024-03-28 NOTE — Discharge Instructions (Signed)
 We have started him on some antibiotics for possible UTI.  He should follow-up with his primary care doctor to adjust his blood pressure medications but I do not see anything emergent going on he denied any symptoms.  His drug screen is pending and he can follow this up in MyChart

## 2024-03-28 NOTE — ED Provider Notes (Addendum)
 [facility] Provider Note    Event Date/Time   First MD Initiated Contact with Patient 03/28/24 1253     (approximate)   History   Hypertension   HPI  Donald [name]. is a 63 y.o. male   with CKD, COPD who comes in with concerns for elevated blood pressure.  Patient reports that they sent him here due to his elevated blood pressure.  He states he does not have any symptoms and I did give him his blood pressure medicine this morning.  Denies any chest pain, abdominal pain, headaches, falls or hit his head or any other concerns.  When I asked him about needing a drug test he states that the facility called him using some extra drugs a few months ago and they wanted him to get another drug test done.  He states that he is no problem doing the drug test he just could not urinate when I asked him this morning so they sent him here more for the blood pressure but he was willing to do a drug test if needed.  Patient denies any falls or hitting his head or any other concerns.  He is on Eliquis .  Physical Exam   Triage Vital Signs: ED Triage Vitals  Encounter Vitals Group     BP 03/28/24 1118 (!) 172/101     Girls Systolic BP Percentile --      Girls Diastolic BP Percentile --      Boys Systolic BP Percentile --      Boys Diastolic BP Percentile --      Pulse Rate 03/28/24 1118 98     Resp 03/28/24 1118 17     Temp 03/28/24 1118 98.1 F (36.7 C)     Temp Source 03/28/24 1118 Oral     SpO2 03/28/24 1112 97 %     Weight 03/28/24 1118 100 lb (45.4 kg)     Height 03/28/24 1118 5' 8 (1.727 m)     Head Circumference --      Peak Flow --      Pain Score 03/28/24 1118 8     Pain Loc --      Pain Education --      Exclude from Growth Chart --     Most recent vital signs: Vitals:   03/28/24 1112 03/28/24 1118  BP:  (!) 172/101  Pulse:  98  Resp:  17  Temp:  98.1 F (36.7 C)  SpO2: 97% 95%     General: Awake, no distress.  CV:  Good peripheral  perfusion.  Resp:  Normal effort.  Abd:  No distention.  Soft nontender Other:  Cranial nerves II through XII are intact equal strength in arms and legs   ED Results / Procedures / Treatments   Labs (all labs ordered are listed, but only abnormal results are displayed) Labs Reviewed  CBC WITH DIFFERENTIAL/PLATELET - Abnormal; Notable for the following components:      Result Value   WBC 12.6 (*)    Neutro Abs 10.3 (*)    All other components within normal limits  BASIC METABOLIC PANEL WITH GFR - Abnormal; Notable for the following components:   Chloride 97 (*)    Glucose, Bld 120 (*)    Creatinine, Ser 1.46 (*)    GFR, Estimated 54 (*)    All other components within normal limits    PROCEDURES:  Critical Care performed: No  Procedures   MEDICATIONS ORDERED IN ED:  Medications - No data to display   IMPRESSION / MDM / ASSESSMENT AND PLAN / ED COURSE  I reviewed the triage vital signs and the nursing notes.   Patient's presentation is most consistent with acute, uncomplicated illness.   Patient comes in with asymptomatic hypertension.  BMP shows creatinine that is around baseline.  CBC shows slightly elevated white count but he denied any infectious symptoms patient is nonfebrile.   On review of patient's blood pressure medicine it appears that he is on Cardizem , Eliquis , lisinopril, metoprolol .  I reviewed his visit from 03/01/2024 where patient's blood pressure was 175/100 so this, looks like where patient typically runs.  He was actually seen on 11/23/2023 for similar elevated blood pressure with PCP follow-up.  Discussed with facility and they had no other concerns it was just that his blood pressure was elevated and they given him a dose of as needed hydralazine  it was still elevated discussed that since his asymptomatic hypertension that they can continue to adjust his medications at facility but no indication for admission to the hospital.  His kidney function looks  good and he is asymptomatic we did discuss return precautions in regards to elevated blood pressure and expressed understanding.  They did request that we do the urine drug screen given patient is able to give a urine here and patient did consent for it however this will not stop patient from being discharged.  They expressed understanding.  I did explain to them that if he is on oxycodone  that it will be positive due to this but it can test for other things if they are worried about that.  They expressed understanding and would be able to come pick patient up when patient is discharged  Patient's urine was cloudy and given his elevated white count although he denied any symptoms we did send for urine and this was concerning for a UTI.  I will cover patient with some Keflex .  Patient expressed understanding felt comfortable with this plan    FINAL CLINICAL IMPRESSION(S) / ED DIAGNOSES   Final diagnoses:  Hypertension, unspecified type  Encounter for drug screening  Acute cystitis without hematuria     Rx / DC Orders   ED Discharge Orders     None        Note:  This document was prepared using Dragon voice recognition software and may include unintentional dictation errors.   Ernest Ronal BRAVO, MD 03/28/24 1307    Ernest Ronal BRAVO, MD 03/28/24 [phone]

## 2024-03-28 NOTE — ED Notes (Signed)
 Patient denies chest pain, shortness of breath, dizziness, or pain. MD Ernest aware of discharge vitals- verbalized it is okay to discharge due to patient being asymptomatic. Patient wheeled to lobby at this time, where [facility] was here to pick patient up. Discharge instructions discussed with worker and patient. E-signature not working at this time. Pt and staff verbalized understanding of D/C instructions, prescriptions and follow up care with no further questions at this time. Pt in NAD and wheeled to car at time of D/C.

## 2024-03-28 NOTE — ED Triage Notes (Signed)
 First Nurse Note: Pt to ED via [facility] from [facility] for high blood pressure and a drug screen. Per staff pt has a hx/o drug abuse and they think pt is abusing his medication although staff administers pts medications. Pt was given his oxycodone  this morning. Pt has hx/o HTN and was given meds for that 1 hour prior to EMS being called.   BP: 182/100

## 2024-03-29 ENCOUNTER — Encounter: Payer: Self-pay | Admitting: Acute Care

## 2024-03-29 DIAGNOSIS — I6330 Cerebral infarction due to thrombosis of unspecified cerebral artery: Secondary | ICD-10-CM | POA: Diagnosis not present

## 2024-03-30 DIAGNOSIS — I6330 Cerebral infarction due to thrombosis of unspecified cerebral artery: Secondary | ICD-10-CM | POA: Diagnosis not present

## 2024-03-30 LAB — URINE CULTURE: Culture: >=100000 — AB

## 2024-03-31 DIAGNOSIS — I6330 Cerebral infarction due to thrombosis of unspecified cerebral artery: Secondary | ICD-10-CM | POA: Diagnosis not present

## 2024-04-01 DIAGNOSIS — I6330 Cerebral infarction due to thrombosis of unspecified cerebral artery: Secondary | ICD-10-CM | POA: Diagnosis not present

## 2024-04-02 DIAGNOSIS — I6330 Cerebral infarction due to thrombosis of unspecified cerebral artery: Secondary | ICD-10-CM | POA: Diagnosis not present

## 2024-04-03 DIAGNOSIS — I6330 Cerebral infarction due to thrombosis of unspecified cerebral artery: Secondary | ICD-10-CM | POA: Diagnosis not present

## 2024-04-04 DIAGNOSIS — J449 Chronic obstructive pulmonary disease, unspecified: Secondary | ICD-10-CM | POA: Diagnosis not present

## 2024-04-04 DIAGNOSIS — G4700 Insomnia, unspecified: Secondary | ICD-10-CM | POA: Diagnosis not present

## 2024-04-04 DIAGNOSIS — E46 Unspecified protein-calorie malnutrition: Secondary | ICD-10-CM | POA: Diagnosis not present

## 2024-04-04 DIAGNOSIS — K5900 Constipation, unspecified: Secondary | ICD-10-CM | POA: Diagnosis not present

## 2024-04-04 DIAGNOSIS — N400 Enlarged prostate without lower urinary tract symptoms: Secondary | ICD-10-CM | POA: Diagnosis not present

## 2024-04-04 DIAGNOSIS — I6330 Cerebral infarction due to thrombosis of unspecified cerebral artery: Secondary | ICD-10-CM | POA: Diagnosis not present

## 2024-04-04 DIAGNOSIS — I10 Essential (primary) hypertension: Secondary | ICD-10-CM | POA: Diagnosis not present

## 2024-04-04 DIAGNOSIS — M549 Dorsalgia, unspecified: Secondary | ICD-10-CM | POA: Diagnosis not present

## 2024-04-04 DIAGNOSIS — I739 Peripheral vascular disease, unspecified: Secondary | ICD-10-CM | POA: Diagnosis not present

## 2024-04-04 DIAGNOSIS — J439 Emphysema, unspecified: Secondary | ICD-10-CM | POA: Diagnosis not present

## 2024-04-04 DIAGNOSIS — N3000 Acute cystitis without hematuria: Secondary | ICD-10-CM | POA: Diagnosis not present

## 2024-04-04 DIAGNOSIS — G8929 Other chronic pain: Secondary | ICD-10-CM | POA: Diagnosis not present

## 2024-04-05 DIAGNOSIS — I6330 Cerebral infarction due to thrombosis of unspecified cerebral artery: Secondary | ICD-10-CM | POA: Diagnosis not present

## 2024-04-06 DIAGNOSIS — I6330 Cerebral infarction due to thrombosis of unspecified cerebral artery: Secondary | ICD-10-CM | POA: Diagnosis not present

## 2024-04-07 DIAGNOSIS — I6330 Cerebral infarction due to thrombosis of unspecified cerebral artery: Secondary | ICD-10-CM | POA: Diagnosis not present

## 2024-04-08 DIAGNOSIS — I6330 Cerebral infarction due to thrombosis of unspecified cerebral artery: Secondary | ICD-10-CM | POA: Diagnosis not present

## 2024-04-09 DIAGNOSIS — I6330 Cerebral infarction due to thrombosis of unspecified cerebral artery: Secondary | ICD-10-CM | POA: Diagnosis not present

## 2024-04-10 DIAGNOSIS — I6330 Cerebral infarction due to thrombosis of unspecified cerebral artery: Secondary | ICD-10-CM | POA: Diagnosis not present

## 2024-04-11 DIAGNOSIS — I6330 Cerebral infarction due to thrombosis of unspecified cerebral artery: Secondary | ICD-10-CM | POA: Diagnosis not present

## 2024-04-12 DIAGNOSIS — I6330 Cerebral infarction due to thrombosis of unspecified cerebral artery: Secondary | ICD-10-CM | POA: Diagnosis not present

## 2024-04-13 DIAGNOSIS — I6330 Cerebral infarction due to thrombosis of unspecified cerebral artery: Secondary | ICD-10-CM | POA: Diagnosis not present

## 2024-04-14 DIAGNOSIS — I6330 Cerebral infarction due to thrombosis of unspecified cerebral artery: Secondary | ICD-10-CM | POA: Diagnosis not present

## 2024-04-15 DIAGNOSIS — I6330 Cerebral infarction due to thrombosis of unspecified cerebral artery: Secondary | ICD-10-CM | POA: Diagnosis not present

## 2024-04-16 DIAGNOSIS — I6330 Cerebral infarction due to thrombosis of unspecified cerebral artery: Secondary | ICD-10-CM | POA: Diagnosis not present

## 2024-04-17 DIAGNOSIS — I6330 Cerebral infarction due to thrombosis of unspecified cerebral artery: Secondary | ICD-10-CM | POA: Diagnosis not present

## 2024-04-17 DIAGNOSIS — E782 Mixed hyperlipidemia: Secondary | ICD-10-CM | POA: Diagnosis not present

## 2024-04-17 DIAGNOSIS — Z79899 Other long term (current) drug therapy: Secondary | ICD-10-CM | POA: Diagnosis not present

## 2024-04-17 DIAGNOSIS — D519 Vitamin B12 deficiency anemia, unspecified: Secondary | ICD-10-CM | POA: Diagnosis not present

## 2024-04-17 DIAGNOSIS — R7309 Other abnormal glucose: Secondary | ICD-10-CM | POA: Diagnosis not present

## 2024-04-18 DIAGNOSIS — Z419 Encounter for procedure for purposes other than remedying health state, unspecified: Secondary | ICD-10-CM | POA: Diagnosis not present

## 2024-04-18 DIAGNOSIS — I6330 Cerebral infarction due to thrombosis of unspecified cerebral artery: Secondary | ICD-10-CM | POA: Diagnosis not present

## 2024-04-19 DIAGNOSIS — I6330 Cerebral infarction due to thrombosis of unspecified cerebral artery: Secondary | ICD-10-CM | POA: Diagnosis not present

## 2024-04-20 DIAGNOSIS — I6330 Cerebral infarction due to thrombosis of unspecified cerebral artery: Secondary | ICD-10-CM | POA: Diagnosis not present

## 2024-04-21 DIAGNOSIS — I6330 Cerebral infarction due to thrombosis of unspecified cerebral artery: Secondary | ICD-10-CM | POA: Diagnosis not present

## 2024-04-22 DIAGNOSIS — I6330 Cerebral infarction due to thrombosis of unspecified cerebral artery: Secondary | ICD-10-CM | POA: Diagnosis not present

## 2024-04-23 DIAGNOSIS — I6330 Cerebral infarction due to thrombosis of unspecified cerebral artery: Secondary | ICD-10-CM | POA: Diagnosis not present

## 2024-04-24 DIAGNOSIS — I6330 Cerebral infarction due to thrombosis of unspecified cerebral artery: Secondary | ICD-10-CM | POA: Diagnosis not present

## 2024-04-25 DIAGNOSIS — I6330 Cerebral infarction due to thrombosis of unspecified cerebral artery: Secondary | ICD-10-CM | POA: Diagnosis not present

## 2024-04-26 DIAGNOSIS — I6330 Cerebral infarction due to thrombosis of unspecified cerebral artery: Secondary | ICD-10-CM | POA: Diagnosis not present

## 2024-04-27 DIAGNOSIS — I6330 Cerebral infarction due to thrombosis of unspecified cerebral artery: Secondary | ICD-10-CM | POA: Diagnosis not present

## 2024-04-28 DIAGNOSIS — I6330 Cerebral infarction due to thrombosis of unspecified cerebral artery: Secondary | ICD-10-CM | POA: Diagnosis not present

## 2024-04-29 DIAGNOSIS — I6330 Cerebral infarction due to thrombosis of unspecified cerebral artery: Secondary | ICD-10-CM | POA: Diagnosis not present

## 2024-04-30 DIAGNOSIS — I6330 Cerebral infarction due to thrombosis of unspecified cerebral artery: Secondary | ICD-10-CM | POA: Diagnosis not present

## 2024-05-01 DIAGNOSIS — I6330 Cerebral infarction due to thrombosis of unspecified cerebral artery: Secondary | ICD-10-CM | POA: Diagnosis not present

## 2024-05-01 DIAGNOSIS — Z79899 Other long term (current) drug therapy: Secondary | ICD-10-CM | POA: Diagnosis not present

## 2024-05-01 DIAGNOSIS — Z5181 Encounter for therapeutic drug level monitoring: Secondary | ICD-10-CM | POA: Diagnosis not present

## 2024-05-02 DIAGNOSIS — G4700 Insomnia, unspecified: Secondary | ICD-10-CM | POA: Diagnosis not present

## 2024-05-02 DIAGNOSIS — G8929 Other chronic pain: Secondary | ICD-10-CM | POA: Diagnosis not present

## 2024-05-02 DIAGNOSIS — M549 Dorsalgia, unspecified: Secondary | ICD-10-CM | POA: Diagnosis not present

## 2024-05-02 DIAGNOSIS — I10 Essential (primary) hypertension: Secondary | ICD-10-CM | POA: Diagnosis not present

## 2024-05-02 DIAGNOSIS — I739 Peripheral vascular disease, unspecified: Secondary | ICD-10-CM | POA: Diagnosis not present

## 2024-05-02 DIAGNOSIS — E785 Hyperlipidemia, unspecified: Secondary | ICD-10-CM | POA: Diagnosis not present

## 2024-05-02 DIAGNOSIS — G629 Polyneuropathy, unspecified: Secondary | ICD-10-CM | POA: Diagnosis not present

## 2024-05-02 DIAGNOSIS — N400 Enlarged prostate without lower urinary tract symptoms: Secondary | ICD-10-CM | POA: Diagnosis not present

## 2024-05-02 DIAGNOSIS — N189 Chronic kidney disease, unspecified: Secondary | ICD-10-CM | POA: Diagnosis not present

## 2024-05-02 DIAGNOSIS — H04123 Dry eye syndrome of bilateral lacrimal glands: Secondary | ICD-10-CM | POA: Diagnosis not present

## 2024-05-02 DIAGNOSIS — J449 Chronic obstructive pulmonary disease, unspecified: Secondary | ICD-10-CM | POA: Diagnosis not present

## 2024-05-02 DIAGNOSIS — I6330 Cerebral infarction due to thrombosis of unspecified cerebral artery: Secondary | ICD-10-CM | POA: Diagnosis not present

## 2024-05-03 DIAGNOSIS — I6330 Cerebral infarction due to thrombosis of unspecified cerebral artery: Secondary | ICD-10-CM | POA: Diagnosis not present

## 2024-05-04 DIAGNOSIS — I6330 Cerebral infarction due to thrombosis of unspecified cerebral artery: Secondary | ICD-10-CM | POA: Diagnosis not present

## 2024-05-05 DIAGNOSIS — I6330 Cerebral infarction due to thrombosis of unspecified cerebral artery: Secondary | ICD-10-CM | POA: Diagnosis not present

## 2024-05-06 DIAGNOSIS — I6330 Cerebral infarction due to thrombosis of unspecified cerebral artery: Secondary | ICD-10-CM | POA: Diagnosis not present

## 2024-05-07 DIAGNOSIS — I6330 Cerebral infarction due to thrombosis of unspecified cerebral artery: Secondary | ICD-10-CM | POA: Diagnosis not present

## 2024-05-08 DIAGNOSIS — I6330 Cerebral infarction due to thrombosis of unspecified cerebral artery: Secondary | ICD-10-CM | POA: Diagnosis not present

## 2024-05-09 DIAGNOSIS — I6330 Cerebral infarction due to thrombosis of unspecified cerebral artery: Secondary | ICD-10-CM | POA: Diagnosis not present

## 2024-05-10 DIAGNOSIS — I6330 Cerebral infarction due to thrombosis of unspecified cerebral artery: Secondary | ICD-10-CM | POA: Diagnosis not present

## 2024-05-11 DIAGNOSIS — I6330 Cerebral infarction due to thrombosis of unspecified cerebral artery: Secondary | ICD-10-CM | POA: Diagnosis not present

## 2024-05-12 DIAGNOSIS — I6330 Cerebral infarction due to thrombosis of unspecified cerebral artery: Secondary | ICD-10-CM | POA: Diagnosis not present

## 2024-05-13 DIAGNOSIS — I6330 Cerebral infarction due to thrombosis of unspecified cerebral artery: Secondary | ICD-10-CM | POA: Diagnosis not present

## 2024-05-14 DIAGNOSIS — I6330 Cerebral infarction due to thrombosis of unspecified cerebral artery: Secondary | ICD-10-CM | POA: Diagnosis not present

## 2024-05-15 DIAGNOSIS — I6330 Cerebral infarction due to thrombosis of unspecified cerebral artery: Secondary | ICD-10-CM | POA: Diagnosis not present

## 2024-05-16 DIAGNOSIS — I739 Peripheral vascular disease, unspecified: Secondary | ICD-10-CM | POA: Diagnosis not present

## 2024-05-16 DIAGNOSIS — G4700 Insomnia, unspecified: Secondary | ICD-10-CM | POA: Diagnosis not present

## 2024-05-16 DIAGNOSIS — E785 Hyperlipidemia, unspecified: Secondary | ICD-10-CM | POA: Diagnosis not present

## 2024-05-16 DIAGNOSIS — J449 Chronic obstructive pulmonary disease, unspecified: Secondary | ICD-10-CM | POA: Diagnosis not present

## 2024-05-16 DIAGNOSIS — G629 Polyneuropathy, unspecified: Secondary | ICD-10-CM | POA: Diagnosis not present

## 2024-05-16 DIAGNOSIS — E46 Unspecified protein-calorie malnutrition: Secondary | ICD-10-CM | POA: Diagnosis not present

## 2024-05-16 DIAGNOSIS — I10 Essential (primary) hypertension: Secondary | ICD-10-CM | POA: Diagnosis not present

## 2024-05-16 DIAGNOSIS — I6330 Cerebral infarction due to thrombosis of unspecified cerebral artery: Secondary | ICD-10-CM | POA: Diagnosis not present

## 2024-05-17 DIAGNOSIS — I6330 Cerebral infarction due to thrombosis of unspecified cerebral artery: Secondary | ICD-10-CM | POA: Diagnosis not present

## 2024-05-18 DIAGNOSIS — I6330 Cerebral infarction due to thrombosis of unspecified cerebral artery: Secondary | ICD-10-CM | POA: Diagnosis not present

## 2024-05-19 DIAGNOSIS — I6330 Cerebral infarction due to thrombosis of unspecified cerebral artery: Secondary | ICD-10-CM | POA: Diagnosis not present

## 2024-05-19 DIAGNOSIS — Z419 Encounter for procedure for purposes other than remedying health state, unspecified: Secondary | ICD-10-CM | POA: Diagnosis not present

## 2024-05-20 DIAGNOSIS — I6330 Cerebral infarction due to thrombosis of unspecified cerebral artery: Secondary | ICD-10-CM | POA: Diagnosis not present

## 2024-05-21 DIAGNOSIS — I6330 Cerebral infarction due to thrombosis of unspecified cerebral artery: Secondary | ICD-10-CM | POA: Diagnosis not present

## 2024-05-22 DIAGNOSIS — I6330 Cerebral infarction due to thrombosis of unspecified cerebral artery: Secondary | ICD-10-CM | POA: Diagnosis not present

## 2024-05-23 DIAGNOSIS — I6330 Cerebral infarction due to thrombosis of unspecified cerebral artery: Secondary | ICD-10-CM | POA: Diagnosis not present

## 2024-05-24 DIAGNOSIS — I6330 Cerebral infarction due to thrombosis of unspecified cerebral artery: Secondary | ICD-10-CM | POA: Diagnosis not present

## 2024-05-25 DIAGNOSIS — I6330 Cerebral infarction due to thrombosis of unspecified cerebral artery: Secondary | ICD-10-CM | POA: Diagnosis not present

## 2024-05-26 DIAGNOSIS — I6330 Cerebral infarction due to thrombosis of unspecified cerebral artery: Secondary | ICD-10-CM | POA: Diagnosis not present

## 2024-05-27 DIAGNOSIS — I6330 Cerebral infarction due to thrombosis of unspecified cerebral artery: Secondary | ICD-10-CM | POA: Diagnosis not present

## 2024-05-28 DIAGNOSIS — I6330 Cerebral infarction due to thrombosis of unspecified cerebral artery: Secondary | ICD-10-CM | POA: Diagnosis not present

## 2024-05-29 DIAGNOSIS — I6330 Cerebral infarction due to thrombosis of unspecified cerebral artery: Secondary | ICD-10-CM | POA: Diagnosis not present

## 2024-05-30 DIAGNOSIS — I6330 Cerebral infarction due to thrombosis of unspecified cerebral artery: Secondary | ICD-10-CM | POA: Diagnosis not present

## 2024-05-31 DIAGNOSIS — I6330 Cerebral infarction due to thrombosis of unspecified cerebral artery: Secondary | ICD-10-CM | POA: Diagnosis not present

## 2024-06-01 DIAGNOSIS — I6330 Cerebral infarction due to thrombosis of unspecified cerebral artery: Secondary | ICD-10-CM | POA: Diagnosis not present

## 2024-06-02 DIAGNOSIS — I6330 Cerebral infarction due to thrombosis of unspecified cerebral artery: Secondary | ICD-10-CM | POA: Diagnosis not present

## 2024-06-03 DIAGNOSIS — I6330 Cerebral infarction due to thrombosis of unspecified cerebral artery: Secondary | ICD-10-CM | POA: Diagnosis not present

## 2024-06-04 DIAGNOSIS — I6330 Cerebral infarction due to thrombosis of unspecified cerebral artery: Secondary | ICD-10-CM | POA: Diagnosis not present

## 2024-06-05 DIAGNOSIS — I6330 Cerebral infarction due to thrombosis of unspecified cerebral artery: Secondary | ICD-10-CM | POA: Diagnosis not present

## 2024-06-06 DIAGNOSIS — J439 Emphysema, unspecified: Secondary | ICD-10-CM | POA: Diagnosis not present

## 2024-06-06 DIAGNOSIS — H04123 Dry eye syndrome of bilateral lacrimal glands: Secondary | ICD-10-CM | POA: Diagnosis not present

## 2024-06-06 DIAGNOSIS — I10 Essential (primary) hypertension: Secondary | ICD-10-CM | POA: Diagnosis not present

## 2024-06-06 DIAGNOSIS — K5900 Constipation, unspecified: Secondary | ICD-10-CM | POA: Diagnosis not present

## 2024-06-06 DIAGNOSIS — I739 Peripheral vascular disease, unspecified: Secondary | ICD-10-CM | POA: Diagnosis not present

## 2024-06-06 DIAGNOSIS — I6330 Cerebral infarction due to thrombosis of unspecified cerebral artery: Secondary | ICD-10-CM | POA: Diagnosis not present

## 2024-06-06 DIAGNOSIS — N400 Enlarged prostate without lower urinary tract symptoms: Secondary | ICD-10-CM | POA: Diagnosis not present

## 2024-06-06 DIAGNOSIS — J449 Chronic obstructive pulmonary disease, unspecified: Secondary | ICD-10-CM | POA: Diagnosis not present

## 2024-06-06 DIAGNOSIS — E559 Vitamin D deficiency, unspecified: Secondary | ICD-10-CM | POA: Diagnosis not present

## 2024-06-06 DIAGNOSIS — M549 Dorsalgia, unspecified: Secondary | ICD-10-CM | POA: Diagnosis not present

## 2024-06-06 DIAGNOSIS — G4700 Insomnia, unspecified: Secondary | ICD-10-CM | POA: Diagnosis not present

## 2024-06-07 DIAGNOSIS — I6330 Cerebral infarction due to thrombosis of unspecified cerebral artery: Secondary | ICD-10-CM | POA: Diagnosis not present

## 2024-06-08 DIAGNOSIS — I6330 Cerebral infarction due to thrombosis of unspecified cerebral artery: Secondary | ICD-10-CM | POA: Diagnosis not present

## 2024-06-09 DIAGNOSIS — I6330 Cerebral infarction due to thrombosis of unspecified cerebral artery: Secondary | ICD-10-CM | POA: Diagnosis not present

## 2024-06-10 DIAGNOSIS — I6330 Cerebral infarction due to thrombosis of unspecified cerebral artery: Secondary | ICD-10-CM | POA: Diagnosis not present

## 2024-06-11 DIAGNOSIS — I6330 Cerebral infarction due to thrombosis of unspecified cerebral artery: Secondary | ICD-10-CM | POA: Diagnosis not present

## 2024-06-12 DIAGNOSIS — E559 Vitamin D deficiency, unspecified: Secondary | ICD-10-CM | POA: Diagnosis not present

## 2024-06-12 DIAGNOSIS — D519 Vitamin B12 deficiency anemia, unspecified: Secondary | ICD-10-CM | POA: Diagnosis not present

## 2024-06-12 DIAGNOSIS — I6330 Cerebral infarction due to thrombosis of unspecified cerebral artery: Secondary | ICD-10-CM | POA: Diagnosis not present

## 2024-06-12 DIAGNOSIS — E782 Mixed hyperlipidemia: Secondary | ICD-10-CM | POA: Diagnosis not present

## 2024-06-12 DIAGNOSIS — R7309 Other abnormal glucose: Secondary | ICD-10-CM | POA: Diagnosis not present

## 2024-06-12 DIAGNOSIS — Z79899 Other long term (current) drug therapy: Secondary | ICD-10-CM | POA: Diagnosis not present

## 2024-06-12 DIAGNOSIS — E038 Other specified hypothyroidism: Secondary | ICD-10-CM | POA: Diagnosis not present

## 2024-06-13 DIAGNOSIS — I6330 Cerebral infarction due to thrombosis of unspecified cerebral artery: Secondary | ICD-10-CM | POA: Diagnosis not present

## 2024-06-14 DIAGNOSIS — I6330 Cerebral infarction due to thrombosis of unspecified cerebral artery: Secondary | ICD-10-CM | POA: Diagnosis not present

## 2024-06-15 DIAGNOSIS — I6330 Cerebral infarction due to thrombosis of unspecified cerebral artery: Secondary | ICD-10-CM | POA: Diagnosis not present

## 2024-06-18 DIAGNOSIS — I6330 Cerebral infarction due to thrombosis of unspecified cerebral artery: Secondary | ICD-10-CM | POA: Diagnosis not present

## 2024-06-20 DIAGNOSIS — I6330 Cerebral infarction due to thrombosis of unspecified cerebral artery: Secondary | ICD-10-CM | POA: Diagnosis not present

## 2024-06-21 DIAGNOSIS — I6330 Cerebral infarction due to thrombosis of unspecified cerebral artery: Secondary | ICD-10-CM | POA: Diagnosis not present

## 2024-06-22 DIAGNOSIS — I6330 Cerebral infarction due to thrombosis of unspecified cerebral artery: Secondary | ICD-10-CM | POA: Diagnosis not present

## 2024-06-23 DIAGNOSIS — I6330 Cerebral infarction due to thrombosis of unspecified cerebral artery: Secondary | ICD-10-CM | POA: Diagnosis not present

## 2024-06-24 DIAGNOSIS — I6330 Cerebral infarction due to thrombosis of unspecified cerebral artery: Secondary | ICD-10-CM | POA: Diagnosis not present

## 2024-06-26 DIAGNOSIS — I6330 Cerebral infarction due to thrombosis of unspecified cerebral artery: Secondary | ICD-10-CM | POA: Diagnosis not present

## 2024-06-27 DIAGNOSIS — I6330 Cerebral infarction due to thrombosis of unspecified cerebral artery: Secondary | ICD-10-CM | POA: Diagnosis not present

## 2024-06-28 DIAGNOSIS — I6330 Cerebral infarction due to thrombosis of unspecified cerebral artery: Secondary | ICD-10-CM | POA: Diagnosis not present

## 2024-06-29 DIAGNOSIS — I6330 Cerebral infarction due to thrombosis of unspecified cerebral artery: Secondary | ICD-10-CM | POA: Diagnosis not present

## 2024-06-30 DIAGNOSIS — I6330 Cerebral infarction due to thrombosis of unspecified cerebral artery: Secondary | ICD-10-CM | POA: Diagnosis not present

## 2024-07-01 DIAGNOSIS — I6330 Cerebral infarction due to thrombosis of unspecified cerebral artery: Secondary | ICD-10-CM | POA: Diagnosis not present

## 2024-07-02 DIAGNOSIS — I6330 Cerebral infarction due to thrombosis of unspecified cerebral artery: Secondary | ICD-10-CM | POA: Diagnosis not present

## 2024-07-03 DIAGNOSIS — I6330 Cerebral infarction due to thrombosis of unspecified cerebral artery: Secondary | ICD-10-CM | POA: Diagnosis not present

## 2024-07-03 DIAGNOSIS — F419 Anxiety disorder, unspecified: Secondary | ICD-10-CM | POA: Diagnosis not present

## 2024-07-03 DIAGNOSIS — F5104 Psychophysiologic insomnia: Secondary | ICD-10-CM | POA: Diagnosis not present

## 2024-07-04 DIAGNOSIS — J449 Chronic obstructive pulmonary disease, unspecified: Secondary | ICD-10-CM | POA: Diagnosis not present

## 2024-07-04 DIAGNOSIS — K5900 Constipation, unspecified: Secondary | ICD-10-CM | POA: Diagnosis not present

## 2024-07-04 DIAGNOSIS — G4700 Insomnia, unspecified: Secondary | ICD-10-CM | POA: Diagnosis not present

## 2024-07-04 DIAGNOSIS — N400 Enlarged prostate without lower urinary tract symptoms: Secondary | ICD-10-CM | POA: Diagnosis not present

## 2024-07-04 DIAGNOSIS — E785 Hyperlipidemia, unspecified: Secondary | ICD-10-CM | POA: Diagnosis not present

## 2024-07-04 DIAGNOSIS — I739 Peripheral vascular disease, unspecified: Secondary | ICD-10-CM | POA: Diagnosis not present

## 2024-07-04 DIAGNOSIS — H04123 Dry eye syndrome of bilateral lacrimal glands: Secondary | ICD-10-CM | POA: Diagnosis not present

## 2024-07-04 DIAGNOSIS — M549 Dorsalgia, unspecified: Secondary | ICD-10-CM | POA: Diagnosis not present

## 2024-07-04 DIAGNOSIS — G629 Polyneuropathy, unspecified: Secondary | ICD-10-CM | POA: Diagnosis not present

## 2024-07-04 DIAGNOSIS — G8929 Other chronic pain: Secondary | ICD-10-CM | POA: Diagnosis not present

## 2024-07-04 DIAGNOSIS — I6330 Cerebral infarction due to thrombosis of unspecified cerebral artery: Secondary | ICD-10-CM | POA: Diagnosis not present

## 2024-07-04 DIAGNOSIS — N189 Chronic kidney disease, unspecified: Secondary | ICD-10-CM | POA: Diagnosis not present

## 2024-07-05 DIAGNOSIS — I6330 Cerebral infarction due to thrombosis of unspecified cerebral artery: Secondary | ICD-10-CM | POA: Diagnosis not present

## 2024-07-06 DIAGNOSIS — I6330 Cerebral infarction due to thrombosis of unspecified cerebral artery: Secondary | ICD-10-CM | POA: Diagnosis not present

## 2024-07-07 DIAGNOSIS — I6330 Cerebral infarction due to thrombosis of unspecified cerebral artery: Secondary | ICD-10-CM | POA: Diagnosis not present

## 2024-07-09 DIAGNOSIS — I6330 Cerebral infarction due to thrombosis of unspecified cerebral artery: Secondary | ICD-10-CM | POA: Diagnosis not present

## 2024-07-10 DIAGNOSIS — I6330 Cerebral infarction due to thrombosis of unspecified cerebral artery: Secondary | ICD-10-CM | POA: Diagnosis not present

## 2024-07-11 DIAGNOSIS — I6330 Cerebral infarction due to thrombosis of unspecified cerebral artery: Secondary | ICD-10-CM | POA: Diagnosis not present

## 2024-07-12 DIAGNOSIS — I6330 Cerebral infarction due to thrombosis of unspecified cerebral artery: Secondary | ICD-10-CM | POA: Diagnosis not present

## 2024-07-13 ENCOUNTER — Other Ambulatory Visit: Payer: Self-pay | Admitting: Emergency Medicine

## 2024-07-13 DIAGNOSIS — F17210 Nicotine dependence, cigarettes, uncomplicated: Secondary | ICD-10-CM | POA: Diagnosis not present

## 2024-07-13 DIAGNOSIS — J432 Centrilobular emphysema: Secondary | ICD-10-CM | POA: Diagnosis not present

## 2024-07-13 DIAGNOSIS — I6330 Cerebral infarction due to thrombosis of unspecified cerebral artery: Secondary | ICD-10-CM | POA: Diagnosis not present

## 2024-07-13 DIAGNOSIS — Z87891 Personal history of nicotine dependence: Secondary | ICD-10-CM

## 2024-07-13 DIAGNOSIS — Z122 Encounter for screening for malignant neoplasm of respiratory organs: Secondary | ICD-10-CM

## 2024-07-15 DIAGNOSIS — I6330 Cerebral infarction due to thrombosis of unspecified cerebral artery: Secondary | ICD-10-CM | POA: Diagnosis not present

## 2024-07-17 DIAGNOSIS — I6330 Cerebral infarction due to thrombosis of unspecified cerebral artery: Secondary | ICD-10-CM | POA: Diagnosis not present

## 2024-07-18 DIAGNOSIS — I6330 Cerebral infarction due to thrombosis of unspecified cerebral artery: Secondary | ICD-10-CM | POA: Diagnosis not present

## 2024-07-20 DIAGNOSIS — R000 Tachycardia, unspecified: Secondary | ICD-10-CM | POA: Diagnosis not present

## 2024-07-20 DIAGNOSIS — I6330 Cerebral infarction due to thrombosis of unspecified cerebral artery: Secondary | ICD-10-CM | POA: Diagnosis not present

## 2024-07-20 DIAGNOSIS — I739 Peripheral vascular disease, unspecified: Secondary | ICD-10-CM | POA: Diagnosis not present

## 2024-07-20 DIAGNOSIS — J432 Centrilobular emphysema: Secondary | ICD-10-CM | POA: Diagnosis not present

## 2024-07-20 DIAGNOSIS — Z8673 Personal history of transient ischemic attack (TIA), and cerebral infarction without residual deficits: Secondary | ICD-10-CM | POA: Diagnosis not present

## 2024-07-21 DIAGNOSIS — I6330 Cerebral infarction due to thrombosis of unspecified cerebral artery: Secondary | ICD-10-CM | POA: Diagnosis not present

## 2024-07-24 DIAGNOSIS — I6330 Cerebral infarction due to thrombosis of unspecified cerebral artery: Secondary | ICD-10-CM | POA: Diagnosis not present

## 2024-07-25 DIAGNOSIS — I6330 Cerebral infarction due to thrombosis of unspecified cerebral artery: Secondary | ICD-10-CM | POA: Diagnosis not present

## 2024-07-26 DIAGNOSIS — I6330 Cerebral infarction due to thrombosis of unspecified cerebral artery: Secondary | ICD-10-CM | POA: Diagnosis not present

## 2024-07-27 DIAGNOSIS — I6330 Cerebral infarction due to thrombosis of unspecified cerebral artery: Secondary | ICD-10-CM | POA: Diagnosis not present

## 2024-07-28 DIAGNOSIS — I6330 Cerebral infarction due to thrombosis of unspecified cerebral artery: Secondary | ICD-10-CM | POA: Diagnosis not present

## 2024-07-29 DIAGNOSIS — I6330 Cerebral infarction due to thrombosis of unspecified cerebral artery: Secondary | ICD-10-CM | POA: Diagnosis not present

## 2024-07-30 DIAGNOSIS — I6330 Cerebral infarction due to thrombosis of unspecified cerebral artery: Secondary | ICD-10-CM | POA: Diagnosis not present

## 2024-07-31 DIAGNOSIS — I6330 Cerebral infarction due to thrombosis of unspecified cerebral artery: Secondary | ICD-10-CM | POA: Diagnosis not present

## 2024-08-01 DIAGNOSIS — H04123 Dry eye syndrome of bilateral lacrimal glands: Secondary | ICD-10-CM | POA: Diagnosis not present

## 2024-08-01 DIAGNOSIS — J439 Emphysema, unspecified: Secondary | ICD-10-CM | POA: Diagnosis not present

## 2024-08-01 DIAGNOSIS — E785 Hyperlipidemia, unspecified: Secondary | ICD-10-CM | POA: Diagnosis not present

## 2024-08-01 DIAGNOSIS — I739 Peripheral vascular disease, unspecified: Secondary | ICD-10-CM | POA: Diagnosis not present

## 2024-08-01 DIAGNOSIS — J449 Chronic obstructive pulmonary disease, unspecified: Secondary | ICD-10-CM | POA: Diagnosis not present

## 2024-08-01 DIAGNOSIS — I10 Essential (primary) hypertension: Secondary | ICD-10-CM | POA: Diagnosis not present

## 2024-08-01 DIAGNOSIS — N189 Chronic kidney disease, unspecified: Secondary | ICD-10-CM | POA: Diagnosis not present

## 2024-08-01 DIAGNOSIS — G8929 Other chronic pain: Secondary | ICD-10-CM | POA: Diagnosis not present

## 2024-08-01 DIAGNOSIS — G4700 Insomnia, unspecified: Secondary | ICD-10-CM | POA: Diagnosis not present

## 2024-08-01 DIAGNOSIS — I6330 Cerebral infarction due to thrombosis of unspecified cerebral artery: Secondary | ICD-10-CM | POA: Diagnosis not present

## 2024-08-01 DIAGNOSIS — M549 Dorsalgia, unspecified: Secondary | ICD-10-CM | POA: Diagnosis not present

## 2024-08-01 DIAGNOSIS — G629 Polyneuropathy, unspecified: Secondary | ICD-10-CM | POA: Diagnosis not present

## 2024-08-03 DIAGNOSIS — I6330 Cerebral infarction due to thrombosis of unspecified cerebral artery: Secondary | ICD-10-CM | POA: Diagnosis not present

## 2024-08-07 DIAGNOSIS — I6330 Cerebral infarction due to thrombosis of unspecified cerebral artery: Secondary | ICD-10-CM | POA: Diagnosis not present

## 2024-08-09 ENCOUNTER — Ambulatory Visit: Admission: RE | Admit: 2024-08-09 | Source: Ambulatory Visit

## 2024-08-10 DIAGNOSIS — I6330 Cerebral infarction due to thrombosis of unspecified cerebral artery: Secondary | ICD-10-CM | POA: Diagnosis not present

## 2024-08-12 DIAGNOSIS — I6330 Cerebral infarction due to thrombosis of unspecified cerebral artery: Secondary | ICD-10-CM | POA: Diagnosis not present

## 2024-08-14 DIAGNOSIS — Z79899 Other long term (current) drug therapy: Secondary | ICD-10-CM | POA: Diagnosis not present

## 2024-08-14 DIAGNOSIS — Z5181 Encounter for therapeutic drug level monitoring: Secondary | ICD-10-CM | POA: Diagnosis not present

## 2024-08-14 DIAGNOSIS — I6330 Cerebral infarction due to thrombosis of unspecified cerebral artery: Secondary | ICD-10-CM | POA: Diagnosis not present

## 2024-08-14 DIAGNOSIS — Z760 Encounter for issue of repeat prescription: Secondary | ICD-10-CM | POA: Diagnosis not present

## 2024-08-16 DIAGNOSIS — I6330 Cerebral infarction due to thrombosis of unspecified cerebral artery: Secondary | ICD-10-CM | POA: Diagnosis not present

## 2024-08-17 DIAGNOSIS — I6330 Cerebral infarction due to thrombosis of unspecified cerebral artery: Secondary | ICD-10-CM | POA: Diagnosis not present

## 2024-08-18 DIAGNOSIS — I6330 Cerebral infarction due to thrombosis of unspecified cerebral artery: Secondary | ICD-10-CM | POA: Diagnosis not present

## 2024-08-22 DIAGNOSIS — I6330 Cerebral infarction due to thrombosis of unspecified cerebral artery: Secondary | ICD-10-CM | POA: Diagnosis not present

## 2024-08-23 DIAGNOSIS — I6330 Cerebral infarction due to thrombosis of unspecified cerebral artery: Secondary | ICD-10-CM | POA: Diagnosis not present

## 2024-08-24 DIAGNOSIS — I6330 Cerebral infarction due to thrombosis of unspecified cerebral artery: Secondary | ICD-10-CM | POA: Diagnosis not present

## 2024-08-26 DIAGNOSIS — I6330 Cerebral infarction due to thrombosis of unspecified cerebral artery: Secondary | ICD-10-CM | POA: Diagnosis not present

## 2024-08-29 DIAGNOSIS — I6330 Cerebral infarction due to thrombosis of unspecified cerebral artery: Secondary | ICD-10-CM | POA: Diagnosis not present

## 2024-09-13 ENCOUNTER — Ambulatory Visit
Admission: RE | Admit: 2024-09-13 | Discharge: 2024-09-13 | Disposition: A | Discharge status: Home / Self Care | Source: Ambulatory Visit | Attending: Acute Care | Admitting: Acute Care

## 2024-09-13 DIAGNOSIS — Z87891 Personal history of nicotine dependence: Secondary | ICD-10-CM | POA: Insufficient documentation

## 2024-09-13 DIAGNOSIS — Z122 Encounter for screening for malignant neoplasm of respiratory organs: Secondary | ICD-10-CM | POA: Diagnosis present

## 2024-09-25 ENCOUNTER — Other Ambulatory Visit: Payer: Self-pay

## 2024-09-25 DIAGNOSIS — Z87891 Personal history of nicotine dependence: Secondary | ICD-10-CM

## 2024-09-25 DIAGNOSIS — F17210 Nicotine dependence, cigarettes, uncomplicated: Secondary | ICD-10-CM

## 2024-09-25 DIAGNOSIS — Z122 Encounter for screening for malignant neoplasm of respiratory organs: Secondary | ICD-10-CM

## 2024-10-03 ENCOUNTER — Ambulatory Visit (INDEPENDENT_AMBULATORY_CARE_PROVIDER_SITE_OTHER): Payer: Medicaid Other | Admitting: Vascular Surgery

## 2024-10-03 ENCOUNTER — Other Ambulatory Visit (INDEPENDENT_AMBULATORY_CARE_PROVIDER_SITE_OTHER): Payer: Medicaid Other

## 2024-10-03 VITALS — BP 126/80 | HR 98 | Resp 18 | Wt 97.4 lb

## 2024-10-03 DIAGNOSIS — I10 Essential (primary) hypertension: Secondary | ICD-10-CM

## 2024-10-03 DIAGNOSIS — I739 Peripheral vascular disease, unspecified: Secondary | ICD-10-CM

## 2024-10-03 NOTE — Progress Notes (Signed)
 "   MRN : [id number]  Donald [name]. is a 64 y.o. (Jun 10, 1961) male who presents with chief complaint of  Chief Complaint  Patient presents with   Follow-up    42yr abi follow up  .  History of Present Illness:   Discussed the use of AI scribe software for clinical note transcription with the patient, who gave verbal consent to proceed.  History of Present Illness Donald [name]. is a 64 year old male with peripheral artery disease status post aortoiliac and femoral intervention who presents for routine annual vascular surgery follow-up.  He has had no new or worsening vascular symptoms since his last visit.  He can walk without leg pain, with exercise tolerance limited by exertional dyspnea rather than lower extremity discomfort. With prolonged walking he occasionally has mild leg numbness without pain or other vascular complaints.  He is accompanied by a caregiver who assists him, especially in bad weather.    Results   Current Outpatient Medications  Medication Sig Dispense Refill   acetaminophen  (TYLENOL ) 500 MG tablet Take 2 tablets (1,000 mg total) by mouth every 8 (eight) hours.     albuterol  (VENTOLIN  HFA) 108 (90 Base) MCG/ACT inhaler Inhale 2 puffs into the lungs every 6 (six) hours as needed for wheezing or shortness of breath. 8.5 g 2   amitriptyline  (ELAVIL ) 25 MG tablet Take 25 mg by mouth at bedtime.     atorvastatin  (LIPITOR) 10 MG tablet Take 10 mg by mouth daily.     diltiazem  (CARDIZEM  CD) 240 MG 24 hr capsule Take 1 capsule (240 mg total) by mouth daily.     docusate sodium  (COLACE) 100 MG capsule Take 100 mg by mouth daily.     ELIQUIS  2.5 MG TABS tablet Take 2.5 mg by mouth 2 (two) times daily.     ferrous sulfate 325 (65 FE) MG EC tablet Take 325 mg by mouth daily at 6 (six) AM.     gabapentin  (NEURONTIN ) 100 MG capsule Take 100 mg by mouth 3 (three) times daily.     gabapentin  (NEURONTIN ) 400 MG capsule Take 400 mg by mouth 3 (three) times daily. Take  in addition to one 100 mg capsule for total 500 mg three times daily     guaifenesin (HUMIBID E) 400 MG TABS tablet Take 400 mg by mouth every 4 (four) hours.     lactose free nutrition (BOOST) LIQD Take 237 mLs by mouth 3 (three) times daily between meals.     lisinopril (ZESTRIL) 10 MG tablet Take 10 mg by mouth daily.     magnesium  oxide (MAG-OX) 400 (240 Mg) MG tablet Take 400 mg by mouth daily.     methocarbamol  (ROBAXIN ) 500 MG tablet Take 500 mg by mouth every 8 (eight) hours as needed for muscle spasms.     metoprolol  succinate (TOPROL -XL) 50 MG 24 hr tablet Take 50 mg by mouth daily. Take with or immediately following a meal.     oxyCODONE -acetaminophen  (PERCOCET) 10-325 MG tablet Take 1 tablet by mouth every 6 (six) hours as needed.     tamsulosin  (FLOMAX ) 0.4 MG CAPS capsule Take 1 capsule (0.4 mg total) by mouth daily after supper. (Patient taking differently: Take 0.4 mg by mouth 2 (two) times daily.) 90 capsule 3   Tiotropium Bromide  Monohydrate (SPIRIVA  RESPIMAT) 2.5 MCG/ACT AERS Inhale 2 puffs into the lungs daily.     traZODone  (DESYREL ) 50 MG tablet Take 50 mg by mouth at bedtime.  vitamin C  (VITAMIN C ) 250 MG tablet Take 1 tablet (250 mg total) by mouth 2 (two) times daily.     vitamin D3 (CHOLECALCIFEROL ) 25 MCG tablet Take 1 tablet (1,000 Units total) by mouth daily. 30 tablet 0   No current facility-administered medications for this visit.    Past Medical History:  Diagnosis Date   Depression    Emphysema of lung (HCC)    Essential hypertension    History of chicken pox    Hyponatremia    Insomnia    Low weight    MRSA nasal colonization    Neuropathy    bilateral lower legs   Peripheral vascular disease    Pneumonia    Prostate disease    Seasonal allergies    Seizures (HCC)    Sensory ataxia    Sepsis (HCC)    Stroke (HCC)    Wears dentures    full upper    Past Surgical History:  Procedure Laterality Date   ABDOMINAL AORTIC ENDOVASCULAR STENT  GRAFT Bilateral 03/16/2019   Procedure: AORTIC ILIAC STENT;  Surgeon: Marea Selinda RAMAN, MD;  Location: [facility];  Service: Vascular;  Laterality: Bilateral;   AMPUTATION TOE Right 06/16/2019   Procedure: 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;  Surgeon: Ashley Soulier, DPM;  Location: [facility];  Service: Podiatry;  Laterality: Right;   CATARACT EXTRACTION W/PHACO Left 05/18/2018   Procedure: CATARACT EXTRACTION PHACO AND INTRAOCULAR LENS PLACEMENT (IOC) LEFT;  Surgeon: Mittie Gaskin, MD;  Location: [facility];  Service: Ophthalmology;  Laterality: Left;   COLONOSCOPY WITH PROPOFOL  N/A 02/25/2016   Procedure: COLONOSCOPY WITH PROPOFOL ;  Surgeon: Rogelia Copping, MD;  Location: [facility];  Service: Endoscopy;  Laterality: N/A;   ENDARTERECTOMY FEMORAL Bilateral 03/16/2019   Procedure: ENDARTERECTOMY FEMORAL;  Surgeon: Marea Selinda RAMAN, MD;  Location: [facility];  Service: Vascular;  Laterality: Bilateral;   EYE SURGERY  2015   Cataract   FRACTURE SURGERY     ORIF wrist   INTRAMEDULLARY (IM) NAIL INTERTROCHANTERIC Left 10/29/2023   Procedure: INTRAMEDULLARY (IM) NAIL INTERTROCHANTERIC;  Surgeon: Tobie Priest, MD;  Location: [facility];  Service: Orthopedics;  Laterality: Left;   LOWER EXTREMITY ANGIOGRAPHY Left 03/13/2019   Procedure: Lower Extremity Angiography (LEFT);  Surgeon: Marea Selinda RAMAN, MD;  Location: [facility];  Service: Cardiovascular;  Laterality: Left;   VASCULAR SURGERY     7 stents in legs     Social History[1]    Family History  Problem Relation Age of Onset   Ulcers Father        bleeding   Cancer Mother        Breast     Allergies[2]   REVIEW OF SYSTEMS (Negative unless checked)   Constitutional: [] Weight loss  [] Fever  [] Chills Cardiac: [] Chest pain   [] Chest pressure   [] Palpitations   [] Shortness of breath when laying flat   [] Shortness of breath at rest   [x] Shortness of breath with exertion. Vascular:  [x] Pain in legs with walking   [] Pain in legs at rest    [] Pain in legs when laying flat   [] Claudication   [] Pain in feet when walking  [] Pain in feet at rest  [] Pain in feet when laying flat   [] History of DVT   [] Phlebitis   [] Swelling in legs   [] Varicose veins   [] Non-healing ulcers Pulmonary:   [] Uses home oxygen   [] Productive cough   [] Hemoptysis   [] Wheeze  [] COPD   [] Asthma Neurologic:  [] Dizziness  []   Blackouts   [] Seizures   [] History of stroke   [] History of TIA  [] Aphasia   [] Temporary blindness   [] Dysphagia   [] Weakness or numbness in arms   [x] Weakness or numbness in legs Musculoskeletal:  [] Arthritis   [] Joint swelling   [] Joint pain   [] Low back pain Hematologic:  [] Easy bruising  [] Easy bleeding   [] Hypercoagulable state   [] Anemic   Gastrointestinal:  [] Blood in stool   [] Vomiting blood  [x] Gastroesophageal reflux/heartburn   [] Abdominal pain Genitourinary:  [] Chronic kidney disease   [] Difficult urination  [] Frequent urination  [] Burning with urination   [] Hematuria Skin:  [] Rashes   [] Ulcers   [] Wounds Psychological:  [] History of anxiety   []  History of major depression.   Physical Examination  BP 126/80 (BP Location: Right Arm)   Pulse 98   Resp 18   Wt 97 lb 6.4 oz (44.2 kg)   BMI 14.81 kg/m  Gen:   NAD, thin and frail. Appears older than stated age. Head: [place]/AT, No temporalis wasting. Ear/Nose/Throat: Hearing grossly intact, nares w/o erythema or drainage Eyes: Conjunctiva clear. Sclera non-icteric Neck: Supple.  Trachea midline Pulmonary:  Good air movement, no use of accessory muscles.  Cardiac: tachycardic Vascular:  Vessel Right Left  Radial Palpable Palpable                          PT Palpable Palpable  DP Palpable Palpable    Musculoskeletal: M/S 5/5 throughout.  No deformity or atrophy. No edema. Neurologic: Sensation grossly intact in extremities.  Symmetrical.  Speech is fluent.  Psychiatric: Judgment intact, Mood & affect appropriate for pt's clinical situation. Dermatologic: No rashes or  ulcers noted.  No cellulitis or open wounds.  Physical Exam     Labs No results found for this or any previous visit (from the past 2160 hours).  Radiology CT CHEST LUNG CA SCREEN LOW DOSE W/O CM Result Date: 09/25/2024 CLINICAL DATA:  64 year old male current smoker with 46 pack year smoking history EXAM: CT CHEST WITHOUT CONTRAST LOW-DOSE FOR LUNG CANCER SCREENING TECHNIQUE: Multidetector CT imaging of the chest was performed following the standard protocol without IV contrast. RADIATION DOSE REDUCTION: This exam was performed according to the departmental dose-optimization program which includes automated exposure control, adjustment of the mA and/or kV according to patient size and/or use of iterative reconstruction technique. COMPARISON:  08/09/2023 screening chest CT FINDINGS: Cardiovascular: Normal heart size. No significant pericardial effusion/thickening. Left anterior descending and right coronary atherosclerosis. Atherosclerotic nonaneurysmal thoracic aorta. Normal caliber pulmonary arteries. Mediastinum/Nodes: No significant thyroid  nodules. Unremarkable esophagus. No axillary adenopathy. Mildly enlarged 1.1 cm lower right paratracheal node on series 2/image 25 is stable and compatible with benign reactive etiology. No new pathologically enlarged mediastinal nodes. No discrete hilar adenopathy on these noncontrast images. Lungs/Pleura: No pneumothorax. No pleural effusion. Moderate to severe paraseptal and centrilobular emphysema with diffuse bronchial wall thickening. No acute consolidative airspace disease or lung masses. Chronic thick curvilinear parenchymal bands at both lung bases and at the right lung apex. Previously visualized superior segment right lower lobe pulmonary nodules have resolved. Extensive calcified bilateral pulmonary granulomas are unchanged. No new significant pulmonary nodules. Upper abdomen: Asymmetric left renal atrophy. Partially visualized abdominal aortic stent  graft. Musculoskeletal: No aggressive appearing focal osseous lesions. Mild thoracic spondylosis. Chronic severe T5 and T6 vertebral compression fractures. Chronic mild T10 vertebral compression fracture. IMPRESSION: 1. Lung-RADS 1, negative. Continue annual screening with low-dose chest CT without contrast in 12  months. 2. Two-vessel coronary atherosclerosis. 3. Aortic Atherosclerosis (ICD10-I70.0) and Emphysema (ICD10-J43.9). Electronically Signed   By: Selinda DELENA Blue M.D.   On: 09/25/2024 11:12    Assessment/Plan   Assessment & Plan Peripheral artery disease status post aortoiliac and femoral intervention Several years post-intervention with stable lower extremity perfusion and no recurrent ischemia. Mild numbness with prolonged ambulation, exercise limited by claudication. Durable limb salvage and vascular patency achieved. ABIs 1.02 on the right and 1.04 on the left - Continued annual vascular follow-up. - Assessed lower extremity perfusion and symptoms at yearly visits.  Essential hypertension blood pressure control important in reducing the progression of atherosclerotic disease. On appropriate oral medications.   Selinda Gu, MD  10/03/2024 2:19 PM    This note was created with Dragon medical transcription system.  Any errors from dictation are purely unintentional    [1]  Social History Tobacco Use   Smoking status: Former    Current packs/day: 0.50    Average packs/day: 0.5 packs/day for 41.7 years (20.8 ttl pk-yrs)    Types: Cigarettes    Start date: 01/26/1983   Smokeless tobacco: Former    Quit date: 08/31/2016   Tobacco comments:    Previously quit in 09/2016 on Chantix  until 09/2017  Vaping Use   Vaping status: Never Used  Substance Use Topics   Alcohol use: Not Currently    Alcohol/week: 0.0 standard drinks of alcohol    Comment: occ   Drug use: No  [2]  Allergies Allergen Reactions   Lidocaine  Other (See Comments)    unknown   "

## 2024-10-03 NOTE — Assessment & Plan Note (Signed)
 blood pressure control important in reducing the progression of atherosclerotic disease. On appropriate oral medications.

## 2024-10-05 LAB — VAS US ABI WITH/WO TBI
Left ABI: 1.04
Right ABI: 1.02

## 2025-10-02 ENCOUNTER — Encounter (INDEPENDENT_AMBULATORY_CARE_PROVIDER_SITE_OTHER)

## 2025-10-02 ENCOUNTER — Ambulatory Visit (INDEPENDENT_AMBULATORY_CARE_PROVIDER_SITE_OTHER): Admitting: Vascular Surgery
